# Patient Record
Sex: Female | Born: 1950
Health system: Southern US, Community
[De-identification: ages and names within clinical notes are randomized; demographics above are authoritative.]

## PROBLEM LIST (undated history)

## (undated) DIAGNOSIS — Z972 Presence of dental prosthetic device (complete) (partial): Secondary | ICD-10-CM

## (undated) DIAGNOSIS — C50412 Malignant neoplasm of upper-outer quadrant of left female breast: Secondary | ICD-10-CM

## (undated) DIAGNOSIS — Z98811 Dental restoration status: Secondary | ICD-10-CM

## (undated) DIAGNOSIS — E039 Hypothyroidism, unspecified: Secondary | ICD-10-CM

## (undated) DIAGNOSIS — R7303 Prediabetes: Secondary | ICD-10-CM

## (undated) DIAGNOSIS — S80811A Abrasion, right lower leg, initial encounter: Secondary | ICD-10-CM

## (undated) DIAGNOSIS — H269 Unspecified cataract: Secondary | ICD-10-CM

## (undated) DIAGNOSIS — Z923 Personal history of irradiation: Secondary | ICD-10-CM

## (undated) DIAGNOSIS — E2740 Unspecified adrenocortical insufficiency: Secondary | ICD-10-CM

## (undated) HISTORY — PX: ABDOMINOPLASTY: SHX5355

## (undated) HISTORY — PX: BUNIONECTOMY: SHX129

## (undated) HISTORY — PX: BALLOON SINUPLASTY: SHX5740

## (undated) HISTORY — PX: RHINOPLASTY: SUR1284

## (undated) HISTORY — PX: SEPTOPLASTY: SHX2393

## (undated) HISTORY — DX: Malignant neoplasm of upper-outer quadrant of left female breast: C50.412

## (undated) HISTORY — PX: HAMMER TOE SURGERY: SHX385

---

## 1997-09-25 ENCOUNTER — Other Ambulatory Visit: Admission: RE | Admit: 1997-09-25 | Discharge: 1997-09-25 | Payer: Self-pay | Admitting: Gynecology

## 1998-09-29 ENCOUNTER — Other Ambulatory Visit: Admission: RE | Admit: 1998-09-29 | Discharge: 1998-09-29 | Payer: Self-pay | Admitting: Gynecology

## 1999-09-27 ENCOUNTER — Other Ambulatory Visit: Admission: RE | Admit: 1999-09-27 | Discharge: 1999-09-27 | Payer: Self-pay | Admitting: Gynecology

## 2000-09-26 ENCOUNTER — Other Ambulatory Visit: Admission: RE | Admit: 2000-09-26 | Discharge: 2000-09-26 | Payer: Self-pay | Admitting: Gynecology

## 2001-01-15 ENCOUNTER — Ambulatory Visit (HOSPITAL_COMMUNITY): Admission: RE | Admit: 2001-01-15 | Discharge: 2001-01-15 | Payer: Self-pay | Admitting: Gastroenterology

## 2001-09-26 ENCOUNTER — Other Ambulatory Visit: Admission: RE | Admit: 2001-09-26 | Discharge: 2001-09-26 | Payer: Self-pay | Admitting: Gynecology

## 2002-06-20 ENCOUNTER — Encounter: Admission: RE | Admit: 2002-06-20 | Discharge: 2002-06-20 | Payer: Self-pay | Admitting: Orthopaedic Surgery

## 2002-06-20 ENCOUNTER — Encounter: Payer: Self-pay | Admitting: Orthopaedic Surgery

## 2002-07-04 ENCOUNTER — Encounter: Admission: RE | Admit: 2002-07-04 | Discharge: 2002-07-04 | Payer: Self-pay | Admitting: Orthopaedic Surgery

## 2002-07-04 ENCOUNTER — Encounter: Payer: Self-pay | Admitting: Orthopaedic Surgery

## 2002-07-18 ENCOUNTER — Encounter: Admission: RE | Admit: 2002-07-18 | Discharge: 2002-07-18 | Payer: Self-pay | Admitting: Orthopaedic Surgery

## 2002-07-18 ENCOUNTER — Encounter: Payer: Self-pay | Admitting: Orthopaedic Surgery

## 2003-01-21 ENCOUNTER — Encounter: Admission: RE | Admit: 2003-01-21 | Discharge: 2003-01-21 | Payer: Self-pay | Admitting: Orthopaedic Surgery

## 2003-01-21 ENCOUNTER — Encounter: Payer: Self-pay | Admitting: Orthopaedic Surgery

## 2003-01-21 ENCOUNTER — Encounter: Payer: Self-pay | Admitting: Radiology

## 2003-06-03 ENCOUNTER — Encounter: Admission: RE | Admit: 2003-06-03 | Discharge: 2003-06-03 | Payer: Self-pay | Admitting: Orthopaedic Surgery

## 2004-02-01 ENCOUNTER — Other Ambulatory Visit: Admission: RE | Admit: 2004-02-01 | Discharge: 2004-02-01 | Payer: Self-pay | Admitting: Family Medicine

## 2007-01-30 ENCOUNTER — Ambulatory Visit (HOSPITAL_COMMUNITY): Admission: RE | Admit: 2007-01-30 | Discharge: 2007-01-30 | Payer: Self-pay | Admitting: Orthopedic Surgery

## 2010-05-21 ENCOUNTER — Encounter: Payer: Self-pay | Admitting: Orthopaedic Surgery

## 2010-09-16 NOTE — Op Note (Signed)
. [facility]  Patient:    Christina [name], Christina [name] Visit Number: [id number] MRN: [id number]          Service Type: END Location: ENDO Attending Physician:  Charna Elizabeth Dictated by:   Anselmo Rod, M.D. Proc. Date: 01/15/01 Admit Date:  01/15/2001 Discharge Date: 01/15/2001   CC:         Juan H. Lily Peer, M.D.   Operative Report  DATE OF BIRTH:  1950/10/15  REFERRING PHYSICIAN:  Gaetano Hawthorne. Lily Peer, M.D.  PROCEDURE PERFORMED:  Colonoscopy.  ENDOSCOPIST:  Anselmo Rod, M.D.  INSTRUMENT USED:  Olympus video colonoscope.  INDICATIONS FOR PROCEDURE:  Guaiac positive stool and a family history of colitis in a 60 year old white female rule out colonic polyps, masses, hemorrhoids, etc.  PREPROCEDURE PREPARATION:  Informed consent was procured from the patient. The patient was fasted for eight hours prior to the procedure and prepped with a bottle of magnesium citrate and a gallon of NuLytely the night prior to the procedure.  PREPROCEDURE PHYSICAL:  The patient had stable vital signs.  Neck supple. Chest clear to auscultation.  S1, S2 regular.  Abdomen soft with normal abdominal bowel sounds.  DESCRIPTION OF PROCEDURE:  The patient was placed in the left lateral decubitus position and sedated with 100 mg of Demerol and 10 mg of Versed intravenously.  Once the patient was adequately sedated and maintained on low-flow oxygen and continuous cardiac monitoring, the Olympus video colonoscope was advanced from the rectum to the cecum without difficulty. The entire colonic mucosa up to the cecum and terminal ileum appeared healthy. There were no erosions, ulcerations, masses, polyps or diverticula seen, Small internal hemorrhoids were appreciated on retroflex view.  IMPRESSION: 1. Normal colonoscopy up to terminal ileum. 2. Small nonbleeding internal hemorrhoids.  RECOMMENDATIONS:  Outpatient follow-up is advised for repeat guaiac  testing. Further recommendations will be made at that time.  Dictated by:   Anselmo Rod, M.D. Attending Physician:  Charna Elizabeth DD:  01/15/01 TD:  01/16/01 Job: [id number]

## 2012-11-28 ENCOUNTER — Emergency Department (HOSPITAL_COMMUNITY)
Admission: EM | Admit: 2012-11-28 | Discharge: 2012-11-28 | Disposition: A | Discharge status: Home / Self Care | Payer: Managed Care, Other (non HMO) | Attending: Emergency Medicine | Admitting: Emergency Medicine

## 2012-11-28 ENCOUNTER — Encounter (HOSPITAL_COMMUNITY): Payer: Self-pay | Admitting: *Deleted

## 2012-11-28 DIAGNOSIS — E079 Disorder of thyroid, unspecified: Secondary | ICD-10-CM | POA: Insufficient documentation

## 2012-11-28 DIAGNOSIS — IMO0001 Reserved for inherently not codable concepts without codable children: Secondary | ICD-10-CM | POA: Insufficient documentation

## 2012-11-28 DIAGNOSIS — R238 Other skin changes: Secondary | ICD-10-CM | POA: Insufficient documentation

## 2012-11-28 DIAGNOSIS — Y929 Unspecified place or not applicable: Secondary | ICD-10-CM | POA: Insufficient documentation

## 2012-11-28 DIAGNOSIS — Y9389 Activity, other specified: Secondary | ICD-10-CM | POA: Insufficient documentation

## 2012-11-28 DIAGNOSIS — Z79899 Other long term (current) drug therapy: Secondary | ICD-10-CM | POA: Insufficient documentation

## 2012-11-28 DIAGNOSIS — M7989 Other specified soft tissue disorders: Secondary | ICD-10-CM | POA: Insufficient documentation

## 2012-11-28 DIAGNOSIS — L089 Local infection of the skin and subcutaneous tissue, unspecified: Secondary | ICD-10-CM

## 2012-11-28 DIAGNOSIS — Z862 Personal history of diseases of the blood and blood-forming organs and certain disorders involving the immune mechanism: Secondary | ICD-10-CM | POA: Insufficient documentation

## 2012-11-28 DIAGNOSIS — S61409A Unspecified open wound of unspecified hand, initial encounter: Secondary | ICD-10-CM | POA: Insufficient documentation

## 2012-11-28 DIAGNOSIS — Z8639 Personal history of other endocrine, nutritional and metabolic disease: Secondary | ICD-10-CM | POA: Insufficient documentation

## 2012-11-28 HISTORY — DX: Unspecified adrenocortical insufficiency: E27.40

## 2012-11-28 MED ORDER — SODIUM CHLORIDE 0.9 % IV SOLN
Freq: Once | INTRAVENOUS | Status: AC
  Administered 2012-11-28: 09:00:00 via INTRAVENOUS

## 2012-11-28 MED ORDER — SODIUM CHLORIDE 0.9 % IV SOLN
3.0000 g | Freq: Once | INTRAVENOUS | Status: AC
  Administered 2012-11-28: 3 g via INTRAVENOUS
  Filled 2012-11-28: qty 3

## 2012-11-28 NOTE — ED Notes (Signed)
Pt reports cat bite x 2 days ago.  Pt reports she was playing with her cat and bit her in her R hand.  Pt reports R arm did not start swelling until after lunch yesterday.  Swelling, redness and warmth noted to R arm.  Pt denies unknown fever at this time.

## 2012-11-28 NOTE — ED Provider Notes (Signed)
CSN: [id number]     Arrival date & time 11/28/12  0808 History     First MD Initiated Contact with Patient 11/28/12 [phone]     Chief Complaint  Patient presents with  . Animal Bite   (Consider location/radiation/quality/duration/timing/severity/associated sxs/prior Treatment) Patient is a 62 y.o. female presenting with animal bite. The history is provided by the patient.  Animal Bite Associated symptoms: no fever and no rash   pt c/o cat bite to dorsum right hand 2 days ago at home. States indoor cats, otherwise acting healthy/normal. She was playing w then. Puncture wound just distal to wrist on dorsum hand. Since then noted increased swelling and spreading redness. No nv. No fever or chills. Went to urgent care yesterday, given rx augmentin, since then say ?sl worse or no better. States otherwise recent health at baseline. No hx diabetes. Pt has had tetanus imm within past 2-3 yrs.     Past Medical History  Diagnosis Date  . Thyroid disease   . Adrenal insufficiency    History reviewed. No pertinent past surgical history. No family history on file. History  Substance Use Topics  . Smoking status: Never Smoker   . Smokeless tobacco: Not on file  . Alcohol Use: Yes     Comment: occa   OB History   Grav Para Term Preterm Abortions TAB SAB Ect Mult Living                 Review of Systems  Constitutional: Negative for fever and chills.  Gastrointestinal: Negative for nausea and vomiting.  Musculoskeletal: Negative for back pain.  Skin: Positive for wound. Negative for rash.    Allergies  Sulfa antibiotics  Home Medications   Current Outpatient Rx  Name  Route  Sig  Dispense  Refill  . amoxicillin-clavulanate (AUGMENTIN) 875-125 MG per tablet   Oral   Take 1 tablet by mouth 2 (two) times daily. For 10 days         . cetirizine (ZYRTEC) 10 MG tablet   Oral   Take 10 mg by mouth daily.         Marland Kitchen liothyronine (CYTOMEL) 5 MCG tablet   Oral   Take 5 mcg by mouth  daily.         . medroxyPROGESTERone (PROVERA) 10 MG tablet   Oral   Take 10 mg by mouth daily.         . montelukast (SINGULAIR) 10 MG tablet   Oral   Take 10 mg by mouth at bedtime.         Marland Kitchen oxyCODONE-acetaminophen (PERCOCET/ROXICET) 5-325 MG per tablet   Oral   Take 1 tablet by mouth every 4 (four) hours as needed for pain.         . pseudoephedrine (SUDAFED) 30 MG tablet   Oral   Take 30-60 mg by mouth every 4 (four) hours as needed for congestion.          BP 111/60  Pulse 96  Temp(Src) 97.4 F (36.3 C) (Oral)  Resp 16  SpO2 100% Physical Exam  Nursing note and vitals reviewed. Constitutional: She appears well-developed and well-nourished. No distress.  Eyes: Conjunctivae are normal. No scleral icterus.  Neck: Neck supple. No tracheal deviation present.  Cardiovascular: Normal rate.   Pulmonary/Chest: Effort normal. No respiratory distress.  Abdominal: Normal appearance.  Musculoskeletal:  Swelling and redness to dorsum right hand extending just proximal to wrist. Sl clear drainage from puncture wound dorsum hand. No lymphangitis.  No l/a.    Neurological: She is alert.  Right hand nvi.   Skin: Skin is warm and dry. No rash noted.  Psychiatric: She has a normal mood and affect.    ED Course   Procedures (including critical care time)    MDM  Iv ns. unasyn iv.  Discussed pt with Dr Melvyn Novas, on call for hand, he will see in office now.  Reviewed nursing notes and prior charts for additional history.    Suzi Roots, MD 11/28/12 [phone]

## 2012-11-28 NOTE — ED Notes (Signed)
MD at bedside. steinl  

## 2013-01-17 DIAGNOSIS — J0191 Acute recurrent sinusitis, unspecified: Secondary | ICD-10-CM | POA: Insufficient documentation

## 2013-03-17 ENCOUNTER — Ambulatory Visit (INDEPENDENT_AMBULATORY_CARE_PROVIDER_SITE_OTHER): Payer: Managed Care, Other (non HMO)

## 2013-03-17 ENCOUNTER — Ambulatory Visit (INDEPENDENT_AMBULATORY_CARE_PROVIDER_SITE_OTHER): Payer: Managed Care, Other (non HMO) | Admitting: Podiatry

## 2013-03-17 DIAGNOSIS — M779 Enthesopathy, unspecified: Secondary | ICD-10-CM

## 2013-03-17 DIAGNOSIS — R52 Pain, unspecified: Secondary | ICD-10-CM

## 2013-03-17 DIAGNOSIS — M21611 Bunion of right foot: Secondary | ICD-10-CM

## 2013-03-17 DIAGNOSIS — M21619 Bunion of unspecified foot: Secondary | ICD-10-CM

## 2013-03-17 MED ORDER — TRIAMCINOLONE ACETONIDE 10 MG/ML IJ SUSP
5.0000 mg | Freq: Once | INTRAMUSCULAR | Status: AC
  Administered 2013-03-17: 5 mg via INTRA_ARTICULAR

## 2013-03-17 NOTE — Progress Notes (Signed)
N- SORE L-RT FOOT  D-2 MONTHS O-SLOWLY C-BETTER A-SHOES T-TRIM

## 2013-03-17 NOTE — Progress Notes (Signed)
N-SORE L-LT FOOT GREAT TOENAIL D-2 MONTHS O-SLOWLY C-WORSE A- PRESSURE T-[name]

## 2013-03-19 NOTE — Progress Notes (Signed)
Subjective:     Patient ID: Christina [name], female   DOB: 1950-11-15, 62 y.o.   MRN: [id number]  Foot Pain  Toe Pain    patient states I'm having pain in both feet with inability to walk comfortably on the outside of his right foot and my left big toe has been bothering me. States it's been going on for about a 6 months and getting gradually worse over the last 2 months   Review of Systems  All other systems reviewed and are negative.       Objective:   Physical Exam  Nursing note and vitals reviewed. Constitutional: She is oriented to person, place, and time.  Cardiovascular: Intact distal pulses.   Musculoskeletal: Normal range of motion.  Neurological: She is oriented to person, place, and time.  Skin: Skin is warm.   neurovascular status intact and muscle strength adequate. I noted some 5 there is an inflamed lesion it's painful when pressed with a loose intact core and fluid buildup. 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 At this time I did an injection of the capsule fifth MPJ 3 mg dexamethasone 5 g Xylocaine debrided the lesion and applied padding to the left hallux. Will be seen back when symptomatic

## 2013-05-08 ENCOUNTER — Ambulatory Visit: Payer: Managed Care, Other (non HMO) | Admitting: Podiatry

## 2013-05-22 ENCOUNTER — Encounter: Payer: Self-pay | Admitting: Podiatry

## 2013-05-22 ENCOUNTER — Ambulatory Visit (INDEPENDENT_AMBULATORY_CARE_PROVIDER_SITE_OTHER): Payer: Managed Care, Other (non HMO) | Admitting: Podiatry

## 2013-05-22 VITALS — BP 127/69 | HR 82 | Resp 16

## 2013-05-22 DIAGNOSIS — L03119 Cellulitis of unspecified part of limb: Secondary | ICD-10-CM

## 2013-05-22 DIAGNOSIS — L02619 Cutaneous abscess of unspecified foot: Secondary | ICD-10-CM

## 2013-05-22 DIAGNOSIS — M21619 Bunion of unspecified foot: Secondary | ICD-10-CM

## 2013-05-22 MED ORDER — CEPHALEXIN 500 MG PO CAPS: 500.0000 mg | ORAL_CAPSULE | Freq: Two times a day (BID) | ORAL | Status: DC

## 2013-05-22 NOTE — Progress Notes (Signed)
Subjective:     Patient ID: Christina [name], female   DOB: 01/29/1951, 63 y.o.   MRN: [id number]  HPI patient states that her foot improved for several months but over the last few weeks has become painful again. Points to the outside of the right foot around the fifth metatarsal head where there is a small opening and shoe gear is very irritative for her to wear   Review of Systems     Objective:   Physical Exam Neurovascular status intact with no health history changes noted and redness and inflammation around the fifth metatarsal head right with a small approximate 3 x 3 mm opening on the lateral side of the fifth metatarsal. No proximal edema erythema or drainage noted    Assessment:     Tailor's bunion deformity with inflammation of the persistent nature and small cracking skin which may have localized infection    Plan:     Patient is due to go to [place] in 2 weeks and we are going to try to just get her better prior to that with consideration for surgery when she returns from her trip. Today I did apply Iodosorb and cushioning to the area along with padding to reduce all stress and I place her on cephalexin 500 mg twice a day for 10 days with return prior to leaving for [place] and we will plan to fix the bunion after she returns

## 2013-05-27 ENCOUNTER — Telehealth: Payer: Self-pay | Admitting: *Deleted

## 2013-05-27 NOTE — Telephone Encounter (Signed)
Pt states corn area is tender with a hard cone in the center.  I told pt to continue the antibiotic, use epsom salt soaks and pad off until Monday's appt.  Pt states it does feel better after the soaks, but Neosporin makes it look worse.

## 2013-06-02 ENCOUNTER — Ambulatory Visit (INDEPENDENT_AMBULATORY_CARE_PROVIDER_SITE_OTHER): Payer: Managed Care, Other (non HMO) | Admitting: Podiatry

## 2013-06-02 ENCOUNTER — Telehealth: Payer: Self-pay | Admitting: *Deleted

## 2013-06-02 ENCOUNTER — Ambulatory Visit (INDEPENDENT_AMBULATORY_CARE_PROVIDER_SITE_OTHER): Payer: Managed Care, Other (non HMO)

## 2013-06-02 ENCOUNTER — Encounter: Payer: Self-pay | Admitting: Podiatry

## 2013-06-02 VITALS — BP 124/72 | HR 80 | Resp 16 | Ht 63.0 in | Wt 106.0 lb

## 2013-06-02 DIAGNOSIS — R52 Pain, unspecified: Secondary | ICD-10-CM

## 2013-06-02 DIAGNOSIS — M21619 Bunion of unspecified foot: Secondary | ICD-10-CM

## 2013-06-02 NOTE — Progress Notes (Signed)
Pt states the right foot is painful and very prominent, and she wants it off.

## 2013-06-02 NOTE — Progress Notes (Signed)
Subjective:     Patient ID: Christina [name], female   DOB: 1951-01-17, 63 y.o.   MRN: [id number]  HPI patient presents stating I am ready to get this right foot fixed. States she has not had any drainage or swelling around the fifth metatarsal head but it continues to be sore with any type of shoe gear   Review of Systems     Objective:   Physical Exam Neurovascular status intact with no health history changes noted and irritation around the fifth metatarsal head right with a small crusted area with no drainage or odor noted    Assessment:     Chronic tailor's bunion deformity right that remains painful with any type of pressure    Plan:     We x-rayed and reviewed and recommended metatarsal osteotomy fifth right. Allowed patient to read consent form explaining all possible complications and everything as listed in the consent form that can occur with a procedure such as this. She understands the told recovery takes about 6 months and we can not get any long-term guarantees as far as healing or correction or reduction of pressure. Today cam walker dispensed with all instructions on usage and patient signed consent form

## 2013-06-02 NOTE — Telephone Encounter (Signed)
Pt states would like 06/12/2013 surgery day.  I confirmed the date with the pt and informed it would be about 1230pm and be there 1 hour prior.  Pt states she's only having local anesthesia does she have to be without food after midnight.  I told her to ask the [facility] nurse that calls 24 - 48 hours before her surgery.

## 2013-06-03 ENCOUNTER — Telehealth: Payer: Self-pay | Admitting: *Deleted

## 2013-06-03 NOTE — Telephone Encounter (Signed)
Informed pt she could eat a light snack about 4 hours before surgery, since having only local anesthesia pr Dr Paulla Dolly.

## 2013-06-03 NOTE — Telephone Encounter (Signed)
Dr Paulla Dolly stated pt is having local anesthesia only, so can have a light snack.  Recommendation called to pt.

## 2013-06-03 NOTE — Telephone Encounter (Signed)
She will be fine to eat prior to surgery

## 2013-06-12 ENCOUNTER — Encounter: Payer: Self-pay | Admitting: Podiatry

## 2013-06-12 DIAGNOSIS — M21619 Bunion of unspecified foot: Secondary | ICD-10-CM

## 2013-06-17 NOTE — Progress Notes (Signed)
1) Metatarsal Osteotomy with screw fixation 5th met right foot

## 2013-06-19 ENCOUNTER — Ambulatory Visit (INDEPENDENT_AMBULATORY_CARE_PROVIDER_SITE_OTHER): Payer: Managed Care, Other (non HMO) | Admitting: Podiatry

## 2013-06-19 ENCOUNTER — Ambulatory Visit (INDEPENDENT_AMBULATORY_CARE_PROVIDER_SITE_OTHER): Payer: Managed Care, Other (non HMO)

## 2013-06-19 ENCOUNTER — Encounter: Payer: Self-pay | Admitting: Podiatry

## 2013-06-19 VITALS — BP 131/68 | HR 78 | Resp 12

## 2013-06-19 DIAGNOSIS — Z9889 Other specified postprocedural states: Secondary | ICD-10-CM

## 2013-06-19 DIAGNOSIS — M21619 Bunion of unspecified foot: Secondary | ICD-10-CM

## 2013-06-19 NOTE — Progress Notes (Signed)
Subjective:     Patient ID: Christina [name], female   DOB: 06/23/1950, 62 y.o.   MRN: [id number]  HPI patient presents stating I'm doing fine on my right foot and I do admit I walk without my boot for periods of time   Review of Systems     Objective:   Physical Exam Neurovascular status intact with negative Homans sign noted and well-healing surgical site fifth metatarsal right foot incision site is healing well with wound edges coalescing well and no drainage noted    Assessment:     Tailor's bunion deformity right which is healing well 8 days post sugery    Plan:     Reviewed x-rays and explained the importance of immobilization and reapplied sterile dressing. Reappoint 3 weeks and less need to be seen earlier

## 2013-06-26 ENCOUNTER — Telehealth: Payer: Self-pay | Admitting: *Deleted

## 2013-06-26 NOTE — Telephone Encounter (Signed)
SCHEDULED FOR Monday @ 10:45AM

## 2013-06-26 NOTE — Telephone Encounter (Signed)
Pt states had been doing well, then yesterday had a sharp pain in one spot at the end of the day.  Pt states went back into the surgical boot and felt better.  Pt states may have twisted foot while walking the dog also.  I told pt to remain in the surgical boot, and I would refer to schedulers for a 06/30/2013 appt with Dr Paulla Dolly.  Pt agreed.

## 2013-06-27 ENCOUNTER — Telehealth: Payer: Self-pay | Admitting: *Deleted

## 2013-06-27 NOTE — Telephone Encounter (Signed)
Pt states she's been in the surgical boot, and then a regular shoe and feel okay, asked if needed to keep the 07/02/2103 appt with Dr Paulla Dolly.  I left a message to keep the appt, but if all was well call an cancel early Monday morning.

## 2013-06-30 ENCOUNTER — Ambulatory Visit (INDEPENDENT_AMBULATORY_CARE_PROVIDER_SITE_OTHER): Payer: Managed Care, Other (non HMO)

## 2013-06-30 ENCOUNTER — Encounter: Payer: Self-pay | Admitting: Podiatry

## 2013-06-30 ENCOUNTER — Ambulatory Visit (INDEPENDENT_AMBULATORY_CARE_PROVIDER_SITE_OTHER): Payer: Managed Care, Other (non HMO) | Admitting: Podiatry

## 2013-06-30 VITALS — BP 123/65 | HR 78 | Resp 16

## 2013-06-30 DIAGNOSIS — Z9889 Other specified postprocedural states: Secondary | ICD-10-CM

## 2013-06-30 DIAGNOSIS — M21619 Bunion of unspecified foot: Secondary | ICD-10-CM

## 2013-06-30 NOTE — Progress Notes (Signed)
Subjective:     Patient ID: Christina [name], female   DOB: 01-13-1951, 63 y.o.   MRN: [id number]  HPI patient points to right foot stating he was hurting her quite a bit over the weekend and she was worried that she may have done something to it. Patient admits that she's been quite active on her right foot during the healing process and is now 4 weeks after having met osteotomy fifth right   Review of Systems     Objective:   Physical Exam Neurovascular status is intact with mild edema around the fifth metatarsal right indicating excessive activity. No erythema no drainage or breakdown of incision site    Assessment:     Patient's doing well with probable excessive bike activity with possible mild motion of the bone    Plan:     X-rays reviewed and advised on continued immobilization being careful with this and gradual healing should occur. It will probably swell for another 4-8 weeks and she will be careful during that period and we'll not go without some form of shoe or boot. Reappoint to check in 4 weeks or earlier if any issues should occur

## 2013-07-10 ENCOUNTER — Encounter: Payer: Managed Care, Other (non HMO) | Admitting: Podiatry

## 2013-07-24 ENCOUNTER — Ambulatory Visit (INDEPENDENT_AMBULATORY_CARE_PROVIDER_SITE_OTHER): Payer: Managed Care, Other (non HMO) | Admitting: Podiatry

## 2013-07-24 ENCOUNTER — Encounter: Payer: Self-pay | Admitting: Podiatry

## 2013-07-24 ENCOUNTER — Ambulatory Visit (INDEPENDENT_AMBULATORY_CARE_PROVIDER_SITE_OTHER): Payer: Managed Care, Other (non HMO)

## 2013-07-24 DIAGNOSIS — M21619 Bunion of unspecified foot: Secondary | ICD-10-CM

## 2013-07-24 DIAGNOSIS — Z9889 Other specified postprocedural states: Secondary | ICD-10-CM

## 2013-07-24 NOTE — Progress Notes (Signed)
Subjective:     Patient ID: Christina [name], female   DOB: January 18, 1951, 63 y.o.   MRN: [id number]  HPI patient states my right foot is doing okay with some swelling and achiness at the end of the day outside into active but I am riding horses and doing everything I want   Review of Systems     Objective:   Physical Exam Neurovascular status intact with well-healing surgical site right fifth metatarsal with mild edema noted    Assessment:     Healing well with mild movement of the fifth metatarsal bone    Plan:     H&P and x-rays reviewed. I'm very happy that the skin has healed on the lateral side of the fifth metatarsal and the bone will heal gradually so she is told to be careful and achiness is normal at this.. Reappoint 6 weeks earlier if necessary

## 2013-08-11 ENCOUNTER — Ambulatory Visit (INDEPENDENT_AMBULATORY_CARE_PROVIDER_SITE_OTHER): Payer: Managed Care, Other (non HMO)

## 2013-08-11 ENCOUNTER — Telehealth: Payer: Self-pay | Admitting: *Deleted

## 2013-08-11 ENCOUNTER — Encounter: Payer: Self-pay | Admitting: Podiatry

## 2013-08-11 ENCOUNTER — Ambulatory Visit (INDEPENDENT_AMBULATORY_CARE_PROVIDER_SITE_OTHER): Payer: Managed Care, Other (non HMO) | Admitting: Podiatry

## 2013-08-11 ENCOUNTER — Ambulatory Visit: Payer: Managed Care, Other (non HMO) | Admitting: Podiatry

## 2013-08-11 VITALS — BP 140/80 | HR 96 | Resp 12

## 2013-08-11 DIAGNOSIS — Z9889 Other specified postprocedural states: Secondary | ICD-10-CM

## 2013-08-11 DIAGNOSIS — M21619 Bunion of unspecified foot: Secondary | ICD-10-CM

## 2013-08-11 NOTE — Telephone Encounter (Signed)
He did surgery about 2 months ago.  I hit it against a piece of furniture trying to share the same space with my dog.  The skin broke open a little.  What does he want me to do?  It was oozing.  I returned her call and informed her she will need to be seen.  She asked if she could come today in the next few minutes.  I transferred her to a scheduler.

## 2013-08-11 NOTE — Progress Notes (Signed)
Subjective:     Patient ID: Christina [name], female   DOB: 06/20/1950, 63 y.o.   MRN: [id number]  HPI patient states she injured her right foot 4 days ago and that it's been a little bit irritated and she wanted to make sure it's not infected and she didn't do anything back   Review of Systems     Objective:   Physical Exam Neurovascular status is intact with incision site right fifth metatarsal healing well with more plantar a small punctate area with no erythema edema or drainage noted    Assessment:     Trauma around the right fifth metatarsal head that appears to be localized in nature    Plan:     Instructed on soaks and padding and Neosporin usage and if any redness swelling or drainage were to occur to let us know immediately reviewed x-rays with patient

## 2013-09-04 ENCOUNTER — Encounter: Payer: Self-pay | Admitting: Podiatry

## 2013-09-04 ENCOUNTER — Ambulatory Visit (INDEPENDENT_AMBULATORY_CARE_PROVIDER_SITE_OTHER): Payer: Managed Care, Other (non HMO)

## 2013-09-04 ENCOUNTER — Ambulatory Visit (INDEPENDENT_AMBULATORY_CARE_PROVIDER_SITE_OTHER): Payer: Self-pay | Admitting: Podiatry

## 2013-09-04 VITALS — BP 118/67 | HR 78 | Resp 15 | Ht 63.5 in | Wt 107.0 lb

## 2013-09-04 DIAGNOSIS — M21619 Bunion of unspecified foot: Secondary | ICD-10-CM

## 2013-09-04 NOTE — Progress Notes (Signed)
Subjective:     Patient ID: Christina [name], female   DOB: [birth year], 63 y.o.   MRN: [id number]  HPI patient states have been very active on my foot but I cannot wear closed in shoes comfortably and while and able to walk 3 miles at a time I cannot wear what I want currently. Patient that she's been very hard on her foot and has had several incidences of trauma to the foot itself but at this time is able to walk comfortably just not in all of her shoes   Review of Systems     Objective:   Physical Exam Neurovascular status found to be intact with edema around the fifth metatarsal right that is mild in nature with minimal discomfort when I pressed the fifth metatarsal head itself    Assessment:     Noncompliant patient who has quite a bit of trauma around fifth metatarsal head secondary to previous osteotomy surgery    Plan:     Reviewed H&P and x-ray. Explained that there is quite a bit of healing to occur and there is no guarantee that this will ultimately he'll but I'm still hopeful that over time it will fill in and completely heal across the osteotomy site. I did discuss at one point that we may need to consider bone stimulator and that also it's possible at one point we will need to go back and clean the area re fixate and completely immobilize. Patient states that she is not interested in this currently as she is very busy over the summer and she is okay with the way her foot feels currently. I did offer her the consideration of surgery at this time she denies wanting it and wants to just watch and see whether healing will occur and if something needs to be done it will be done in the fall

## 2013-10-23 ENCOUNTER — Ambulatory Visit (INDEPENDENT_AMBULATORY_CARE_PROVIDER_SITE_OTHER): Payer: Managed Care, Other (non HMO)

## 2013-10-23 ENCOUNTER — Encounter: Payer: Self-pay | Admitting: Podiatry

## 2013-10-23 ENCOUNTER — Ambulatory Visit (INDEPENDENT_AMBULATORY_CARE_PROVIDER_SITE_OTHER): Payer: Managed Care, Other (non HMO) | Admitting: Podiatry

## 2013-10-23 VITALS — BP 104/70 | HR 75 | Resp 15 | Ht 63.5 in | Wt 103.0 lb

## 2013-10-23 DIAGNOSIS — M21611 Bunion of right foot: Secondary | ICD-10-CM

## 2013-10-23 DIAGNOSIS — M21619 Bunion of unspecified foot: Secondary | ICD-10-CM

## 2013-10-23 DIAGNOSIS — R609 Edema, unspecified: Secondary | ICD-10-CM

## 2013-10-23 NOTE — Progress Notes (Signed)
Subjective:     Patient ID: Christina [name], female   DOB: 1950-06-05, 63 y.o.   MRN: [id number]  HPI patient states she's doing pretty well and is able to walk distances without pain but still [place] has pain in her fifth metatarsal   Review of Systems     Objective:   Physical Exam Neurovascular status intact with incision site is well-healed and mild edema still noted around the fifth metatarsal head    Assessment:     Doing well after having osteotomy surgery right with discomfort still present gradually improve after unfortunately she had Bovie moved in a more medial direction post surgery    Plan:     Reviewed x-rays and continue gradual increase in activity levels and hopeful that this will completely be asymptomatic over the next few months. Did explain possibility for bone stimulator or having to go back and if symptoms were to warrant but at this time she seems to be under good track. Reappoint 8 weeks

## 2014-01-01 ENCOUNTER — Encounter: Payer: Self-pay | Admitting: Podiatry

## 2014-01-01 ENCOUNTER — Ambulatory Visit (INDEPENDENT_AMBULATORY_CARE_PROVIDER_SITE_OTHER): Payer: Managed Care, Other (non HMO) | Admitting: Podiatry

## 2014-01-01 ENCOUNTER — Ambulatory Visit (INDEPENDENT_AMBULATORY_CARE_PROVIDER_SITE_OTHER): Payer: Managed Care, Other (non HMO)

## 2014-01-01 VITALS — BP 107/59 | HR 83 | Resp 16

## 2014-01-01 DIAGNOSIS — M21619 Bunion of unspecified foot: Secondary | ICD-10-CM

## 2014-01-01 DIAGNOSIS — Z9889 Other specified postprocedural states: Secondary | ICD-10-CM

## 2014-01-01 DIAGNOSIS — M21611 Bunion of right foot: Secondary | ICD-10-CM

## 2014-01-01 NOTE — Progress Notes (Signed)
Subjective:     Patient ID: Christina [name], female   DOB: 12/07/1950, 63 y.o.   MRN: [id number]  HPI patient states that I'm doing a lot better with my foot and able to walk distances without pain. Patient states that she lives on the [place] and walks pretty much everyday   Review of Systems     Objective:   Physical Exam Neurovascular status intact with well-healed surgical site right fifth metatarsal with minimal edema and no pain with pressure    Assessment:     Improving fifth metatarsal osteotomy    Plan:     Reviewed x-rays and allow patient to return to normal shoes. Patient will be seen back as needed if any issues should occur

## 2014-05-01 HISTORY — PX: BREAST LUMPECTOMY: SHX2

## 2014-06-15 ENCOUNTER — Ambulatory Visit (INDEPENDENT_AMBULATORY_CARE_PROVIDER_SITE_OTHER): Payer: Managed Care, Other (non HMO) | Admitting: Podiatry

## 2014-06-15 ENCOUNTER — Ambulatory Visit (INDEPENDENT_AMBULATORY_CARE_PROVIDER_SITE_OTHER): Payer: Managed Care, Other (non HMO)

## 2014-06-15 ENCOUNTER — Encounter: Payer: Self-pay | Admitting: Podiatry

## 2014-06-15 VITALS — BP 134/74 | HR 88 | Resp 12

## 2014-06-15 DIAGNOSIS — M21611 Bunion of right foot: Secondary | ICD-10-CM

## 2014-06-15 DIAGNOSIS — Z9889 Other specified postprocedural states: Secondary | ICD-10-CM

## 2014-06-15 DIAGNOSIS — L84 Corns and callosities: Secondary | ICD-10-CM

## 2014-06-15 DIAGNOSIS — M2011 Hallux valgus (acquired), right foot: Secondary | ICD-10-CM

## 2014-06-15 DIAGNOSIS — R52 Pain, unspecified: Secondary | ICD-10-CM

## 2014-06-16 NOTE — Progress Notes (Signed)
Subjective:     Patient ID: Christina [name], female   DOB: [birth year], 64 y.o.   MRN: [id number]  HPI patient presents stating she's developed a little bit of irritation and callus formation on the incision site right fifth metatarsal   Review of Systems     Objective:   Physical Exam Neurovascular status intact no health history changes noted with well-healing surgical site right fifth metatarsal head with a small keratotic lesion with in the incision itself    Assessment:     Small inclusion cyst within the incision site right fifth metatarsal    Plan:     Precautionary x-ray taken debridement of lesion accomplished and applied a small amount of salicylic acid with occlusion. Patient be seen back if symptoms were to persist or recur

## 2014-12-07 ENCOUNTER — Encounter: Payer: Self-pay | Admitting: *Deleted

## 2014-12-07 ENCOUNTER — Telehealth: Payer: Self-pay | Admitting: *Deleted

## 2014-12-07 DIAGNOSIS — C50412 Malignant neoplasm of upper-outer quadrant of left female breast: Secondary | ICD-10-CM

## 2014-12-07 HISTORY — DX: Malignant neoplasm of upper-outer quadrant of left female breast: C50.412

## 2014-12-07 NOTE — Telephone Encounter (Signed)
Confirmed [facility] for 12/09/14 at 0830 .  Instructions and contact information given.

## 2014-12-09 ENCOUNTER — Encounter: Payer: Self-pay | Admitting: Physical Therapy

## 2014-12-09 ENCOUNTER — Ambulatory Visit: Payer: Managed Care, Other (non HMO) | Attending: General Surgery | Admitting: Physical Therapy

## 2014-12-09 ENCOUNTER — Ambulatory Visit: Payer: Managed Care, Other (non HMO)

## 2014-12-09 ENCOUNTER — Encounter: Payer: Self-pay | Admitting: *Deleted

## 2014-12-09 ENCOUNTER — Ambulatory Visit
Admission: RE | Admit: 2014-12-09 | Discharge: 2014-12-09 | Disposition: A | Discharge status: Home / Self Care | Payer: Managed Care, Other (non HMO) | Source: Ambulatory Visit | Attending: Radiation Oncology | Admitting: Radiation Oncology

## 2014-12-09 ENCOUNTER — Encounter: Payer: Self-pay | Admitting: Hematology

## 2014-12-09 ENCOUNTER — Encounter: Payer: Self-pay | Admitting: Skilled Nursing Facility1

## 2014-12-09 ENCOUNTER — Encounter (INDEPENDENT_AMBULATORY_CARE_PROVIDER_SITE_OTHER): Payer: Self-pay

## 2014-12-09 ENCOUNTER — Other Ambulatory Visit: Payer: Self-pay | Admitting: General Surgery

## 2014-12-09 ENCOUNTER — Other Ambulatory Visit (HOSPITAL_BASED_OUTPATIENT_CLINIC_OR_DEPARTMENT_OTHER): Payer: Managed Care, Other (non HMO)

## 2014-12-09 ENCOUNTER — Ambulatory Visit (HOSPITAL_BASED_OUTPATIENT_CLINIC_OR_DEPARTMENT_OTHER): Payer: Managed Care, Other (non HMO) | Admitting: Hematology

## 2014-12-09 VITALS — BP 146/71 | HR 87 | Temp 97.6°F | Resp 18 | Ht 63.5 in | Wt 106.5 lb

## 2014-12-09 DIAGNOSIS — C50412 Malignant neoplasm of upper-outer quadrant of left female breast: Secondary | ICD-10-CM

## 2014-12-09 DIAGNOSIS — C50912 Malignant neoplasm of unspecified site of left female breast: Secondary | ICD-10-CM

## 2014-12-09 DIAGNOSIS — E2740 Unspecified adrenocortical insufficiency: Secondary | ICD-10-CM | POA: Diagnosis not present

## 2014-12-09 DIAGNOSIS — E039 Hypothyroidism, unspecified: Secondary | ICD-10-CM

## 2014-12-09 LAB — CBC WITH DIFFERENTIAL/PLATELET
BASO%: 0.6 % (ref 0.0–2.0)
Basophils Absolute: 0.1 10*3/uL (ref 0.0–0.1)
EOS%: 1 % (ref 0.0–7.0)
Eosinophils Absolute: 0.1 10*3/uL (ref 0.0–0.5)
HCT: 38.7 % (ref 34.8–46.6)
HGB: 12.7 g/dL (ref 11.6–15.9)
LYMPH%: 25.9 % (ref 14.0–49.7)
MCH: 30.5 pg (ref 25.1–34.0)
MCHC: 32.8 g/dL (ref 31.5–36.0)
MCV: 93.1 fL (ref 79.5–101.0)
MONO#: 0.9 10*3/uL (ref 0.1–0.9)
MONO%: 7.7 % (ref 0.0–14.0)
NEUT#: 7.5 10*3/uL — ABNORMAL HIGH (ref 1.5–6.5)
NEUT%: 64.8 % (ref 38.4–76.8)
Platelets: 253 10*3/uL (ref 145–400)
RBC: 4.16 10*6/uL (ref 3.70–5.45)
RDW: 14.3 % (ref 11.2–14.5)
WBC: 11.6 10*3/uL — ABNORMAL HIGH (ref 3.9–10.3)
lymph#: 3 10*3/uL (ref 0.9–3.3)

## 2014-12-09 LAB — COMPREHENSIVE METABOLIC PANEL (CC13)
ALT: 24 U/L (ref 0–55)
AST: 19 U/L (ref 5–34)
Albumin: 3.9 g/dL (ref 3.5–5.0)
Alkaline Phosphatase: 48 U/L (ref 40–150)
Anion Gap: 7 mEq/L (ref 3–11)
BUN: 13.8 mg/dL (ref 7.0–26.0)
CO2: 28 mEq/L (ref 22–29)
Calcium: 9.6 mg/dL (ref 8.4–10.4)
Chloride: 108 mEq/L (ref 98–109)
Creatinine: 0.8 mg/dL (ref 0.6–1.1)
EGFR: 81 mL/min/{1.73_m2} — ABNORMAL LOW (ref >90)
Glucose: 82 mg/dl (ref 70–140)
Potassium: 3.9 mEq/L (ref 3.5–5.1)
Sodium: 143 mEq/L (ref 136–145)
Total Bilirubin: 0.35 mg/dL (ref 0.20–1.20)
Total Protein: 6.1 g/dL — ABNORMAL LOW (ref 6.4–8.3)

## 2014-12-09 NOTE — Progress Notes (Signed)
[place]  [phone] [phone] [place] Note   Patient Care Team: Lollie Sails, MD as PCP - General (Internal Medicine) Stark Klein, MD as Consulting Physician (General Surgery) Truitt Merle, MD as Consulting Physician (Hematology) Thea Silversmith, MD as Consulting Physician (Radiation Oncology) Mauro Kaufmann, RN as Registered Nurse Rockwell [place], RN as Registered Nurse 12/09/2014  CHIEF COMPLAINTS/PURPOSE OF CONSULTATION:  Newly diagnosed left breast cancer   Oncology History   Breast cancer of upper-outer quadrant of left female breast   Staging form: Breast, AJCC 7th Edition     Clinical: No stage assigned - Unsigned     Pathologic: No stage assigned - Unsigned     Pathologic: Stage IA (T1b, N0, cM0) - Unsigned       Breast cancer of upper-outer quadrant of left female breast   12/02/2014 Mammogram a 1cm oval mass at 2:00 o'clock position    12/03/2014 Initial Diagnosis Breast cancer of upper-outer quadrant of left female breast   12/03/2014 Initial Biopsy left breast mass showed G1 invasive ductal carcinoma    12/03/2014 Receptors her2 ER 100% positive, PR 100% positive, HER-2 negative (ratio 1.5, copy number 2.05)    HISTORY OF PRESENTING ILLNESS:  [name] 64 y.o. female is here because of newly diagnosed left breast cancer. She presents to our multidisciplinary breast clinic by herself today.  It was discovered by screening mammogram, she did not have palpable breast mass. she feels well overall. She denies any significant pain, dyspnea, GI symptoms. Her husband had pancreatic cancer and died in 07/13/2007. She lives by herself, retired, but still works full-time, very physically active. She has no children or relatives in this area, but does have friends there is supported.   She has history of hypothyroidism and adrenal insufficiency, she follows up with her primary care physician Dr. Deirdre Pippins, who also felt this  alternative medicine, and she is on multiple supplements, including progesterone, testosterone cream (she rarely uses ), and DHEA, etc. she has been taking progesterone replacement for the past 10 years, which stopped last week.  MEDICAL HISTORY:  Past Medical History  Diagnosis Date  . Thyroid disease   . Adrenal insufficiency   . Breast cancer of upper-outer quadrant of left female breast 12/07/2014    SURGICAL HISTORY: Past Surgical History  Procedure Laterality Date  . Tailor bunion     GYN HISTORY  Menarchal: 10 LMP: 44 Contraceptive: 19 years  HRT: progesterone for the past 10 years, stopped a few weeks ago G0P0:    SOCIAL HISTORY: Social History   Social History  . Marital Status: Married    Spouse Name: [name]  . Number of Children: [name]  . Years of Education: [name]   Occupational History  . Not on file.   Social History Main Topics  . Smoking status: Former Smoker -- 1.00 packs/day for 10 years    Quit date: 05/01/1980  . Smokeless tobacco: Not on file  . Alcohol Use: Yes     Comment: occa  . Drug Use: No  . Sexual Activity: Not on file   Other Topics Concern  . Not on file   Social History Narrative    FAMILY HISTORY: Family History  Problem Relation Age of Onset  . Diabetes Mother   . Diabetes Father   . Cancer Maternal Uncle     pancreatic cancer   . Diabetes Paternal Grandmother   . Diabetes Paternal Grandfather  ALLERGIES:  is allergic to doxycycline and sulfa antibiotics.  MEDICATIONS:  Current Outpatient Prescriptions  Medication Sig Dispense Refill  . acyclovir (ZOVIRAX) 800 MG tablet     . Ascorbic Acid (VITAMIN C) 100 MG tablet Take 1,000 mg by mouth daily.    . Beta Carotene (VITAMIN A) 25000 UNIT capsule Take 25,000 Units by mouth daily.    Marland Kitchen BIOTIN MAXIMUM STRENGTH PO Take 450 mg by mouth daily. ALA 295m Berberine HCL 5075m   . calcium carbonate (OS-CAL) 600 MG TABS tablet Take 800 mg by mouth 2 (two) times daily with a  meal.    . cetirizine (ZYRTEC) 10 MG tablet Take by mouth.    . cholecalciferol (VITAMIN D) 1000 UNITS tablet Take 1,000 Units by mouth daily.    . clonazePAM (KLONOPIN) 1 MG tablet Take by mouth.    . co-enzyme Q-10 30 MG capsule Take 200 mg by mouth 3 (three) times daily.    . Marland KitchenHEA 50 MG CAPS Take 50 mg by mouth daily.    . ergocalciferol (VITAMIN D2) 50000 UNITS capsule Take 50,000 Units by mouth daily.    . Eszopiclone (ESZOPICLONE) 3 MG TABS Take 1.5 mg by mouth.    . Flaxseed, Linseed, (FLAXSEED OIL) 1000 MG CAPS Take 1,000 mg by mouth.    . Marland KitchenuaiFENesin (MUCINEX) 600 MG 12 hr tablet Take 600 mg by mouth 2 (two) times daily.    . Marland Kitcheniothyronine (CYTOMEL) 5 MCG tablet Take 5 mcg by mouth daily.    . magnesium 30 MG tablet Take 300 mg by mouth 2 (two) times daily.    . metFORMIN (GLUCOPHAGE-XR) 500 MG 24 hr tablet TAKE 1/2 TABLET BY MOUTH WITH EVENING MEAL FOR 2 WEEKS THEN INCREASE TO 1 TAB W/ EVENING MEAL DAILY  2  . methylPREDNISolone (MEDROL) 4 MG tablet Take 8 mg by mouth every morning.  3  . montelukast (SINGULAIR) 10 MG tablet Take 10 mg by mouth at bedtime.    . mupirocin ointment (BACTROBAN) 2 % Use 1 inch ribbon twice a day as directed.    . niacin 500 MG tablet Take 500 mg by mouth at bedtime.    . Nutritional Supplements (SYTRINOL PO) Take by mouth daily.    . Marland KitchenVER THE COUNTER MEDICATION Take by mouth daily. Alamax Protect Ala 2504merberine HCL500 mg    . OVER THE COUNTER MEDICATION Take by mouth daily. Nutrient 950 with NAC2    . OVER THE COUNTER MEDICATION Take by mouth daily. LV GB Complex    . OVER THE COUNTER MEDICATION Regenemax or Biosil    . OVER THE COUNTER MEDICATION Take 30 mg by mouth daily. Borotab    . OVER THE COUNTER MEDICATION Take by mouth daily. Lypozyme 1 Cap    . OVER THE COUNTER MEDICATION Take by mouth daily. Cholest Solve    . oxyCODONE-acetaminophen (PERCOCET/ROXICET) 5-325 MG per tablet Take 1 tablet by mouth every 4 (four) hours as needed for pain.      . potassium chloride (MICRO-K) 10 MEQ CR capsule Potassium Chloride 10 MEQ/ ER.  2 tabs by mouth twice daily.    . progesterone (PROMETRIUM) 100 MG capsule Take 70 mg by mouth daily.    . STRONTIUM GLUCONATE-B6-B12-FA PO Take 700 mg by mouth daily.    . tMarland Kitcheniamcinolone (NASACORT) 55 MCG/ACT AERO nasal inhaler Place into the nose.    . VMarland KitchenTAMIN K, PHYTONADIONE, PO Take by mouth.    . zinc gluconate 50 MG tablet Take  50 mg by mouth daily.     No current facility-administered medications for this visit.    REVIEW OF SYSTEMS:   Constitutional: Denies fevers, chills or abnormal night sweats Eyes: Denies blurriness of vision, double vision or watery eyes Ears, nose, mouth, throat, and face: Denies mucositis or sore throat Respiratory: Denies cough, dyspnea or wheezes Cardiovascular: Denies palpitation, chest discomfort or lower extremity swelling Gastrointestinal:  Denies nausea, heartburn or change in bowel habits Skin: Denies abnormal skin rashes Lymphatics: Denies new lymphadenopathy or easy bruising Neurological:Denies numbness, tingling or new weaknesses Behavioral/Psych: Mood is stable, no new changes  All other systems were reviewed with the patient and are negative.  PHYSICAL EXAMINATION: ECOG PERFORMANCE STATUS: 0 - Asymptomatic  Filed Vitals:   12/09/14 0907  BP: 146/71  Pulse: 87  Temp: 97.6 F (36.4 C)  Resp: 18   Filed Weights   12/09/14 0907  Weight: 106 lb 8 oz (48.308 kg)    GENERAL:alert, no distress and comfortable SKIN: skin color, texture, turgor are normal, no rashes or significant lesions EYES: normal, conjunctiva are pink and non-injected, sclera clear OROPHARYNX:no exudate, no erythema and lips, buccal mucosa, and tongue normal  NECK: supple, thyroid normal size, non-tender, without nodularity LYMPH:  no palpable lymphadenopathy in the cervical, axillary or inguinal LUNGS: clear to auscultation and percussion with normal breathing effort HEART: regular  rate & rhythm and no murmurs and no lower extremity edema ABDOMEN:abdomen soft, non-tender and normal bowel sounds Musculoskeletal:no cyanosis of digits and no clubbing  PSYCH: alert & oriented x 3 with fluent speech NEURO: no focal motor/sensory deficits Breasts: Breast inspection showed them to be symmetrical with no nipple discharge. (+) Diffuse skin bruises in the left breast biopsy site. The left breast upper outer quadrant feels lumpy, but no discrete mass. Palpation of the breasts and axilla revealed no obvious mass that I could appreciate.   LABORATORY DATA:  I have reviewed the data as listed Lab Results  Component Value Date   WBC 11.6* 12/09/2014   HGB 12.7 12/09/2014   HCT 38.7 12/09/2014   MCV 93.1 12/09/2014   PLT 253 12/09/2014    Recent Labs  12/09/14 0832  NA 143  K 3.9  CO2 28  GLUCOSE 82  BUN 13.8  CREATININE 0.8  CALCIUM 9.6  PROT 6.1*  ALBUMIN 3.9  AST 19  ALT 24  ALKPHOS 48  BILITOT 0.35   PATHOLOGY REPORT:  Diagnosis 12/03/2014  Breast, left, needle core biopsy -Invasive ductal carcinoma, see comments next  Microscopic comment Although grade of tumor is best assessed at resection, with this biopsies, the invasive tumor is grade 1. Breast prognostic studies are pending and will be reported in an addendum.  Per Dr. Saralyn Pilar in the morning tumor conference ER 100% positive, PR 100% positive, Ki-67 5%, HER-2 negative with racial 1.5 and copy number 2.05.   RADIOGRAPHIC STUDIES: I have personally reviewed the radiological images as listed and agreed with the findings in the report. No results found.  ASSESSMENT & PLAN: 64 year old Caucasian female, with past medical history of hypothyroidism, adrenal deficiency, who was found to have a left breast cancer on screening mammogram  1. Left breast invasive ductal carcinoma, cT1bN0M0, stage IA, ER+/PR+, HER2 (-) -I reviewed her mammograms, ultrasound, and the biopsy findings with her in great  details. -Giving the early stage breast cancer, strong ER/PR positivity, low grade tumor, she is likely will do very well.  -She was seen by surgeon Dr. Barry Dienes today, likely can  have lumpectomy and sentinel lymph node biopsy in the next few weeks. -We discussed the small risk of cancer recurrence after her surgery, and options of adjuvant therapy to reduce the risk -Giving the strong ER/PR positivity, and her postmenopausal status, I recommend aromatase inhibitor as adjuvant endocrine therapy to reduce the risk of cancer recurrence. -If the tumor is more than 1 cm on the final surgical path, I would recommend Oncotype DX test to further predict the risk of cancer recurrence, and need for adjuvant chemotherapy. She agrees with the plan -She was also seen by a radiation oncologist Dr. Corlis Hove worse today, adjuvant radiation was discussed and offered.  Plan: -She will likely have lumpectomy and sentinel lymph node biopsy in the next few weeks -I'll review her surgical pathology results, and decide about Oncotype DX test. I'll see her 3-4 weeks after her surgery to review the test results, sooner if her surgical path reveals more advance disease.    All questions were answered. The patient knows to call the clinic with any problems, questions or concerns. I spent 55 minutes counseling the patient face to face. The total time spent in the appointment was 60 minutes and more than 50% was on counseling.     Truitt Merle, MD 12/09/2014 11:59 AM

## 2014-12-09 NOTE — Progress Notes (Signed)
Pt states she received packet in the mail and had with her.

## 2014-12-09 NOTE — Progress Notes (Signed)
Checked in breast clinic patient. Pt signed release forms and made copay of $25 via [employer].receipts given. Pt given my card for any financial concerns.

## 2014-12-09 NOTE — Progress Notes (Signed)
Subjective:     Patient ID: Christina [name], female   DOB: [birth year], 65 y.o.   MRN: [id number]  HPI   Review of Systems     Objective:   Physical Exam For the patient to understand and be given the tools to implement a healthy plant based diet during their cancer diagnosis.     Assessment:     Pt was agitated and needed to get to work. Pt is currently taking 25 supplements. Pts ht 5'3'', wt 106 pounds, and BMI 18.6. Pts total protein is low at 6.1 and her GFR is low at 81 and her WBC is low at 11.6.     Plan:     Dietitian gave the pt the folder of informattion and advised the pt to contact [place], RD, LDN if she had any questions.

## 2014-12-09 NOTE — Therapy (Signed)
[place], [place], [address] Phone: [phone]   Fax:  [phone]  Physical Therapy Evaluation  Patient Details  Name: Christina [name] MRN: [id number] Date of Birth: [birth year] Referring Provider:  Stark Klein, MD  Encounter Date: 12/09/2014      PT End of Session - 12/09/14 1255    Visit Number 1   Number of Visits 1   PT Start Time 0940   PT Stop Time 0955  Also saw her from 1125-1135   PT Time Calculation (min) 15 min   Activity Tolerance Patient tolerated treatment well   Behavior During Therapy [facility] for tasks assessed/performed      Past Medical History  Diagnosis Date  . Thyroid disease   . Adrenal insufficiency   . Breast cancer of upper-outer quadrant of left female breast 12/07/2014    Past Surgical History  Procedure Laterality Date  . Tailor bunion      There were no vitals filed for this visit.  Visit Diagnosis:  Breast cancer, left - Plan: PT plan of care cert/re-cert      Subjective Assessment - 12/09/14 1245    Subjective Patient was seen today for a baseline assessment of her newly diagnosed left breast cancer.   Pertinent History Patient was diagnosed with left ER/PR positive, HER2 negative breast cancer measuring 1 cm in size.   Patient Stated Goals Reduce lymphedema risk and learn post op shoulder ROM HEP   Currently in Pain? No/denies            [facility] PT Assessment - 12/09/14 0001    Assessment   Medical Diagnosis Left breast cancer   Onset Date/Surgical Date 11/26/14   Hand Dominance Right   Prior Therapy none   Precautions   Precautions Other (comment)  Active breast cancer   Restrictions   Weight Bearing Restrictions No   Balance Screen   Has the patient fallen in the past 6 months No   Has the patient had a decrease in activity level because of a fear of falling?  No   Is the patient reluctant to leave their home because of a fear of falling?   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 [place] rides horses 1x/wk, walks 15-30 min daily   Cognition   Overall Cognitive Status Within Functional Limits for tasks assessed   Posture/Postural Control   Posture/Postural Control No significant limitations   ROM / Strength   AROM / PROM / Strength AROM;Strength   AROM   AROM Assessment Site Shoulder   Right/Left Shoulder Right;Left   Right Shoulder Extension 43 Degrees   Right Shoulder Flexion 155 Degrees   Right Shoulder ABduction 177 Degrees   Right Shoulder Internal Rotation 50 Degrees   Right Shoulder External Rotation 88 Degrees   Left Shoulder Extension 51 Degrees   Left Shoulder Flexion 160 Degrees   Left Shoulder ABduction 175 Degrees   Left Shoulder Internal Rotation 48 Degrees   Left Shoulder External Rotation 82 Degrees   Strength   Overall Strength Within functional limits for tasks performed           LYMPHEDEMA/ONCOLOGY QUESTIONNAIRE - 12/09/14 1253    Type  Cancer Type Left breast cancer   Lymphedema Assessments   Lymphedema Assessments Upper extremities   Right Upper Extremity Lymphedema   10 cm Proximal to Olecranon Process 21 cm   Olecranon Process 20.3 cm   10 cm Proximal to Ulnar Styloid Process 18 cm   Just Proximal to Ulnar Styloid Process 12.8 cm   Across Hand at [employer] 17.8 cm   At [place] of 2nd Digit 5.7 cm   Left Upper Extremity Lymphedema   10 cm Proximal to Olecranon Process 20.9 cm   Olecranon Process 19.6 cm   10 cm Proximal to Ulnar Styloid Process 17 cm   Just Proximal to Ulnar Styloid Process 12.8 cm   Across Hand at [employer] 17.4 cm   At [place] of 2nd Digit 5.4 cm       Patient was instructed today in a home  exercise program today for post op shoulder range of motion. These included active assist shoulder flexion in sitting, scapular retraction, wall walking with shoulder abduction, and hands behind head external rotation.  She was encouraged to do these twice a day, holding 3 seconds and repeating 5 times when permitted by her physician.          PT Education - 12/09/14 1254    Education provided Yes   Education Details Lymphedema risk reduction and post op shoulder ROM HEP   Person(s) Educated Patient   Methods Explanation;Demonstration;Handout   Comprehension Returned demonstration;Verbalized understanding              Breast Clinic Goals - 12/09/14 1258    Patient will be able to verbalize understanding of pertinent lymphedema risk reduction practices relevant to her diagnosis specifically related to skin care.   Time 1   Period Days   Status Achieved   Patient will be able to return demonstrate and/or verbalize understanding of the post-op home exercise program related to regaining shoulder range of motion.   Time 1   Period Days   Status Achieved   Patient will be able to verbalize understanding of the importance of attending the postoperative After Breast Cancer Class for further lymphedema risk reduction education and therapeutic exercise.   Time 1   Period Days   Status Achieved              Plan - 12/09/14 1256    Clinical Impression Statement Patient was diagnosed with left ER/PR positive, HER2 negative breast cancer measuring 1 cm in size.  She is planning to have a left lumpectomy with a sentinel node biopsy followed by Oncotype testing, radiation and anti-estrogen therapy.  She may benefit from post op PT to regain shoulder ROM and strength and reduce lymphedema risk.   Pt will benefit from skilled therapeutic intervention in order to improve on the following deficits Decreased range of motion;Impaired UE functional use;Pain;Decreased knowledge of  precautions;Decreased strength   Rehab Potential Excellent   Clinical Impairments Affecting Rehab Potential none   PT Frequency One time visit   PT Treatment/Interventions Patient/family education;Therapeutic exercise   Consulted and Agree with Plan of Care Patient     Patient will follow up at outpatient cancer rehab if needed following surgery.  If the patient requires physical therapy at that time, a specific plan will be dictated and sent to the referring physician for approval. The patient was educated today on appropriate basic range of motion exercises to begin post operatively and the importance of attending the After Breast Cancer class following  surgery.  Patient was educated today on lymphedema risk reduction practices as it pertains to recommendations that will benefit the patient immediately following surgery.  She verbalized good understanding.  No additional physical therapy is indicated at this time.       Problem List Patient Active Problem List   Diagnosis Date Noted  . Breast cancer of upper-outer quadrant of left female breast 12/07/2014    Annia Friendly, PT 12/09/2014 1:00 PM  [place], [place], [address] Phone: [phone]   Fax:  [phone]

## 2014-12-09 NOTE — Progress Notes (Signed)
Radiation Oncology         [phone]) [phone] ________________________________  Initial outpatient Consultation - Date: 12/09/2014   Name: Christina [name] MRN: [id number]   DOB: 1950-10-31  REFERRING PHYSICIAN: Stark Klein, MD  DIAGNOSIS AND STAGE: T1bN0 Invasive Ductal Carcinoma of the Left Breast  HISTORY OF PRESENT ILLNESS::Christina [name] is a 64 y.o. female who underwent a screening mammogram. She was found to have a 1.5 cm mass in the upper outer quadrant of the left breast which was not well defined on ultrasound. She had a 3D biopsy which showed a grade I invasive ductal carcinoma which was ER/PR positive at 100%, Ki67 at 5%, and HER2 negative. She has done well since biopsy. She was referred to multi-disciplinary clinic for a treatment plan.   She is unaccompanied today.  She has recovered well from her biopsy. She is not taking her testosterone and has stopped her progesterone.   PREVIOUS RADIATION THERAPY: No  Past medical, social and family history were reviewed in the electronic chart. Review of symptoms was reviewed in the electronic chart. Medications were reviewed in the electronic chart.   Gynecologic History  Age at first menstrual period? 10  Are you still having periods? No Approximate date of last period? Age 29  If you are still having periods: Are your periods regular? No  If you no longer have periods: Have you used hormone replacement? Yes  If YES, for how long? 10 years When did you stop? Age 85 Obstetric History:  How many children have you carried to term? 0  Pregnant now or trying to get pregnant? No  Have you used birth control pills or hormone shots for contraception? Yes  If so, for how long (or approximate dates)? Age 6-40  Would you be interested in learning more about he options to preserve fertility? No Health Maintenance:  Have you ever had a colonoscopy? Yes If yes, date? 4 years ago  Have you ever had a bone density? Yes If yes, date? August  2015  Date of your last PAP smear? 2016 Date of your FIRST mammogram? Age 25  PHYSICAL EXAM: There were no vitals filed for this visit.. . Pleasant female. No distress. Bruising of the left breast.   IMPRESSION: T1bN0 Invasive Ductal Carcinoma of the Left Breast  PLAN: I spoke to the patient today regarding her diagnosis and options for treatment. We discussed the equivalence in terms of survival and local failure between mastectomy and breast conservation. We discussed the role of radiation in decreasing local failures in patients who undergo lumpectomy. We discussed the process of simulation and the placement tattoos. We discussed 4-6 weeks of treatment as an outpatient. We discussed the possibility of asymptomatic lung damage. We discussed the low likelihood of secondary malignancies. We discussed the possible side effects including but not limited to skin redness, fatigue, permanent skin darkening, and breast swelling.  We discussed the use of cardiac sparing with deep inspiration breath hold if needed. She met with surgery, medical oncology as well as a member of our patient family support team, a dietitian, our survivor navigator, and our physical therapist.  I spent 40 minutes  face to face with the patient and more than 50% of that time was spent in counseling and/or coordination of care.  This document serves as a record of services personally performed by Thea Silversmith, MD. It was created on her behalf by Darcus Austin, a trained medical scribe. The creation of this record is based on  the scribe's personal observations and the provider's statements to them. This document has been checked and approved by the attending provider.    ------------------------------------------------  Thea Silversmith, MD

## 2014-12-09 NOTE — Patient Instructions (Signed)

## 2014-12-15 ENCOUNTER — Telehealth: Payer: Self-pay | Admitting: *Deleted

## 2014-12-15 ENCOUNTER — Other Ambulatory Visit: Payer: Self-pay | Admitting: *Deleted

## 2014-12-15 NOTE — Telephone Encounter (Signed)
Left message for a return phone call from [facility] 12/09/14.  Awaiting patient response.

## 2014-12-16 ENCOUNTER — Telehealth: Payer: Self-pay | Admitting: Hematology

## 2014-12-16 NOTE — Telephone Encounter (Signed)
Spoke with patient and she is aware of her follow up °

## 2014-12-17 ENCOUNTER — Encounter (HOSPITAL_BASED_OUTPATIENT_CLINIC_OR_DEPARTMENT_OTHER): Payer: Self-pay | Admitting: *Deleted

## 2014-12-17 DIAGNOSIS — S80811A Abrasion, right lower leg, initial encounter: Secondary | ICD-10-CM

## 2014-12-17 HISTORY — DX: Abrasion, right lower leg, initial encounter: S80.811A

## 2014-12-17 NOTE — Pre-Procedure Instructions (Signed)
To come for EKG 

## 2014-12-21 ENCOUNTER — Encounter (HOSPITAL_BASED_OUTPATIENT_CLINIC_OR_DEPARTMENT_OTHER)
Admission: RE | Admit: 2014-12-21 | Discharge: 2014-12-21 | Disposition: A | Discharge status: Home / Self Care | Payer: Managed Care, Other (non HMO) | Source: Ambulatory Visit | Attending: General Surgery | Admitting: General Surgery

## 2014-12-21 DIAGNOSIS — Z87891 Personal history of nicotine dependence: Secondary | ICD-10-CM | POA: Diagnosis not present

## 2014-12-21 DIAGNOSIS — K219 Gastro-esophageal reflux disease without esophagitis: Secondary | ICD-10-CM | POA: Diagnosis not present

## 2014-12-21 DIAGNOSIS — E039 Hypothyroidism, unspecified: Secondary | ICD-10-CM | POA: Diagnosis not present

## 2014-12-21 DIAGNOSIS — E2740 Unspecified adrenocortical insufficiency: Secondary | ICD-10-CM | POA: Diagnosis not present

## 2014-12-21 DIAGNOSIS — Z85820 Personal history of malignant melanoma of skin: Secondary | ICD-10-CM | POA: Diagnosis not present

## 2014-12-21 DIAGNOSIS — K649 Unspecified hemorrhoids: Secondary | ICD-10-CM | POA: Diagnosis not present

## 2014-12-21 DIAGNOSIS — Z7952 Long term (current) use of systemic steroids: Secondary | ICD-10-CM | POA: Diagnosis not present

## 2014-12-21 DIAGNOSIS — C50412 Malignant neoplasm of upper-outer quadrant of left female breast: Secondary | ICD-10-CM | POA: Diagnosis present

## 2014-12-21 DIAGNOSIS — M549 Dorsalgia, unspecified: Secondary | ICD-10-CM | POA: Diagnosis not present

## 2014-12-21 DIAGNOSIS — F419 Anxiety disorder, unspecified: Secondary | ICD-10-CM | POA: Diagnosis not present

## 2014-12-21 DIAGNOSIS — E780 Pure hypercholesterolemia: Secondary | ICD-10-CM | POA: Diagnosis not present

## 2014-12-21 DIAGNOSIS — J45909 Unspecified asthma, uncomplicated: Secondary | ICD-10-CM | POA: Diagnosis not present

## 2014-12-21 DIAGNOSIS — Z170 Estrogen receptor positive status [ER+]: Secondary | ICD-10-CM | POA: Diagnosis not present

## 2014-12-21 NOTE — H&P (Signed)
Christina [name] 12/09/2014 8:21 AM Location: [facility] Patient #: [id number] DOB: 1950-06-27 Undefined / Language: Cleophus Molt / Race: White Female  History of Present Illness Stark Klein MD; 12/09/2014 11:44 AM) The patient is a 64 year old female who presents with breast cancer. Patient is a 64 yo F who is referred by Dr. Lollie Sails for new diagnosis of left breast cancer. She had a focal asymmetry detected on screening mammography. Diagnostic imaging shows a 1 cm oval lesion at 2 o'clock. Core needle biopsy is positive for grade 1 invasive ductal carcinoma that is ER/PR positive, Her 2 negative, Ki67 5%. She has no family history of breast cancer, but has a maternal uncle who had pancreatic cancer. She is a former smoker. She had menarche at age 66. She had menopause at age 60. She used HRT for 10 years. She used hormonal contraception for 20 years. She is up to date wtih colonoscopy, bone density, and pap smear. She is nulliparous. She is on a lot of supplements.  Pathology Invasive Ductal Carcinoma, grade 1. ER/PR +, Her 2 neu.  Mammo/ultrasound 1 cm oval lesion in left breast suspicious for malignancy. A tomo directed biopsy is recommended.    Other Problems Conni Slipper, RN; 12/09/2014 8:21 AM) Anxiety Disorder Asthma Back Pain Breast Cancer Gastroesophageal Reflux Disease Hemorrhoids Hypercholesterolemia Lump In Breast Melanoma Thyroid Disease  Past Surgical History Conni Slipper, RN; 12/09/2014 8:21 AM) Breast Biopsy Left. Colon Polyp Removal - Colonoscopy Foot Surgery Bilateral. Oral Surgery Tonsillectomy  Diagnostic Studies History Conni Slipper, RN; 12/09/2014 8:21 AM) Colonoscopy 1-5 years ago Mammogram within last year Pap Smear 1-5 years ago  Social History Conni Slipper, RN; 12/09/2014 8:21 AM) Alcohol use Occasional alcohol use. Caffeine use Carbonated beverages, Coffee. No drug use Tobacco use Former  smoker.  Family History Conni Slipper, RN; 12/09/2014 8:21 AM) Arthritis Mother. Cancer Family Members In General. Cerebrovascular Accident Family Members In General. Diabetes Mellitus Family Members In General, Father, Mother, Sister. Heart Disease Father. Ischemic Bowel Disease Mother.  Pregnancy / Birth History Conni Slipper, RN; 12/09/2014 8:21 AM) Age at menarche 17 years. Age of menopause <44 Contraceptive History Oral contraceptives. Gravida 0 Irregular periods Para 0  Review of Systems Stark Klein MD; 12/09/2014 11:42 AM) General Present- Fatigue. Not Present- Appetite Loss, Chills, Fever, Night Sweats, Weight Gain and Weight Loss. Skin Present- Change in Wart/Mole, Dryness and New Lesions. Not Present- Hives, Jaundice, Non-Healing Wounds, Rash and Ulcer. HEENT Present- Oral Ulcers, Seasonal Allergies, Sinus Pain and Wears glasses/contact lenses. Not Present- Earache, Hearing Loss, Hoarseness, Nose Bleed, Ringing in the Ears, Sore Throat, Visual Disturbances and Yellow Eyes. Respiratory Not Present- Bloody sputum, Chronic Cough, Difficulty Breathing, Snoring and Wheezing. Breast Not Present- Breast Mass, Breast Pain, Nipple Discharge and Skin Changes. Cardiovascular Not Present- Chest Pain, Difficulty Breathing Lying Down, Leg Cramps, Palpitations, Rapid Heart Rate, Shortness of Breath and Swelling of Extremities. Gastrointestinal Present- Bloating, Gets full quickly at meals and Indigestion. Not Present- Abdominal Pain, Bloody Stool, Change in Bowel Habits, Chronic diarrhea, Constipation, Difficulty Swallowing, Excessive gas, Hemorrhoids, Nausea, Rectal Pain and Vomiting. Female Genitourinary Not Present- Frequency, Nocturia, Painful Urination, Pelvic Pain and Urgency. Musculoskeletal Present- Back Pain, Muscle Pain and Muscle Weakness. Not Present- Joint Pain, Joint Stiffness and Swelling of Extremities. Neurological Present- Numbness. Not Present- Decreased Memory,  Fainting, Headaches, Seizures, Tingling, Tremor, Trouble walking and Weakness. Psychiatric Present- Anxiety. Not Present- Bipolar, Change in Sleep Pattern, Depression, Fearful and Frequent crying. Endocrine Present- Cold Intolerance. Not Present-  Excessive Hunger, Hair Changes, Heat Intolerance, Hot flashes and New Diabetes. Hematology Present- Easy Bruising. Not Present- Excessive bleeding, Gland problems, HIV and Persistent Infections.   Vitals Stark Klein MD; 12/09/2014 11:46 AM) 12/09/2014 11:46 AM Weight: 106.5 lb Height: 63in Body Surface Area: 1.47 m Body Mass Index: 18.87 kg/m Temp.: 97.20F  Pulse: 87 (Regular)  Resp.: 18 (Unlabored)  BP: 146/71 (Sitting, Left Arm, Standard)    Physical Exam Stark Klein MD; 12/09/2014 11:43 AM) General Mental Status-Alert. General Appearance-Consistent with stated age. Hydration-Well hydrated. Voice-Normal.  Head and Neck Head-normocephalic, atraumatic with no lesions or palpable masses. Trachea-midline. Thyroid Gland Characteristics - normal size and consistency.  Eye Eyeball - Bilateral-Extraocular movements intact. Sclera/Conjunctiva - Bilateral-No scleral icterus.  Chest and Lung Exam Chest and lung exam reveals -quiet, even and easy respiratory effort with no use of accessory muscles and on auscultation, normal breath sounds, no adventitious sounds and normal vocal resonance. Inspection Chest Wall - Normal. Back - normal.  Breast Note: breasts are symmetric bilaterally. No palpable masses or skin dimpling. No nipple retraction. Faint bruising at biopsy site. No lymphadenopathy.   Cardiovascular Cardiovascular examination reveals -normal heart sounds, regular rate and rhythm with no murmurs and normal pedal pulses bilaterally.  Abdomen Inspection Inspection of the abdomen reveals - No Hernias. Palpation/Percussion Palpation and Percussion of the abdomen reveal - Soft, Non Tender, No  Rebound tenderness, No Rigidity (guarding) and No hepatosplenomegaly. Auscultation Auscultation of the abdomen reveals - Bowel sounds normal.  Neurologic Neurologic evaluation reveals -alert and oriented x 3 with no impairment of recent or remote memory. Mental Status-Normal.  Musculoskeletal Global Assessment -Note: no gross deformities.  Normal Exam - Left-Upper Extremity Strength Normal and Lower Extremity Strength Normal. Normal Exam - Right-Upper Extremity Strength Normal and Lower Extremity Strength Normal.  Lymphatic Head & Neck  General Head & Neck Lymphatics: Bilateral - Description - Normal. Axillary  General Axillary Region: Bilateral - Description - Normal. Tenderness - Non Tender. Femoral & Inguinal  Generalized Femoral & Inguinal Lymphatics: Bilateral - Description - No Generalized lymphadenopathy.    Assessment & Plan Stark Klein MD; 12/09/2014 11:48 AM) PRIMARY CANCER OF UPPER OUTER QUADRANT OF LEFT FEMALE BREAST (174.4  C50.412) Impression: Patient has a new diagnosis of Stage 1 left breast cancer. She is a good candidate for breast conservation.  We will plan a left seed localized lumpectomy with SLN bx. It does look like her clip has migrated just a bit, so her clip may not be removed with lumpectomy.  The surgical procedure was described to the patient. I discussed the incision type and location and that we would need radiology involved with a seed marker and sentinel node injection.  The risks and benefits of the procedure were described to the patient and she wishes to proceed.  We discussed the risks bleeding, infection, damage to other structures, need for further procedures/surgeries. We discussed the risk of seroma. The patient was advised if the area in the breast in cancer, we may need to go back to surgery for additional tissue to obtain negative margins or for a lymph node biopsy. The patient was advised that these are the most common  complications, but that others can occur as well. They were advised against taking aspirin or other anti-inflammatory agents/blood thinners the week before surgery.  45 min spent in evaluation, examination, couseling, and coordination of care. >50% spent in counseling.     Signed by Stark Klein, MD (12/09/2014 11:49 AM)

## 2014-12-22 ENCOUNTER — Ambulatory Visit (HOSPITAL_BASED_OUTPATIENT_CLINIC_OR_DEPARTMENT_OTHER)
Admission: RE | Admit: 2014-12-22 | Discharge: 2014-12-22 | Disposition: A | Discharge status: Home / Self Care | Payer: Managed Care, Other (non HMO) | Source: Ambulatory Visit | Attending: General Surgery | Admitting: General Surgery

## 2014-12-22 ENCOUNTER — Ambulatory Visit (HOSPITAL_BASED_OUTPATIENT_CLINIC_OR_DEPARTMENT_OTHER): Payer: Managed Care, Other (non HMO) | Admitting: Anesthesiology

## 2014-12-22 ENCOUNTER — Encounter (HOSPITAL_BASED_OUTPATIENT_CLINIC_OR_DEPARTMENT_OTHER): Payer: Self-pay

## 2014-12-22 ENCOUNTER — Encounter (HOSPITAL_BASED_OUTPATIENT_CLINIC_OR_DEPARTMENT_OTHER)
Admission: RE | Disposition: A | Discharge status: Home / Self Care | Payer: Self-pay | Source: Ambulatory Visit | Attending: General Surgery

## 2014-12-22 ENCOUNTER — Ambulatory Visit (HOSPITAL_COMMUNITY)
Admission: RE | Admit: 2014-12-22 | Discharge: 2014-12-22 | Disposition: A | Discharge status: Home / Self Care | Payer: Managed Care, Other (non HMO) | Source: Ambulatory Visit | Attending: General Surgery | Admitting: General Surgery

## 2014-12-22 DIAGNOSIS — K219 Gastro-esophageal reflux disease without esophagitis: Secondary | ICD-10-CM | POA: Insufficient documentation

## 2014-12-22 DIAGNOSIS — C50412 Malignant neoplasm of upper-outer quadrant of left female breast: Secondary | ICD-10-CM

## 2014-12-22 DIAGNOSIS — K649 Unspecified hemorrhoids: Secondary | ICD-10-CM | POA: Insufficient documentation

## 2014-12-22 DIAGNOSIS — Z170 Estrogen receptor positive status [ER+]: Secondary | ICD-10-CM | POA: Insufficient documentation

## 2014-12-22 DIAGNOSIS — Z87891 Personal history of nicotine dependence: Secondary | ICD-10-CM | POA: Insufficient documentation

## 2014-12-22 DIAGNOSIS — E780 Pure hypercholesterolemia: Secondary | ICD-10-CM | POA: Insufficient documentation

## 2014-12-22 DIAGNOSIS — M549 Dorsalgia, unspecified: Secondary | ICD-10-CM | POA: Insufficient documentation

## 2014-12-22 DIAGNOSIS — E039 Hypothyroidism, unspecified: Secondary | ICD-10-CM | POA: Insufficient documentation

## 2014-12-22 DIAGNOSIS — F419 Anxiety disorder, unspecified: Secondary | ICD-10-CM | POA: Insufficient documentation

## 2014-12-22 DIAGNOSIS — Z85820 Personal history of malignant melanoma of skin: Secondary | ICD-10-CM | POA: Insufficient documentation

## 2014-12-22 DIAGNOSIS — Z7952 Long term (current) use of systemic steroids: Secondary | ICD-10-CM | POA: Insufficient documentation

## 2014-12-22 DIAGNOSIS — J45909 Unspecified asthma, uncomplicated: Secondary | ICD-10-CM | POA: Insufficient documentation

## 2014-12-22 DIAGNOSIS — E2740 Unspecified adrenocortical insufficiency: Secondary | ICD-10-CM | POA: Insufficient documentation

## 2014-12-22 HISTORY — DX: Presence of dental prosthetic device (complete) (partial): Z97.2

## 2014-12-22 HISTORY — DX: Dental restoration status: Z98.811

## 2014-12-22 HISTORY — DX: Unspecified cataract: H26.9

## 2014-12-22 HISTORY — DX: Abrasion, right lower leg, initial encounter: S80.811A

## 2014-12-22 HISTORY — DX: Prediabetes: R73.03

## 2014-12-22 HISTORY — DX: Hypothyroidism, unspecified: E03.9

## 2014-12-22 HISTORY — PX: BREAST LUMPECTOMY WITH RADIOACTIVE SEED AND SENTINEL LYMPH NODE BIOPSY: SHX6550

## 2014-12-22 LAB — GLUCOSE, CAPILLARY: Glucose-Capillary: 90 mg/dL (ref 65–99)

## 2014-12-22 LAB — POCT HEMOGLOBIN-HEMACUE: Hemoglobin: 12.3 g/dL (ref 12.0–15.0)

## 2014-12-22 SURGERY — BREAST LUMPECTOMY WITH RADIOACTIVE SEED AND SENTINEL LYMPH NODE BIOPSY
Anesthesia: General | Site: Breast | Laterality: Left

## 2014-12-22 MED ORDER — PROMETHAZINE HCL 25 MG/ML IJ SOLN: 6.2500 mg | INTRAMUSCULAR | Status: DC | PRN

## 2014-12-22 MED ORDER — FENTANYL CITRATE (PF) 100 MCG/2ML IJ SOLN
INTRAMUSCULAR | Status: AC
Start: 2014-12-22 — End: 2014-12-22
  Filled 2014-12-22: qty 2

## 2014-12-22 MED ORDER — SODIUM CHLORIDE 0.9 % IJ SOLN
INTRAMUSCULAR | Status: AC
  Filled 2014-12-22: qty 10

## 2014-12-22 MED ORDER — OXYCODONE-ACETAMINOPHEN 5-325 MG PO TABS: 1.0000 | ORAL_TABLET | ORAL | Status: DC | PRN

## 2014-12-22 MED ORDER — MIDAZOLAM HCL 2 MG/2ML IJ SOLN
1.0000 mg | INTRAMUSCULAR | Status: DC | PRN
  Administered 2014-12-22 (×2): 1 mg via INTRAVENOUS

## 2014-12-22 MED ORDER — ONDANSETRON HCL 4 MG/2ML IJ SOLN
INTRAMUSCULAR | Status: DC | PRN
  Administered 2014-12-22: 4 mg via INTRAVENOUS

## 2014-12-22 MED ORDER — LACTATED RINGERS IV SOLN
INTRAVENOUS | Status: DC
  Administered 2014-12-22 (×2): via INTRAVENOUS

## 2014-12-22 MED ORDER — ACETAMINOPHEN 650 MG RE SUPP: 650.0000 mg | RECTAL | Status: DC | PRN

## 2014-12-22 MED ORDER — SODIUM CHLORIDE 0.9 % IJ SOLN: 3.0000 mL | Freq: Two times a day (BID) | INTRAMUSCULAR | Status: DC

## 2014-12-22 MED ORDER — LIDOCAINE HCL (CARDIAC) 20 MG/ML IV SOLN
INTRAVENOUS | Status: DC | PRN
  Administered 2014-12-22: 50 mg via INTRAVENOUS

## 2014-12-22 MED ORDER — CEFAZOLIN SODIUM-DEXTROSE 2-3 GM-% IV SOLR
INTRAVENOUS | Status: AC
  Filled 2014-12-22: qty 50

## 2014-12-22 MED ORDER — GLYCOPYRROLATE 0.2 MG/ML IJ SOLN: 0.2000 mg | Freq: Once | INTRAMUSCULAR | Status: DC | PRN

## 2014-12-22 MED ORDER — METHYLENE BLUE 1 % INJ SOLN
INTRAMUSCULAR | Status: AC
  Filled 2014-12-22: qty 10

## 2014-12-22 MED ORDER — SODIUM CHLORIDE 0.9 % IV SOLN: 250.0000 mL | INTRAVENOUS | Status: DC | PRN

## 2014-12-22 MED ORDER — MIDAZOLAM HCL 2 MG/2ML IJ SOLN
INTRAMUSCULAR | Status: AC
  Filled 2014-12-22: qty 2

## 2014-12-22 MED ORDER — TECHNETIUM TC 99M SULFUR COLLOID FILTERED
1.0000 | Freq: Once | INTRAVENOUS | Status: DC | PRN
Start: 2014-12-22 — End: 2014-12-28

## 2014-12-22 MED ORDER — DEXAMETHASONE SODIUM PHOSPHATE 4 MG/ML IJ SOLN
INTRAMUSCULAR | Status: DC | PRN
  Administered 2014-12-22: 10 mg via INTRAVENOUS

## 2014-12-22 MED ORDER — OXYCODONE HCL 5 MG PO TABS: 5.0000 mg | ORAL_TABLET | ORAL | Status: DC | PRN

## 2014-12-22 MED ORDER — SODIUM CHLORIDE 0.9 % IJ SOLN: 3.0000 mL | INTRAMUSCULAR | Status: DC | PRN

## 2014-12-22 MED ORDER — FENTANYL CITRATE (PF) 100 MCG/2ML IJ SOLN
INTRAMUSCULAR | Status: AC
Start: 2014-12-22 — End: 2014-12-22
  Filled 2014-12-22: qty 6

## 2014-12-22 MED ORDER — FENTANYL CITRATE (PF) 100 MCG/2ML IJ SOLN
50.0000 ug | INTRAMUSCULAR | Status: DC | PRN
Start: 2014-12-22 — End: 2014-12-22
  Administered 2014-12-22 (×2): 50 ug via INTRAVENOUS

## 2014-12-22 MED ORDER — BUPIVACAINE-EPINEPHRINE (PF) 0.25% -1:200000 IJ SOLN
INTRAMUSCULAR | Status: DC | PRN
  Administered 2014-12-22: 25 mL via PERINEURAL

## 2014-12-22 MED ORDER — SCOPOLAMINE 1 MG/3DAYS TD PT72: 1.0000 | MEDICATED_PATCH | Freq: Once | TRANSDERMAL | Status: DC | PRN

## 2014-12-22 MED ORDER — BUPIVACAINE HCL (PF) 0.25 % IJ SOLN
INTRAMUSCULAR | Status: DC | PRN
  Administered 2014-12-22: 20 mL

## 2014-12-22 MED ORDER — PROPOFOL 10 MG/ML IV BOLUS
INTRAVENOUS | Status: DC | PRN
  Administered 2014-12-22: 150 mg via INTRAVENOUS

## 2014-12-22 MED ORDER — FENTANYL CITRATE (PF) 100 MCG/2ML IJ SOLN: 25.0000 ug | INTRAMUSCULAR | Status: DC | PRN

## 2014-12-22 MED ORDER — ACETAMINOPHEN 325 MG PO TABS: 650.0000 mg | ORAL_TABLET | ORAL | Status: DC | PRN

## 2014-12-22 MED ORDER — EPHEDRINE SULFATE 50 MG/ML IJ SOLN
INTRAMUSCULAR | Status: DC | PRN
  Administered 2014-12-22 (×3): 10 mg via INTRAVENOUS

## 2014-12-22 MED ORDER — CEFAZOLIN SODIUM-DEXTROSE 2-3 GM-% IV SOLR
2.0000 g | INTRAVENOUS | Status: AC
  Administered 2014-12-22: 2 g via INTRAVENOUS

## 2014-12-22 MED ORDER — BUPIVACAINE HCL (PF) 0.25 % IJ SOLN
INTRAMUSCULAR | Status: AC
  Filled 2014-12-22: qty 30

## 2014-12-22 SURGICAL SUPPLY — 65 items
APPLIER CLIP 9.375 MED OPEN (MISCELLANEOUS)
BINDER BREAST LRG (GAUZE/BANDAGES/DRESSINGS) ×2 IMPLANT
BINDER BREAST MEDIUM (GAUZE/BANDAGES/DRESSINGS) IMPLANT
BINDER BREAST XLRG (GAUZE/BANDAGES/DRESSINGS) IMPLANT
BINDER BREAST XXLRG (GAUZE/BANDAGES/DRESSINGS) IMPLANT
BLADE HEX COATED 2.75 (ELECTRODE) ×2 IMPLANT
BLADE SURG 10 STRL SS (BLADE) ×2 IMPLANT
BLADE SURG 15 STRL LF DISP TIS (BLADE) ×1 IMPLANT
BLADE SURG 15 STRL SS (BLADE) ×1
BNDG COHESIVE 4X5 TAN STRL (GAUZE/BANDAGES/DRESSINGS) ×2 IMPLANT
BNDG CONFORM 2 STRL LF (GAUZE/BANDAGES/DRESSINGS) ×2 IMPLANT
CANISTER SUC SOCK COL 7IN (MISCELLANEOUS) IMPLANT
CANISTER SUCT 1200ML W/VALVE (MISCELLANEOUS) ×2 IMPLANT
CHLORAPREP W/TINT 26ML (MISCELLANEOUS) ×2 IMPLANT
CLIP APPLIE 9.375 MED OPEN (MISCELLANEOUS) IMPLANT
CLIP TI LARGE 6 (CLIP) ×2 IMPLANT
CLIP TI MEDIUM 6 (CLIP) ×2 IMPLANT
CLIP TI WIDE RED SMALL 6 (CLIP) IMPLANT
COVER MAYO STAND STRL (DRAPES) ×2 IMPLANT
COVER PROBE W GEL 5X96 (DRAPES) ×2 IMPLANT
DECANTER SPIKE VIAL GLASS SM (MISCELLANEOUS) IMPLANT
DERMABOND ADVANCED (GAUZE/BANDAGES/DRESSINGS) ×1
DERMABOND ADVANCED .7 DNX12 (GAUZE/BANDAGES/DRESSINGS) ×1 IMPLANT
DEVICE DUBIN W/COMP PLATE 8390 (MISCELLANEOUS) ×2 IMPLANT
DRAPE UTILITY XL STRL (DRAPES) ×2 IMPLANT
DRSG PAD ABDOMINAL 8X10 ST (GAUZE/BANDAGES/DRESSINGS) IMPLANT
ELECT REM PT RETURN 9FT ADLT (ELECTROSURGICAL) ×2
ELECTRODE REM PT RTRN 9FT ADLT (ELECTROSURGICAL) ×1 IMPLANT
GLOVE BIO SURGEON STRL SZ 6 (GLOVE) ×2 IMPLANT
GLOVE BIO SURGEON STRL SZ 6.5 (GLOVE) ×4 IMPLANT
GLOVE BIOGEL PI IND STRL 6.5 (GLOVE) ×1 IMPLANT
GLOVE BIOGEL PI IND STRL 7.0 (GLOVE) ×1 IMPLANT
GLOVE BIOGEL PI INDICATOR 6.5 (GLOVE) ×1
GLOVE BIOGEL PI INDICATOR 7.0 (GLOVE) ×1
GOWN STRL REUS W/ TWL LRG LVL3 (GOWN DISPOSABLE) ×1 IMPLANT
GOWN STRL REUS W/TWL 2XL LVL3 (GOWN DISPOSABLE) ×2 IMPLANT
GOWN STRL REUS W/TWL LRG LVL3 (GOWN DISPOSABLE) ×1
KIT MARKER MARGIN INK (KITS) ×2 IMPLANT
NDL SAFETY ECLIPSE 18X1.5 (NEEDLE) IMPLANT
NEEDLE HYPO 18GX1.5 SHARP (NEEDLE)
NEEDLE HYPO 25X1 1.5 SAFETY (NEEDLE) ×2 IMPLANT
NS IRRIG 1000ML POUR BTL (IV SOLUTION) IMPLANT
PACK BASIN DAY SURGERY FS (CUSTOM PROCEDURE TRAY) ×2 IMPLANT
PACK UNIVERSAL I (CUSTOM PROCEDURE TRAY) ×2 IMPLANT
PENCIL BUTTON HOLSTER BLD 10FT (ELECTRODE) ×2 IMPLANT
SLEEVE SCD COMPRESS KNEE MED (MISCELLANEOUS) ×2 IMPLANT
SPONGE GAUZE 4X4 12PLY STER LF (GAUZE/BANDAGES/DRESSINGS) ×4 IMPLANT
SPONGE LAP 18X18 X RAY DECT (DISPOSABLE) ×2 IMPLANT
STAPLER VISISTAT 35W (STAPLE) IMPLANT
STOCKINETTE IMPERVIOUS LG (DRAPES) ×2 IMPLANT
STRIP CLOSURE SKIN 1/2X4 (GAUZE/BANDAGES/DRESSINGS) ×2 IMPLANT
SUT ETHILON 2 0 FS 18 (SUTURE) IMPLANT
SUT MNCRL AB 4-0 PS2 18 (SUTURE) ×2 IMPLANT
SUT MON AB 5-0 PS2 18 (SUTURE) IMPLANT
SUT SILK 2 0 SH (SUTURE) IMPLANT
SUT VIC AB 2-0 SH 27 (SUTURE) ×1
SUT VIC AB 2-0 SH 27XBRD (SUTURE) ×1 IMPLANT
SUT VIC AB 3-0 SH 27 (SUTURE) ×1
SUT VIC AB 3-0 SH 27X BRD (SUTURE) ×1 IMPLANT
SUT VIC AB 5-0 PS2 18 (SUTURE) IMPLANT
SYR CONTROL 10ML LL (SYRINGE) ×2 IMPLANT
TOWEL OR 17X24 6PK STRL BLUE (TOWEL DISPOSABLE) ×2 IMPLANT
TOWEL OR NON WOVEN STRL DISP B (DISPOSABLE) ×2 IMPLANT
TUBE CONNECTING 20X1/4 (TUBING) ×2 IMPLANT
YANKAUER SUCT BULB TIP NO VENT (SUCTIONS) IMPLANT

## 2014-12-22 NOTE — Interval H&P Note (Signed)
History and Physical Interval Note:  12/22/2014 7:26 AM  Christina [name]  has presented today for surgery, with the diagnosis of left breast cancer  The various methods of treatment have been discussed with the patient and family. After consideration of risks, benefits and other options for treatment, the patient has consented to  Procedure(s): BREAST LUMPECTOMY WITH RADIOACTIVE SEED AND SENTINEL LYMPH NODE BIOPSY (Left) as a surgical intervention .  The patient's history has been reviewed, patient examined, no change in status, stable for surgery.  I have reviewed the patient's chart and labs.  Questions were answered to the patient's satisfaction.     Benay Pomeroy

## 2014-12-22 NOTE — Anesthesia Preprocedure Evaluation (Addendum)
Anesthesia Evaluation  Patient identified by MRN, date of birth, ID band Patient awake    Reviewed: Allergy & Precautions, H&P , NPO status , Patient's Chart, lab work & pertinent test results  History of Anesthesia Complications Negative for: history of anesthetic complications  Airway Mallampati: II  TM Distance: >3 FB Neck ROM: full    Dental no notable dental hx.    Pulmonary former smoker,  breath sounds clear to auscultation  Pulmonary exam normal       Cardiovascular negative cardio ROS Normal cardiovascular examRhythm:regular Rate:Normal     Neuro/Psych Anxiety negative neurological ROS     GI/Hepatic negative GI ROS, Neg liver ROS,   Endo/Other  Hypothyroidism   Renal/GU negative Renal ROS     Musculoskeletal   Abdominal   Peds  Hematology negative hematology ROS (+)   Anesthesia Other Findings Listed as "adrenal insufficiency" diagnosed by integrated medicine doctor 12 years ago, takes daily medrol, had trialed cortef in the past.   Reproductive/Obstetrics negative OB ROS                           Anesthesia Physical Anesthesia Plan  ASA: II  Anesthesia Plan: General   Post-op Pain Management: GA combined w/ Regional for post-op pain   Induction: Intravenous  Airway Management Planned: LMA  Additional Equipment:   Intra-op Plan:   Post-operative Plan: Extubation in OR  Informed Consent: I have reviewed the patients History and Physical, chart, labs and discussed the procedure including the risks, benefits and alternatives for the proposed anesthesia with the patient or authorized representative who has indicated his/her understanding and acceptance.     Plan Discussed with: Anesthesiologist, CRNA and Surgeon  Anesthesia Plan Comments: (Chronic steroid use, will give stress dose steroids PECS block)      Anesthesia Quick Evaluation

## 2014-12-22 NOTE — Op Note (Signed)
Left Breast Radioactive seed localized lumpectomy and sentinel lymph node biopsy  Indications: This patient presents with history of left breast cancer, [id number]  Pre-operative Diagnosis: left breast cancer, upper outer quadrant  Post-operative Diagnosis: Same  Surgeon: Stark Klein   Anesthesia: General endotracheal anesthesia  ASA Class: 2  Procedure Details  The patient was seen in the Holding Room. The risks, benefits, complications, treatment options, and expected outcomes were discussed with the patient. The possibilities of bleeding, infection, the need for additional procedures, failure to diagnose a condition, and creating a complication requiring transfusion or operation were discussed with the patient. The patient concurred with the proposed plan, giving informed consent.  The site of surgery properly noted/marked. The patient was taken to Operating Room # 8, identified, and the procedure verified as left Breast seed localized lumpectomy with sentinel lymph node biopsy. A Time Out was held and the above information confirmed.  The left arm, breast, and chest were prepped and draped in standard fashion. The lumpectomy was performed by creating an transverse incision over the upper outer quadrant of the breast over the previously placed radioactive seed.  Dissection was carried down to around the point of maximum signal intensity. The cautery was used to perform the dissection.  Hemostasis was achieved with cautery. The edges of the cavity were marked with large clips, with one each medial, lateral, inferior and superior, and two clips posteriorly.   The specimen was inked with the margin marker paint kit.    Specimen radiography confirmed inclusion of the mammographic lesion, the clip, and the seed.  The background signal in the breast was zero.  The wound was irrigated and closed with 3-0 vicryl in layers and 4-0 monocryl subcuticular suture.    Using a hand-held gamma probe, left  axillary sentinel nodes were identified transcutaneously.  An oblique incision was created below the axillary hairline.  Dissection was carried through the clavipectoral fascia.  Two level 2 axillary sentinel nodes were removed.  Counts per second were 690 and 5.    The background count was 0 cps.  The wound was irrigated.  Hemostasis was achieved with cautery.  The axillary incision was closed with a 3-0 vicryl deep dermal interrupted sutures and a 4-0 monocryl subcuticular closure.    Sterile dressings were applied. At the end of the operation, all sponge, instrument, and needle counts were correct.  Findings: grossly clear surgical margins and no adenopathy, posterior margin is pectoralis muscle.    Estimated Blood Loss:  min         Specimens: left breast lumpectomy and two axillary sentinel lymph nodes.             Complications:  None; patient tolerated the procedure well.         Disposition: PACU - hemodynamically stable.         Condition: stable

## 2014-12-22 NOTE — Anesthesia Procedure Notes (Addendum)
Anesthesia Regional Block:  Pectoralis block  Pre-Anesthetic Checklist: ,, timeout performed, Correct Patient, Correct Site, Correct Laterality, Correct Procedure, Correct Position, site marked, Risks and benefits discussed,  Surgical consent,  Pre-op evaluation,  At surgeon's request and post-op pain management  Laterality: Left  Prep: chloraprep       Needles:  Injection technique: Single-shot  Needle Type: Stimiplex          Additional Needles:  Procedures: ultrasound guided (picture in chart) Pectoralis block Narrative:  Injection made incrementally with aspirations every 5 mL.  Performed by: Personally  Anesthesiologist: JUDD, BENJAMIN  Additional Notes: Risks, benefits and alternative to block explained extensively.  Patient tolerated procedure well, without complications.  57ml 0.25% bupiv w/ epi   Procedure Name: LMA Insertion Date/Time: 12/22/2014 7:45 AM Performed by: Melynda Ripple D Pre-anesthesia Checklist: Patient identified, Emergency Drugs available, Suction available and Patient being monitored Patient Re-evaluated:Patient Re-evaluated prior to inductionOxygen Delivery Method: Circle System Utilized Preoxygenation: Pre-oxygenation with 100% oxygen Intubation Type: IV induction Ventilation: Mask ventilation without difficulty LMA: LMA inserted LMA Size: 3.0 Number of attempts: 2 Airway Equipment and Method: Bite block Placement Confirmation: positive ETCO2 Tube secured with: Tape Dental Injury: Teeth and Oropharynx as per pre-operative assessment

## 2014-12-22 NOTE — Transfer of Care (Signed)
Immediate Anesthesia Transfer of Care Note  Patient: Christina [name]  Procedure(s) Performed: Procedure(s): BREAST LUMPECTOMY WITH RADIOACTIVE SEED AND SENTINEL LYMPH NODE BIOPSY (Left)  Patient Location: PACU  Anesthesia Type:General  Level of Consciousness: sedated  Airway & Oxygen Therapy: Patient Spontanous Breathing and Patient connected to face mask oxygen  Post-op Assessment: Report given to RN and Post -op Vital signs reviewed and stable  Post vital signs: Reviewed and stable  Last Vitals:  Filed Vitals:   12/22/14 0901  BP:   Pulse:   Temp:   Resp: 19    Complications: No apparent anesthesia complications

## 2014-12-22 NOTE — Progress Notes (Signed)
Assisted Dr. Jillyn Hidden with left, ultrasound guided, pectoralis block. Side rails up, monitors on throughout procedure. See vital signs in flow sheet. Tolerated Procedure well.

## 2014-12-22 NOTE — Discharge Instructions (Addendum)
[place] Office Phone Number [phone]  BREAST BIOPSY/ PARTIAL MASTECTOMY: POST OP INSTRUCTIONS  Always review your discharge instruction sheet given to you by the facility where your surgery was performed.  IF YOU HAVE DISABILITY OR FAMILY LEAVE FORMS, YOU MUST BRING THEM TO THE OFFICE FOR PROCESSING.  DO NOT GIVE THEM TO YOUR DOCTOR.  1. A prescription for pain medication may be given to you upon discharge.  Take your pain medication as prescribed, if needed.  If narcotic pain medicine is not needed, then you may take acetaminophen (Tylenol) or ibuprofen (Advil) as needed. 2. Take your usually prescribed medications unless otherwise directed 3. If you need a refill on your pain medication, please contact your pharmacy.  They will contact our office to request authorization.  Prescriptions will not be filled after 5pm or on week-ends. 4. You should eat very light the first 24 hours after surgery, such as soup, crackers, pudding, etc.  Resume your normal diet the day after surgery. 5. Most patients will experience some swelling and bruising in the breast.  Ice packs and a good support bra will help.  Swelling and bruising can take several days to resolve.  6. It is common to experience some constipation if taking pain medication after surgery.  Increasing fluid intake and taking a stool softener will usually help or prevent this problem from occurring.  A mild laxative (Milk of Magnesia or Miralax) should be taken according to package directions if there are no bowel movements after 48 hours. 7. Unless discharge instructions indicate otherwise, you may remove your bandages 48 hours after surgery, and you may shower at that time.  You may have steri-strips (small skin tapes) in place directly over the incision.  These strips should be left on the skin for 7-10 days.   Any sutures or staples will be removed at the office during your follow-up visit. 8. ACTIVITIES:  You may resume  regular daily activities (gradually increasing) beginning the next day.  Wearing a good support bra or sports bra (or the breast binder) minimizes pain and swelling.  You may have sexual intercourse when it is comfortable. a. You may drive when you no longer are taking prescription pain medication, you can comfortably wear a seatbelt, and you can safely maneuver your car and apply brakes. b. RETURN TO WORK:  __________1 week_______________ 9. You should see your doctor in the office for a follow-up appointment approximately two weeks after your surgery.  Your doctors nurse will typically make your follow-up appointment when she calls you with your pathology report.  Expect your pathology report 2-3 business days after your surgery.  You may call to check if you do not hear from [place] after three days.   WHEN TO CALL YOUR DOCTOR: 1. Fever over 101.0 2. Nausea and/or vomiting. 3. Extreme swelling or bruising. 4. Continued bleeding from incision. 5. Increased pain, redness, or drainage from the incision.  The clinic staff is available to answer your questions during regular business hours.  Please dont hesitate to call and ask to speak to one of the nurses for clinical concerns.  If you have a medical emergency, go to the nearest emergency room or call 911.  A surgeon from [facility] is always on call at the hospital.  For further questions, please visit [email or web address]     Post Anesthesia Home Care Instructions  Activity: Get plenty of rest for the remainder of the day. A responsible adult should stay with you for  24 hours following the procedure.  For the next 24 hours, DO NOT: -Drive a car -Paediatric nurse -Drink alcoholic beverages -Take any medication unless instructed by your physician -Make any legal decisions or sign important papers.  Meals: Start with liquid foods such as gelatin or soup. Progress to regular foods as tolerated. Avoid greasy, spicy,  heavy foods. If nausea and/or vomiting occur, drink only clear liquids until the nausea and/or vomiting subsides. Call your physician if vomiting continues.  Special Instructions/Symptoms: Your throat may feel dry or sore from the anesthesia or the breathing tube placed in your throat during surgery. If this causes discomfort, gargle with warm salt water. The discomfort should disappear within 24 hours.  If you had a scopolamine patch placed behind your ear for the management of post- operative nausea and/or vomiting:  1. The medication in the patch is effective for 72 hours, after which it should be removed.  Wrap patch in a tissue and discard in the trash. Wash hands thoroughly with soap and water. 2. You may remove the patch earlier than 72 hours if you experience unpleasant side effects which may include dry mouth, dizziness or visual disturbances. 3. Avoid touching the patch. Wash your hands with soap and water after contact with the patch.    Regional Anesthesia Blocks  1. Numbness or the inability to move the "blocked" extremity may last from 3-48 hours after placement. The length of time depends on the medication injected and your individual response to the medication. If the numbness is not going away after 48 hours, call your surgeon.  2. The extremity that is blocked will need to be protected until the numbness is gone and the  Strength has returned. Because you cannot feel it, you will need to take extra care to avoid injury. Because it may be weak, you may have difficulty moving it or using it. You may not know what position it is in without looking at it while the block is in effect.  3. For blocks in the legs and feet, returning to weight bearing and walking needs to be done carefully. You will need to wait until the numbness is entirely gone and the strength has returned. You should be able to move your leg and foot normally before you try and bear weight or walk. You will need  someone to be with you when you first try to ensure you do not fall and possibly risk injury.  4. Bruising and tenderness at the needle site are common side effects and will resolve in a few days.  5. Persistent numbness or new problems with movement should be communicated to the surgeon or the [facility] [phone] [place] [phone]).

## 2014-12-22 NOTE — Anesthesia Postprocedure Evaluation (Signed)
  Anesthesia Post-op Note  Patient: Christina [name]  Procedure(s) Performed: Procedure(s) (LRB): BREAST LUMPECTOMY WITH RADIOACTIVE SEED AND SENTINEL LYMPH NODE BIOPSY (Left)  Patient Location: PACU  Anesthesia Type: General  Level of Consciousness: awake and alert   Airway and Oxygen Therapy: Patient Spontanous Breathing  Post-op Pain: mild  Post-op Assessment: Post-op Vital signs reviewed, Patient's Cardiovascular Status Stable, Respiratory Function Stable, Patent Airway and No signs of Nausea or vomiting  Last Vitals:  Filed Vitals:   12/22/14 0930  BP: 133/69  Pulse: 95  Temp: 36.5 C  Resp: 18    Post-op Vital Signs: stable   Complications: No apparent anesthesia complications

## 2014-12-23 ENCOUNTER — Encounter (HOSPITAL_BASED_OUTPATIENT_CLINIC_OR_DEPARTMENT_OTHER): Payer: Self-pay | Admitting: General Surgery

## 2014-12-24 ENCOUNTER — Telehealth: Payer: Self-pay | Admitting: *Deleted

## 2014-12-24 NOTE — Progress Notes (Signed)
Quick Note:  Please let patient know margins are negative and LN are negative! ______

## 2014-12-24 NOTE — Telephone Encounter (Signed)
Received order for onctoype testing per Dr. Burr Medico. Requisition sent to pathology

## 2015-01-01 ENCOUNTER — Other Ambulatory Visit: Payer: Self-pay | Admitting: General Surgery

## 2015-01-13 ENCOUNTER — Encounter: Payer: Self-pay | Admitting: *Deleted

## 2015-01-13 ENCOUNTER — Encounter (HOSPITAL_COMMUNITY): Payer: Self-pay

## 2015-01-13 NOTE — Progress Notes (Signed)
Received oncotype score of 8/6%.  Copy to medical records to scan and copy to Dr. Burr Medico.

## 2015-01-14 ENCOUNTER — Ambulatory Visit (HOSPITAL_BASED_OUTPATIENT_CLINIC_OR_DEPARTMENT_OTHER): Payer: Managed Care, Other (non HMO) | Admitting: Hematology

## 2015-01-14 ENCOUNTER — Encounter: Payer: Self-pay | Admitting: Hematology

## 2015-01-14 ENCOUNTER — Ambulatory Visit (HOSPITAL_BASED_OUTPATIENT_CLINIC_OR_DEPARTMENT_OTHER): Payer: Managed Care, Other (non HMO)

## 2015-01-14 ENCOUNTER — Telehealth: Payer: Self-pay | Admitting: Hematology

## 2015-01-14 VITALS — BP 137/74 | HR 86 | Temp 98.1°F | Resp 18 | Ht 63.0 in | Wt 105.1 lb

## 2015-01-14 DIAGNOSIS — Z23 Encounter for immunization: Secondary | ICD-10-CM

## 2015-01-14 DIAGNOSIS — C50412 Malignant neoplasm of upper-outer quadrant of left female breast: Secondary | ICD-10-CM

## 2015-01-14 MED ORDER — INFLUENZA VAC SPLIT QUAD 0.5 ML IM SUSY
0.5000 mL | PREFILLED_SYRINGE | Freq: Once | INTRAMUSCULAR | Status: AC
  Administered 2015-01-14: 0.5 mL via INTRAMUSCULAR
  Filled 2015-01-14: qty 0.5

## 2015-01-14 NOTE — Telephone Encounter (Signed)
per pof to sch pt appt-gave pt copy of avs-adv pt radonc will call to sch appts with her

## 2015-01-14 NOTE — Progress Notes (Signed)
[place]  Telephone:([phone]) [phone] [phone]  Clinic Follow up Note   Patient Care Team: Lollie Sails, MD as PCP - General (Internal Medicine) Stark Klein, MD as Consulting Physician (General Surgery) Truitt Merle, MD as Consulting Physician (Hematology) Thea Silversmith, MD as Consulting Physician (Radiation Oncology) Mauro Kaufmann, RN as Registered Nurse Rockwell Germany, RN as Registered Nurse Holley Bouche, NP as Nurse Practitioner (Nurse Practitioner) Sylvan Cheese, NP as Nurse Practitioner (Nurse Practitioner) 01/14/2015  CHIEF COMPLAINTS:  Follow up left breast cancer   Oncology History   Breast cancer of upper-outer quadrant of left female breast   Staging form: Breast, AJCC 7th Edition     Clinical: No stage assigned - Unsigned     Pathologic: No stage assigned - Unsigned     Pathologic: Stage IA (T1b, N0, cM0) - Unsigned       Breast cancer of upper-outer quadrant of left female breast   12/02/2014 Mammogram a 1cm oval mass at 2:00 o'clock position    12/03/2014 Initial Diagnosis Breast cancer of upper-outer quadrant of left female breast   12/03/2014 Initial Biopsy left breast mass showed G1 invasive ductal carcinoma    12/03/2014 Receptors her2 ER 100% positive, PR 100% positive, HER-2 negative (ratio 1.5, copy number 2.05)   12/22/2014 Surgery Left breast lumpectomy and sentinel lymph node biopsy, surgical margins were negative.   12/22/2014 Pathology Results Left breast lumpectomy showed invasive ductal carcinoma, tumor size 1.6 cm, grade 1, margins were negative, 2 sentinel lymph nodes negative   12/22/2014 Oncotype testing recurrence score 8, which predicts 6% 10 year risk of distant recurrence with tamoxifen alone    HISTORY OF PRESENTING ILLNESS:  [name] 64 y.o. female is here because of newly diagnosed left breast cancer. She presents to our multidisciplinary breast clinic by herself today.  It was discovered by  screening mammogram, she did not have palpable breast mass. she feels well overall. She denies any significant pain, dyspnea, GI symptoms. Her husband had pancreatic cancer and died in 07-09-07. She lives by herself, retired, but still works full-time, very physically active. She has no children or relatives in this area, but does have friends there is supported.   She has history of hypothyroidism and adrenal insufficiency, she follows up with her primary care physician Dr. Deirdre Pippins, who also felt this alternative medicine, and she is on multiple supplements, including progesterone, testosterone cream (she rarely uses ), and DHEA, etc. she has been taking progesterone replacement for the past 10 years, which stopped last week.  INTERIM HISTORY Christina [name] returns for follow-up. She underwent left rest lumpectomy and sentinel lymph node biopsy on 12/22/2014. She tolerated surgery very well, back to work a few days later. She has been doing well, denies any pain at the surgical site, no arm swelling, no other new complaints. She is little anxious to know her Oncotype test result.  MEDICAL HISTORY:  Past Medical History  Diagnosis Date  . Adrenal insufficiency   . Breast cancer of upper-outer quadrant of left female breast 12/07/2014  . Hypothyroidism   . Prediabetes   . Dental crowns present   . Cataract, immature     bilateral  . Dental bridge present     upper - x 2  . Abrasion of right leg 12/17/2014    SURGICAL HISTORY: Past Surgical History  Procedure Laterality Date  . Bunionectomy Right   . Hammer toe surgery Bilateral   . Balloon sinuplasty    .  Septoplasty    . Rhinoplasty    . Abdominoplasty    . Breast lumpectomy with radioactive seed and sentinel lymph node biopsy Left 12/22/2014    Procedure: BREAST LUMPECTOMY WITH RADIOACTIVE SEED AND SENTINEL LYMPH NODE BIOPSY;  Surgeon: Stark Klein, MD;  Location: [facility];  Service: General;  Laterality: Left;   GYN HISTORY    Menarchal: 10 LMP: 44 Contraceptive: 19 years  HRT: progesterone for the past 10 years, stopped a few weeks ago G0P0:    SOCIAL HISTORY: Social History   Social History  . Marital Status: Widowed    Spouse Name: [name]  . Number of Children: [name]  . Years of Education: [name]   Occupational History  . Not on file.   Social History Main Topics  . Smoking status: Former Smoker -- 0.00 packs/day for 0 years    Quit date: 05/01/1980  . Smokeless tobacco: Never Used  . Alcohol Use: Yes     Comment: occasionally  . Drug Use: No  . Sexual Activity: Not on file   Other Topics Concern  . Not on file   Social History Narrative    FAMILY HISTORY: Family History  Problem Relation Age of Onset  . Diabetes Mother   . Diabetes Father   . Cancer Maternal Uncle     pancreatic cancer   . Diabetes Paternal Grandmother   . Diabetes Paternal Grandfather        ALLERGIES:  is allergic to adhesive; celery oil; doxycycline; rye grass flower pollen extract; sulfa antibiotics; and tea.  MEDICATIONS:  Current Outpatient Prescriptions  Medication Sig Dispense Refill  . albuterol (PROVENTIL HFA;VENTOLIN HFA) 108 (90 BASE) MCG/ACT inhaler Inhale into the lungs every 6 (six) hours as needed for wheezing or shortness of breath.    Ilean Skill Lipoic Acid 200 MG CAPS Take by mouth 2 (two) times daily.     . Ascorbic Acid (VITAMIN C) 1000 MG tablet Take 1,000 mg by mouth daily.    Jolyne Loa Grape-Goldenseal (BERBERINE COMPLEX PO) Take 500 mg by mouth 2 (two) times daily.     . Beta Carotene (VITAMIN A) 25000 UNIT capsule Take 25,000 Units by mouth daily.    . calcium carbonate (OS-CAL) 600 MG TABS tablet Take 800 mg by mouth 2 (two) times daily with a meal.    . cetirizine (ZYRTEC) 10 MG tablet Take by mouth.    . cholecalciferol (VITAMIN D) 1000 UNITS tablet Take 5,000 Units by mouth daily.     . clonazePAM (KLONOPIN) 1 MG tablet Take 1.5 mg by mouth at bedtime.     Marland Kitchen co-enzyme Q-10 30 MG  capsule Take 200 mg by mouth 3 (three) times daily.    . cyclobenzaprine (FLEXERIL) 10 MG tablet Take 10 mg by mouth at bedtime.  1  . DENAVIR 1 % cream Apply topically as needed.  3  . eszopiclone (LUNESTA) 1 MG TABS tablet Take 1 mg by mouth at bedtime as needed for sleep. Uses 1-2 mg    . Flaxseed, Linseed, (FLAXSEED OIL) 1000 MG CAPS Take 1,000 mg by mouth.    . fluticasone (FLONASE) 50 MCG/ACT nasal spray USE 1 SPRAY IN EACH NOSTRIL ONCE A DAY  11  . guaiFENesin (MUCINEX) 600 MG 12 hr tablet Take 600 mg by mouth 2 (two) times daily.    Marland Kitchen liothyronine (CYTOMEL) 25 MCG tablet Take 50 mcg by mouth daily.  3  . liothyronine (CYTOMEL) 50 MCG tablet Take 50 mcg by  mouth daily.    . Magnesium 300 MG CAPS Take 300 mg by mouth daily.    . metFORMIN (GLUCOPHAGE) 500 MG tablet Take 500 mg by mouth daily before supper.    . methylPREDNISolone (MEDROL) 8 MG tablet Take 8 mg by mouth daily.    . montelukast (SINGULAIR) 10 MG tablet Take 10 mg by mouth at bedtime.    . niacin 500 MG tablet Take 500 mg by mouth daily.     . Nutritional Supplements (SYTRINOL PO) Take by mouth daily.    Marland Kitchen OVER THE COUNTER MEDICATION Take by mouth daily. Nutrient 950 with NAC2    . OVER THE COUNTER MEDICATION Take by mouth daily. LV GB Complex    . OVER THE COUNTER MEDICATION Regenemax or Biosil    . OVER THE COUNTER MEDICATION Take 30 mg by mouth daily. Borotab    . OVER THE COUNTER MEDICATION Take by mouth daily. Lypozyme 1 Cap    . OVER THE COUNTER MEDICATION Take by mouth daily. Cholest Solve    . oxyCODONE-acetaminophen (PERCOCET/ROXICET) 5-325 MG per tablet Take 1 tablet by mouth every 4 (four) hours as needed for pain.    Marland Kitchen oxyCODONE-acetaminophen (ROXICET) 5-325 MG per tablet Take 1-2 tablets by mouth every 4 (four) hours as needed for severe pain. 30 tablet 0  . potassium chloride (MICRO-K) 10 MEQ CR capsule Potassium Chloride 10 MEQ/ ER.  2 tabs by mouth twice daily.    . STRONTIUM GLUCONATE-B6-B12-FA PO Take 700  mg by mouth daily.    Marland Kitchen triamcinolone (NASACORT) 55 MCG/ACT AERO nasal inhaler Place into the nose.    Marland Kitchen VITAMIN K, PHYTONADIONE, PO Take by mouth.    . zinc gluconate 50 MG tablet Take 50 mg by mouth daily.    Marland Kitchen acyclovir (ZOVIRAX) 800 MG tablet     . mupirocin ointment (BACTROBAN) 2 % Apply topically as needed.  3   No current facility-administered medications for this visit.    REVIEW OF SYSTEMS:   Constitutional: Denies fevers, chills or abnormal night sweats Eyes: Denies blurriness of vision, double vision or watery eyes Ears, nose, mouth, throat, and face: Denies mucositis or sore throat Respiratory: Denies cough, dyspnea or wheezes Cardiovascular: Denies palpitation, chest discomfort or lower extremity swelling Gastrointestinal:  Denies nausea, heartburn or change in bowel habits Skin: Denies abnormal skin rashes Lymphatics: Denies new lymphadenopathy or easy bruising Neurological:Denies numbness, tingling or new weaknesses Behavioral/Psych: Mood is stable, no new changes  All other systems were reviewed with the patient and are negative.  PHYSICAL EXAMINATION: ECOG PERFORMANCE STATUS: 0 - Asymptomatic  Filed Vitals:   01/14/15 1313  BP: 137/74  Pulse: 86  Temp: 98.1 F (36.7 C)  Resp: 18   Filed Weights   01/14/15 1313  Weight: 105 lb 1.6 oz (47.673 kg)    GENERAL:alert, no distress and comfortable SKIN: skin color, texture, turgor are normal, no rashes or significant lesions EYES: normal, conjunctiva are pink and non-injected, sclera clear OROPHARYNX:no exudate, no erythema and lips, buccal mucosa, and tongue normal  NECK: supple, thyroid normal size, non-tender, without nodularity LYMPH:  no palpable lymphadenopathy in the cervical, axillary or inguinal LUNGS: clear to auscultation and percussion with normal breathing effort HEART: regular rate & rhythm and no murmurs and no lower extremity edema ABDOMEN:abdomen soft, non-tender and normal bowel  sounds Musculoskeletal:no cyanosis of digits and no clubbing  PSYCH: alert & oriented x 3 with fluent speech NEURO: no focal motor/sensory deficits Breasts: Breast inspection showed  them to be symmetrical with no nipple discharge.  The incision at the left breast and left axilla are well healed. Palpation of the right breasts and axilla revealed no obvious mass that I could appreciate.   LABORATORY DATA:  I have reviewed the data as listed Lab Results  Component Value Date   WBC 11.6* 12/09/2014   HGB 12.3 12/22/2014   HCT 38.7 12/09/2014   MCV 93.1 12/09/2014   PLT 253 12/09/2014    Recent Labs  12/09/14 0832  NA 143  K 3.9  CO2 28  GLUCOSE 82  BUN 13.8  CREATININE 0.8  CALCIUM 9.6  PROT 6.1*  ALBUMIN 3.9  AST 19  ALT 24  ALKPHOS 48  BILITOT 0.35   PATHOLOGY REPORT:  Diagnosis 12/22/2014 1. Breast, lumpectomy, Left - INVASIVE DUCTAL CARCINOMA. - PREVIOUS BIOPSY SITE IDENTIFIED. - SEE TUMOR SYNOPTIC TEMPLATE BELOW. 2. Lymph node, sentinel, biopsy, Left axilla - ONE LYMPH NODE, NEGATIVE FOR TUMOR (0/1). 3. Lymph node, sentinel, biopsy, Left axilla palpable - ONE LYMPH NODE, NEGATIVE FOR TUMOR (0/1).  Microscopic Comment 1. BREAST, INVASIVE TUMOR, WITH LYMPH NODES PRESENT Specimen, including laterality and lymph node sampling (sentinel, non-sentinel): Left breast with sentinel lymph node sampling. Procedure: Lumpectomy. Histologic type: Ductal. Grade: 1 of 3. Tubule formation: 2. Nuclear pleomorphism: 2. Mitotic: 1. Tumor size (gross measurement): 1.6 cm. Margins: Invasive, distance to closest margin: 0.4 cm (posterior). In-situ, distance to closest margin: [name]. If margin positive, focally or broadly: [name]. Lymphovascular invasion: Absent. Ductal carcinoma in situ: Absent. Grade: Extensive. Extensive intraductal component: [name]. Lobular neoplasia: Present (atypical lobular hyperplasia). Tumor focality: Unifocal. Treatment effect: None. If present,  treatment effect in breast tissue, lymph nodes or both: [name]. Extent of tumor: Skin: [name]. Nipple: [name]. Skeletal muscle: [name]. Lymph nodes: Examined: 2 Sentinel 0 Non-sentinel 2 Total Lymph nodes with metastasis: 0. Isolated tumor cells (< 0.2 mm): [name]. Micrometastasis: (> 0.2 mm and < 2.0 mm): [name]. Macrometastasis: (> 2.0 mm): [name]. Extracapsular extension: [name]. Breast prognostic profile: Estrogen receptor: Not repeated, previous study demonstrated 100% positivity ([id number]). Progesterone receptor: Not repeated, previous study demonstrated 100% positivity ([id number]). Her 2 neu: Repeated, previous study demonstrated no amplification (1.52), [phone]). Ki-67: Not repeated, previous study demonstrated 5% proliferation rate ([id number]). Non-neoplastic breast: Fibrocystic change and previous biopsy site tissue change. TNM: pT1c, pN0, pMX.   Oncotype DX recurrence score 8, which predicts 6% 10 year risk of distant recurrence with tamoxifen alone  1. FLUORESCENCE IN-SITU HYBRIDIZATION Results: HER2 - NEGATIVE RATIO OF HER2/CEP17 SIGNALS 1.12 AVERAGE HER2 COPY NUMBER PER CELL 1.90  RADIOGRAPHIC STUDIES: I have personally reviewed the radiological images as listed and agreed with the findings in the report.  No new scans  ASSESSMENT & PLAN: 64 year old Caucasian female, with past medical history of hypothyroidism, adrenal deficiency, who was found to have a left breast cancer on screening mammogram  1. Left breast invasive ductal carcinoma, pT1cN0M0, stage IA, ER+/PR+, HER2 (-) -I reviewed her surgical pathology findings in great detail with her. -I discussed the Oncotype DX test results. Her that her recurrence score is 8, which predicts 6% 10 year risk of distant recurrence with tamoxifen alone. This is a low risk, and I would not recommend adjuvant chemotherapy. -Given her early stage ER PR positive, HER-2 negative breast cancer, low Oncotype recurrence score, she would  likely do very well. -We discussed the small risk of cancer recurrence after her surgery. She is agreeable to have breast radiation, and will see Dr. Pablo Ledger back. -We  discussed antiestrogen therapy to reduce her risk of cancer recurrence, especially distant recurrence. The different options of tamoxifen and aromatase inhibitors were discussed with her. The benefit and potential side effects were discussed with her. She was discouraged by the side effect of osteoporosis from aromatase inhibitor, and is willing to consider tamoxifen. -We discussed the role of survivorship and breast cancer surveillance  2. Bone health -Her bone density scan from 12/24/2013 showed osteopenia -She is taking calcium and vitamin D -We discussed the potential effect on bone from tamoxifen or aromatase inhibitor.  3.  History of hypothyroidism and adrenal insufficiency -She'll continue follow-up with her primary care physician Dr. Deirdre Pippins  Plan: -She will see Dr. Pablo Ledger soon -I'll see her back in 2 months, when she completes breast irradiation, to finalize her antiestrogen therapy  All questions were answered. The patient knows to call the clinic with any problems, questions or concerns. I spent 25 minutes counseling the patient face to face. The total time spent in the appointment was 30 minutes and more than 50% was on counseling.     Truitt Merle, MD 01/14/2015 3:08 PM

## 2015-01-26 NOTE — Progress Notes (Signed)
Location of Breast Cancer:Invasive ductal carcinoma of left breast upper-outer.  Histology per Pathology Report:12/22/14 iagnosis 1. Breast, lumpectomy, Left - INVASIVE DUCTAL CARCINOMA. - PREVIOUS BIOPSY SITE IDENTIFIED. - SEE TUMOR SYNOPTIC TEMPLATE BELOW. 2. Lymph node, sentinel, biopsy, Left axilla - ONE LYMPH NODE, NEGATIVE FOR TUMOR (0/1). 3. Lymph node, sentinel, biopsy, Left axilla palpable - ONE LYMPH NODE, NEGATIVE FOR TUMOR (0/1).  Receptor Status: ER(+), PR (+), Her2-neu (-)  Did patient present with symptoms (if so, please note symptoms) or was this found on screening mammography?:Found on screening   Past/Anticipated interventions by surgeon, if any:12/22/14 BREAST LUMPECTOMY WITH RADIOACTIVE SEED AND SENTINEL LYMPH NODE BIOPSY.  Past/Anticipated interventions by medical oncology, if any: Chemotherapy;follow up with Dr.Feng in 2 months.discusss antiestrogen therapy after completion of radiation. Oncotype score 8  Lymphedema issues, if any: No  Pain issues, if any:NO  SAFETY ISSUES:  Prior radiation?No  Pacemaker/ICD?No  Possible current pregnancy?No  Is the patient on methotrexate? No  Current Complaints / other details:Widowed since 2009. Works part-time at [employer]. menarche age 69.Last menstrual period age 84.Took hormone replacement therapy/progesterone for 10 years, stopped at age 28.No children  Allergies:adhesive tape, celery oil,doxycline, sulfa, tea, rye grass pollen. Former smoker, quit January 1982.    Arlyss Repress, RN 01/26/2015,10:07 AM  BP 133/69 mmHg  Pulse 103  Temp(Src) 98.6 F (37 C)  Wt 105 lb 4.8 oz (47.764 kg)

## 2015-01-27 ENCOUNTER — Ambulatory Visit
Admission: RE | Admit: 2015-01-27 | Discharge: 2015-01-27 | Disposition: A | Discharge status: Home / Self Care | Payer: Managed Care, Other (non HMO) | Source: Ambulatory Visit | Attending: Radiation Oncology | Admitting: Radiation Oncology

## 2015-01-27 ENCOUNTER — Encounter: Payer: Self-pay | Admitting: Radiation Oncology

## 2015-01-27 VITALS — BP 133/69 | HR 103 | Temp 98.6°F | Wt 105.3 lb

## 2015-01-27 DIAGNOSIS — C50412 Malignant neoplasm of upper-outer quadrant of left female breast: Secondary | ICD-10-CM

## 2015-01-27 DIAGNOSIS — Z9889 Other specified postprocedural states: Secondary | ICD-10-CM | POA: Insufficient documentation

## 2015-01-27 DIAGNOSIS — C50912 Malignant neoplasm of unspecified site of left female breast: Secondary | ICD-10-CM | POA: Diagnosis present

## 2015-01-27 DIAGNOSIS — Z170 Estrogen receptor positive status [ER+]: Secondary | ICD-10-CM | POA: Diagnosis not present

## 2015-01-27 DIAGNOSIS — Z510 Encounter for antineoplastic radiation therapy: Secondary | ICD-10-CM | POA: Diagnosis not present

## 2015-01-27 NOTE — Progress Notes (Signed)
   Department of Radiation Oncology  Phone:  [phone] Fax:        [phone]   Name: Christina [name] MRN: [id number]  DOB: 03-13-1951  Date: 01/27/2015  Follow Up Visit Note  Diagnosis: Stage I Left Breast Cancer  Summary and Interval since last radiation: [name]  Interval History: Christina [name] presents today for routine followup. She had a lumpectomy on 12/22/14 and was found to have a 1.6 cm lesion, negative margins, and two sentinel lymph nodes were negative, ER/PR positive, and HER2 negative. Oncotype testing was sent and was found to be low risk. She is ready to proceed with radiation.  Physical Exam:  Filed Vitals:   01/27/15 1258  BP: 133/69  Pulse: 103  Temp: 98.6 F (37 C)  Weight: 105 lb 4.8 oz (47.764 kg)  Pleasant woman who appears her stated age. Alert and oriented x 3.  IMPRESSION: Christina [name] is a 64 y.o. female with Stage I Left Breast Cancer  PLAN: I spoke to the patient today regarding her diagnosis and options for treatment. We discussed the equivalence in terms of survival and local failure between mastectomy and breast conservation. We discussed the role of radiation in decreasing local failures in patients who undergo lumpectomy. We discussed the process of simulation and the placement tattoos. We discussed 16 days of treatment as an outpatient. We discussed the possibility of asymptomatic lung damage. We discussed the low likelihood of secondary malignancies. We discussed the possible side effects including but not limited to skin redness, fatigue, permanent skin darkening, and breast swelling.  We discussed the use of cardiac sparing with deep inspiration breath hold if needed.  The patient signed a consent form and this was placed in her medical chart. Her CT sim is scheduled on 02/03/15 at [facility].  This document serves as a record of services personally performed by Thea Silversmith, MD. It was created on her behalf by Darcus Austin, a trained medical scribe. The  creation of this record is based on the scribe's personal observations and the provider's statements to them. This document has been checked and approved by the attending provider.   Thea Silversmith, MD

## 2015-01-27 NOTE — Progress Notes (Signed)
Please see the Nurse Progress Note in the MD Initial Consult Encounter for this patient. 

## 2015-02-03 ENCOUNTER — Ambulatory Visit
Admission: RE | Admit: 2015-02-03 | Discharge: 2015-02-03 | Disposition: A | Discharge status: Home / Self Care | Payer: Managed Care, Other (non HMO) | Source: Ambulatory Visit | Attending: Radiation Oncology | Admitting: Radiation Oncology

## 2015-02-03 DIAGNOSIS — C50412 Malignant neoplasm of upper-outer quadrant of left female breast: Secondary | ICD-10-CM

## 2015-02-03 DIAGNOSIS — C50912 Malignant neoplasm of unspecified site of left female breast: Secondary | ICD-10-CM | POA: Diagnosis present

## 2015-02-03 DIAGNOSIS — Z170 Estrogen receptor positive status [ER+]: Secondary | ICD-10-CM | POA: Diagnosis not present

## 2015-02-03 DIAGNOSIS — Z9889 Other specified postprocedural states: Secondary | ICD-10-CM | POA: Diagnosis not present

## 2015-02-03 DIAGNOSIS — Z510 Encounter for antineoplastic radiation therapy: Secondary | ICD-10-CM | POA: Diagnosis present

## 2015-02-03 NOTE — Progress Notes (Signed)
Name: Christina [name]   MRN: [id number]  Date:  02/03/2015  DOB: 1950/06/25  Status:outpatient    DIAGNOSIS: Breast cancer.  CONSENT VERIFIED: yes   SET UP: Patient is setup supine   IMMOBILIZATION:  The following immobilization was used:Custom Moldable Pillow, breast board.   NARRATIVE: [name] was brought to the [facility].  Identity was confirmed.  All relevant records and images related to the planned course of therapy were reviewed.  Then, the patient was positioned in a stable reproducible clinical set-up for radiation therapy.  Wires were placed to delineate the clinical extent of breast tissue. A wire was placed on the scar as well.  CT images were obtained.  An isocenter was placed. Skin markings were placed.  The CT images were loaded into the planning software where the target and avoidance structures were contoured.  The radiation prescription was entered and confirmed. The patient was discharged in stable condition and tolerated simulation well.    TREATMENT PLANNING NOTE:  Treatment planning then occurred. I have requested : MLC's, isodose plan, basic dose calculation  I personally designed and supervised the construction of 3 medically necessary complex treatment devices for the protection of critical normal structures including the lungs and contralateral breast as well as the immobilization device which is necessary for set up certainty.   3D simulation occurred. I requested and analyzed a dose volume histogram of the heart, lungs and lumpectomy cavity.  This document serves as a record of services personally performed by Thea Silversmith, MD. It was created on her behalf by Darcus Austin, a trained medical scribe. The creation of this record is based on the scribe's personal observations and the provider's statements to them. This document has been checked and approved by the attending provider.

## 2015-02-03 NOTE — Progress Notes (Signed)
Radiation Oncology         [phone] ________________________________  Name: Christina [name]      MRN: [id number]          Date: 02/03/2015              DOB: 1950/07/06  Optical Surface Tracking Plan:  Since intensity modulated radiotherapy (IMRT) and 3D conformal radiation treatment methods are predicated on accurate and precise positioning for treatment, intrafraction motion monitoring is medically necessary to ensure accurate and safe treatment delivery.  The ability to quantify intrafraction motion without excessive ionizing radiation dose can only be performed with optical surface tracking. Accordingly, surface imaging offers the opportunity to obtain 3D measurements of patient position throughout IMRT and 3D treatments without excessive radiation exposure.  I am ordering optical surface tracking for this patient's upcoming course of radiotherapy. ________________________________ Signature   Reference:   Ursula Alert, J, et al. Surface imaging-based analysis of intrafraction motion for breast radiotherapy patients.Journal of [place], n. 6, nov. 2014. ISSN [id number].   Available at: <http://www.jacmp.org/index.php/jacmp/article/view/4957>.

## 2015-02-04 ENCOUNTER — Encounter: Payer: Self-pay | Admitting: *Deleted

## 2015-02-04 NOTE — Progress Notes (Signed)
Flowsheet charted in error 10/6

## 2015-02-05 ENCOUNTER — Encounter: Payer: Self-pay | Admitting: Podiatry

## 2015-02-05 ENCOUNTER — Ambulatory Visit (INDEPENDENT_AMBULATORY_CARE_PROVIDER_SITE_OTHER): Payer: Managed Care, Other (non HMO) | Admitting: Podiatry

## 2015-02-05 ENCOUNTER — Ambulatory Visit (INDEPENDENT_AMBULATORY_CARE_PROVIDER_SITE_OTHER): Payer: Managed Care, Other (non HMO)

## 2015-02-05 VITALS — BP 140/77 | HR 100 | Resp 16

## 2015-02-05 DIAGNOSIS — M779 Enthesopathy, unspecified: Secondary | ICD-10-CM

## 2015-02-05 DIAGNOSIS — M79672 Pain in left foot: Secondary | ICD-10-CM | POA: Diagnosis not present

## 2015-02-05 DIAGNOSIS — M722 Plantar fascial fibromatosis: Secondary | ICD-10-CM | POA: Diagnosis not present

## 2015-02-06 NOTE — Progress Notes (Signed)
Subjective:     Patient ID: Christina [name], female   DOB: Jul 29, 1950, 64 y.o.   MRN: [id number]  HPI patient states the bottom of both my feet can bother me and I've got a lesion on my right fifth metatarsal that's small but I know which there and I need some kind of inserts to take pressure off my bones   Review of Systems     Objective:   Physical Exam Neurovascular status intact muscle strength adequate with mild inflammation in the plantar aspect of both feet around the metatarsal phalangeal joints the can get sore and lesion on the right fifth metatarsal keratotic in nature    Assessment:     Inflammatory capsulitis plantar bilateral with small lesion right fifth metatarsal    Plan:     Debrided the lesion and scanned for custom orthotics to reduce plantar pressure against the feet. Reappoint when they are returned or earlier if any issues should occur

## 2015-02-18 ENCOUNTER — Ambulatory Visit
Admission: RE | Admit: 2015-02-18 | Discharge: 2015-02-18 | Disposition: A | Discharge status: Home / Self Care | Payer: Managed Care, Other (non HMO) | Source: Ambulatory Visit | Attending: Radiation Oncology | Admitting: Radiation Oncology

## 2015-02-18 DIAGNOSIS — Z510 Encounter for antineoplastic radiation therapy: Secondary | ICD-10-CM | POA: Diagnosis not present

## 2015-02-19 ENCOUNTER — Ambulatory Visit
Admission: RE | Admit: 2015-02-19 | Discharge: 2015-02-19 | Disposition: A | Discharge status: Home / Self Care | Payer: Managed Care, Other (non HMO) | Source: Ambulatory Visit | Attending: Radiation Oncology | Admitting: Radiation Oncology

## 2015-02-19 DIAGNOSIS — Z510 Encounter for antineoplastic radiation therapy: Secondary | ICD-10-CM | POA: Diagnosis not present

## 2015-02-22 ENCOUNTER — Ambulatory Visit
Admission: RE | Admit: 2015-02-22 | Discharge: 2015-02-22 | Disposition: A | Discharge status: Home / Self Care | Payer: Managed Care, Other (non HMO) | Source: Ambulatory Visit | Attending: Radiation Oncology | Admitting: Radiation Oncology

## 2015-02-22 DIAGNOSIS — Z510 Encounter for antineoplastic radiation therapy: Secondary | ICD-10-CM | POA: Diagnosis not present

## 2015-02-23 ENCOUNTER — Ambulatory Visit
Admission: RE | Admit: 2015-02-23 | Discharge: 2015-02-23 | Disposition: A | Discharge status: Home / Self Care | Payer: Managed Care, Other (non HMO) | Source: Ambulatory Visit | Attending: Radiation Oncology | Admitting: Radiation Oncology

## 2015-02-23 ENCOUNTER — Encounter: Payer: Self-pay | Admitting: Radiation Oncology

## 2015-02-23 VITALS — BP 136/76 | HR 106 | Resp 16 | Wt 104.4 lb

## 2015-02-23 DIAGNOSIS — C50412 Malignant neoplasm of upper-outer quadrant of left female breast: Secondary | ICD-10-CM

## 2015-02-23 DIAGNOSIS — Z510 Encounter for antineoplastic radiation therapy: Secondary | ICD-10-CM | POA: Diagnosis not present

## 2015-02-23 NOTE — Progress Notes (Signed)
Weight and vitals stable. Denies pain. No skin changes of left/treated breast noted. Denies fatigue. Reports using collodial cooper spray and ALA.   BP 136/76 mmHg  Pulse 106  Resp 16  Wt 104 lb 6.4 oz (47.356 kg)  SpO2 100% Wt Readings from Last 3 Encounters:  02/23/15 104 lb 6.4 oz (47.356 kg)  01/27/15 105 lb 4.8 oz (47.764 kg)  01/14/15 105 lb 1.6 oz (47.673 kg)

## 2015-02-23 NOTE — Progress Notes (Addendum)
Weekly Management Note Current Dose:  10.68 Gy  Projected Dose:42.72  Gy   Narrative:  The patient presents for routine under treatment assessment.  CBCT/MVCT images/Port film x-rays were reviewed.  The chart was checked. Using products given to her by Dr. Sharol Roussel. Declines radiaplex.   Physical Findings: Weight: 104 lb 6.4 oz (47.356 kg). Unchanged  Impression:  The patient is tolerating radiation.  Plan:  Continue treatment as planned. Continue RT.

## 2015-02-24 ENCOUNTER — Ambulatory Visit
Admission: RE | Admit: 2015-02-24 | Discharge: 2015-02-24 | Disposition: A | Discharge status: Home / Self Care | Payer: Managed Care, Other (non HMO) | Source: Ambulatory Visit | Attending: Radiation Oncology | Admitting: Radiation Oncology

## 2015-02-24 DIAGNOSIS — Z510 Encounter for antineoplastic radiation therapy: Secondary | ICD-10-CM | POA: Diagnosis not present

## 2015-02-25 ENCOUNTER — Ambulatory Visit
Admission: RE | Admit: 2015-02-25 | Discharge: 2015-02-25 | Disposition: A | Discharge status: Home / Self Care | Payer: Managed Care, Other (non HMO) | Source: Ambulatory Visit | Attending: Radiation Oncology | Admitting: Radiation Oncology

## 2015-02-25 DIAGNOSIS — Z510 Encounter for antineoplastic radiation therapy: Secondary | ICD-10-CM | POA: Diagnosis not present

## 2015-02-26 ENCOUNTER — Telehealth: Payer: Self-pay | Admitting: *Deleted

## 2015-02-26 ENCOUNTER — Ambulatory Visit
Admission: RE | Admit: 2015-02-26 | Discharge: 2015-02-26 | Disposition: A | Discharge status: Home / Self Care | Payer: Managed Care, Other (non HMO) | Source: Ambulatory Visit | Attending: Radiation Oncology | Admitting: Radiation Oncology

## 2015-02-26 ENCOUNTER — Encounter: Payer: Self-pay | Admitting: *Deleted

## 2015-02-26 DIAGNOSIS — Z510 Encounter for antineoplastic radiation therapy: Secondary | ICD-10-CM | POA: Diagnosis not present

## 2015-02-26 NOTE — Telephone Encounter (Signed)
Left message for a return phone call to follow up after start of radiation.  Awaiting patient response.

## 2015-02-28 ENCOUNTER — Ambulatory Visit: Admission: RE | Admit: 2015-02-28 | Payer: Managed Care, Other (non HMO) | Source: Ambulatory Visit

## 2015-03-01 ENCOUNTER — Ambulatory Visit: Admission: RE | Admit: 2015-03-01 | Payer: Managed Care, Other (non HMO) | Source: Ambulatory Visit

## 2015-03-01 ENCOUNTER — Ambulatory Visit: Payer: Managed Care, Other (non HMO)

## 2015-03-02 ENCOUNTER — Ambulatory Visit
Admission: RE | Admit: 2015-03-02 | Discharge: 2015-03-02 | Disposition: A | Discharge status: Home / Self Care | Payer: Managed Care, Other (non HMO) | Source: Ambulatory Visit | Attending: Radiation Oncology | Admitting: Radiation Oncology

## 2015-03-02 VITALS — BP 120/65 | HR 85 | Temp 98.0°F | Ht 63.0 in | Wt 104.1 lb

## 2015-03-02 DIAGNOSIS — Z510 Encounter for antineoplastic radiation therapy: Secondary | ICD-10-CM | POA: Diagnosis not present

## 2015-03-02 DIAGNOSIS — C50412 Malignant neoplasm of upper-outer quadrant of left female breast: Secondary | ICD-10-CM | POA: Insufficient documentation

## 2015-03-02 MED ORDER — RADIAPLEXRX EX GEL
Freq: Once | CUTANEOUS | Status: AC
  Administered 2015-03-02: 12:00:00 via TOPICAL

## 2015-03-02 NOTE — Progress Notes (Signed)
Pt here for patient teaching.  Pt given Radiation and You booklet, skin care instructions and Radiaplex gel. Pt reports they have refused to watch the Radiation Therapy Education video.  Reviewed areas of pertinence such as fatigue, skin changes, breast tenderness, breast swelling, cough, shortness of breath, earaches and taste changes . Pt able to give teach back of to pat skin, use unscented/gentle soap and drink plenty of water,apply Radiaplex bid, avoid applying anything to skin within 4 hours of treatment, avoid wearing an under wire bra and to use an electric razor if they must shave. Pt verbalizes understanding of information given and will contact nursing with any questions or concerns.

## 2015-03-02 NOTE — Progress Notes (Signed)
Weekly Management Note Current Dose:  21.36 Gy  Projected Dose:42.72  Gy   Narrative:  The patient presents for routine under treatment assessment.  CBCT/MVCT images/Port film x-rays were reviewed.  The chart was checked. Christina [name] and has skin abrasion on leg. Would like to have antibiotics.   Physical Findings: Weight: 104 lb 1.6 oz (47.219 kg). Unchanged  Impression:  The patient is tolerating radiation.  Plan:  Continue treatment as planned. Continue RT. OK to be on Keflex.

## 2015-03-02 NOTE — Progress Notes (Signed)
Ms. Mentor is here for her 8th fraction of radiation to her Left Breast. She denies any fatigue at this time. Her skin is intact without any abnormalities, and she denies pain or  itching at her Left breast area.  BP 120/65 mmHg  Pulse 85  Temp(Src) 98 F (36.7 C)  Ht 5\' 3"  (1.6 m)  Wt 104 lb 1.6 oz (47.219 kg)  BMI 18.44 kg/m2

## 2015-03-03 ENCOUNTER — Ambulatory Visit
Admission: RE | Admit: 2015-03-03 | Discharge: 2015-03-03 | Disposition: A | Discharge status: Home / Self Care | Payer: Managed Care, Other (non HMO) | Source: Ambulatory Visit | Attending: Radiation Oncology | Admitting: Radiation Oncology

## 2015-03-03 DIAGNOSIS — Z510 Encounter for antineoplastic radiation therapy: Secondary | ICD-10-CM | POA: Diagnosis not present

## 2015-03-04 ENCOUNTER — Ambulatory Visit
Admission: RE | Admit: 2015-03-04 | Discharge: 2015-03-04 | Disposition: A | Discharge status: Home / Self Care | Payer: Managed Care, Other (non HMO) | Source: Ambulatory Visit | Attending: Radiation Oncology | Admitting: Radiation Oncology

## 2015-03-04 DIAGNOSIS — Z510 Encounter for antineoplastic radiation therapy: Secondary | ICD-10-CM | POA: Diagnosis not present

## 2015-03-05 ENCOUNTER — Ambulatory Visit: Payer: Managed Care, Other (non HMO) | Admitting: *Deleted

## 2015-03-05 ENCOUNTER — Ambulatory Visit
Admission: RE | Admit: 2015-03-05 | Discharge: 2015-03-05 | Disposition: A | Discharge status: Home / Self Care | Payer: Managed Care, Other (non HMO) | Source: Ambulatory Visit | Attending: Radiation Oncology | Admitting: Radiation Oncology

## 2015-03-05 DIAGNOSIS — Z510 Encounter for antineoplastic radiation therapy: Secondary | ICD-10-CM | POA: Diagnosis not present

## 2015-03-05 DIAGNOSIS — M779 Enthesopathy, unspecified: Secondary | ICD-10-CM

## 2015-03-05 NOTE — Patient Instructions (Signed)

## 2015-03-05 NOTE — Progress Notes (Signed)
Patient ID: Christina [name], female   DOB: January 19, 1951, 64 y.o.   MRN: [id number] Patient presents for orthotic pick up.  Verbal and written break in and wear instructions given.  Patient will follow up in 4 weeks if symptoms worsen or fail to improve.

## 2015-03-08 ENCOUNTER — Ambulatory Visit
Admission: RE | Admit: 2015-03-08 | Discharge: 2015-03-08 | Disposition: A | Discharge status: Home / Self Care | Payer: Managed Care, Other (non HMO) | Source: Ambulatory Visit | Attending: Radiation Oncology | Admitting: Radiation Oncology

## 2015-03-08 DIAGNOSIS — Z510 Encounter for antineoplastic radiation therapy: Secondary | ICD-10-CM | POA: Diagnosis not present

## 2015-03-09 ENCOUNTER — Encounter: Payer: Self-pay | Admitting: Radiation Oncology

## 2015-03-09 ENCOUNTER — Ambulatory Visit
Admission: RE | Admit: 2015-03-09 | Discharge: 2015-03-09 | Disposition: A | Discharge status: Home / Self Care | Payer: Managed Care, Other (non HMO) | Source: Ambulatory Visit | Attending: Radiation Oncology | Admitting: Radiation Oncology

## 2015-03-09 VITALS — BP 123/69 | HR 107 | Temp 97.5°F | Ht 63.0 in | Wt 103.9 lb

## 2015-03-09 DIAGNOSIS — C50412 Malignant neoplasm of upper-outer quadrant of left female breast: Secondary | ICD-10-CM | POA: Diagnosis present

## 2015-03-09 DIAGNOSIS — Z510 Encounter for antineoplastic radiation therapy: Secondary | ICD-10-CM | POA: Diagnosis not present

## 2015-03-09 MED ORDER — RADIAPLEXRX EX GEL
Freq: Once | CUTANEOUS | Status: AC
  Administered 2015-03-09: 12:00:00 via TOPICAL

## 2015-03-09 NOTE — Progress Notes (Signed)
Weekly Management Note Current Dose:  34.71Gy  Projected Dose:42.72  Gy   Narrative:  The patient presents for routine under treatment assessment.  CBCT/MVCT images/Port film x-rays were reviewed.  The chart was checked. Skin is good. Using radiaplex now.   Physical Findings: Weight: 103 lb 14.4 oz (47.129 kg). Unchanged. Minimal dermatitis. Skin is tan.   Impression:  The patient is tolerating radiation.  Plan:  Continue treatment as planned. Continue RT. Continue radiaplex.

## 2015-03-09 NOTE — Progress Notes (Signed)
Christina [name] is here for her 13th fraction of radiation to her Left Breast. She denies any fatigue and is working part-time at this time. Her skin is intact over her breast, with some mild redness. There is also redness under her Left Breast. She is using the radiaplex cream as directed.   BP 123/69 mmHg  Pulse 107  Temp(Src) 97.5 F (36.4 C)  Ht 5\' 3"  (1.6 m)  Wt 103 lb 14.4 oz (47.129 kg)  BMI 18.41 kg/m2

## 2015-03-10 ENCOUNTER — Ambulatory Visit
Admission: RE | Admit: 2015-03-10 | Discharge: 2015-03-10 | Disposition: A | Discharge status: Home / Self Care | Payer: Managed Care, Other (non HMO) | Source: Ambulatory Visit | Attending: Radiation Oncology | Admitting: Radiation Oncology

## 2015-03-10 DIAGNOSIS — Z510 Encounter for antineoplastic radiation therapy: Secondary | ICD-10-CM | POA: Diagnosis not present

## 2015-03-11 ENCOUNTER — Ambulatory Visit: Payer: Managed Care, Other (non HMO)

## 2015-03-11 ENCOUNTER — Ambulatory Visit
Admission: RE | Admit: 2015-03-11 | Discharge: 2015-03-11 | Disposition: A | Discharge status: Home / Self Care | Payer: Managed Care, Other (non HMO) | Source: Ambulatory Visit | Attending: Radiation Oncology | Admitting: Radiation Oncology

## 2015-03-11 DIAGNOSIS — Z510 Encounter for antineoplastic radiation therapy: Secondary | ICD-10-CM | POA: Diagnosis not present

## 2015-03-12 ENCOUNTER — Ambulatory Visit
Admission: RE | Admit: 2015-03-12 | Discharge: 2015-03-12 | Disposition: A | Discharge status: Home / Self Care | Payer: Managed Care, Other (non HMO) | Source: Ambulatory Visit | Attending: Radiation Oncology | Admitting: Radiation Oncology

## 2015-03-12 ENCOUNTER — Telehealth: Payer: Self-pay | Admitting: *Deleted

## 2015-03-12 ENCOUNTER — Encounter: Payer: Self-pay | Admitting: Radiation Oncology

## 2015-03-12 ENCOUNTER — Ambulatory Visit: Payer: Managed Care, Other (non HMO)

## 2015-03-12 DIAGNOSIS — Z510 Encounter for antineoplastic radiation therapy: Secondary | ICD-10-CM | POA: Diagnosis not present

## 2015-03-12 NOTE — Telephone Encounter (Signed)
Left message for a return phone call to follow up after XRT completion.

## 2015-03-13 ENCOUNTER — Ambulatory Visit: Payer: Managed Care, Other (non HMO)

## 2015-03-15 ENCOUNTER — Other Ambulatory Visit: Payer: Self-pay | Admitting: *Deleted

## 2015-03-15 ENCOUNTER — Other Ambulatory Visit: Payer: Self-pay | Admitting: Adult Health

## 2015-03-15 DIAGNOSIS — C50412 Malignant neoplasm of upper-outer quadrant of left female breast: Secondary | ICD-10-CM

## 2015-03-16 ENCOUNTER — Ambulatory Visit (HOSPITAL_BASED_OUTPATIENT_CLINIC_OR_DEPARTMENT_OTHER): Payer: Managed Care, Other (non HMO) | Admitting: Hematology

## 2015-03-16 ENCOUNTER — Encounter: Payer: Self-pay | Admitting: Hematology

## 2015-03-16 ENCOUNTER — Other Ambulatory Visit (HOSPITAL_BASED_OUTPATIENT_CLINIC_OR_DEPARTMENT_OTHER): Payer: Managed Care, Other (non HMO)

## 2015-03-16 VITALS — BP 128/73 | HR 95 | Temp 98.2°F | Resp 18 | Ht 63.0 in | Wt 104.0 lb

## 2015-03-16 DIAGNOSIS — C50412 Malignant neoplasm of upper-outer quadrant of left female breast: Secondary | ICD-10-CM

## 2015-03-16 DIAGNOSIS — Z170 Estrogen receptor positive status [ER+]: Secondary | ICD-10-CM

## 2015-03-16 LAB — CBC WITH DIFFERENTIAL/PLATELET
BASO%: 0.8 % (ref 0.0–2.0)
Basophils Absolute: 0.1 10*3/uL (ref 0.0–0.1)
EOS%: 0.3 % (ref 0.0–7.0)
Eosinophils Absolute: 0 10*3/uL (ref 0.0–0.5)
HCT: 35.9 % (ref 34.8–46.6)
HGB: 11.7 g/dL (ref 11.6–15.9)
LYMPH%: 12 % — ABNORMAL LOW (ref 14.0–49.7)
MCH: 30.2 pg (ref 25.1–34.0)
MCHC: 32.7 g/dL (ref 31.5–36.0)
MCV: 92.5 fL (ref 79.5–101.0)
MONO#: 0.6 10*3/uL (ref 0.1–0.9)
MONO%: 5.2 % (ref 0.0–14.0)
NEUT#: 9.1 10*3/uL — ABNORMAL HIGH (ref 1.5–6.5)
NEUT%: 81.7 % — ABNORMAL HIGH (ref 38.4–76.8)
Platelets: 357 10*3/uL (ref 145–400)
RBC: 3.88 10*6/uL (ref 3.70–5.45)
RDW: 13.7 % (ref 11.2–14.5)
WBC: 11.1 10*3/uL — ABNORMAL HIGH (ref 3.9–10.3)
lymph#: 1.3 10*3/uL (ref 0.9–3.3)

## 2015-03-16 LAB — COMPREHENSIVE METABOLIC PANEL (CC13)
ALT: 28 U/L (ref 0–55)
AST: 24 U/L (ref 5–34)
Albumin: 3.7 g/dL (ref 3.5–5.0)
Alkaline Phosphatase: 69 U/L (ref 40–150)
Anion Gap: 10 mEq/L (ref 3–11)
BUN: 14 mg/dL (ref 7.0–26.0)
CO2: 25 mEq/L (ref 22–29)
Calcium: 9 mg/dL (ref 8.4–10.4)
Chloride: 103 mEq/L (ref 98–109)
Creatinine: 0.7 mg/dL (ref 0.6–1.1)
EGFR: >90 mL/min/{1.73_m2} (ref >90)
Glucose: 116 mg/dl (ref 70–140)
Potassium: 4.6 mEq/L (ref 3.5–5.1)
Sodium: 138 mEq/L (ref 136–145)
Total Bilirubin: <0.3 mg/dL (ref 0.20–1.20)
Total Protein: 6.3 g/dL — ABNORMAL LOW (ref 6.4–8.3)

## 2015-03-16 NOTE — Progress Notes (Signed)
[place]  [phone] [phone]  Clinic Follow up Note   Patient Care Team: Lollie Sails, MD as PCP - General (Internal Medicine) Stark Klein, MD as Consulting Physician (General Surgery) Truitt Merle, MD as Consulting Physician (Hematology) Thea Silversmith, MD as Consulting Physician (Radiation Oncology) Mauro Kaufmann, RN as Registered Nurse Rockwell Germany, RN as Registered Nurse Holley Bouche, NP as Nurse Practitioner (Nurse Practitioner) Sylvan Cheese, NP as Nurse Practitioner (Nurse Practitioner) 03/16/2015  CHIEF COMPLAINTS:  Follow up left breast cancer   Oncology History   Breast cancer of upper-outer quadrant of left female breast   Staging form: Breast, AJCC 7th Edition     Clinical: No stage assigned - Unsigned     Pathologic: No stage assigned - Unsigned     Pathologic: Stage IA (T1b, N0, cM0) - Unsigned       Breast cancer of upper-outer quadrant of left female breast ([place])   12/02/2014 Mammogram a 1cm oval mass at 2:00 o'clock position    12/03/2014 Initial Diagnosis Breast cancer of upper-outer quadrant of left female breast   12/03/2014 Initial Biopsy left breast mass showed G1 invasive ductal carcinoma    12/03/2014 Receptors her2 ER 100% positive, PR 100% positive, HER-2 negative (ratio 1.5, copy number 2.05)   12/22/2014 Surgery Left breast lumpectomy and sentinel lymph node biopsy, surgical margins were negative.   12/22/2014 Pathology Results Left breast lumpectomy showed invasive ductal carcinoma, tumor size 1.6 cm, grade 1, margins were negative, 2 sentinel lymph nodes negative   12/22/2014 Oncotype testing recurrence score 8, which predicts 6% 10 year risk of distant recurrence with tamoxifen alone   02/18/2015 - 03/12/2015 Radiation Therapy  breast irradiation.    HISTORY OF PRESENTING ILLNESS:  [name] 64 y.o. female is here because of newly diagnosed left breast cancer. She presents to our  multidisciplinary breast clinic by herself today.  It was discovered by screening mammogram, she did not have palpable breast mass. she feels well overall. She denies any significant pain, dyspnea, GI symptoms. Her husband had pancreatic cancer and died in 06-17-07. She lives by herself, retired, but still works full-time, very physically active. She has no children or relatives in this area, but does have friends there is supported.   She has history of hypothyroidism and adrenal insufficiency, she follows up with her primary care physician Dr. Deirdre Pippins, who also felt this alternative medicine, and she is on multiple supplements, including progesterone, testosterone cream (she rarely uses ), and DHEA, etc. she has been taking progesterone replacement for the past 10 years, which stopped last week.  INTERIM HISTORY Barnett Applebaum returns for follow-up. She has completed breast radiation last week.  She tolerated well overall. She does have moderate radiations dermatitis, and moderate fatigue.  No other new complaints.   MEDICAL HISTORY:  Past Medical History  Diagnosis Date  . Adrenal insufficiency ([place])   . Breast cancer of upper-outer quadrant of left female breast ([place]) 12/07/2014  . Hypothyroidism   . Prediabetes   . Dental crowns present   . Cataract, immature     bilateral  . Dental bridge present     upper - x 2  . Abrasion of right leg 12/17/2014    SURGICAL HISTORY: Past Surgical History  Procedure Laterality Date  . Bunionectomy Right   . Hammer toe surgery Bilateral   . Balloon sinuplasty    . Septoplasty    . Rhinoplasty    . Abdominoplasty    .  Breast lumpectomy with radioactive seed and sentinel lymph node biopsy Left 12/22/2014    Procedure: BREAST LUMPECTOMY WITH RADIOACTIVE SEED AND SENTINEL LYMPH NODE BIOPSY;  Surgeon: Stark Klein, MD;  Location: [place];  Service: General;  Laterality: Left;   GYN HISTORY  Menarchal: 10 LMP: 44 Contraceptive: 19 years  HRT:  progesterone for the past 10 years, stopped a few weeks ago G0P0:    SOCIAL HISTORY: Social History   Social History  . Marital Status: Widowed    Spouse Name: [name]  . Number of Children: [name]  . Years of Education: [name]   Occupational History  .  [name]   Social History Main Topics  . Smoking status: Former Smoker -- 0.00 packs/day for 0 years    Quit date: 05/01/1980  . Smokeless tobacco: Never Used  . Alcohol Use: 0.0 oz/week    0 Standard drinks or equivalent per week     Comment: occasionally  . Drug Use: No  . Sexual Activity: Not on file   Other Topics Concern  . Not on file   Social History Narrative    FAMILY HISTORY: Family History  Problem Relation Age of Onset  . Diabetes Mother   . Diabetes Father   . Cancer Maternal Uncle     pancreatic cancer   . Diabetes Paternal Grandmother   . Diabetes Paternal Grandfather        ALLERGIES:  is allergic to adhesive; celery oil; doxycycline; rye grass flower pollen extract; sulfa antibiotics; and tea.  MEDICATIONS:  Current Outpatient Prescriptions  Medication Sig Dispense Refill  . albuterol (PROVENTIL HFA;VENTOLIN HFA) 108 (90 BASE) MCG/ACT inhaler Inhale into the lungs every 6 (six) hours as needed for wheezing or shortness of breath.    Ilean Skill Lipoic Acid 200 MG CAPS Take 600 mg by mouth 2 (two) times daily.     . Ascorbic Acid (VITAMIN C) 1000 MG tablet Take 5,000 mg by mouth.     Jolyne Loa Grape-Goldenseal (BERBERINE COMPLEX PO) Take 500 mg by mouth 2 (two) times daily.     . Beta Carotene (VITAMIN A) 25000 UNIT capsule Take 25,000 Units by mouth daily.    . calcium carbonate (OS-CAL) 600 MG TABS tablet Take 800 mg by mouth 2 (two) times daily with a meal.    . cetirizine (ZYRTEC) 10 MG tablet Take by mouth.    . cholecalciferol (VITAMIN D) 1000 UNITS tablet Take 5,000 Units by mouth daily.     . clonazePAM (KLONOPIN) 1 MG tablet Take 1.5 mg by mouth at bedtime.     Marland Kitchen co-enzyme Q-10 30 MG  capsule Take 200 mg by mouth 3 (three) times daily.    . cyclobenzaprine (FLEXERIL) 10 MG tablet Take 10 mg by mouth at bedtime.  1  . DENAVIR 1 % cream Apply topically as needed.  3  . eszopiclone (LUNESTA) 1 MG TABS tablet Take 1.5 mg by mouth at bedtime as needed for sleep. Uses 1-2 mg    . Flaxseed, Linseed, (FLAXSEED OIL) 1000 MG CAPS Take 1,000 mg by mouth.    . fluticasone (FLONASE) 50 MCG/ACT nasal spray Place 1 spray into both nostrils daily.  11  . guaiFENesin (MUCINEX) 600 MG 12 hr tablet Take 600 mg by mouth 2 (two) times daily.    Marland Kitchen liothyronine (CYTOMEL) 25 MCG tablet Take 50 mcg by mouth daily.    . Magnesium 300 MG CAPS Take 300 mg by mouth daily.    Marland Kitchen  metFORMIN (GLUCOPHAGE-XR) 500 MG 24 hr tablet Take 500 mg by mouth 2 (two) times daily with a meal.  1  . methylPREDNISolone (MEDROL) 8 MG tablet Take 8 mg by mouth daily.    . montelukast (SINGULAIR) 10 MG tablet Take 10 mg by mouth at bedtime.    . mupirocin ointment (BACTROBAN) 2 % Apply topically as needed.  3  . niacin 500 MG tablet Take 500 mg by mouth daily.     . Nutritional Supplements (SYTRINOL PO) Take by mouth daily.    Marland Kitchen OVER THE COUNTER MEDICATION Take by mouth daily. Nutrient 950 with NAC2    . OVER THE COUNTER MEDICATION Take by mouth daily. LV GB Complex    . OVER THE COUNTER MEDICATION Regenemax or Biosil    . OVER THE COUNTER MEDICATION Take 30 mg by mouth daily. Borotab    . OVER THE COUNTER MEDICATION Take by mouth daily. Lypozyme 1 Cap    . OVER THE COUNTER MEDICATION Take by mouth daily. Cholest Solve    . oxyCODONE-acetaminophen (ROXICET) 5-325 MG per tablet Take 1-2 tablets by mouth every 4 (four) hours as needed for severe pain. 30 tablet 0  . potassium chloride (MICRO-K) 10 MEQ CR capsule Potassium Chloride 10 MEQ/ ER.  2 tabs by mouth twice daily.    . STRONTIUM GLUCONATE-B6-B12-FA PO Take 700 mg by mouth daily.    Marland Kitchen triamcinolone (NASACORT) 55 MCG/ACT AERO nasal inhaler Place into the nose.    Marland Kitchen  VITAMIN K, PHYTONADIONE, PO Take by mouth.    . zinc gluconate 50 MG tablet Take 50 mg by mouth daily.     No current facility-administered medications for this visit.    REVIEW OF SYSTEMS:   Constitutional: Denies fevers, chills or abnormal night sweats Eyes: Denies blurriness of vision, double vision or watery eyes Ears, nose, mouth, throat, and face: Denies mucositis or sore throat Respiratory: Denies cough, dyspnea or wheezes Cardiovascular: Denies palpitation, chest discomfort or lower extremity swelling Gastrointestinal:  Denies nausea, heartburn or change in bowel habits Skin: Denies abnormal skin rashes Lymphatics: Denies new lymphadenopathy or easy bruising Neurological:Denies numbness, tingling or new weaknesses Behavioral/Psych: Mood is stable, no new changes  All other systems were reviewed with the patient and are negative.  PHYSICAL EXAMINATION: ECOG PERFORMANCE STATUS: 0 - Asymptomatic  Filed Vitals:   03/16/15 1302  BP: 128/73  Pulse: 95  Temp: 98.2 F (36.8 C)  Resp: 18   Filed Weights   03/16/15 1302  Weight: 104 lb (47.174 kg)    GENERAL:alert, no distress and comfortable SKIN: skin color, texture, turgor are normal, no rashes or significant lesions EYES: normal, conjunctiva are pink and non-injected, sclera clear OROPHARYNX:no exudate, no erythema and lips, buccal mucosa, and tongue normal  NECK: supple, thyroid normal size, non-tender, without nodularity LYMPH:  no palpable lymphadenopathy in the cervical, axillary or inguinal LUNGS: clear to auscultation and percussion with normal breathing effort HEART: regular rate & rhythm and no murmurs and no lower extremity edema ABDOMEN:abdomen soft, non-tender and normal bowel sounds Musculoskeletal:no cyanosis of digits and no clubbing  PSYCH: alert & oriented x 3 with fluent speech NEURO: no focal motor/sensory deficits Breasts: Breast inspection showed them to be symmetrical with no nipple discharge.   There are diffuse skin erythema and rash in the left breast , secondary to recent lesion. The incision at the left breast and left axilla are well healed. Palpation of the right breasts and axilla revealed no obvious mass that  I could appreciate.   LABORATORY DATA:  I have reviewed the data as listed Lab Results  Component Value Date   WBC 11.1* 03/16/2015   HGB 11.7 03/16/2015   HCT 35.9 03/16/2015   MCV 92.5 03/16/2015   PLT 357 03/16/2015    Recent Labs  12/09/14 0832 03/16/15 1250  NA 143 138  K 3.9 4.6  CO2 28 25  GLUCOSE 82 116  BUN 13.8 14.0  CREATININE 0.8 0.7  CALCIUM 9.6 9.0  PROT 6.1* 6.3*  ALBUMIN 3.9 3.7  AST 19 24  ALT 24 28  ALKPHOS 48 69  BILITOT 0.35 <0.30   PATHOLOGY REPORT:  Diagnosis 12/22/2014 1. Breast, lumpectomy, Left - INVASIVE DUCTAL CARCINOMA. - PREVIOUS BIOPSY SITE IDENTIFIED. - SEE TUMOR SYNOPTIC TEMPLATE BELOW. 2. Lymph node, sentinel, biopsy, Left axilla - ONE LYMPH NODE, NEGATIVE FOR TUMOR (0/1). 3. Lymph node, sentinel, biopsy, Left axilla palpable - ONE LYMPH NODE, NEGATIVE FOR TUMOR (0/1).  Microscopic Comment 1. BREAST, INVASIVE TUMOR, WITH LYMPH NODES PRESENT Specimen, including laterality and lymph node sampling (sentinel, non-sentinel): Left breast with sentinel lymph node sampling. Procedure: Lumpectomy. Histologic type: Ductal. Grade: 1 of 3. Tubule formation: 2. Nuclear pleomorphism: 2. Mitotic: 1. Tumor size (gross measurement): 1.6 cm. Margins: Invasive, distance to closest margin: 0.4 cm (posterior). In-situ, distance to closest margin: [name]. If margin positive, focally or broadly: [name]. Lymphovascular invasion: Absent. Ductal carcinoma in situ: Absent. Grade: Extensive. Extensive intraductal component: [name]. Lobular neoplasia: Present (atypical lobular hyperplasia). Tumor focality: Unifocal. Treatment effect: None. If present, treatment effect in breast tissue, lymph nodes or both: [name]. Extent of  tumor: Skin: [name]. Nipple: [name]. Skeletal muscle: [name]. Lymph nodes: Examined: 2 Sentinel 0 Non-sentinel 2 Total Lymph nodes with metastasis: 0. Isolated tumor cells (< 0.2 mm): [name]. Micrometastasis: (> 0.2 mm and < 2.0 mm): [name]. Macrometastasis: (> 2.0 mm): [name]. Extracapsular extension: [name]. Breast prognostic profile: Estrogen receptor: Not repeated, previous study demonstrated 100% positivity ([id number]). Progesterone receptor: Not repeated, previous study demonstrated 100% positivity ([id number]). Her 2 neu: Repeated, previous study demonstrated no amplification (1.52), [phone]). Ki-67: Not repeated, previous study demonstrated 5% proliferation rate ([id number]). Non-neoplastic breast: Fibrocystic change and previous biopsy site tissue change. TNM: pT1c, pN0, pMX.   Oncotype DX recurrence score 8, which predicts 6% 10 year risk of distant recurrence with tamoxifen alone  1. FLUORESCENCE IN-SITU HYBRIDIZATION Results: HER2 - NEGATIVE RATIO OF HER2/CEP17 SIGNALS 1.12 AVERAGE HER2 COPY NUMBER PER CELL 1.90  RADIOGRAPHIC STUDIES: I have personally reviewed the radiological images as listed and agreed with the findings in the report.  No new scans  ASSESSMENT & PLAN: 64 year old Caucasian female, with past medical history of hypothyroidism, adrenal deficiency, who was found to have a left breast cancer on screening mammogram  1. Left breast invasive ductal carcinoma, pT1cN0M0, stage IA, ER+/PR+, HER2 (-) -I reviewed her surgical pathology findings in great detail with her. -I discussed the Oncotype DX test results. Her that her recurrence score is 8, which predicts 6% 10 year risk of distant recurrence with tamoxifen alone. This is a low risk, and I would not recommend adjuvant chemotherapy. -Given her early stage ER PR positive, HER-2 negative breast cancer, low Oncotype recurrence score, she would likely do very well. - she now has completed breast irradiation. -We  discussed antiestrogen therapy to reduce her risk of cancer recurrence, especially distant recurrence. The different options of tamoxifen and aromatase inhibitors were discussed with her. The benefit and potential side effects were discussed with her.  She  Was very concerned about the potential side effects from this medications, especially the risk of thrombosis and endometrial cancer from tamoxifen. -After a lengthy discussion, she declined adjuvant endocrine therapy. -We discussed the role of survivorship,  She is not very interested. - we discussed breast cancer surveillance , including annual mammogram,  Self-exam and  Physical exam every 6-12 months. - she asked about alternative therapies, which we do not offer in our cancer center here.  - I encouraged her to continue healthy diet and regular exercise. - She is going to see her primary care physician Dr. Deirdre Pippins this week, and will discuss with her also   2. Bone health -Her bone density scan from 12/24/2013 showed osteopenia -She is taking calcium and vitamin D   3.  History of hypothyroidism and adrenal insufficiency -She'll continue follow-up with her primary care physician Dr. Deirdre Pippins  Plan: -she will call me if she wants to follow up with me for breast cancer surveillance.  I'll leave her follow-up appointment open for now.  All questions were answered. The patient knows to call the clinic with any problems, questions or concerns. I spent 25 minutes counseling the patient face to face. The total time spent in the appointment was 30 minutes and more than 50% was on counseling.     Truitt Merle, MD 03/16/2015 5:14 PM

## 2015-03-17 ENCOUNTER — Telehealth: Payer: Self-pay | Admitting: Hematology

## 2015-03-17 NOTE — Telephone Encounter (Signed)
no follow up for now per pof

## 2015-03-21 NOTE — Progress Notes (Signed)
  Radiation Oncology         [phone] ________________________________  Name: Christina [name] MRN: [id number]  Date: 03/12/2015  DOB: 12-16-1950  End of Treatment Note  Diagnosis:   Breast cancer of upper-outer quadrant of left female breast ([place])   Staging form: Breast, AJCC 7th Edition     Clinical: No stage assigned - Unsigned     Pathologic: No stage assigned - Unsigned     Pathologic: Stage IA (T1b, N0, cM0) - Unsigned   Indication for treatment:  Curative     Radiation treatment dates:   02/18/2015-03/12/2015  Site/dose:   Left breast/ 42.72 Gy at 2.67 Gy per fraction x 21 fractions.   Beams/energy:  Opposed tangents with reduced fields / 6 MV photons  Narrative: The patient tolerated radiation treatment relatively well. She had minimal dermatitis which was treated with radiaplex.  Plan: The patient has completed radiation treatment. The patient will return to radiation oncology clinic for routine followup in one month. I advised them to call or return sooner if they have any questions or concerns related to their recovery or treatment.  ------------------------------------------------  Thea Silversmith, MD

## 2015-03-21 NOTE — Progress Notes (Deleted)
Expand All Collapse All    Radiation Oncology [phone] ________________________________  Name: Christina Amsberry JohnsonMRN: [id number] Date: 11/11/2016DOB: 12/10/1950  End of Treatment Note  Diagnosis: Breast cancer of upper-outer quadrant of left female breast ([place])  Staging form: Breast, AJCC 7th Edition  Clinical: No stage assigned - Unsigned  Pathologic: No stage assigned - Unsigned  Pathologic: Stage IA (T1b, N0, cM0) - Unsigned  Indication for treatment: Curative   Radiation treatment dates: 02/18/2015-03/12/2015  Site/dose:  Left breast/ 42.72 Gy at 2.67 Gy per fraction x 21 fractions.   Beams/energy:  Opposed tangents with reduced fields / 6 MV photons  Narrative: The patient tolerated radiation treatment relatively well. She had minimal dermatitis which was treated with radiaplex.   Plan: The patient has completed radiation treatment. The patient will return to radiation oncology clinic for routine followup in one month. I advised them to call or return sooner if they have any questions or concerns related to their recovery or treatment.  ------------------------------------------------  Thea Silversmith, MD

## 2015-04-02 ENCOUNTER — Encounter: Payer: Self-pay | Admitting: Podiatry

## 2015-04-02 ENCOUNTER — Ambulatory Visit (INDEPENDENT_AMBULATORY_CARE_PROVIDER_SITE_OTHER): Payer: Managed Care, Other (non HMO) | Admitting: Podiatry

## 2015-04-02 VITALS — BP 133/76 | HR 105 | Resp 16

## 2015-04-02 DIAGNOSIS — M779 Enthesopathy, unspecified: Secondary | ICD-10-CM

## 2015-04-04 NOTE — Progress Notes (Signed)
Subjective:     Patient ID: Christina [name], female   DOB: 1951-01-17, 64 y.o.   MRN: [id number]  HPI patient states doing pretty well but still having some pain   Review of Systems     Objective:   Physical Exam  neurovascular status unchanged with pain is diminished    Assessment:      doing okay but does still have some problems with orthotics    Plan:      we'll modify orthotics and reappoint

## 2015-04-29 ENCOUNTER — Encounter: Payer: Managed Care, Other (non HMO) | Admitting: Nurse Practitioner

## 2015-04-29 ENCOUNTER — Ambulatory Visit
Admission: RE | Admit: 2015-04-29 | Discharge: 2015-04-29 | Disposition: A | Discharge status: Home / Self Care | Payer: Managed Care, Other (non HMO) | Source: Ambulatory Visit | Attending: Radiation Oncology | Admitting: Radiation Oncology

## 2015-04-29 VITALS — BP 127/66 | HR 86 | Temp 97.7°F | Resp 12 | Wt 103.5 lb

## 2015-04-29 DIAGNOSIS — C50412 Malignant neoplasm of upper-outer quadrant of left female breast: Secondary | ICD-10-CM

## 2015-04-29 NOTE — Progress Notes (Signed)
   Department of Radiation Oncology  Phone:  [phone] Fax:        [phone]   Name: Christina [name] MRN: [id number]  DOB: [birth year]  Date: 04/29/2015  Follow Up Visit Note  Diagnosis: T1bN0 Invasive Ductal Carcinoma of the Left Breast  Summary and Interval since last radiation: 02/18/2015-03/12/2015  Site/dose: Left breast/ 42.72 Gy at 2.67 Gy per fraction x 21 fractions.   Interval History: Christina [name] presents today for routine followup. She is currently in no pain. She recent had an appointment with her dermatologist and had multiple "lesions" on her central left chest. Pt denies edema. Pt has stopped using Radiaplex and is using Vitamin C serum. She is taking flaxseed, oatmeal, cumin, etc. in place of antiestrogen therapy. She declined antiestrogen therapy after a discussion with Dr. Burr Medico.   Physical Exam:  Filed Vitals:   04/29/15 0819  BP: 127/66  Pulse: 86  Temp: 97.7 F (36.5 C)  TempSrc: Oral  Resp: 12  Weight: 103 lb 8 oz (46.947 kg)  SpO2: 100%   Skin is well healed with an excellent cosmetic results.   IMPRESSION: Christina [name] is a 65 y.o. female with T1bN0 Invasive Ductal Carcinoma of the Left Breast, recovering from the effects of radiation.  PLAN:  The patient is scheduled for survivorship later today. She has declined antiestrogen therapy and will call Dr. Marlowe Aschoff office to follow up in February. We knows that she will follow with Dr. Barry Dienes every 6 months after that and with her PCP, Dr. Sharol Roussel.  We discussed the need for yearly mammograms which she can schedule with her OBGYN or with medical oncology. We discussed the need for sun protection in the treated area.  She can always call me with questions.  I will follow up with her on an as needed basis.    Thea Silversmith, MD  This document serves as a record of services personally performed by Thea Silversmith, MD. It was created on her behalf by Darcus Austin, a trained medical scribe. The creation of this  record is based on the scribe's personal observations and the provider's statements to them. This document has been checked and approved by the attending provider.

## 2015-04-29 NOTE — Progress Notes (Signed)
She is currently in no pain.   Pt left breast- warm dry and intact, slight pruritus.  She recent had an appointment with her dermatologist and had multiple "lesions" on her central (left chest).  Pt denies edema. Pt continues to apply Pt has stopped using Radiaplex and is using Vitamin C serum.  BP 127/66 mmHg  Pulse 86  Temp(Src) 97.7 F (36.5 C) (Oral)  Resp 12  Wt 103 lb 8 oz (46.947 kg)  SpO2 100%  Pt reports: Yes No Comments  Tamoxifen []  [x]  Declined   Arimidex []  [x]    Mammogram [x]   Date:12/04/14 []    Survivorship appointment scheduled for 04/29/15: Pt states she is not going.   Last Med Onc Dr Burr Medico: 03/16/15 Given pamphlets for survivorship, [facility] and [employer].

## 2015-05-05 ENCOUNTER — Telehealth: Payer: Self-pay | Admitting: Nurse Practitioner

## 2015-05-05 ENCOUNTER — Encounter: Payer: Self-pay | Admitting: Nurse Practitioner

## 2015-05-05 DIAGNOSIS — C50412 Malignant neoplasm of upper-outer quadrant of left female breast: Secondary | ICD-10-CM

## 2015-05-05 NOTE — Progress Notes (Signed)
The Survivorship Care Plan was mailed to Christina [name] as she was unable to come in to the Survivorship Clinic for an in-person visit at this time. A letter was mailed to her outlining the purpose of the content of the care plan, as well as encouraging her to reach out to me with any questions or concerns.  My business card was included in the correspondence to the patient as well.  A copy of the care plan was also routed/faxed/mailed to [facility], Christina [name], the patient's PCP.  I will not be placing any follow-up appointments to the Survivorship Clinic for Christina [name], but I am happy to see her at any time in the future for any survivorship concerns that may arise. Thank you for allowing me to participate in her care!  Kenn File, [place] [phone]

## 2015-05-05 NOTE — Telephone Encounter (Signed)
Reached patient regarding missed visit for survivorship care plan review on 04/29/15.  Patient doing well without complaints at this time.  Requested copy of survivorship care plan be mailed to her in lieu of in person visit.  Will mail care plan - see documentation for further details.  Patient without additional questions at this time.

## 2015-06-25 ENCOUNTER — Ambulatory Visit: Payer: Managed Care, Other (non HMO) | Admitting: *Deleted

## 2015-06-25 DIAGNOSIS — R52 Pain, unspecified: Secondary | ICD-10-CM

## 2015-06-25 NOTE — Progress Notes (Signed)
Patient ID: Christina [name], female   DOB: May 09, 1950, 65 y.o.   MRN: [id number] Patient presents with request to add support to the toes of her orthotics to support her hammertoes.  We will send orthotics to the manufacturer and see if we can add that accommodation.  We will call when orthotics arrive.

## 2015-07-13 ENCOUNTER — Ambulatory Visit: Payer: Managed Care, Other (non HMO) | Admitting: *Deleted

## 2015-07-13 DIAGNOSIS — R52 Pain, unspecified: Secondary | ICD-10-CM

## 2015-07-13 NOTE — Patient Instructions (Signed)

## 2015-07-13 NOTE — Progress Notes (Signed)
Patient ID: Christina [name], female   DOB: 13-Jul-1950, 65 y.o.   MRN: [id number] Patient presents for adjusted orthotic pick up.  Verbal and written break in and wear instructions given.  Patient will follow up in 4 weeks if symptoms worsen or fail to improve.

## 2015-11-24 ENCOUNTER — Other Ambulatory Visit: Payer: Self-pay | Admitting: General Surgery

## 2015-11-24 DIAGNOSIS — Z853 Personal history of malignant neoplasm of breast: Secondary | ICD-10-CM

## 2015-12-08 LAB — HM COLONOSCOPY

## 2015-12-10 ENCOUNTER — Ambulatory Visit
Admission: RE | Admit: 2015-12-10 | Discharge: 2015-12-10 | Disposition: A | Discharge status: Home / Self Care | Payer: Managed Care, Other (non HMO) | Source: Ambulatory Visit | Attending: General Surgery | Admitting: General Surgery

## 2015-12-10 DIAGNOSIS — Z853 Personal history of malignant neoplasm of breast: Secondary | ICD-10-CM

## 2016-06-20 DIAGNOSIS — Z85828 Personal history of other malignant neoplasm of skin: Secondary | ICD-10-CM | POA: Diagnosis not present

## 2016-06-20 DIAGNOSIS — L570 Actinic keratosis: Secondary | ICD-10-CM | POA: Diagnosis not present

## 2016-06-20 DIAGNOSIS — L853 Xerosis cutis: Secondary | ICD-10-CM | POA: Diagnosis not present

## 2016-06-20 DIAGNOSIS — L821 Other seborrheic keratosis: Secondary | ICD-10-CM | POA: Diagnosis not present

## 2016-07-21 ENCOUNTER — Telehealth (INDEPENDENT_AMBULATORY_CARE_PROVIDER_SITE_OTHER): Payer: Self-pay | Admitting: Physical Medicine and Rehabilitation

## 2016-07-21 NOTE — Telephone Encounter (Signed)
Patient now has Medicare with [employer]. Shceduled for 08/02/16 at 1400.

## 2016-07-21 NOTE — Telephone Encounter (Signed)
[place] for the injection if nothing new will eval anyway

## 2016-08-02 ENCOUNTER — Ambulatory Visit (INDEPENDENT_AMBULATORY_CARE_PROVIDER_SITE_OTHER): Payer: Medicare Other

## 2016-08-02 ENCOUNTER — Encounter (INDEPENDENT_AMBULATORY_CARE_PROVIDER_SITE_OTHER): Payer: Self-pay | Admitting: Physical Medicine and Rehabilitation

## 2016-08-02 ENCOUNTER — Ambulatory Visit (INDEPENDENT_AMBULATORY_CARE_PROVIDER_SITE_OTHER): Payer: Medicare Other | Admitting: Physical Medicine and Rehabilitation

## 2016-08-02 VITALS — BP 112/64 | HR 75

## 2016-08-02 DIAGNOSIS — M5416 Radiculopathy, lumbar region: Secondary | ICD-10-CM

## 2016-08-02 DIAGNOSIS — M5116 Intervertebral disc disorders with radiculopathy, lumbar region: Secondary | ICD-10-CM

## 2016-08-02 DIAGNOSIS — G8929 Other chronic pain: Secondary | ICD-10-CM

## 2016-08-02 DIAGNOSIS — M25551 Pain in right hip: Secondary | ICD-10-CM

## 2016-08-02 DIAGNOSIS — M5441 Lumbago with sciatica, right side: Secondary | ICD-10-CM

## 2016-08-02 MED ORDER — METHYLPREDNISOLONE ACETATE 80 MG/ML IJ SUSP: 80.0000 mg | Freq: Once | INTRAMUSCULAR | Status: DC

## 2016-08-02 MED ORDER — LIDOCAINE HCL (PF) 1 % IJ SOLN: 0.3300 mL | Freq: Once | INTRAMUSCULAR | Status: DC

## 2016-08-02 NOTE — Patient Instructions (Signed)

## 2016-08-02 NOTE — Progress Notes (Signed)
Christina [name] - 66 y.o. female MRN [id number]  Date of birth: Feb 16, 1951  Office Visit Note: Visit Date: 08/02/2016 PCP: Lollie Sails, MD Referred by: Lollie Sails, MD  Subjective: Chief Complaint  Patient presents with  . Lower Back - Pain   HPI: Christina [name] is a very pleasant 66 year old active female who I essentially see almost once a year for chronic back pain. She comes in today with increased pain for the past several months. States pain moves around. Today is more center of back and into right buttock. Occasionally it will radiate into right groin and then down leg to knee. Denies numbness and tingling. Her symptom distribution on the right is pretty classic L4 distribution. Prior injections have been at the L4 level. She does have history of small disc herniation at L3-4 as well as annular tearing and degenerative changes at L4-5. She's had an MRI in 2016 which basically showed similar findings without any frank stenosis or listhesis. She is very active riding horses. She's had no new trauma. She's had no focal weakness although her leg at times will feel like it will give out which is trying to walk or do different activities but this is random. She's had no unexplained weight loss. She's had no paresthesias or tingling. She continues with some anti-inflammatory medicines and other muscle relaxers at times. She's had physical therapy in the past and again is very active. She rates the pain over the last several months is been debilitating to the point where she really can't ride her horses like she would like to. She states the last epidural injection seemed to help quite a bit.    Review of Systems  Constitutional: Negative for chills, fever, malaise/fatigue and weight loss.  HENT: Negative for hearing loss and sinus pain.   Eyes: Negative for blurred vision, double vision and photophobia.  Respiratory: Negative for cough and shortness of breath.   Cardiovascular:  Negative for chest pain, palpitations and leg swelling.  Gastrointestinal: Negative for abdominal pain, nausea and vomiting.  Genitourinary: Negative for flank pain.  Musculoskeletal: Positive for back pain. Negative for myalgias.  Skin: Negative for itching and rash.  Neurological: Negative for tremors, focal weakness and weakness.  Endo/Heme/Allergies: Negative.   Psychiatric/Behavioral: Negative for depression.  All other systems reviewed and are negative.  Otherwise per HPI.  Assessment & Plan: Visit Diagnoses:  1. Lumbar radiculopathy   2. Radiculopathy due to lumbar intervertebral disc disorder   3. Chronic bilateral low back pain with right-sided sciatica   4. Pain in right hip     Plan: Findings:  Chronic history of low back pain for many years. Very active individual riding horses and working etc. She has failed conservative care over the years with physical therapy and chiropractic care as well as medication management. She seems to do well and she'll have a flare up 1 or 2 times per year. His last flareup is been over the last several months and it is more right-sided and more L4 distribution. She's had a fairly recent MRI in 2016 that does show disc related issues particularly small disc protrusion at L3-4 and degenerative changes at L4-5 with an annular tear. I think we should go ahead and complete diagnostic and hopefully therapeutic right L4 transforaminal epidural steroid injection versus help so well in the past. I wouldn't change any medications or activity level at this point. If the injection doesn't seem to help very much will continue with the workup and care after  that point.    Meds & Orders:  Meds ordered this encounter  Medications  . lidocaine (PF) (XYLOCAINE) 1 % injection 0.3 mL  . methylPREDNISolone acetate (DEPO-MEDROL) injection 80 mg    Orders Placed This Encounter  Procedures  . XR C-ARM NO REPORT  . Epidural Steroid injection    Follow-up: Return if  symptoms worsen or fail to improve.   Procedures: No procedures performed  Lumbosacral Transforaminal Epidural Steroid Injection - Sub-Pedicular Approach with Fluoroscopic Guidance  Patient: Christina [name]      Date of Birth: Apr 20, 1951 MRN: [id number] PCP: Lollie Sails, MD      Visit Date: 08/02/2016   Universal Protocol:    Date/Time: 04/06/185:31 AM  Consent Given By: the patient  Position: prone  Additional Comments: Vital signs were monitored before and after the procedure. Patient was prepped and draped in the usual sterile fashion. The correct patient, procedure, and site was verified.   Injection Procedure Details:  Procedure Site One Meds Administered:  Meds ordered this encounter  Medications  . lidocaine (PF) (XYLOCAINE) 1 % injection 0.3 mL  . methylPREDNISolone acetate (DEPO-MEDROL) injection 80 mg    Laterality: Right  Location/Site:  L4-L5  Needle size: 22 G  Needle type: Spinal  Needle Placement: Transforaminal  Findings:  -Contrast Used: 1 mL iohexol 180 mg iodine/mL   -Comments: Excellent flow of contrast along the nerve and into the epidural space.  Procedure Details: After squaring off the end-plates to get a true AP view, the C-arm was positioned so that an oblique view of the foramen as noted above was visualized. The target area is just inferior to the "nose of the scotty dog" or sub pedicular. The soft tissues overlying this structure were infiltrated with 2-3 ml. of 1% Lidocaine without Epinephrine.  The spinal needle was inserted toward the target using a "trajectory" view along the fluoroscope beam.  Under AP and lateral visualization, the needle was advanced so it did not puncture dura and was located close the 6 O'Clock position of the pedical in AP tracterory. Biplanar projections were used to confirm position. Aspiration was confirmed to be negative for CSF and/or blood. A 1-2 ml. volume of Isovue-250 was injected and flow of  contrast was noted at each level. Radiographs were obtained for documentation purposes.   After attaining the desired flow of contrast documented above, a 0.5 to 1.0 ml test dose of 0.25% Marcaine was injected into each respective transforaminal space.  The patient was observed for 90 seconds post injection.  After no sensory deficits were reported, and normal lower extremity motor function was noted,   the above injectate was administered so that equal amounts of the injectate were placed at each foramen (level) into the transforaminal epidural space.   Additional Comments:  The patient tolerated the procedure well Dressing: Band-Aid    Post-procedure details: Patient was observed during the procedure. Post-procedure instructions were reviewed.  Patient left the clinic in stable condition.       Clinical History: No specialty comments available.  She reports that she quit smoking about 36 years ago. She smoked 0.00 packs per day for 0.00 years. She has never used smokeless tobacco. No results for input(s): HGBA1C, LABURIC in the last 8760 hours.  Objective:  VS:  HT:    WT:   BMI:     BP:112/64  HR:75bpm  TEMP: ( )  RESP:99 % Physical Exam  Constitutional: She is oriented to person, place, and time. She appears well-developed.  No distress.  Very thin body habitus  HENT:  Head: Normocephalic and atraumatic.  Eyes: Conjunctivae and EOM are normal. Pupils are equal, round, and reactive to light.  Neck: Normal range of motion. Neck supple.  Cardiovascular: Normal rate and intact distal pulses.   Pulmonary/Chest: Effort normal.  Musculoskeletal:  Patient ambulates without aid. She does have some pain with extension rotation of the lumbar spine. No pain over the greater trochanters or with hip rotation. Good distal strength. No clonus.  Neurological: She is alert and oriented to person, place, and time. She exhibits normal muscle tone. Coordination normal.  Skin: Skin is warm  and dry. No rash noted. She is not diaphoretic. No erythema.  Psychiatric: She has a normal mood and affect. Her behavior is normal.  Nursing note and vitals reviewed.   Ortho Exam Imaging: No results found.  Past Medical/Family/Surgical/Social History: Medications & Allergies reviewed per EMR Patient Active Problem List   Diagnosis Date Noted  . Breast cancer of upper-outer quadrant of left female breast ([place]) 12/07/2014  . Acute recurrent sinusitis 01/17/2013   Past Medical History:  Diagnosis Date  . Abrasion of right leg 12/17/2014  . Adrenal insufficiency ([place])   . Breast cancer of upper-outer quadrant of left female breast ([place]) 12/07/2014  . Cataract, immature    bilateral  . Dental bridge present    upper - x 2  . Dental crowns present   . Hypothyroidism   . Prediabetes    Family History  Problem Relation Age of Onset  . Diabetes Mother   . Diabetes Father   . Cancer Maternal Uncle     pancreatic cancer   . Diabetes Paternal Grandmother   . Diabetes Paternal Grandfather    Past Surgical History:  Procedure Laterality Date  . ABDOMINOPLASTY    . BALLOON SINUPLASTY    . BREAST LUMPECTOMY WITH RADIOACTIVE SEED AND SENTINEL LYMPH NODE BIOPSY Left 12/22/2014   Procedure: BREAST LUMPECTOMY WITH RADIOACTIVE SEED AND SENTINEL LYMPH NODE BIOPSY;  Surgeon: Stark Klein, MD;  Location: [facility];  Service: General;  Laterality: Left;  . BUNIONECTOMY Right   . HAMMER TOE SURGERY Bilateral   . RHINOPLASTY    . SEPTOPLASTY     Social History   Occupational History  .  [name]   Social History Main Topics  . Smoking status: Former Smoker    Packs/day: 0.00    Years: 0.00    Quit date: 05/01/1980  . Smokeless tobacco: Never Used  . Alcohol use 0.0 oz/week     Comment: occasionally  . Drug use: No  . Sexual activity: Not on file

## 2016-08-04 NOTE — Procedures (Signed)
Lumbosacral Transforaminal Epidural Steroid Injection - Sub-Pedicular Approach with Fluoroscopic Guidance  Patient: Christina [name]      Date of Birth: 1950-10-01 MRN: [id number] PCP: Lollie Sails, MD      Visit Date: 08/02/2016   Universal Protocol:    Date/Time: 04/06/185:31 AM  Consent Given By: the patient  Position: prone  Additional Comments: Vital signs were monitored before and after the procedure. Patient was prepped and draped in the usual sterile fashion. The correct patient, procedure, and site was verified.   Injection Procedure Details:  Procedure Site One Meds Administered:  Meds ordered this encounter  Medications  . lidocaine (PF) (XYLOCAINE) 1 % injection 0.3 mL  . methylPREDNISolone acetate (DEPO-MEDROL) injection 80 mg    Laterality: Right  Location/Site:  L4-L5  Needle size: 22 G  Needle type: Spinal  Needle Placement: Transforaminal  Findings:  -Contrast Used: 1 mL iohexol 180 mg iodine/mL   -Comments: Excellent flow of contrast along the nerve and into the epidural space.  Procedure Details: After squaring off the end-plates to get a true AP view, the C-arm was positioned so that an oblique view of the foramen as noted above was visualized. The target area is just inferior to the "nose of the scotty dog" or sub pedicular. The soft tissues overlying this structure were infiltrated with 2-3 ml. of 1% Lidocaine without Epinephrine.  The spinal needle was inserted toward the target using a "trajectory" view along the fluoroscope beam.  Under AP and lateral visualization, the needle was advanced so it did not puncture dura and was located close the 6 O'Clock position of the pedical in AP tracterory. Biplanar projections were used to confirm position. Aspiration was confirmed to be negative for CSF and/or blood. A 1-2 ml. volume of Isovue-250 was injected and flow of contrast was noted at each level. Radiographs were obtained for documentation  purposes.   After attaining the desired flow of contrast documented above, a 0.5 to 1.0 ml test dose of 0.25% Marcaine was injected into each respective transforaminal space.  The patient was observed for 90 seconds post injection.  After no sensory deficits were reported, and normal lower extremity motor function was noted,   the above injectate was administered so that equal amounts of the injectate were placed at each foramen (level) into the transforaminal epidural space.   Additional Comments:  The patient tolerated the procedure well Dressing: Band-Aid    Post-procedure details: Patient was observed during the procedure. Post-procedure instructions were reviewed.  Patient left the clinic in stable condition.

## 2016-08-15 DIAGNOSIS — Z853 Personal history of malignant neoplasm of breast: Secondary | ICD-10-CM | POA: Diagnosis not present

## 2016-08-17 DIAGNOSIS — H5711 Ocular pain, right eye: Secondary | ICD-10-CM | POA: Diagnosis not present

## 2016-09-22 DIAGNOSIS — Z85828 Personal history of other malignant neoplasm of skin: Secondary | ICD-10-CM | POA: Diagnosis not present

## 2016-09-22 DIAGNOSIS — D225 Melanocytic nevi of trunk: Secondary | ICD-10-CM | POA: Diagnosis not present

## 2016-09-22 DIAGNOSIS — L821 Other seborrheic keratosis: Secondary | ICD-10-CM | POA: Diagnosis not present

## 2016-09-22 DIAGNOSIS — D692 Other nonthrombocytopenic purpura: Secondary | ICD-10-CM | POA: Diagnosis not present

## 2016-09-22 DIAGNOSIS — L570 Actinic keratosis: Secondary | ICD-10-CM | POA: Diagnosis not present

## 2016-10-03 DIAGNOSIS — M5137 Other intervertebral disc degeneration, lumbosacral region: Secondary | ICD-10-CM | POA: Diagnosis not present

## 2016-10-03 DIAGNOSIS — M5414 Radiculopathy, thoracic region: Secondary | ICD-10-CM | POA: Diagnosis not present

## 2016-10-03 DIAGNOSIS — M9905 Segmental and somatic dysfunction of pelvic region: Secondary | ICD-10-CM | POA: Diagnosis not present

## 2016-10-03 DIAGNOSIS — M9902 Segmental and somatic dysfunction of thoracic region: Secondary | ICD-10-CM | POA: Diagnosis not present

## 2016-10-03 DIAGNOSIS — M5417 Radiculopathy, lumbosacral region: Secondary | ICD-10-CM | POA: Diagnosis not present

## 2016-10-03 DIAGNOSIS — M9903 Segmental and somatic dysfunction of lumbar region: Secondary | ICD-10-CM | POA: Diagnosis not present

## 2016-10-05 DIAGNOSIS — M5137 Other intervertebral disc degeneration, lumbosacral region: Secondary | ICD-10-CM | POA: Diagnosis not present

## 2016-10-05 DIAGNOSIS — M9903 Segmental and somatic dysfunction of lumbar region: Secondary | ICD-10-CM | POA: Diagnosis not present

## 2016-10-05 DIAGNOSIS — M5414 Radiculopathy, thoracic region: Secondary | ICD-10-CM | POA: Diagnosis not present

## 2016-10-05 DIAGNOSIS — M5417 Radiculopathy, lumbosacral region: Secondary | ICD-10-CM | POA: Diagnosis not present

## 2016-10-05 DIAGNOSIS — M9902 Segmental and somatic dysfunction of thoracic region: Secondary | ICD-10-CM | POA: Diagnosis not present

## 2016-10-05 DIAGNOSIS — M9905 Segmental and somatic dysfunction of pelvic region: Secondary | ICD-10-CM | POA: Diagnosis not present

## 2016-10-09 DIAGNOSIS — M5137 Other intervertebral disc degeneration, lumbosacral region: Secondary | ICD-10-CM | POA: Diagnosis not present

## 2016-10-09 DIAGNOSIS — M9903 Segmental and somatic dysfunction of lumbar region: Secondary | ICD-10-CM | POA: Diagnosis not present

## 2016-10-09 DIAGNOSIS — M9902 Segmental and somatic dysfunction of thoracic region: Secondary | ICD-10-CM | POA: Diagnosis not present

## 2016-10-09 DIAGNOSIS — M5417 Radiculopathy, lumbosacral region: Secondary | ICD-10-CM | POA: Diagnosis not present

## 2016-10-09 DIAGNOSIS — M5414 Radiculopathy, thoracic region: Secondary | ICD-10-CM | POA: Diagnosis not present

## 2016-10-09 DIAGNOSIS — M9905 Segmental and somatic dysfunction of pelvic region: Secondary | ICD-10-CM | POA: Diagnosis not present

## 2016-10-12 DIAGNOSIS — M5417 Radiculopathy, lumbosacral region: Secondary | ICD-10-CM | POA: Diagnosis not present

## 2016-10-12 DIAGNOSIS — M9905 Segmental and somatic dysfunction of pelvic region: Secondary | ICD-10-CM | POA: Diagnosis not present

## 2016-10-12 DIAGNOSIS — M5414 Radiculopathy, thoracic region: Secondary | ICD-10-CM | POA: Diagnosis not present

## 2016-10-12 DIAGNOSIS — M9903 Segmental and somatic dysfunction of lumbar region: Secondary | ICD-10-CM | POA: Diagnosis not present

## 2016-10-12 DIAGNOSIS — M9902 Segmental and somatic dysfunction of thoracic region: Secondary | ICD-10-CM | POA: Diagnosis not present

## 2016-10-12 DIAGNOSIS — M5137 Other intervertebral disc degeneration, lumbosacral region: Secondary | ICD-10-CM | POA: Diagnosis not present

## 2016-10-18 ENCOUNTER — Ambulatory Visit (INDEPENDENT_AMBULATORY_CARE_PROVIDER_SITE_OTHER): Payer: Medicare Other | Admitting: Orthopedic Surgery

## 2016-10-18 ENCOUNTER — Encounter (INDEPENDENT_AMBULATORY_CARE_PROVIDER_SITE_OTHER): Payer: Self-pay | Admitting: Orthopedic Surgery

## 2016-10-18 DIAGNOSIS — E349 Endocrine disorder, unspecified: Secondary | ICD-10-CM | POA: Diagnosis not present

## 2016-10-18 DIAGNOSIS — C50919 Malignant neoplasm of unspecified site of unspecified female breast: Secondary | ICD-10-CM | POA: Diagnosis not present

## 2016-10-18 DIAGNOSIS — E782 Mixed hyperlipidemia: Secondary | ICD-10-CM | POA: Diagnosis not present

## 2016-10-18 DIAGNOSIS — M5441 Lumbago with sciatica, right side: Secondary | ICD-10-CM | POA: Diagnosis not present

## 2016-10-18 DIAGNOSIS — G8929 Other chronic pain: Secondary | ICD-10-CM | POA: Diagnosis not present

## 2016-10-18 DIAGNOSIS — R100 Acute abdomen: Secondary | ICD-10-CM | POA: Diagnosis not present

## 2016-10-18 DIAGNOSIS — M5416 Radiculopathy, lumbar region: Secondary | ICD-10-CM

## 2016-10-18 DIAGNOSIS — R7989 Other specified abnormal findings of blood chemistry: Secondary | ICD-10-CM | POA: Diagnosis not present

## 2016-10-18 DIAGNOSIS — R5382 Chronic fatigue, unspecified: Secondary | ICD-10-CM | POA: Diagnosis not present

## 2016-10-18 DIAGNOSIS — M545 Low back pain: Secondary | ICD-10-CM | POA: Diagnosis not present

## 2016-10-18 DIAGNOSIS — E279 Disorder of adrenal gland, unspecified: Secondary | ICD-10-CM | POA: Diagnosis not present

## 2016-10-18 DIAGNOSIS — R109 Unspecified abdominal pain: Secondary | ICD-10-CM | POA: Diagnosis not present

## 2016-10-18 DIAGNOSIS — G893 Neoplasm related pain (acute) (chronic): Secondary | ICD-10-CM | POA: Diagnosis not present

## 2016-10-18 DIAGNOSIS — E538 Deficiency of other specified B group vitamins: Secondary | ICD-10-CM | POA: Diagnosis not present

## 2016-10-18 DIAGNOSIS — E54 Ascorbic acid deficiency: Secondary | ICD-10-CM | POA: Diagnosis not present

## 2016-10-18 DIAGNOSIS — E559 Vitamin D deficiency, unspecified: Secondary | ICD-10-CM | POA: Diagnosis not present

## 2016-10-18 DIAGNOSIS — E039 Hypothyroidism, unspecified: Secondary | ICD-10-CM | POA: Diagnosis not present

## 2016-10-18 DIAGNOSIS — E785 Hyperlipidemia, unspecified: Secondary | ICD-10-CM | POA: Diagnosis not present

## 2016-10-18 NOTE — Progress Notes (Signed)
Office Visit Note   Patient: Christina [name]           Date of Birth: 1951/01/04           MRN: [id number] Visit Date: 10/18/2016 Requested by: Lollie Sails, MD [address] [place], [place] [address] PCP: Lollie Sails, MD  Subjective: Chief Complaint  Patient presents with  . Lower Back - Pain    HPI: Christina [name] is a 66 year old patient with back and right leg pain.  Been going on for 3 weeks.  She's been seeing her chiropractor who is given her 5 adjustments.  He suggested that it might be from her right sacroiliac joint.  She reports a lot of tightness in the right thigh.  She states the pain is constant and feels severe.  She states that this is "a new problem".  She had previous white female injection in April with Dr. Ernestina Patches.  She is not taking medication for pain.  She does have a history of vitamin D supplementation.  Her last MRI scan on the lumbar spinous 2015.  It is painful for her to walk but when she rides it is better.              ROS: All systems reviewed are negative as they relate to the chief complaint within the history of present illness.  Patient denies  fevers or chills.   Assessment & Plan: Visit Diagnoses:  1. Lumbar radiculopathy   2. Chronic right-sided low back pain with right-sided sciatica     Plan: Impression is back and right leg pain which may or may not be related to the sacroiliac joint.  This could be stress reaction or stress fracture in the back or sacrum versus radiculopathy versus sacroiliac joint pathology.  Plan is repeat MRI lumbar spine to make sure this is not some type of sacral stress reaction.  I think that it would be helpful to have a repeat scan just to guide injection treatment.  Plain radiographs of the spine are fairly unremarkable in terms of degenerative disc disease.  Follow-Up Instructions: No Follow-up on file.   Orders:  Orders Placed This Encounter  Procedures  . MR Lumbar Spine w/o contrast   No orders  of the defined types were placed in this encounter.     Procedures: No procedures performed   Clinical Data: No additional findings.  Objective: Vital Signs: There were no vitals taken for this visit.  Physical Exam:   Constitutional: Patient appears well-developed HEENT:  Head: Normocephalic Eyes:EOM are normal Neck: Normal range of motion Cardiovascular: Normal rate Pulmonary/chest: Effort normal Neurologic: Patient is alert Skin: Skin is warm Psychiatric: Patient has normal mood and affect    Ortho Exam: Orthopedic exam demonstrates normal gait and alignment slightly weaker hip flexion on the right compared to left at 4+5.  No groin pain with internal/external rotation of the leg.  Patient has palpable pedal pulses good ankle dorsi and plantar flexion quite hamstring strength.  No other masses lymph adenopathy or skin changes noted in the hip or knee region.  No atrophy is present.  No paresthesias L1 S1 bilaterally  Specialty Comments:  No specialty comments available.  Imaging: No results found.   PMFS History: Patient Active Problem List   Diagnosis Date Noted  . Breast cancer of upper-outer quadrant of left female breast ([place]) 12/07/2014  . Acute recurrent sinusitis 01/17/2013   Past Medical History:  Diagnosis Date  . Abrasion of right leg 12/17/2014  .  Adrenal insufficiency ([place])   . Breast cancer of upper-outer quadrant of left female breast ([place]) 12/07/2014  . Cataract, immature    bilateral  . Dental bridge present    upper - x 2  . Dental crowns present   . Hypothyroidism   . Prediabetes     Family History  Problem Relation Age of Onset  . Diabetes Mother   . Diabetes Father   . Cancer Maternal Uncle        pancreatic cancer   . Diabetes Paternal Grandmother   . Diabetes Paternal Grandfather     Past Surgical History:  Procedure Laterality Date  . ABDOMINOPLASTY    . BALLOON SINUPLASTY    . BREAST LUMPECTOMY WITH RADIOACTIVE SEED AND  SENTINEL LYMPH NODE BIOPSY Left 12/22/2014   Procedure: BREAST LUMPECTOMY WITH RADIOACTIVE SEED AND SENTINEL LYMPH NODE BIOPSY;  Surgeon: Stark Klein, MD;  Location: [facility];  Service: General;  Laterality: Left;  . BUNIONECTOMY Right   . HAMMER TOE SURGERY Bilateral   . RHINOPLASTY    . SEPTOPLASTY     Social History   Occupational History  .  [name]   Social History Main Topics  . Smoking status: Former Smoker    Packs/day: 0.00    Years: 0.00    Quit date: 05/01/1980  . Smokeless tobacco: Never Used  . Alcohol use 0.0 oz/week     Comment: occasionally  . Drug use: No  . Sexual activity: Not on file

## 2016-10-25 DIAGNOSIS — Z01419 Encounter for gynecological examination (general) (routine) without abnormal findings: Secondary | ICD-10-CM | POA: Diagnosis not present

## 2016-10-27 DIAGNOSIS — M5417 Radiculopathy, lumbosacral region: Secondary | ICD-10-CM | POA: Diagnosis not present

## 2016-10-27 DIAGNOSIS — M9905 Segmental and somatic dysfunction of pelvic region: Secondary | ICD-10-CM | POA: Diagnosis not present

## 2016-10-27 DIAGNOSIS — M9902 Segmental and somatic dysfunction of thoracic region: Secondary | ICD-10-CM | POA: Diagnosis not present

## 2016-10-27 DIAGNOSIS — M5137 Other intervertebral disc degeneration, lumbosacral region: Secondary | ICD-10-CM | POA: Diagnosis not present

## 2016-10-27 DIAGNOSIS — M5414 Radiculopathy, thoracic region: Secondary | ICD-10-CM | POA: Diagnosis not present

## 2016-10-27 DIAGNOSIS — M9903 Segmental and somatic dysfunction of lumbar region: Secondary | ICD-10-CM | POA: Diagnosis not present

## 2016-11-02 ENCOUNTER — Ambulatory Visit
Admission: RE | Admit: 2016-11-02 | Discharge: 2016-11-02 | Disposition: A | Discharge status: Home / Self Care | Payer: Medicare Other | Source: Ambulatory Visit | Attending: Orthopedic Surgery | Admitting: Orthopedic Surgery

## 2016-11-02 DIAGNOSIS — M48061 Spinal stenosis, lumbar region without neurogenic claudication: Secondary | ICD-10-CM | POA: Diagnosis not present

## 2016-11-02 DIAGNOSIS — G8929 Other chronic pain: Secondary | ICD-10-CM

## 2016-11-02 DIAGNOSIS — M5416 Radiculopathy, lumbar region: Secondary | ICD-10-CM

## 2016-11-02 DIAGNOSIS — M5441 Lumbago with sciatica, right side: Secondary | ICD-10-CM

## 2016-11-03 DIAGNOSIS — M9905 Segmental and somatic dysfunction of pelvic region: Secondary | ICD-10-CM | POA: Diagnosis not present

## 2016-11-03 DIAGNOSIS — M5414 Radiculopathy, thoracic region: Secondary | ICD-10-CM | POA: Diagnosis not present

## 2016-11-03 DIAGNOSIS — M9903 Segmental and somatic dysfunction of lumbar region: Secondary | ICD-10-CM | POA: Diagnosis not present

## 2016-11-03 DIAGNOSIS — M9902 Segmental and somatic dysfunction of thoracic region: Secondary | ICD-10-CM | POA: Diagnosis not present

## 2016-11-03 DIAGNOSIS — M5137 Other intervertebral disc degeneration, lumbosacral region: Secondary | ICD-10-CM | POA: Diagnosis not present

## 2016-11-03 DIAGNOSIS — M5417 Radiculopathy, lumbosacral region: Secondary | ICD-10-CM | POA: Diagnosis not present

## 2016-11-06 ENCOUNTER — Other Ambulatory Visit: Payer: Self-pay | Admitting: General Surgery

## 2016-11-06 DIAGNOSIS — Z9889 Other specified postprocedural states: Secondary | ICD-10-CM

## 2016-11-08 ENCOUNTER — Encounter (INDEPENDENT_AMBULATORY_CARE_PROVIDER_SITE_OTHER): Payer: Self-pay | Admitting: Orthopedic Surgery

## 2016-11-08 ENCOUNTER — Ambulatory Visit (INDEPENDENT_AMBULATORY_CARE_PROVIDER_SITE_OTHER): Payer: Medicare Other | Admitting: Orthopedic Surgery

## 2016-11-08 DIAGNOSIS — M545 Low back pain, unspecified: Secondary | ICD-10-CM

## 2016-11-08 NOTE — Progress Notes (Signed)
Office Visit Note   Patient: Christina [name]           Date of Birth: 02-Jan-1951           MRN: [id number] Visit Date: 11/08/2016 Requested by: Lollie Sails, MD [address] [place], [place] [address] PCP: Lollie Sails, MD  Subjective: Chief Complaint  Patient presents with  . Lower Back - Pain    HPI: Christina [name] is a 66 year old patient with low back pain.  Last had epidural steroid injection in April.  She describes all right leg symptoms.  She feels like there is a steel band around her thigh.  She believes the pain has moved and is different from the pain for which she received her last injection.  She states it is affecting her balance.  Concern for her to stretch her doing anything anaerobic.  She is taking some over-the-counter medication for this problem.  Since I have seen her she has had an MRI scan which is reviewed.  Essentially it is unchanged in regards to right sided foraminal pathology at L1-2 L2-3 and L3-4              ROS: All systems reviewed are negative as they relate to the chief complaint within the history of present illness.  Patient denies  fevers or chills.   Assessment & Plan: Visit Diagnoses:  1. Lumbar pain     Plan: Impression is right sided leg pain with multiple levels of potential pain generators either L1-2 L2-3 and L3-4.  All these levels have foraminal disc problems.  I like to refer her back to Dr. Ernestina Patches for repeat evaluation with review of the most recent MRI scan.  She feels to consider doing shots at a different location for this different type of pain.  Follow-Up Instructions: Return if symptoms worsen or fail to improve.   Orders:  Orders Placed This Encounter  Procedures  . Ambulatory referral to Physical Medicine Rehab   No orders of the defined types were placed in this encounter.     Procedures: No procedures performed   Clinical Data: No additional findings.  Objective: Vital Signs: There were no vitals  taken for this visit.  Physical Exam:   Constitutional: Patient appears well-developed HEENT:  Head: Normocephalic Eyes:EOM are normal Neck: Normal range of motion Cardiovascular: Normal rate Pulmonary/chest: Effort normal Neurologic: Patient is alert Skin: Skin is warm Psychiatric: Patient has normal mood and affect    Ortho Exam: Orthopedic exam demonstrates no nerve root tension signs good hip flexion abduction and quad and hamstring strength bilaterally with no definite paresthesias.  No muscle atrophy is present.  Ankle dorsiflexion plantar flexion symmetric and intact.  No definite paresthesias L1-S1 bilaterally.  Specialty Comments:  No specialty comments available.  Imaging: No results found.   PMFS History: Patient Active Problem List   Diagnosis Date Noted  . Breast cancer of upper-outer quadrant of left female breast ([facility]) 12/07/2014  . Acute recurrent sinusitis 01/17/2013   Past Medical History:  Diagnosis Date  . Abrasion of right leg 12/17/2014  . Adrenal insufficiency ([facility])   . Breast cancer of upper-outer quadrant of left female breast ([facility]) 12/07/2014  . Cataract, immature    bilateral  . Dental bridge present    upper - x 2  . Dental crowns present   . Hypothyroidism   . Prediabetes     Family History  Problem Relation Age of Onset  . Diabetes Mother   . Diabetes Father   .  Cancer Maternal Uncle        pancreatic cancer   . Diabetes Paternal Grandmother   . Diabetes Paternal Grandfather     Past Surgical History:  Procedure Laterality Date  . ABDOMINOPLASTY    . BALLOON SINUPLASTY    . BREAST LUMPECTOMY WITH RADIOACTIVE SEED AND SENTINEL LYMPH NODE BIOPSY Left 12/22/2014   Procedure: BREAST LUMPECTOMY WITH RADIOACTIVE SEED AND SENTINEL LYMPH NODE BIOPSY;  Surgeon: Stark Klein, MD;  Location: [facility];  Service: General;  Laterality: Left;  . BUNIONECTOMY Right   . HAMMER TOE SURGERY Bilateral   . RHINOPLASTY    .  SEPTOPLASTY     Social History   Occupational History  .  [name]   Social History Main Topics  . Smoking status: Former Smoker    Packs/day: 0.00    Years: 0.00    Quit date: 05/01/1980  . Smokeless tobacco: Never Used  . Alcohol use 0.0 oz/week     Comment: occasionally  . Drug use: No  . Sexual activity: Not on file

## 2016-11-10 DIAGNOSIS — M9905 Segmental and somatic dysfunction of pelvic region: Secondary | ICD-10-CM | POA: Diagnosis not present

## 2016-11-10 DIAGNOSIS — M9903 Segmental and somatic dysfunction of lumbar region: Secondary | ICD-10-CM | POA: Diagnosis not present

## 2016-11-10 DIAGNOSIS — M5414 Radiculopathy, thoracic region: Secondary | ICD-10-CM | POA: Diagnosis not present

## 2016-11-10 DIAGNOSIS — M5137 Other intervertebral disc degeneration, lumbosacral region: Secondary | ICD-10-CM | POA: Diagnosis not present

## 2016-11-10 DIAGNOSIS — M5417 Radiculopathy, lumbosacral region: Secondary | ICD-10-CM | POA: Diagnosis not present

## 2016-11-10 DIAGNOSIS — M9902 Segmental and somatic dysfunction of thoracic region: Secondary | ICD-10-CM | POA: Diagnosis not present

## 2016-11-22 ENCOUNTER — Encounter (INDEPENDENT_AMBULATORY_CARE_PROVIDER_SITE_OTHER): Payer: Self-pay | Admitting: Physical Medicine and Rehabilitation

## 2016-11-22 ENCOUNTER — Ambulatory Visit (INDEPENDENT_AMBULATORY_CARE_PROVIDER_SITE_OTHER): Payer: Medicare Other

## 2016-11-22 ENCOUNTER — Ambulatory Visit (INDEPENDENT_AMBULATORY_CARE_PROVIDER_SITE_OTHER): Payer: Medicare Other | Admitting: Physical Medicine and Rehabilitation

## 2016-11-22 VITALS — BP 120/67 | HR 78

## 2016-11-22 DIAGNOSIS — M5116 Intervertebral disc disorders with radiculopathy, lumbar region: Secondary | ICD-10-CM

## 2016-11-22 MED ORDER — BETAMETHASONE SOD PHOS & ACET 6 (3-3) MG/ML IJ SUSP
12.0000 mg | Freq: Once | INTRAMUSCULAR | Status: AC
  Administered 2016-11-22: 12 mg

## 2016-11-22 MED ORDER — LIDOCAINE HCL (PF) 1 % IJ SOLN
2.0000 mL | Freq: Once | INTRAMUSCULAR | Status: AC
  Administered 2016-11-22: 2 mL

## 2016-11-22 NOTE — Progress Notes (Unsigned)
Fluoro Time: 24 sec MGy: 11.40

## 2016-11-22 NOTE — Progress Notes (Deleted)
Right side low back pain.  Gripping pain around right thigh. Feels like it is affecting her balance. Occasional "light numbness" in thigh.

## 2016-11-22 NOTE — Patient Instructions (Signed)

## 2016-11-23 DIAGNOSIS — J328 Other chronic sinusitis: Secondary | ICD-10-CM | POA: Diagnosis not present

## 2016-11-23 DIAGNOSIS — D508 Other iron deficiency anemias: Secondary | ICD-10-CM | POA: Diagnosis not present

## 2016-11-23 DIAGNOSIS — M545 Low back pain: Secondary | ICD-10-CM | POA: Diagnosis not present

## 2016-11-23 NOTE — Procedures (Signed)
Christina [name] is a 66 year old female who is very active and has chronic low back and usually more right radicular-type pain in the thigh. Previous injections have helped her in the past but most recently she had a situation where injection didn't help as much and she had worsening symptoms. Dr. Marlou Sa who follows her for her orthopedic care did update her lumbar spine MRI. This shows regression of lateral disc protrusion at L1-2 but still present. Most significantly at L3 on the right she does have lateral foraminal protrusion deflecting the L3 nerve root. This is more closely with her symptoms which is posterior buttock pain radiating into the thigh. Conversely if it's not related to her spine she could be getting sacroiliac joint type pain or piriformis pain. We are going to complete a right L3 transforaminal injection today diagnostically and hopefully therapeutically.  Lumbosacral Transforaminal Epidural Steroid Injection - Infraneural Approach with Fluoroscopic Guidance  Patient: Christina [name]      Date of Birth: Sep 26, 1950 MRN: [id number] PCP: Lollie Sails, MD      Visit Date: 11/22/2016   Universal Protocol:     Consent Given By: the patient  Position: PRONE   Additional Comments: Vital signs were monitored before and after the procedure. Patient was prepped and draped in the usual sterile fashion. The correct patient, procedure, and site was verified.   Injection Procedure Details:  Procedure Site One Meds Administered:  Meds ordered this encounter  Medications  . lidocaine (PF) (XYLOCAINE) 1 % injection 2 mL  . betamethasone acetate-betamethasone sodium phosphate (CELESTONE) injection 12 mg      Laterality: Right  Location/Site:  L3-L4  Needle size: 22 G  Needle type: Spinal  Needle Placement: Transforaminal  Findings:  -Contrast Used: 1 mL iohexol 180 mg iodine/mL   -Comments: Excellent flow of contrast along the nerve and into the epidural  space.  Procedure Details: After squaring off the end-plates of the desired vertebral level to get a true AP view, the C-arm was obliqued to the painful side so that the superior articulating process is positioned about 1/3 the length of the inferior endplate.  The needle was aimed toward the junction of the superior articular process and the transverse process of the inferior vertebrae. The needle's initial entry is in the lower third of the foramen through Kambin's triangle. The soft tissues overlying this target were infiltrated with 2-3 ml. of 1% Lidocaine without Epinephrine.  The spinal needle was then inserted and advanced toward the target using a "trajectory" view along the fluoroscope beam.  Under AP and lateral visualization, the needle was advanced so it did not puncture dura and did not traverse medially beyond the 6 o'clock position of the pedicle. Bi-planar projections were used to confirm position. Aspiration was confirmed to be negative for CSF and/or blood. A 1-2 ml. volume of Isovue-250 was injected and flow of contrast was noted at each level. Radiographs were obtained for documentation purposes.   After attaining the desired flow of contrast documented above, a 0.5 to 1.0 ml test dose of 0.25% Marcaine was injected into each respective transforaminal space.  The patient was observed for 90 seconds post injection.  After no sensory deficits were reported, and normal lower extremity motor function was noted,   the above injectate was administered so that equal amounts of the injectate were placed at each foramen (level) into the transforaminal epidural space.   Additional Comments:  The patient tolerated the procedure well Dressing: Band-Aid    Post-procedure  details: Patient was observed during the procedure. Post-procedure instructions were reviewed.  Patient left the clinic in stable condition.

## 2016-11-27 NOTE — Progress Notes (Signed)
[name] [place], [place] [address] Phone: [phone] Subjective:    I'm seeing this patient by the request  of:  Lollie Sails, MD   CC: low back pain.   UDJ:[id number]  [name] is a 66 y.o. female coming in with complaint of low back pain. Patient is had this for quite some time. Has seen another provider recently for him. Did have an MRI in was independently visualized by me. Findings noted below. Patient didn't last week have an epidural steroid injection in the lower back. States that the nerve root injection she was given an L3 was significantly helpful. 95% better. Patient was even able to start riding her horse back again. Mild radiation to the right buttocks but very minimal.      Previous mri 7/5 18 1. Unchanged right foraminal and extraforaminal disc extrusion at L3-4 with right L3 nerve root impingement. 2. Mild right and mild-to-moderate left foraminal stenosis at L4-5. Mildly regressed left foraminal/extraforaminal disc protrusion. 3. Unchanged lateral recess stenosis at L2-3. 4. Unchanged 4 mm right extraforaminal abnormality at L1-2, possiblya regressed disc protrusion or residua of a prior hemorrhage (lesslikely nerve sheath tumor given regression).    Past Medical History:  Diagnosis Date  . Abrasion of right leg 12/17/2014  . Adrenal insufficiency ([place])   . Breast cancer of upper-outer quadrant of left female breast ([place]) 12/07/2014  . Cataract, immature    bilateral  . Dental bridge present    upper - x 2  . Dental crowns present   . Hypothyroidism   . Prediabetes    Past Surgical History:  Procedure Laterality Date  . ABDOMINOPLASTY    . BALLOON SINUPLASTY    . BREAST LUMPECTOMY WITH RADIOACTIVE SEED AND SENTINEL LYMPH NODE BIOPSY Left 12/22/2014   Procedure: BREAST LUMPECTOMY WITH RADIOACTIVE SEED AND SENTINEL LYMPH NODE BIOPSY;  Surgeon: Stark Klein, MD;  Location: [facility];   Service: General;  Laterality: Left;  . BUNIONECTOMY Right   . HAMMER TOE SURGERY Bilateral   . RHINOPLASTY    . SEPTOPLASTY     Social History   Social History  . Marital status: Widowed    Spouse name: [name]  . Number of children: [name]  . Years of education: [name]   Occupational History  .  [name]   Social History Main Topics  . Smoking status: Former Smoker    Packs/day: 0.00    Years: 0.00    Quit date: 05/01/1980  . Smokeless tobacco: Never Used  . Alcohol use 0.0 oz/week     Comment: occasionally  . Drug use: No  . Sexual activity: Not Asked   Other Topics Concern  . None   Social History Narrative  . None   Allergies  Allergen Reactions  . Adhesive [Tape] Other (See Comments)    TEARS SKIN  . Celery Oil Other (See Comments)    FEVER BLISTERS  . Doxycycline Nausea And Vomiting  . Rye Grass Flower Pollen Extract [Gramineae Pollens] Swelling    RYE:  SWELLING THROAT - DENIES SOB  . Sulfa Antibiotics Swelling    NOSE  . Tea Swelling    THROAT - DENIES SOB   Family History  Problem Relation Age of Onset  . Diabetes Mother   . Diabetes Father   . Cancer Maternal Uncle        pancreatic cancer   . Diabetes Paternal Grandmother   . Diabetes Paternal Grandfather  Past medical history, social, surgical and family history all reviewed in electronic medical record.  No pertanent information unless stated regarding to the chief complaint.   Review of Systems:Review of systems updated and as accurate as of 11/29/16  No headache, visual changes, nausea, vomiting, diarrhea, constipation, dizziness, abdominal pain, skin rash, fevers, chills, night sweats, weight loss, swollen lymph nodes, body aches, joint swelling,  chest pain, shortness of breath, mood changes. Positive muscle aches  Objective  Blood pressure (!) 142/70, pulse (!) 102, height 5' 3.5" (1.613 m), weight 102 lb (46.3 kg), SpO2 96 %. Systems examined below as of 11/29/16   General: No  apparent distress alert and oriented x3 mood and affect normal, dressed appropriately. Underweight HEENT: Pupils equal, extraocular movements intact  Respiratory: Patient's speak in full sentences and does not appear short of breath  Cardiovascular: No lower extremity edema, non tender, no erythema  Skin: Hand skin  Abdomen: Soft nontender  Neuro: Cranial nerves II through XII are intact, neurovascularly intact in all extremities with 2+ DTRs and 2+ pulses.  Lymph: No lymphadenopathy of posterior or anterior cervical chain or axillae bilaterally.  Gait normal with good balance and coordination.  MSK:  Non tender with full range of motion and good stability and symmetric strength and tone of shoulders, elbows, wrist, hip, knee and ankles bilaterally.  Back Exam:  Inspection: Loss of lordosis Motion: Flexion 45 deg, Extension 30 deg, Side Bending to 40 deg bilaterally,  Rotation to 45 deg bilaterally  SLR laying: Negative  XSLR laying: Negative  Palpable tenderness: Mild tenderness in the lumbosacral area bilaterally. FABER: Mild tightness of the right. Sensory change: Gross sensation intact to all lumbar and sacral dermatomes.  Reflexes: 2+ at both patellar tendons, 2+ at achilles tendons, Babinski's downgoing.  Strength at foot  Plantar-flexion: 5/5 Dorsi-flexion: 5/5 Eversion: 5/5 Inversion: 5/5  Leg strength  Quad: 5/5 Hamstring: 5/5 Hip flexor: 5/5 Hip abductors: 4/5 and symmetric Gait unremarkable.  Procedure note 94765; 15 additional minutes spent for Therapeutic exercises as stated in above notes.  This included exercises focusing on stretching, strengthening, with significant focus on eccentric aspects.   Long term goals include an improvement in range of motion, strength, endurance as well as avoiding reinjury. Patient's frequency would include in 1-2 times a day, 3-5 times a week for a duration of 6-12 weeks. Low back exercises that included:  Pelvic tilt/bracing instruction to  focus on control of the pelvic girdle and lower abdominal muscles  Glute strengthening exercises, focusing on proper firing of the glutes without engaging the low back muscles Proper stretching techniques for maximum relief for the hamstrings, hip flexors, low back and some rotation where tolerated   Proper technique shown and discussed handout in great detail with ATC.  All questions were discussed and answered.     Impression and Recommendations:     This case required medical decision making of moderate complexity.      Note: This dictation was prepared with Dragon dictation along with smaller phrase technology. Any transcriptional errors that result from this process are unintentional.

## 2016-11-29 ENCOUNTER — Ambulatory Visit (INDEPENDENT_AMBULATORY_CARE_PROVIDER_SITE_OTHER): Payer: Medicare Other | Admitting: Family Medicine

## 2016-11-29 ENCOUNTER — Encounter: Payer: Self-pay | Admitting: Family Medicine

## 2016-11-29 DIAGNOSIS — S3421XA Injury of nerve root of lumbar spine, initial encounter: Secondary | ICD-10-CM

## 2016-11-29 MED ORDER — GABAPENTIN 100 MG PO CAPS: 200.0000 mg | ORAL_CAPSULE | Freq: Every day | ORAL | 3 refills | Status: DC

## 2016-11-29 NOTE — Patient Instructions (Addendum)
Good to see you  Happy Birthday! Ice 20 minutes 2 times daily. Usually after activity and before bed. Exercises 3 times a week.  Continue the vitamins you are doing Consider adding Tart cherry extract any dose at night Possibly try gabapentin 100-200mg  at night  Stretch after riding.  [employer] orthotics "total support" online would be great  Vega sport protein daily to try to increase protein.  See me again in 4 weeks.

## 2016-11-29 NOTE — Assessment & Plan Note (Signed)
Patient does have imaging of patient's lumbar spine that did show an L3 nerve root impingement and patient did respond well to the epidural injection. Patient is doing well overall. Patient given home exercises today. We discussed different treatments over-the-counter that can be beneficial. We discussed icing regimen. The patient does well she can follow-up again in 6-8 weeks. I think she will do well without significant difficulty.

## 2016-12-05 DIAGNOSIS — R5382 Chronic fatigue, unspecified: Secondary | ICD-10-CM | POA: Diagnosis not present

## 2016-12-05 DIAGNOSIS — E032 Hypothyroidism due to medicaments and other exogenous substances: Secondary | ICD-10-CM | POA: Diagnosis not present

## 2016-12-06 ENCOUNTER — Telehealth: Payer: Self-pay | Admitting: Family Medicine

## 2016-12-06 NOTE — Telephone Encounter (Signed)
Patient states she was informed to get a insoles for her shoes. But she wanted to know which foot she was supposed to use them on. I did not see it in the notes. Can you please advise. Thank you.

## 2016-12-06 NOTE — Telephone Encounter (Signed)
Discussed with pt

## 2016-12-06 NOTE — Telephone Encounter (Signed)
[employer] orthotics "total support" online would be great  Wear them in both, otherwise only on the right side.

## 2016-12-11 ENCOUNTER — Ambulatory Visit
Admission: RE | Admit: 2016-12-11 | Discharge: 2016-12-11 | Disposition: A | Discharge status: Home / Self Care | Payer: Medicare Other | Source: Ambulatory Visit | Attending: General Surgery | Admitting: General Surgery

## 2016-12-11 DIAGNOSIS — Z9889 Other specified postprocedural states: Secondary | ICD-10-CM

## 2016-12-11 DIAGNOSIS — R928 Other abnormal and inconclusive findings on diagnostic imaging of breast: Secondary | ICD-10-CM | POA: Diagnosis not present

## 2016-12-21 ENCOUNTER — Emergency Department (HOSPITAL_COMMUNITY): Payer: Medicare Other

## 2016-12-21 ENCOUNTER — Emergency Department (HOSPITAL_COMMUNITY)
Admission: EM | Admit: 2016-12-21 | Discharge: 2016-12-21 | Disposition: A | Discharge status: Home / Self Care | Payer: Medicare Other | Attending: Emergency Medicine | Admitting: Emergency Medicine

## 2016-12-21 ENCOUNTER — Encounter (HOSPITAL_COMMUNITY): Payer: Self-pay | Admitting: Emergency Medicine

## 2016-12-21 DIAGNOSIS — L03113 Cellulitis of right upper limb: Secondary | ICD-10-CM | POA: Insufficient documentation

## 2016-12-21 DIAGNOSIS — Z87891 Personal history of nicotine dependence: Secondary | ICD-10-CM | POA: Insufficient documentation

## 2016-12-21 DIAGNOSIS — Z853 Personal history of malignant neoplasm of breast: Secondary | ICD-10-CM | POA: Diagnosis not present

## 2016-12-21 DIAGNOSIS — R7303 Prediabetes: Secondary | ICD-10-CM | POA: Diagnosis not present

## 2016-12-21 LAB — CBC WITH DIFFERENTIAL/PLATELET
Basophils Absolute: 0 10*3/uL (ref 0.0–0.1)
Basophils Relative: 0 %
Eosinophils Absolute: 0.1 10*3/uL (ref 0.0–0.7)
Eosinophils Relative: 1 %
HCT: 34 % — ABNORMAL LOW (ref 36.0–46.0)
Hemoglobin: 11.4 g/dL — ABNORMAL LOW (ref 12.0–15.0)
Lymphocytes Relative: 12 %
Lymphs Abs: 1.4 10*3/uL (ref 0.7–4.0)
MCH: 31.1 pg (ref 26.0–34.0)
MCHC: 33.5 g/dL (ref 30.0–36.0)
MCV: 92.6 fL (ref 78.0–100.0)
Monocytes Absolute: 0.9 10*3/uL (ref 0.1–1.0)
Monocytes Relative: 8 %
Neutro Abs: 9.5 10*3/uL — ABNORMAL HIGH (ref 1.7–7.7)
Neutrophils Relative %: 79 %
Platelets: 229 10*3/uL (ref 150–400)
RBC: 3.67 MIL/uL — ABNORMAL LOW (ref 3.87–5.11)
RDW: 13.9 % (ref 11.5–15.5)
WBC: 11.9 10*3/uL — ABNORMAL HIGH (ref 4.0–10.5)

## 2016-12-21 LAB — BASIC METABOLIC PANEL
Anion gap: 9 (ref 5–15)
BUN: 12 mg/dL (ref 6–20)
CO2: 23 mmol/L (ref 22–32)
Calcium: 8.8 mg/dL — ABNORMAL LOW (ref 8.9–10.3)
Chloride: 105 mmol/L (ref 101–111)
Creatinine, Ser: 0.58 mg/dL (ref 0.44–1.00)
GFR calc Af Amer: >60 mL/min (ref >60)
GFR calc non Af Amer: >60 mL/min (ref >60)
Glucose, Bld: 136 mg/dL — ABNORMAL HIGH (ref 65–99)
Potassium: 3.5 mmol/L (ref 3.5–5.1)
Sodium: 137 mmol/L (ref 135–145)

## 2016-12-21 MED ORDER — CLINDAMYCIN HCL 150 MG PO CAPS: 450.0000 mg | ORAL_CAPSULE | Freq: Four times a day (QID) | ORAL | 0 refills | Status: AC

## 2016-12-21 MED ORDER — CLINDAMYCIN HCL 150 MG PO CAPS
450.0000 mg | ORAL_CAPSULE | Freq: Once | ORAL | Status: AC
  Administered 2016-12-21: 450 mg via ORAL
  Filled 2016-12-21: qty 1

## 2016-12-21 NOTE — ED Triage Notes (Signed)
Pt states that she was seen yesterday for cellulitis of the hand at her PCP and has started keflex but the swelling and redness has persisted and worsened. Alert and oriented.

## 2016-12-21 NOTE — Discharge Instructions (Signed)
You have been seen today in the Emergency Department (ED) for cellulitis, a superficial skin infection. Please take your antibiotics as prescribed for their ENTIRE prescribed duration.  Take Tylenol or Motrin as needed for pain, but only as written on the box.  ° °Please follow up with your doctor or in the ED in 24-48 hours for recheck of your infection if you are not improving.  Call your doctor sooner or return to the ED if you develop worsening signs of infection such as: increased redness, increased pain, pus, fever, or other symptoms that concern you. ° °

## 2016-12-21 NOTE — ED Provider Notes (Signed)
Emergency Department Provider Note   I have reviewed the triage vital signs and the nursing notes.   HISTORY  Chief Complaint Cellulitis   HPI Christina [name] is a 66 y.o. female with PMH of adrenal insufficiency presents to the emergency department for evaluation of worsening right hand pain, redness, swelling. The patient states she scratched her right ring finger on something 2 weeks ago. There is a scab that she did not seek treatment because it was not severe and she is not having significant discomfort. 2 days ago she developed redness on the top of the right hand and went to urgent care for evaluation. She was started on Keflex for cellulitis. Patient is taken 4 doses yesterday and some this AM but feels the redness and swelling is worse this morning compared to yesterday. She denies any fevers or chills. No drainage from the finger abrasion. She has discomfort when moving her fingers which she attributes to swelling. No numbness or tingling. No radiation of pain.    Past Medical History:  Diagnosis Date  . Abrasion of right leg 12/17/2014  . Adrenal insufficiency ([place])   . Breast cancer of upper-outer quadrant of left female breast ([place]) 12/07/2014  . Cataract, immature    bilateral  . Dental bridge present    upper - x 2  . Dental crowns present   . Hypothyroidism   . Prediabetes     Patient Active Problem List   Diagnosis Date Noted  . 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 Breast cancer of upper-outer quadrant of left female breast ([place]) 12/07/2014  . Acute recurrent sinusitis 01/17/2013    Past Surgical History:  Procedure Laterality Date  . ABDOMINOPLASTY    . BALLOON SINUPLASTY    . BREAST LUMPECTOMY WITH RADIOACTIVE SEED AND SENTINEL LYMPH NODE BIOPSY Left 12/22/2014   Procedure: BREAST LUMPECTOMY WITH RADIOACTIVE SEED AND SENTINEL LYMPH NODE BIOPSY;  Surgeon: Stark Klein, MD;  Location: [facility];  Service: General;   Laterality: Left;  . BUNIONECTOMY Right   . HAMMER TOE SURGERY Bilateral   . RHINOPLASTY    . SEPTOPLASTY      Current Outpatient Rx  . Order #: [id number] Class: Historical Med  . Order #: [id number] Class: Historical Med  . Order #: [id number] Class: Historical Med  . Order #: [id number] Class: Historical Med  . Order #: [id number] Class: Historical Med  . Order #: [id number] Class: Historical Med  . Order #: [id number] Class: Historical Med  . Order #: [id number] Class: Historical Med  . Order #: [id number] Class: Historical Med  . Order #: [id number] Class: Historical Med  . Order #: [id number] Class: Historical Med  . Order #: [id number] Class: Historical Med  . Order #: [id number] Class: Historical Med  . Order #: [id number] Class: Historical Med  . Order #: [id number] Class: Historical Med  . Order #: [id number] Class: Historical Med  . Order #: [id number] Class: Historical Med  . Order #: [id number] Class: Historical Med  . Order #: [id number] Class: Historical Med  . Order #: [id number] Class: Historical Med  . Order #: [id number] Class: Historical Med  . Order #: [id number] Class: Historical Med  . Order #: [id number] Class: Historical Med  . Order #: [id number] Class: Historical Med  . Order #: [id number] Class: Historical Med  . Order #: [id number] Class: Print  . Order #: [id number] Class: Historical Med  . Order #: [id number] Class: Historical Med  . Order #: [id number] Class: Historical Med  . Order #: [id number] Class: Historical Med  . Order #: [id number] Class: Print  . Order #: [id number] Class: Print  Allergies Adhesive [tape]; Celery oil; Doxycycline; Rye grass flower pollen extract [gramineae pollens]; Sulfa antibiotics; and Tea  Family History  Problem Relation Age of Onset  . Diabetes Mother   . Diabetes Father   . Cancer Maternal Uncle        pancreatic cancer   . Diabetes Paternal Grandmother   . Diabetes Paternal Grandfather     Social History Social History  Substance Use Topics  . Smoking  status: Former Smoker    Packs/day: 0.00    Years: 0.00    Quit date: 05/01/1980  . Smokeless tobacco: Never Used  . Alcohol use 0.0 oz/week     Comment: occasionally    Review of Systems  Constitutional: No fever/chills Eyes: No visual changes. ENT: No sore throat. Cardiovascular: Denies chest pain. Respiratory: Denies shortness of breath. Gastrointestinal: No abdominal pain.  No nausea, no vomiting.  No diarrhea.  No constipation. Genitourinary: Negative for dysuria. Musculoskeletal: Negative for back pain. Skin: Right hand rash and hand swelling.  Neurological: Negative for headaches, focal weakness or numbness.  10-point ROS otherwise negative.  ____________________________________________   PHYSICAL EXAM:  VITAL SIGNS: ED Triage Vitals  Enc Vitals Group     BP 12/21/16 0850 (!) 142/81     Pulse Rate 12/21/16 0850 88     Resp 12/21/16 0850 18     Temp 12/21/16 0850 (!) 97.4 F (36.3 C)     Temp Source 12/21/16 0850 Oral     SpO2 12/21/16 0850 99 %     Pain Score 12/21/16 0849 4   Constitutional: Alert and oriented. Well appearing and in no acute distress. Eyes: Conjunctivae are normal.  Head: Atraumatic. Nose: No congestion/rhinnorhea. Mouth/Throat: Mucous membranes are moist.   Neck: No stridor.   Cardiovascular: Normal rate, regular rhythm. Good peripheral circulation. Grossly normal heart sounds.   Respiratory: Normal respiratory effort.  No retractions. Lungs CTAB. Gastrointestinal: Soft and nontender. No distention.  Musculoskeletal: No lower extremity tenderness nor edema. No gross deformities of extremities. Normal ROM of the right wrist and fingers. No joint tenderness to palpation.  Neurologic:  Normal speech and language. No gross focal neurologic deficits are appreciated.  Skin:  Skin is warm and dry. Redness over the dorsum of the right hand. Abrasion to the dorsum of the proximal 4th phalanx without discharge.    ____________________________________________   LABS (all labs ordered are listed, but only abnormal results are displayed)  Labs Reviewed  BASIC METABOLIC PANEL - Abnormal; Notable for the following:       Result Value   Glucose, Bld 136 (*)    Calcium 8.8 (*)    All other components within normal limits  CBC WITH DIFFERENTIAL/PLATELET - Abnormal; Notable for the following:    WBC 11.9 (*)    RBC 3.67 (*)    Hemoglobin 11.4 (*)    HCT 34.0 (*)    Neutro Abs 9.5 (*)    All other components within normal limits  CULTURE, BLOOD (ROUTINE X 2)  CULTURE, BLOOD (ROUTINE X 2)   ____________________________________________  RADIOLOGY  Dg Hand Complete Right  Result Date: 12/21/2016 CLINICAL DATA:  Right hand cellulitis EXAM: RIGHT HAND - COMPLETE 3+ VIEW COMPARISON:  None. FINDINGS: Probable dorsal hand soft tissue swelling that is mild. No opaque foreign body or soft tissue gas. No evidence of bony erosion. No fracture or dislocation. Osteoarthritis of the interphalangeal and first/third MCP joints primarily. Third MCP and index DIP osteoarthritis is particularly advanced. Osteopenia. IMPRESSION: 1. History  of cellulitis. No opaque foreign body or soft tissue gas. No acute osseous finding. 2. Osteopenia and osteoarthritis. Electronically Signed   By: Monte Fantasia M.D.   On: 12/21/2016 10:39    ____________________________________________   PROCEDURES  Procedure(s) performed:   Procedures  None ____________________________________________   INITIAL IMPRESSION / ASSESSMENT AND PLAN / ED COURSE  Pertinent labs & imaging results that were available during my care of the patient were reviewed by me and considered in my medical decision making (see chart for details).  Patient presents to the emergency department for evaluation of cellulitis over the dorsum of the right hand. She's been on 24 hours of Keflex and feels that symptoms are worsening slightly. No evidence on exam  to suggest septic arthritis. The patient has no systemic signs or symptoms of infection. Plan for x-ray to evaluate for any possible bone involvement. Will obtain blood cultures and lab work.   11:28 AM Labs with only mild leukocytosis. With only 24 hours of Keflex it is difficult to say if patient has truly failed outpatient therapy. No DM history. No reason to suspect deeper infection or abscess. No deep for surgery referral at this time. Plan to start Clindamycin and have patient f/u with either PCP or return for 24 hour re-evaluation. Patient in agreement with this at this time.   At this time, I do not feel there is any life-threatening condition present. I have reviewed and discussed all results (EKG, imaging, lab, urine as appropriate), exam findings with patient. I have reviewed nursing notes and appropriate previous records.  I feel the patient is safe to be discharged home without further emergent workup. Discussed usual and customary return precautions. Patient and family (if present) verbalize understanding and are comfortable with this plan.  Patient will follow-up with their primary care provider. If they do not have a primary care provider, information for follow-up has been provided to them. All questions have been answered.  ____________________________________________  FINAL CLINICAL IMPRESSION(S) / ED DIAGNOSES  Final diagnoses:  Cellulitis of right upper extremity     MEDICATIONS GIVEN DURING THIS VISIT:  Medications  clindamycin (CLEOCIN) capsule 450 mg (450 mg Oral Given 12/21/16 1157)     NEW OUTPATIENT MEDICATIONS STARTED DURING THIS VISIT:  Discharge Medication List as of 12/21/2016 11:32 AM    START taking these medications   Details  clindamycin (CLEOCIN) 150 MG capsule Take 3 capsules (450 mg total) by mouth every 6 (six) hours., Starting Thu 12/21/2016, Until Thu 12/28/2016, Print        Note:  This document was prepared using Dragon voice recognition  software and may include unintentional dictation errors.  Nanda Quinton, MD Emergency Medicine    Kelsie Zaborowski, [facility], MD 12/21/16 [phone]

## 2016-12-22 ENCOUNTER — Emergency Department (HOSPITAL_COMMUNITY)
Admission: EM | Admit: 2016-12-22 | Discharge: 2016-12-22 | Disposition: A | Discharge status: Home / Self Care | Payer: Medicare Other | Attending: Emergency Medicine | Admitting: Emergency Medicine

## 2016-12-22 ENCOUNTER — Encounter (HOSPITAL_COMMUNITY): Payer: Self-pay | Admitting: Emergency Medicine

## 2016-12-22 DIAGNOSIS — Z09 Encounter for follow-up examination after completed treatment for conditions other than malignant neoplasm: Secondary | ICD-10-CM | POA: Insufficient documentation

## 2016-12-22 DIAGNOSIS — Z87891 Personal history of nicotine dependence: Secondary | ICD-10-CM | POA: Insufficient documentation

## 2016-12-22 DIAGNOSIS — Z4800 Encounter for change or removal of nonsurgical wound dressing: Secondary | ICD-10-CM | POA: Diagnosis not present

## 2016-12-22 DIAGNOSIS — Z79899 Other long term (current) drug therapy: Secondary | ICD-10-CM | POA: Diagnosis not present

## 2016-12-22 DIAGNOSIS — Z5189 Encounter for other specified aftercare: Secondary | ICD-10-CM

## 2016-12-22 NOTE — ED Triage Notes (Signed)
Patient reports being seen here yesterday for cellulitis in right hand. Patient was told to come back today for re-check of wound.  Patient reports taking her antibiotics and swelling in hand has gone down and now patient is able to make a fist with her right hand.

## 2016-12-22 NOTE — ED Notes (Signed)
Bed: WA19 Expected date:  Expected time:  Means of arrival:  Comments: 

## 2016-12-22 NOTE — Discharge Instructions (Signed)
Please complete your course of antibiotics and return here for any problems

## 2016-12-22 NOTE — ED Provider Notes (Signed)
[facility] DEPT Provider Note   CSN: [id number] Arrival date & time: 12/22/16  6294     History   Chief Complaint Chief Complaint  Patient presents with  . hand infection re-check    HPI Christina [name] is a 66 y.o. female.  65 year old female who presents for recheck of right hand cellulitis. Seen here yesterday and had her antibiotics switched to minimize him. States that her symptoms have improved and that she has more mobility of her right hand. Denies any fever or chills. No drainage noted.      Past Medical History:  Diagnosis Date  . Abrasion of right leg 12/17/2014  . Adrenal insufficiency ([place])   . Breast cancer of upper-outer quadrant of left female breast ([place]) 12/07/2014  . Cataract, immature    bilateral  . Dental bridge present    upper - x 2  . Dental crowns present   . Hypothyroidism   . Prediabetes     Patient Active Problem List   Diagnosis Date Noted  . 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 Breast cancer of upper-outer quadrant of left female breast ([place]) 12/07/2014  . Acute recurrent sinusitis 01/17/2013    Past Surgical History:  Procedure Laterality Date  . ABDOMINOPLASTY    . BALLOON SINUPLASTY    . BREAST LUMPECTOMY WITH RADIOACTIVE SEED AND SENTINEL LYMPH NODE BIOPSY Left 12/22/2014   Procedure: BREAST LUMPECTOMY WITH RADIOACTIVE SEED AND SENTINEL LYMPH NODE BIOPSY;  Surgeon: Stark Klein, MD;  Location: [facility];  Service: General;  Laterality: Left;  . BUNIONECTOMY Right   . HAMMER TOE SURGERY Bilateral   . RHINOPLASTY    . SEPTOPLASTY      OB History    No data available       Home Medications    Prior to Admission medications   Medication Sig Start Date End Date Taking? Authorizing Provider  albuterol (PROVENTIL HFA;VENTOLIN HFA) 108 (90 BASE) MCG/ACT inhaler Inhale into the lungs every 6 (six) hours as needed for wheezing or shortness of breath.   Yes [provider]  Alpha  Lipoic Acid 200 MG CAPS Take 600 mg by mouth 2 (two) times daily.    Yes [provider]  Ascorbic Acid (VITAMIN C) 1000 MG tablet Take 2,000-3,000 mg by mouth 2 (two) times daily. Takes 2000mg  in the morning and 3000mg  at night   Yes [provider]  calcium carbonate (OS-CAL) 600 MG TABS tablet Take 800 mg by mouth 2 (two) times daily with a meal.   Yes [provider]  cetirizine (ZYRTEC) 10 MG tablet Take 10 mg by mouth daily.    Yes [provider]  clindamycin (CLEOCIN) 150 MG capsule Take 3 capsules (450 mg total) by mouth every 6 (six) hours. Patient taking differently: Take 450 mg by mouth every 6 (six) hours. Started 08/23 12/21/16 12/28/16 Yes Long, Wonda Olds, MD  clonazePAM (KLONOPIN) 1 MG tablet Take 1 mg by mouth at bedtime.  11/20/12  Yes [provider]  Coenzyme Q10 200 MG capsule Take 200 mg by mouth daily.   Yes [provider]  cyclobenzaprine (FLEXERIL) 10 MG tablet Take 10 mg by mouth at bedtime. 11/11/14  Yes [provider]  Eszopiclone (ESZOPICLONE) 3 MG TABS Take 1.5 mg by mouth at bedtime. Take immediately before bedtime   Yes [provider]  Flaxseed, Linseed, (FLAXSEED OIL) 1000 MG CAPS Take 1,000 mg by mouth daily.    Yes  [provider]  fluticasone (FLONASE) 50 MCG/ACT nasal spray Place 1 spray into both nostrils daily. 02/23/15  Yes [provider]  guaiFENesin (MUCINEX) 600 MG 12 hr tablet Take 600 mg by mouth daily.    Yes [provider]  ibuprofen (ADVIL,MOTRIN) 200 MG tablet Take 600 mg by mouth every 4 (four) hours as needed for fever, headache, mild pain, moderate pain or cramping.   Yes [provider]  liothyronine (CYTOMEL) 25 MCG tablet Take 50 mcg by mouth daily. 02/03/15  Yes [provider]  MAGNESIUM CITRATE PO Take 2 tablets by mouth daily.   Yes [provider]  MAGNESIUM LACTATE PO Take 2 tablets by mouth at bedtime.   Yes  [provider]  metFORMIN (GLUCOPHAGE-XR) 500 MG 24 hr tablet Take 500-1,000 mg by mouth 3 (three) times daily. Takes 1000mg  in the morning, 500mg  at lunch, and 500mg  with supper 02/08/15  Yes [provider]  methylPREDNISolone (MEDROL) 4 MG tablet Take 2.5-4 mg by mouth 3 (three) times daily. Takes 4mg  in the morning and 2.5mg  midday and at bedtime   Yes [provider]  montelukast (SINGULAIR) 10 MG tablet Take 10 mg by mouth at bedtime.   Yes [provider]  niacin 500 MG tablet Take 500 mg by mouth daily.    Yes [provider]  Nutritional Supplements (SYTRINOL PO) Take 1 tablet by mouth daily.    Yes [provider]  OVER THE COUNTER MEDICATION Take 1-2 tablets by mouth 2 (two) times daily. *Nutrient 950 with NAC2* takes 2 tablets in the morning and 1 tablet at lunchtime   Yes [provider]  OVER THE COUNTER MEDICATION Take 2 tablets by mouth daily with breakfast. *LV GB Complex*   Yes [provider]  OVER THE COUNTER MEDICATION Take 1 tablet by mouth 2 (two) times daily. *Regenemax or Biosil*   Yes [provider]  OVER THE COUNTER MEDICATION Take 30 mg by mouth daily. *Borotab*   Yes [provider]  OVER THE COUNTER MEDICATION Take 1 capsule by mouth daily. *Lypozyme*   Yes [provider]  oxyCODONE-acetaminophen (ROXICET) 5-325 MG per tablet Take 1-2 tablets by mouth every 4 (four) hours as needed for severe pain. Patient taking differently: Take 0.25-1 tablets by mouth every 4 (four) hours as needed for severe pain.  12/22/14  Yes Stark Klein, MD  potassium chloride (MICRO-K) 10 MEQ CR capsule Take 2 tabs by mouth twice daily. 01/30/13  Yes [provider]  STRONTIUM GLUCONATE-B6-B12-FA PO Take 2 tablets by mouth daily.    Yes [provider]  Vitamin D, Ergocalciferol, (DRISDOL) 50000 units CAPS capsule Take 50,000 Units by mouth every Sunday.   Yes [provider]  VITAMIN K, PHYTONADIONE, PO Take 2 tablets by mouth daily.    Yes [provider]  zinc gluconate 50 MG tablet Take 50-100 mg by mouth 2 (two) times daily. Takes 100mg  in the morning and 50mg  later in the day   Yes [provider]  gabapentin (NEURONTIN) 100 MG capsule Take 2 capsules (200 mg total) by mouth at bedtime. Patient not taking: Reported on 12/21/2016 11/29/16   Lyndal Pulley, DO    Family History Family History  Problem Relation Age of Onset  . Diabetes Mother   . Diabetes Father   . Cancer Maternal Uncle        pancreatic cancer   . Diabetes Paternal Grandmother   . Diabetes Paternal Grandfather  Social History Social History  Substance Use Topics  . Smoking status: Former Smoker    Packs/day: 0.00    Years: 0.00    Quit date: 05/01/1980  . Smokeless tobacco: Never Used  . Alcohol use 0.0 oz/week     Comment: occasionally     Allergies   Adhesive [tape]; Celery oil; Doxycycline; Rye grass flower pollen extract [gramineae pollens]; Sulfa antibiotics; and Tea   Review of Systems Review of Systems  All other systems reviewed and are negative.    Physical Exam Updated Vital Signs BP 130/72 (BP Location: Right Arm)   Pulse 90   Temp 97.8 F (36.6 C) (Oral)   Resp 16   SpO2 100%   Physical Exam  Constitutional: She is oriented to person, place, and time. She appears well-developed and well-nourished.  Non-toxic appearance.  HENT:  Head: Normocephalic and atraumatic.  Eyes: Pupils are equal, round, and reactive to light. Conjunctivae are normal.  Neck: Normal range of motion.  Cardiovascular: Normal rate.   Pulmonary/Chest: Effort normal.  Musculoskeletal:       Hands: Neurological: She is alert and oriented to person, place, and time.  Skin: Skin is warm and dry.  Psychiatric: She has a normal mood and affect.  Nursing note and vitals reviewed.    ED Treatments / Results  Labs (all labs ordered are listed,  but only abnormal results are displayed) Labs Reviewed - No data to display  EKG  EKG Interpretation None       Radiology Dg Hand Complete Right  Result Date: 12/21/2016 CLINICAL DATA:  Right hand cellulitis EXAM: RIGHT HAND - COMPLETE 3+ VIEW COMPARISON:  None. FINDINGS: Probable dorsal hand soft tissue swelling that is mild. No opaque foreign body or soft tissue gas. No evidence of bony erosion. No fracture or dislocation. Osteoarthritis of the interphalangeal and first/third MCP joints primarily. Third MCP and index DIP osteoarthritis is particularly advanced. Osteopenia. IMPRESSION: 1. History of cellulitis. No opaque foreign body or soft tissue gas. No acute osseous finding. 2. Osteopenia and osteoarthritis. Electronically Signed   By: Monte Fantasia M.D.   On: 12/21/2016 10:39    Procedures Procedures (including critical care time)  Medications Ordered in ED Medications - No data to display   Initial Impression / Assessment and Plan / ED Course  I have reviewed the triage vital signs and the nursing notes.  Pertinent labs & imaging results that were available during my care of the patient were reviewed by me and considered in my medical decision making (see chart for details).     Patient's infection appears to be healing well at this time. We'll continue course of antibiotics and return precautions given  Final Clinical Impressions(s) / ED Diagnoses   Final diagnoses:  Visit for wound check    New Prescriptions New Prescriptions   No medications on file     Lacretia Leigh, MD 12/22/16 [phone]

## 2016-12-25 DIAGNOSIS — Z85828 Personal history of other malignant neoplasm of skin: Secondary | ICD-10-CM | POA: Diagnosis not present

## 2016-12-25 DIAGNOSIS — L570 Actinic keratosis: Secondary | ICD-10-CM | POA: Diagnosis not present

## 2016-12-25 DIAGNOSIS — D045 Carcinoma in situ of skin of trunk: Secondary | ICD-10-CM | POA: Diagnosis not present

## 2016-12-25 DIAGNOSIS — D485 Neoplasm of uncertain behavior of skin: Secondary | ICD-10-CM | POA: Diagnosis not present

## 2016-12-25 DIAGNOSIS — C44519 Basal cell carcinoma of skin of other part of trunk: Secondary | ICD-10-CM | POA: Diagnosis not present

## 2016-12-25 DIAGNOSIS — L821 Other seborrheic keratosis: Secondary | ICD-10-CM | POA: Diagnosis not present

## 2016-12-26 LAB — CULTURE, BLOOD (ROUTINE X 2)
Culture: NO GROWTH
Special Requests: ADEQUATE

## 2016-12-27 ENCOUNTER — Encounter: Payer: Self-pay | Admitting: Family Medicine

## 2016-12-27 ENCOUNTER — Ambulatory Visit (INDEPENDENT_AMBULATORY_CARE_PROVIDER_SITE_OTHER): Payer: Medicare Other | Admitting: Family Medicine

## 2016-12-27 VITALS — BP 102/70 | HR 78 | Ht 63.5 in | Wt 103.0 lb

## 2016-12-27 DIAGNOSIS — S3421XA Injury of nerve root of lumbar spine, initial encounter: Secondary | ICD-10-CM

## 2016-12-27 MED ORDER — NORTRIPTYLINE HCL 10 MG PO CAPS: 10.0000 mg | ORAL_CAPSULE | Freq: Every day | ORAL | 1 refills | Status: DC

## 2016-12-27 NOTE — Assessment & Plan Note (Signed)
Continues to have pain is significant. Patient is underweight and is still not eating significant amount. Encourage patient to take the medications regularly as well. We'll try an L3 nerve root injection which patient has responded to previously and an outside location. We discussed icing regimen, declined any type of pain medication. Patient encouraged to get another primary care provider needed. Patient will come back and see me 2-3 weeks after the injection

## 2016-12-27 NOTE — Progress Notes (Signed)
[name] [name], [name] [address] Phone: [phone] Subjective:    I'm seeing this patient by the request  of:  Christina Sails, Christina [name]   CC: low back pain.   [id number]  [name] is a 66 y.o. female coming in with complaint of low back pain. Severe degenerative changes of the lumbar spine. Patient has had difficulty with this for quite some time. Worsening symptoms with the radicular symptoms down the lateral aspect of the hip but never past the knee at the moment. Patient states unable to walk long distances without pain. If anything seems to be worsening.      Previous mri 7/5 18 1. Unchanged right foraminal and extraforaminal disc extrusion at L3-4 with right L3 nerve root impingement. 2. Mild right and mild-to-moderate left foraminal stenosis at L4-5. Mildly regressed left foraminal/extraforaminal disc protrusion. 3. Unchanged lateral recess stenosis at L2-3. 4. Unchanged 4 mm right extraforaminal abnormality at L1-2, possiblya regressed disc protrusion or residua of a prior hemorrhage (lesslikely nerve sheath tumor given regression).    Past Medical History:  Diagnosis Date  . Abrasion of right leg 12/17/2014  . Adrenal insufficiency ([facility])   . Breast cancer of upper-outer quadrant of left female breast ([name]) 12/07/2014  . Cataract, immature    bilateral  . Dental bridge present    upper - x 2  . Dental crowns present   . Hypothyroidism   . Prediabetes    Past Surgical History:  Procedure Laterality Date  . ABDOMINOPLASTY    . BALLOON SINUPLASTY    . BREAST LUMPECTOMY WITH RADIOACTIVE SEED AND SENTINEL LYMPH NODE BIOPSY Left 12/22/2014   Procedure: BREAST LUMPECTOMY WITH RADIOACTIVE SEED AND SENTINEL LYMPH NODE BIOPSY;  Surgeon: Stark Klein, Christina [name];  Location: [facility];  Service: General;  Laterality: Left;  . BUNIONECTOMY Right   . HAMMER TOE SURGERY Bilateral   . RHINOPLASTY    . SEPTOPLASTY      Social History   Social History  . Marital status: Widowed    Spouse name: [name]  . Number of children: [name]  . Years of education: [name]   Occupational History  .  [name]   Social History Main Topics  . Smoking status: Former Smoker    Packs/day: 0.00    Years: 0.00    Quit date: 05/01/1980  . Smokeless tobacco: Never Used  . Alcohol use 0.0 oz/week     Comment: occasionally  . Drug use: No  . Sexual activity: Not Asked   Other Topics Concern  . None   Social History Narrative  . None   Allergies  Allergen Reactions  . Adhesive [Tape] Other (See Comments)    TEARS SKIN  . Celery Oil Other (See Comments)    FEVER BLISTERS  . Doxycycline Nausea And Vomiting  . Rye Grass Flower Pollen Extract [Gramineae Pollens] Swelling    RYE:  SWELLING THROAT - DENIES SOB  . Sulfa Antibiotics Swelling    NOSE  . Tea Swelling    THROAT - DENIES SOB   Family History  Problem Relation Age of Onset  . Diabetes Mother   . Diabetes Father   . Cancer Maternal Uncle        pancreatic cancer   . Diabetes Paternal Grandmother   . Diabetes Paternal Grandfather      Past medical history, social, surgical and family history all reviewed in electronic medical record.  No pertanent information unless  stated regarding to the chief complaint.   Review of Systems: No headache, visual changes, nausea, vomiting, diarrhea, constipation, dizziness, abdominal pain, skin rash, fevers, chills, night sweats, weight loss, swollen lymph nodes, chest pain, shortness of breath, mood changes.  Positive muscle aches, body aches  Objective  Blood pressure 102/70, pulse 78, height 5' 3.5" (1.613 m), weight 103 lb (46.7 kg), SpO2 94 %. Systems examined below as of 12/27/16   General: No apparent distress alert and oriented x3 mood and affect normal, dressed appropriately. Underweight HEENT: Pupils equal, extraocular movements intact  Respiratory: Patient's speak in full sentences and does not  appear short of breath  Cardiovascular: No lower extremity edema, non tender, no erythema  Skin: Son damaged skin Abdomen: Soft nontender  Neuro: Cranial nerves II through XII are intact, neurovascularly intact in all extremities with 2+ DTRs and 2+ pulses.  Lymph: No lymphadenopathy of posterior or anterior cervical chain or axillae bilaterally.  Gait normal with good balance and coordination.  MSK:  Non tender with full range of motion and good stability and symmetric strength and tone of shoulders, elbows, wrist, hip, knee and ankles bilaterally.  Back Exam:  Inspection: Loss of lordosis degenerative changes noted Motion: Flexion 35 deg, Extension 30 deg, Side Bending to 40 deg bilaterally,  Rotation to 30 deg bilaterally  SLR laying: Negative  XSLR laying: Negative  Palpable tenderness: Moderate tenderness in the paraspinal musculature lumbar spine FABER: Mild tightness of the right. Sensory change: Gross sensation intact to all lumbar and sacral dermatomes.  Reflexes: 2+ at both patellar tendons, 2+ at achilles tendons, Babinski's downgoing.  Strength at foot  Plantar-flexion: 5/5 Dorsi-flexion: 5/5 Eversion: 5/5 Inversion: 5/5  Leg strength  Quad: 5/5 Hamstring: 5/5 Hip flexor: 5/5 Hip abductors: 4/5 symmetric Gait unremarkable.     Impression and Recommendations:     This case required medical decision making of moderate complexity.      Note: This dictation was prepared with Dragon dictation along with smaller phrase technology. Any transcriptional errors that result from this process are unintentional.

## 2016-12-27 NOTE — Patient Instructions (Signed)
Good to see you  We will get a nerve root injection and I hope it helps Check the providers at [facility].  Take a break from the exercises until 1 week after the injection  Stop the gabapentin and start 10 mg of nortriptyline at night See me again 3 weeks AFTER the injection

## 2016-12-29 DIAGNOSIS — M7989 Other specified soft tissue disorders: Secondary | ICD-10-CM | POA: Diagnosis not present

## 2017-01-02 ENCOUNTER — Telehealth: Payer: Self-pay | Admitting: Family Medicine

## 2017-01-02 DIAGNOSIS — M7989 Other specified soft tissue disorders: Secondary | ICD-10-CM | POA: Diagnosis not present

## 2017-01-02 NOTE — Telephone Encounter (Signed)
Advised pt that [facility] has the nerve root order. Provided pt with their phone number to call & get appt scheduled.

## 2017-01-02 NOTE — Telephone Encounter (Signed)
Pt called stating that she was suppose to have a referral sent over to [facility] but she has not heard anything from them and I did not see anything in the referral section of the chart. She also said that she has been taking an over the counter medication and wanted to clarify how long she would need to be off of it before the visit. Please advise.

## 2017-01-08 DIAGNOSIS — M7989 Other specified soft tissue disorders: Secondary | ICD-10-CM | POA: Diagnosis not present

## 2017-01-11 ENCOUNTER — Ambulatory Visit
Admission: RE | Admit: 2017-01-11 | Discharge: 2017-01-11 | Disposition: A | Discharge status: Home / Self Care | Payer: Medicare Other | Source: Ambulatory Visit | Attending: Family Medicine | Admitting: Family Medicine

## 2017-01-11 DIAGNOSIS — S3421XA Injury of nerve root of lumbar spine, initial encounter: Secondary | ICD-10-CM

## 2017-01-11 DIAGNOSIS — M545 Low back pain: Secondary | ICD-10-CM | POA: Diagnosis not present

## 2017-01-11 MED ORDER — IOPAMIDOL (ISOVUE-M 200) INJECTION 41%
1.0000 mL | Freq: Once | INTRAMUSCULAR | Status: AC
  Administered 2017-01-11: 1 mL via EPIDURAL

## 2017-01-11 MED ORDER — METHYLPREDNISOLONE ACETATE 40 MG/ML INJ SUSP (RADIOLOG
120.0000 mg | Freq: Once | INTRAMUSCULAR | Status: AC
  Administered 2017-01-11: 120 mg via EPIDURAL

## 2017-01-11 NOTE — Discharge Instructions (Signed)

## 2017-01-14 DIAGNOSIS — Z23 Encounter for immunization: Secondary | ICD-10-CM | POA: Diagnosis not present

## 2017-01-19 ENCOUNTER — Other Ambulatory Visit: Payer: Self-pay | Admitting: *Deleted

## 2017-01-19 MED ORDER — NORTRIPTYLINE HCL 10 MG PO CAPS: 10.0000 mg | ORAL_CAPSULE | Freq: Every day | ORAL | 1 refills | Status: DC

## 2017-01-19 NOTE — Telephone Encounter (Signed)
Refill done.  

## 2017-01-29 DIAGNOSIS — M7989 Other specified soft tissue disorders: Secondary | ICD-10-CM | POA: Diagnosis not present

## 2017-02-06 ENCOUNTER — Ambulatory Visit (INDEPENDENT_AMBULATORY_CARE_PROVIDER_SITE_OTHER): Payer: Medicare Other | Admitting: Family Medicine

## 2017-02-06 ENCOUNTER — Encounter: Payer: Self-pay | Admitting: Family Medicine

## 2017-02-06 VITALS — BP 132/76 | HR 102 | Ht 63.5 in | Wt 108.0 lb

## 2017-02-06 DIAGNOSIS — S3421XD Injury of nerve root of lumbar spine, subsequent encounter: Secondary | ICD-10-CM

## 2017-02-06 DIAGNOSIS — M5416 Radiculopathy, lumbar region: Secondary | ICD-10-CM

## 2017-02-06 MED ORDER — NORTRIPTYLINE HCL 10 MG PO CAPS: 20.0000 mg | ORAL_CAPSULE | Freq: Every day | ORAL | 2 refills | Status: DC

## 2017-02-06 NOTE — Patient Instructions (Signed)
Good to see you  Christina [name] is your friend.  Stay active and try to get moving Physical therapy will call you  Increase the nortriptyline to 20 mg nightly Tart cherry extract up to 1200mg  daily  See me again in 6-8 weeks.

## 2017-02-06 NOTE — Progress Notes (Signed)
[name] [place], [place] [address] Phone: [phone] Subjective:    I'm seeing this patient by the request  of:    CC:   [id number]  [name] C Gun[place] is a 66 y.o. female coming in for follow up for back pain. She continues to have intermittent pain. She said that walking sometimes makes things worse. She said that every day living and that there is not pattern to her pain.  Onset-  Location Duration-  Character- Aggravating factors- Reliving factors-  Therapies tried-  Severity-     Past Medical History:  Diagnosis Date  . Abrasion of right leg 12/17/2014  . Adrenal insufficiency ([place])   . Breast cancer of upper-outer quadrant of left female breast ([facility]) 12/07/2014  . Cataract, immature    bilateral  . Dental bridge present    upper - x 2  . Dental crowns present   . Hypothyroidism   . Prediabetes    Past Surgical History:  Procedure Laterality Date  . ABDOMINOPLASTY    . BALLOON SINUPLASTY    . BREAST LUMPECTOMY WITH RADIOACTIVE SEED AND SENTINEL LYMPH NODE BIOPSY Left 12/22/2014   Procedure: BREAST LUMPECTOMY WITH RADIOACTIVE SEED AND SENTINEL LYMPH NODE BIOPSY;  Surgeon: Stark Klein, MD;  Location: [facility];  Service: General;  Laterality: Left;  . BUNIONECTOMY Right   . HAMMER TOE SURGERY Bilateral   . RHINOPLASTY    . SEPTOPLASTY     Social History   Social History  . Marital status: Widowed    Spouse name: [name]  . Number of children: [name]  . Years of education: [name]   Occupational History  .  [name]   Social History Main Topics  . Smoking status: Former Smoker    Packs/day: 0.00    Years: 0.00    Quit date: 05/01/1980  . Smokeless tobacco: Never Used  . Alcohol use 0.0 oz/week     Comment: occasionally  . Drug use: No  . Sexual activity: Not on file   Other Topics Concern  . Not on file   Social History Narrative  . No narrative on file   Allergies  Allergen  Reactions  . Celery Oil Other (See Comments)    FEVER BLISTERS  . Doxycycline Nausea And Vomiting  . Rye Grass Flower Pollen Extract [Gramineae Pollens] Swelling    RYE:  SWELLING THROAT - DENIES SOB  . Tea Swelling    THROAT - DENIES SOB  . Adhesive [Tape] Other (See Comments)    TEARS SKIN  . Sulfa Antibiotics Swelling    NOSE   Family History  Problem Relation Age of Onset  . Diabetes Mother   . Diabetes Father   . Cancer Maternal Uncle        pancreatic cancer   . Diabetes Paternal Grandmother   . Diabetes Paternal Grandfather      Past medical history, social, surgical and family history all reviewed in electronic medical record.  No pertanent information unless stated regarding to the chief complaint.   Review of Systems:Review of systems updated and as accurate as of 02/06/17  No headache, visual changes, nausea, vomiting, diarrhea, constipation, dizziness, abdominal pain, skin rash, fevers, chills, night sweats, weight loss, swollen lymph nodes, body aches, joint swelling, muscle aches, chest pain, shortness of breath, mood changes.   Objective  There were no vitals taken for this visit. Systems examined below as of 02/06/17   General: No apparent distress  alert and oriented x3 mood and affect normal, dressed appropriately.  HEENT: Pupils equal, extraocular movements intact  Respiratory: Patient's speak in full sentences and does not appear short of breath  Cardiovascular: No lower extremity edema, non tender, no erythema  Skin: Warm dry intact with no signs of infection or rash on extremities or on axial skeleton.  Abdomen: Soft nontender  Neuro: Cranial nerves II through XII are intact, neurovascularly intact in all extremities with 2+ DTRs and 2+ pulses.  Lymph: No lymphadenopathy of posterior or anterior cervical chain or axillae bilaterally.  Gait normal with good balance and coordination.  MSK:  Non tender with full range of motion and good stability and  symmetric strength and tone of shoulders, elbows, wrist, hip, knee and ankles bilaterally.     Impression and Recommendations:     This case required medical decision making of moderate complexity.      Note: This dictation was prepared with Dragon dictation along with smaller phrase technology. Any transcriptional errors that result from this process are unintentional.

## 2017-02-06 NOTE — Progress Notes (Signed)
[name] [place], [place] [address] Phone: [phone] Subjective:    I'm seeing this patient by the request  of:  Lollie Sails, MD   CC: low back pain f/u  WGY:[id number]  Christina [name] is a 66 y.o. female coming in with complaint of low back pain. Severe degenerative changes of the lumbar spine. Patient has had difficulty with this for quite some time.   Patient was having worsening pain. MRI was done. Patient did have a L3 right nerve root impingement. Was given an injection. Had significant improvement for 1 week and if worsening symptoms patient states now unfortunately about the same pain as previously. Patient states that the radicular pain seems to be a little bit better. Patient was workout but feels like she does not know how to do this safely.      Previous mri 7/5 18 1. Unchanged right foraminal and extraforaminal disc extrusion at L3-4 with right L3 nerve root impingement. 2. Mild right and mild-to-moderate left foraminal stenosis at L4-5. Mildly regressed left foraminal/extraforaminal disc protrusion. 3. Unchanged lateral recess stenosis at L2-3. 4. Unchanged 4 mm right extraforaminal abnormality at L1-2, possiblya regressed disc protrusion or residua of a prior hemorrhage (lesslikely nerve sheath tumor given regression).    Past Medical History:  Diagnosis Date  . Abrasion of right leg 12/17/2014  . Adrenal insufficiency ([place])   . Breast cancer of upper-outer quadrant of left female breast ([place]) 12/07/2014  . Cataract, immature    bilateral  . Dental bridge present    upper - x 2  . Dental crowns present   . Hypothyroidism   . Prediabetes    Past Surgical History:  Procedure Laterality Date  . ABDOMINOPLASTY    . BALLOON SINUPLASTY    . BREAST LUMPECTOMY WITH RADIOACTIVE SEED AND SENTINEL LYMPH NODE BIOPSY Left 12/22/2014   Procedure: BREAST LUMPECTOMY WITH RADIOACTIVE SEED AND SENTINEL LYMPH NODE  BIOPSY;  Surgeon: Stark Klein, MD;  Location: [facility];  Service: General;  Laterality: Left;  . BUNIONECTOMY Right   . HAMMER TOE SURGERY Bilateral   . RHINOPLASTY    . SEPTOPLASTY     Social History   Social History  . Marital status: Widowed    Spouse name: [name]  . Number of children: [name]  . Years of education: [name]   Occupational History  .  [name]   Social History Main Topics  . Smoking status: Former Smoker    Packs/day: 0.00    Years: 0.00    Quit date: 05/01/1980  . Smokeless tobacco: Never Used  . Alcohol use 0.0 oz/week     Comment: occasionally  . Drug use: No  . Sexual activity: Not Asked   Other Topics Concern  . None   Social History Narrative  . None   Allergies  Allergen Reactions  . Celery Oil Other (See Comments)    FEVER BLISTERS  . Doxycycline Nausea And Vomiting  . Rye Grass Flower Pollen Extract [Gramineae Pollens] Swelling    RYE:  SWELLING THROAT - DENIES SOB  . Tea Swelling    THROAT - DENIES SOB  . Adhesive [Tape] Other (See Comments)    TEARS SKIN  . Sulfa Antibiotics Swelling    NOSE   Family History  Problem Relation Age of Onset  . Diabetes Mother   . Diabetes Father   . Cancer Maternal Uncle        pancreatic cancer   .  Diabetes Paternal Grandmother   . Diabetes Paternal Grandfather      Past medical history, social, surgical and family history all reviewed in electronic medical record.  No pertanent information unless stated regarding to the chief complaint.   Review of Systems: No headache, visual changes, nausea, vomiting, diarrhea, constipation, dizziness, abdominal pain, skin rash, fevers, chills, night sweats, weight loss, swollen lymph nodes, chest pain, shortness of breath, mood changes.  Positive muscle aches, body aches  Objective  Blood pressure 132/76, pulse (!) 102, height 5' 3.5" (1.613 m), weight 108 lb (49 kg), SpO2 98 %.   Systems examined below as of 02/06/17 General: NAD A&O x3  mood, affect normal  HEENT: Pupils equal, extraocular movements intact no nystagmus Respiratory: not short of breath at rest or with speaking Cardiovascular: No lower extremity edema, non tender Skin: Warm dry intact with no signs of infection or rash on extremities or on axial skeleton. Abdomen: Soft nontender, no masses Neuro: Cranial nerves  intact, neurovascularly intact in all extremities with 2+ DTRs and 2+ pulses. Lymph: No lymphadenopathy appreciated today  Gait normal with good balance and coordination.  MSK: Non tender with full range of motion and good stability and symmetric strength and tone of shoulders, elbows, wrist,  knee hips and ankles bilaterally.   Back Exam:  Inspection: Loss of lordosis degenerative changes noted Motion: Flexion 35 deg, Extension 30 deg, Side Bending to 40 deg bilaterally,  Rotation to 30 deg bilaterally  SLR laying: Negative Better XSLR laying: Negative  Palpable tenderness: Moderate tenderness in the paraspinal musculature lumbar spine Minimally improved from previous exam FABER: Mild tightness of the right. Sensory change: Gross sensation intact to all lumbar and sacral dermatomes.  Reflexes: 2+ at both patellar tendons, 2+ at achilles tendons, Babinski's downgoing.  Strength at foot  Plantar-flexion: 5/5 Dorsi-flexion: 5/5 Eversion: 5/5 Inversion: 5/5  Leg strength  Quad: 5/5 Hamstring: 5/5 Hip flexor: 5/5 Hip abductors: 4/5 but symmetric symmetric Gait unremarkable.     Impression and Recommendations:     This case required medical decision making of moderate complexity.      Note: This dictation was prepared with Dragon dictation along with smaller phrase technology. Any transcriptional errors that result from this process are unintentional.

## 2017-02-06 NOTE — Assessment & Plan Note (Signed)
Patient did respond somewhat to the injection. We'll start with formal physical therapy. Noticing some improvement overall. We discussed the possibility of a microdiscectomy that could be beneficial if needed.

## 2017-02-10 ENCOUNTER — Other Ambulatory Visit: Payer: Self-pay | Admitting: Family Medicine

## 2017-02-12 NOTE — Telephone Encounter (Signed)
Refill done.  

## 2017-02-21 ENCOUNTER — Encounter: Payer: Self-pay | Admitting: Physical Therapy

## 2017-02-21 ENCOUNTER — Ambulatory Visit: Payer: Medicare Other | Attending: Family Medicine | Admitting: Physical Therapy

## 2017-02-21 DIAGNOSIS — G8929 Other chronic pain: Secondary | ICD-10-CM | POA: Insufficient documentation

## 2017-02-21 DIAGNOSIS — M62838 Other muscle spasm: Secondary | ICD-10-CM | POA: Diagnosis not present

## 2017-02-21 DIAGNOSIS — M545 Low back pain: Secondary | ICD-10-CM | POA: Insufficient documentation

## 2017-02-21 DIAGNOSIS — M6281 Muscle weakness (generalized): Secondary | ICD-10-CM | POA: Insufficient documentation

## 2017-02-21 NOTE — Therapy (Signed)
[facility] [address] [place], [place], [address] Phone: [phone]   Fax:  [phone]  Physical Therapy Evaluation  Patient Details  Name: Christina [name] MRN: [id number] Date of Birth: 04-Feb-1951 Referring Provider: Hulan Saas, DO  Encounter Date: 02/21/2017      PT End of Session - 02/21/17 1622    Visit Number 1   Number of Visits 113   Date for PT Re-Evaluation 04/04/17   Authorization Type Medicare A and B    Authorization Time Period 02/21/17 to 03/05/17   Authorization - Visit Number 1   Authorization - Number of Visits 10   PT Start Time 1100   PT Stop Time 2536   PT Time Calculation (min) 42 min   Activity Tolerance Patient tolerated treatment well;No increased pain   Behavior During Therapy WFL for tasks assessed/performed      Past Medical History:  Diagnosis Date  . Abrasion of right leg 12/17/2014  . Adrenal insufficiency ([place])   . Breast cancer of upper-outer quadrant of left female breast ([place]) 12/07/2014  . Cataract, immature    bilateral  . Dental bridge present    upper - x 2  . Dental crowns present   . Hypothyroidism   . Prediabetes     Past Surgical History:  Procedure Laterality Date  . ABDOMINOPLASTY    . BALLOON SINUPLASTY    . BREAST LUMPECTOMY WITH RADIOACTIVE SEED AND SENTINEL LYMPH NODE BIOPSY Left 12/22/2014   Procedure: BREAST LUMPECTOMY WITH RADIOACTIVE SEED AND SENTINEL LYMPH NODE BIOPSY;  Surgeon: Stark Klein, MD;  Location: [facility];  Service: General;  Laterality: Left;  . BUNIONECTOMY Right   . HAMMER TOE SURGERY Bilateral   . RHINOPLASTY    . SEPTOPLASTY      There were no vitals filed for this visit.       Subjective Assessment - 02/21/17 1111    Subjective Pt reports that her pain started several months ago while completing yoga. She says her back and Rt hip grabbed suddenly. She feels that the pain has gotten worse overall. She  received PEMF therapy which seemed to help alot. She saw her physician where she received an injection at L3, and was given exercises that seemed to only make her symptoms worse.    Limitations House hold activities;Walking   How long can you walk comfortably? unable to power walk    Patient Stated Goals improve her ability to complete exercise and horse riding without significant pain (currently only riding 2x/week).    Currently in Pain? No/denies   Pain Location Buttocks   Pain Orientation Right   Pain Descriptors / Indicators Nagging   Pain Type Chronic pain   Pain Radiating Towards none    Pain Onset More than a month ago   Pain Frequency Intermittent   Aggravating Factors  piriformis stretch, prolonged horse riding and power walking    Pain Relieving Factors avoiding painful positions, injections helped for ~1 week    Effect of Pain on Daily Activities limited activity participation             [facility] PT Assessment - 02/21/17 0001      Assessment   Medical Diagnosis Lumbar radiculopathy    Referring Provider Hulan Saas, DO   Onset Date/Surgical Date --  ~3-6 months ago    Prior Therapy none      Balance Screen   Has the patient fallen in the past 6  months No   Has the patient had a decrease in activity level because of a fear of falling?  No   Is the patient reluctant to leave their home because of a fear of falling?  No     Home Ecologist residence     Prior Function   Level of Independence Independent     Observation/Other Assessments   Focus on Therapeutic Outcomes (FOTO)  44% limited      Sensation   Light Touch Appears Intact   Additional Comments Pt reports numbness along Rt side of hip/thigh that resolved since then      Functional Tests   Functional tests Squat;Single leg stance;Sit to Stand     Squat   Comments x10 reps, (+) valgus on the Rt     Single Leg Stance   Comments (+) trendelenburg on the Lt, Rt: 8 sec Lt  7 sec      Sit to Stand   Comments Pt using UE support on thighs throughout session      ROM / Strength   AROM / PROM / Strength AROM;Strength     AROM   AROM Assessment Site Lumbar   Lumbar Flexion WNL, pain free   Lumbar Extension WNL, pain end range    Lumbar - Right Side Bend WNL, pain free   Lumbar - Left Side Bend WNL, pain free     Strength   Overall Strength Comments trunk strength: 3+/5, leg lower to 65 deg only    Strength Assessment Site Hip;Knee;Ankle   Right/Left Hip Right;Left   Right Hip Flexion 3+/5  pain reported anterior hip    Right Hip Extension 4+/5   Right Hip ABduction 4-/5   Left Hip Flexion 3+/5   Left Hip Extension 4+/5   Left Hip ABduction 4+/5   Right/Left Knee Right;Left   Right Knee Extension 5/5   Left Knee Extension 5/5   Right/Left Ankle Right;Left   Right Ankle Dorsiflexion 5/5   Left Ankle Dorsiflexion 5/5     Flexibility   Soft Tissue Assessment /Muscle Length yes   Hamstrings WNL   Piriformis 25% limited      Palpation   Spinal mobility hypomole throughout Lumbar spine, non tender    Palpation comment Rt glute med and piriformis tender with palpation             Objective measurements completed on examination: See above findings.          [facility] Adult PT Treatment/Exercise - 02/21/17 0001      Exercises   Exercises Knee/Hip;Lumbar     Lumbar Exercises: Supine   Bridge 10 reps   Bridge Limitations HEP demo      Lumbar Exercises: Prone   Other Prone Lumbar Exercises prone press ups on hands x10 reps                 PT Education - 02/21/17 1620    Education provided Yes   Education Details eval findings/POC; initial HEP implemented and reviewed; benefits of increasing core stability and strength with daily activity and horse riding   Person(s) Educated Patient   Methods Explanation;Verbal cues;Handout   Comprehension Verbalized understanding;Returned demonstration          PT Short Term Goals -  02/21/17 1635      PT SHORT TERM GOAL #1   Title Pt will demo consistency and independence with her HEP to decrease pain and improve BLE strength.  Time 3   Period Weeks   Status New   Target Date 03/14/17     PT SHORT TERM GOAL #2   Title Pt will report atleast 25% improvement in Rt buttock pain to allow for return to daily brisk walking routine without significant increase in symptoms.    Time 3   Period Weeks   Status New           PT Long Term Goals - 02/21/17 1636      PT LONG TERM GOAL #1   Title Pt will demo improved BLE strength to 5/5 MMT which will increase her safety with daily activity.    Time 6   Period Weeks   Status New   Target Date 04/04/17     PT LONG TERM GOAL #2   Title Pt will report being able to return to horse riding atleast 3x/week without difficulty to improve her quality of life and participation in activity with her peers.    Time 6   Period Weeks   Status New     PT LONG TERM GOAL #3   Title Pt will report atleast 75% improvement in her symptoms to allow for her to resume all prior activities without difficulty.    Time 6   Period Weeks   Status New     PT LONG TERM GOAL #4   Title Pt will demo improved hip stability evident by her ability to maintain single leg balance without LOB or (+) trendelenburg deviation for atleast 15 sec, 2/3 trials.    Time 6   Period Weeks   Status New                Plan - 02/21/17 1624    Clinical Impression Statement Pt is a pleasant 66 y.o F referred to OPPT with complaints of Rt sided hip/buttock pain onset insidiously several months ago. She demonstrates limitations in hip flexion and abduction strength on the Rt, muscle tenderness along the piriformis and glute medius, as well as hypomobility throughout the lumbar spine. Pt did not present with a specific directional preference, and her symptoms were only recreated with direct palpation of the gluteal muscles and seated piriformis stretch at  this time. In addition she demonstrates limitations in trunk strength as well as poor stability of the proximal hip during single leg stance and BLE squatting. She is typically very active at the gym and rides horses 3 or more times a week and would benefit from skilled PT to facilitate full return to these activities without increase in pain or difficulty.   History and Personal Factors relevant to plan of care: history of low back pain responded well to injections   Clinical Presentation Evolving   Clinical Presentation due to: pt reports pain is worsening   Clinical Decision Making Low   Rehab Potential Good   PT Frequency 2x / week   PT Duration 6 weeks   PT Treatment/Interventions ADLs/Self Care Home Management;Electrical Stimulation;Moist Heat;Traction;Balance training;Therapeutic exercise;Therapeutic activities;Functional mobility training;Neuromuscular re-education;Patient/family education;Passive range of motion;Manual techniques;Dry needling;Taping   PT Next Visit Plan further assessment of SI joint; assess response to pressups; trunk stabilization; hip strengthening progressions; manual techniques and possible dry needling to address muscle spasm in gluteals and piriformis   PT Home Exercise Plan prone press ups, bridge    Consulted and Agree with Plan of Care Patient      Patient will benefit from skilled therapeutic intervention in order to improve the following deficits and  impairments:  Decreased balance, Impaired flexibility, Hypomobility, Decreased strength, Decreased endurance, Decreased activity tolerance, Decreased mobility, Increased muscle spasms, Postural dysfunction, Pain, Improper body mechanics  Visit Diagnosis: Chronic right-sided low back pain, with sciatica presence unspecified  Muscle weakness (generalized)  Other muscle spasm      G-Codes - 02-23-17 1635    Functional Assessment Tool Used (Outpatient Only) FOTO: 44% limited    Functional Limitation Mobility:  Walking and moving around   Mobility: Walking and Moving Around Current Status [phone]) At least 40 percent but less than 60 percent impaired, limited or restricted   Mobility: Walking and Moving Around Goal Status [phone]) At least 20 percent but less than 40 percent impaired, limited or restricted       Problem List Patient Active Problem List   Diagnosis Date Noted  . 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 Breast cancer of upper-outer quadrant of left female breast ([place]) 12/07/2014  . Acute recurrent sinusitis 01/17/2013   4:45 PM,02/23/17 Elly Modena PT, DPT [facility] [address], [place], [place], [address] Phone: [phone]   Fax:  [phone]  Name: Christina [name] MRN: [id number] Date of Birth: Oct 28, 1950

## 2017-02-21 NOTE — Patient Instructions (Signed)
  Prone Press ups ([employer] Extensions)  Lie flat on your stomach with your arms in push up position. Press up to the point of restriction and down again. Your therapist may want you to move your legs to the left or right before you press up. Repeat.  3x/day. Every time you have pain.    BRIDGING  While lying on your back with knees bent, tighten your lower abdominals, squeeze your buttocks and then raise your buttocks off the floor/bed as creating a "Bridge" with your body. Hold and then lower yourself and repeat. Lift your toes, keep knees apart. 2x15 reps.   [facility] [address] [place], [place] [address] Phone # [phone] Fax [phone]

## 2017-02-23 ENCOUNTER — Ambulatory Visit: Payer: Medicare Other | Admitting: Physical Therapy

## 2017-02-23 DIAGNOSIS — M6281 Muscle weakness (generalized): Secondary | ICD-10-CM

## 2017-02-23 DIAGNOSIS — G8929 Other chronic pain: Secondary | ICD-10-CM

## 2017-02-23 DIAGNOSIS — M62838 Other muscle spasm: Secondary | ICD-10-CM

## 2017-02-23 DIAGNOSIS — M545 Low back pain: Secondary | ICD-10-CM | POA: Diagnosis not present

## 2017-02-23 NOTE — Patient Instructions (Signed)
   Abdominal brace  Laying on your back with your knees bent, draw in your belly button as if putting on a tight pair of pants. It should feel like you are flattening or pushing your back into the table/bed.   Exhale when exhaling, x10 reps, 2-3x/day    Supine Marching  Lie down on your back with the feet on the floor and your knees bent at 90 degrees. The low back should remain neutral or flat against the table through the whole exercise. Brace the abdominals and slowly raise one leg towards the ceiling while maintaining the same 90 degree bend through the knee. Stop when the lower leg is parallel with the floor. Slowly lower the leg back down to the starting position. Repeat the motion with the other leg. Keep a steady breathing pattern through the whole exercise.      x10 reps each, 2-3x/day

## 2017-02-23 NOTE — Therapy (Signed)
[facility] [address], [place], [place], [address] Phone: [phone]   Fax:  [phone]  Physical Therapy Treatment  Patient Details  Name: Christina [name] MRN: [id number] Date of Birth: 07-Oct-1950 Referring Provider: Hulan Saas, DO  Encounter Date: 02/23/2017      PT End of Session - 02/23/17 1104    Visit Number 2   Number of Visits 13   Date for PT Re-Evaluation 04/04/17   Authorization Type Medicare A and B    Authorization Time Period 02/21/17 to 03/05/17   Authorization - Visit Number 2   Authorization - Number of Visits 10   PT Start Time 1018   PT Stop Time 1100   PT Time Calculation (min) 42 min   Activity Tolerance Patient tolerated treatment well;No increased pain   Behavior During Therapy WFL for tasks assessed/performed      Past Medical History:  Diagnosis Date  . Abrasion of right leg 12/17/2014  . Adrenal insufficiency ([place])   . Breast cancer of upper-outer quadrant of left female breast ([place]) 12/07/2014  . Cataract, immature    bilateral  . Dental bridge present    upper - x 2  . Dental crowns present   . Hypothyroidism   . Prediabetes     Past Surgical History:  Procedure Laterality Date  . ABDOMINOPLASTY    . BALLOON SINUPLASTY    . BREAST LUMPECTOMY WITH RADIOACTIVE SEED AND SENTINEL LYMPH NODE BIOPSY Left 12/22/2014   Procedure: BREAST LUMPECTOMY WITH RADIOACTIVE SEED AND SENTINEL LYMPH NODE BIOPSY;  Surgeon: Stark Klein, MD;  Location: [place];  Service: General;  Laterality: Left;  . BUNIONECTOMY Right   . HAMMER TOE SURGERY Bilateral   . RHINOPLASTY    . SEPTOPLASTY      There were no vitals filed for this visit.      Subjective Assessment - 02/23/17 1022    Subjective Pt reports that the front of her thighs were tight during her pressups. Her bridge is fine, but when she tried doing it when she was finished riding this caused some spasm in her  quads. No pain currently, just tightness in her hips.    Limitations House hold activities;Walking   How long can you walk comfortably? unable to power walk    Patient Stated Goals improve her ability to complete exercise and horse riding without significant pain (currently only riding 2x/week).    Currently in Pain? No/denies   Pain Onset More than a month ago                         [facility] Adult PT Treatment/Exercise - 02/23/17 0001      Lumbar Exercises: Supine   Ab Set 5 seconds;20 reps   AB Set Limitations focus   Bent Knee Raise 10 reps   Bent Knee Raise Limitations abdominal bracing      Lumbar Exercises: Prone   Other Prone Lumbar Exercises Prone pressups on hands x5 reps, adjusted to pressup on elbows x20 reps with therapist providing heavy verbal/tactile cuing for proper technique     Manual Therapy   Manual Therapy Soft tissue mobilization;Myofascial release   Soft tissue mobilization STM Rt gluteals   Myofascial Release TPR Rt piriformis and proximal glute max                 PT Education - 02/23/17 1103    Education provided Yes  Education Details reviewed technique with prone pressups and adjusted to press on elbows; POC moving forward in addressing impairments found during evaluation; technique with deep abdominal activation and HEP additions   Person(s) Educated Patient   Methods Explanation;Verbal cues;Tactile cues;Handout   Comprehension Returned demonstration;Verbalized understanding          PT Short Term Goals - 02/21/17 1635      PT SHORT TERM GOAL #1   Title Pt will demo consistency and independence with her HEP to decrease pain and improve BLE strength.    Time 3   Period Weeks   Status New   Target Date 03/14/17     PT SHORT TERM GOAL #2   Title Pt will report atleast 25% improvement in Rt buttock pain to allow for return to daily brisk walking routine without significant increase in symptoms.    Time 3   Period Weeks    Status New           PT Long Term Goals - 02/21/17 1636      PT LONG TERM GOAL #1   Title Pt will demo improved BLE strength to 5/5 MMT which will increase her safety with daily activity.    Time 6   Period Weeks   Status New   Target Date 04/04/17     PT LONG TERM GOAL #2   Title Pt will report being able to return to horse riding atleast 3x/week without difficulty to improve her quality of life and participation in activity with her peers.    Time 6   Period Weeks   Status New     PT LONG TERM GOAL #3   Title Pt will report atleast 75% improvement in her symptoms to allow for her to resume all prior activities without difficulty.    Time 6   Period Weeks   Status New     PT LONG TERM GOAL #4   Title Pt will demo improved hip stability evident by her ability to maintain single leg balance without LOB or (+) trendelenburg deviation for atleast 15 sec, 2/3 trials.    Time 6   Period Weeks   Status New               Plan - 02/23/17 1110    Clinical Impression Statement Pt arrived today requesting to review some of her exercises given at the initial evaluation. She was unable to complete the prone pressups without over activation of her hip/trunk extensors and required adjustments and heavy cuing to improve her technique with this. Therapist educated pt on the POC and proper activation of her deep abdominals with pt able to complete her exercises with proper technique by the end of the session. Ended with soft tissue mobilization to address Rt gluteal spasm and pt reported improved relaxation of her Rt hip and low back following today's session. Will continue with current POC.    Rehab Potential Good   PT Frequency 2x / week   PT Duration 6 weeks   PT Treatment/Interventions ADLs/Self Care Home Management;Electrical Stimulation;Moist Heat;Traction;Balance training;Therapeutic exercise;Therapeutic activities;Functional mobility training;Neuromuscular  re-education;Patient/family education;Passive range of motion;Manual techniques;Dry needling;Taping   PT Next Visit Plan assess response to pressups; trunk stabilization; hip strengthening progressions; manual techniques and possible dry needling to address muscle spasm in gluteals and piriformis   PT Home Exercise Plan prone press ups, bridge, abdominal bracing, ab bracing with marching    Consulted and Agree with Plan of Care Patient  Patient will benefit from skilled therapeutic intervention in order to improve the following deficits and impairments:  Decreased balance, Impaired flexibility, Hypomobility, Decreased strength, Decreased endurance, Decreased activity tolerance, Decreased mobility, Increased muscle spasms, Postural dysfunction, Pain, Improper body mechanics  Visit Diagnosis: Chronic right-sided low back pain, with sciatica presence unspecified  Muscle weakness (generalized)  Other muscle spasm     Problem List Patient Active Problem List   Diagnosis Date Noted  . 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 Breast cancer of upper-outer quadrant of left female breast ([place]) 12/07/2014  . Acute recurrent sinusitis 01/17/2013     11:22 AM,02/23/17 Elly Modena PT, DPT [place] [facility] [address] [place], [place], [address] Phone: [phone]   Fax:  [phone]  Name: Christina [name] MRN: [id number] Date of Birth: 28-Jul-1950

## 2017-02-28 ENCOUNTER — Ambulatory Visit: Payer: Medicare Other | Admitting: Physical Therapy

## 2017-02-28 DIAGNOSIS — M545 Low back pain: Secondary | ICD-10-CM | POA: Diagnosis not present

## 2017-02-28 DIAGNOSIS — G8929 Other chronic pain: Secondary | ICD-10-CM | POA: Diagnosis not present

## 2017-02-28 DIAGNOSIS — M62838 Other muscle spasm: Secondary | ICD-10-CM | POA: Diagnosis not present

## 2017-02-28 DIAGNOSIS — M6281 Muscle weakness (generalized): Secondary | ICD-10-CM | POA: Diagnosis not present

## 2017-02-28 NOTE — Patient Instructions (Signed)
  Supine Marching  Lie down on your back with the feet on the floor and your knees bent at 90 degrees. The low back should remain neutral or flat against the table through the whole exercise. Brace the abdominals and slowly raise one leg towards the ceiling while maintaining the same 90 degree bend through the knee. Stop when the lower leg is parallel with the floor. Slowly lower the leg back down to the starting position. Repeat the motion with the other leg. Keep a steady breathing pattern through the whole exercise.   2 sets of 10 reps, 2x/day      KNEE FALL OUT   While lying on your back with both knees bent, stabilize your spine by bracing your abdominal muscles. Hold this contraction as you slowly lower one knee to the side. Your pelvis should not move.   You can place your thumbs on your pelvic bone to get feedback of any movements that occur. If your pelvis moves too much, then next time lower the leg less to maintain good control.       x15 reps each side, 2x/day    [facility] [address], [place], [place] [address] Phone # [phone] Fax [phone]

## 2017-02-28 NOTE — Therapy (Signed)
[facility] [address], [place], [place], [address] Phone: [phone]   Fax:  [phone]  Physical Therapy Treatment  Patient Details  Name: Christina [name] MRN: [id number] Date of Birth: [birth year] Referring Provider: Hulan Saas, DO  Encounter Date: 02/28/2017      PT End of Session - 02/28/17 0833    Visit Number 3   Number of Visits 13   Date for PT Re-Evaluation 04/04/17   Authorization Type Medicare A and B    Authorization Time Period 02/21/17 to 03/05/17   Authorization - Visit Number 3   Authorization - Number of Visits 10   PT Start Time 0831   PT Stop Time 0917   PT Time Calculation (min) 46 min   Activity Tolerance Patient tolerated treatment well;No increased pain   Behavior During Therapy WFL for tasks assessed/performed      Past Medical History:  Diagnosis Date  . Abrasion of right leg 12/17/2014  . Adrenal insufficiency ([place])   . Breast cancer of upper-outer quadrant of left female breast ([place]) 12/07/2014  . Cataract, immature    bilateral  . Dental bridge present    upper - x 2  . Dental crowns present   . Hypothyroidism   . Prediabetes     Past Surgical History:  Procedure Laterality Date  . ABDOMINOPLASTY    . BALLOON SINUPLASTY    . BREAST LUMPECTOMY WITH RADIOACTIVE SEED AND SENTINEL LYMPH NODE BIOPSY Left 12/22/2014   Procedure: BREAST LUMPECTOMY WITH RADIOACTIVE SEED AND SENTINEL LYMPH NODE BIOPSY;  Surgeon: Stark Klein, MD;  Location: [place];  Service: General;  Laterality: Left;  . BUNIONECTOMY Right   . HAMMER TOE SURGERY Bilateral   . RHINOPLASTY    . SEPTOPLASTY      There were no vitals filed for this visit.      Subjective Assessment - 02/28/17 0832    Subjective Pt reports that she completed her HEP however she was having issues with relaxing and found that her exercises would increased muscle spasm in the back. She is still working on  breathing and relaxing.  Her spasm would bother her for maybe an hour following her exercises.    Limitations House hold activities;Walking   How long can you walk comfortably? unable to power walk    Patient Stated Goals improve her ability to complete exercise and horse riding without significant pain (currently only riding 2x/week).    Currently in Pain? No/denies   Pain Onset More than a month ago                [facility] Adult PT Treatment/Exercise - 02/28/17 0001      Lumbar Exercises: Supine   Ab Set 10 reps;5 seconds   Bent Knee Raise 10 reps   Bent Knee Raise Limitations abdominal bracing, x2 sets    Other Supine Lumbar Exercises abdominal bracing with bent knee fallout x10 reps each    Other Supine Lumbar Exercises low trunk rotation with LE on red physioball, therapist assistance x10 reps Lt/Rt      Lumbar Exercises: Sidelying   Hip Abduction 10 reps   Hip Abduction Limitations each LE                PT Education - 02/28/17 0917    Education provided Yes   Education Details discussed ways to decrease feedback gradually for improved independence with deep abdominal activation; dry needling information    Person(s)  Educated Patient   Methods Explanation   Comprehension Verbalized understanding          PT Short Term Goals - 02/28/17 0938      PT SHORT TERM GOAL #1   Title Pt will demo consistency and independence with her HEP to decrease pain and improve BLE strength.    Time 3   Period Weeks   Status Achieved     PT SHORT TERM GOAL #2   Title Pt will report atleast 25% improvement in Rt buttock pain to allow for return to daily brisk walking routine without significant increase in symptoms.    Time 3   Period Weeks   Status On-going           PT Long Term Goals - 02/28/17 0924      PT LONG TERM GOAL #1   Title Pt will demo improved BLE strength to 5/5 MMT which will increase her safety with daily activity.    Time 6   Period Weeks    Status On-going     PT LONG TERM GOAL #2   Title Pt will report being able to return to horse riding atleast 3x/week without difficulty to improve her quality of life and participation in activity with her peers.    Time 6   Period Weeks   Status On-going     PT LONG TERM GOAL #3   Title Pt will report atleast 75% improvement in her symptoms to allow for her to resume all prior activities without difficulty.    Time 6   Period Weeks   Status On-going     PT LONG TERM GOAL #4   Title Pt will demo improved hip stability evident by her ability to maintain single leg balance without LOB or (+) trendelenburg deviation for atleast 15 sec, 2/3 trials.    Time 6   Period Weeks   Status On-going               Plan - 02/28/17 1829    Clinical Impression Statement Pt is making steady progress towards her goals, reporting a decrease overall in her low back pain with her improved awareness of abdominal bracing throughout the day. Therapist was able to progress her therex without increase in low back pain however she still requires intermittent cues for breathing and LE coordination. Pt was initially willing to attempt dry needling treatment to address deep lumbar muscle spasm, however this was discontinued due to her request prior to the start of treatment. Therapist completed STM techniques instead, and pt reported no increase in her pain at this time.   Rehab Potential Good   PT Frequency 2x / week   PT Duration 6 weeks   PT Treatment/Interventions ADLs/Self Care Home Management;Electrical Stimulation;Moist Heat;Traction;Balance training;Therapeutic exercise;Therapeutic activities;Functional mobility training;Neuromuscular re-education;Patient/family education;Passive range of motion;Manual techniques;Dry needling;Taping   PT Next Visit Plan progress trunk stabilization; hip strengthening progressions; manual techniques and possible estim end of session to address pain/spasm   PT Home  Exercise Plan abdominal bracing, ab bracing with marching, ab bracing with bent knee fall out    Consulted and Agree with Plan of Care Patient      Patient will benefit from skilled therapeutic intervention in order to improve the following deficits and impairments:  Decreased balance, Impaired flexibility, Hypomobility, Decreased strength, Decreased endurance, Decreased activity tolerance, Decreased mobility, Increased muscle spasms, Postural dysfunction, Pain, Improper body mechanics  Visit Diagnosis: Chronic right-sided low back pain, with sciatica presence unspecified  Muscle weakness (generalized)  Other muscle spasm     Problem List Patient Active Problem List   Diagnosis Date Noted  . 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 Breast cancer of upper-outer quadrant of left female breast ([place]) 12/07/2014  . Acute recurrent sinusitis 01/17/2013    9:25 AM,02/28/17 Elly Modena PT, DPT [place] [facility] [address] [place], [place], [address] Phone: [phone]   Fax:  [phone]  Name: BRYNNLEIGH MCELWEE MRN: [id number] Date of Birth: 1951-03-04

## 2017-03-02 ENCOUNTER — Encounter: Payer: Self-pay | Admitting: Physical Therapy

## 2017-03-02 ENCOUNTER — Ambulatory Visit: Payer: Medicare Other | Attending: Family Medicine | Admitting: Physical Therapy

## 2017-03-02 DIAGNOSIS — G8929 Other chronic pain: Secondary | ICD-10-CM

## 2017-03-02 DIAGNOSIS — M62838 Other muscle spasm: Secondary | ICD-10-CM | POA: Diagnosis not present

## 2017-03-02 DIAGNOSIS — M545 Low back pain: Secondary | ICD-10-CM | POA: Diagnosis not present

## 2017-03-02 DIAGNOSIS — M6281 Muscle weakness (generalized): Secondary | ICD-10-CM | POA: Diagnosis not present

## 2017-03-02 NOTE — Therapy (Signed)
[facility] [address] [place], [place], [address] Phone: [phone]   Fax:  [phone]  Physical Therapy Treatment  Patient Details  Name: Christina [name] MRN: [id number] Date of Birth: Jul 30, 1950 Referring Provider: Hulan Saas, DO  Encounter Date: 03/02/2017      PT End of Session - 03/02/17 1008    Visit Number 4   Number of Visits 13   Date for PT Re-Evaluation 04/04/17   Authorization Type Medicare A and B    Authorization Time Period 02/21/17 to 03/05/17   Authorization - Visit Number 4   Authorization - Number of Visits 10   PT Start Time 5397   PT Stop Time 1055   PT Time Calculation (min) 60 min   Activity Tolerance Patient tolerated treatment well;No increased pain   Behavior During Therapy WFL for tasks assessed/performed      Past Medical History:  Diagnosis Date  . Abrasion of right leg 12/17/2014  . Adrenal insufficiency ([place])   . Breast cancer of upper-outer quadrant of left female breast ([place]) 12/07/2014  . Cataract, immature    bilateral  . Dental bridge present    upper - x 2  . Dental crowns present   . Hypothyroidism   . Prediabetes     Past Surgical History:  Procedure Laterality Date  . ABDOMINOPLASTY    . BALLOON SINUPLASTY    . BREAST LUMPECTOMY WITH RADIOACTIVE SEED AND SENTINEL LYMPH NODE BIOPSY Left 12/22/2014   Procedure: BREAST LUMPECTOMY WITH RADIOACTIVE SEED AND SENTINEL LYMPH NODE BIOPSY;  Surgeon: Stark Klein, MD;  Location: [facility];  Service: General;  Laterality: Left;  . BUNIONECTOMY Right   . HAMMER TOE SURGERY Bilateral   . RHINOPLASTY    . SEPTOPLASTY      There were no vitals filed for this visit.      Subjective Assessment - 03/02/17 1010    Subjective Reports increased pain and soreness after last session. Feels she is doing too much and the nerve is being aggrevated.    Currently in Pain? No/denies  Not currently laying down    Pain Location Hip   Pain Orientation Right   Pain Descriptors / Indicators Dull   Aggravating Factors  walking uphills   Pain Relieving Factors PEMF, injections   Multiple Pain Sites No                         [facility] Adult PT Treatment/Exercise - 03/02/17 0001      Lumbar Exercises: Aerobic   Stationary Bike Nustep L0 x 2 min  Heat behind her     Lumbar Exercises: Supine   Ab Set --  5x VC for no > 25% effort   Clam 5 reps  Bil, VC for 25% effort   Bent Knee Raise 5 reps  Bil     Electrical Stimulation   Electrical Stimulation Location RT gluteals in LT sidelying with pillows   Electrical Stimulation Action IFC, keep low level   Electrical Stimulation Parameters 80-150HZ    Electrical Stimulation Goals Pain                  PT Short Term Goals - 02/28/17 0923      PT SHORT TERM GOAL #1   Title Pt will demo consistency and independence with her HEP to decrease pain and improve BLE strength.    Time 3   Period Weeks   Status  Achieved     PT SHORT TERM GOAL #2   Title Pt will report atleast 25% improvement in Rt buttock pain to allow for return to daily brisk walking routine without significant increase in symptoms.    Time 3   Period Weeks   Status On-going           PT Long Term Goals - 02/28/17 0924      PT LONG TERM GOAL #1   Title Pt will demo improved BLE strength to 5/5 MMT which will increase her safety with daily activity.    Time 6   Period Weeks   Status On-going     PT LONG TERM GOAL #2   Title Pt will report being able to return to horse riding atleast 3x/week without difficulty to improve her quality of life and participation in activity with her peers.    Time 6   Period Weeks   Status On-going     PT LONG TERM GOAL #3   Title Pt will report atleast 75% improvement in her symptoms to allow for her to resume all prior activities without difficulty.    Time 6   Period Weeks   Status On-going     PT LONG TERM  GOAL #4   Title Pt will demo improved hip stability evident by her ability to maintain single leg balance without LOB or (+) trendelenburg deviation for atleast 15 sec, 2/3 trials.    Time 6   Period Weeks   Status On-going               Plan - 03/02/17 1009    Clinical Impression Statement Pt complaint today is that she feels she is doing too much where it aggrevates the nerve. All exericses were cut in half in and pt was encouraged to contract her core with no > than a 25% effort.  Plan discussed is to try this and see how that changes her pain after exercising. Estim was very low level, no greater than 5 in intensity.    Rehab Potential Good   PT Frequency 2x / week   PT Duration 6 weeks   PT Treatment/Interventions ADLs/Self Care Home Management;Electrical Stimulation;Moist Heat;Traction;Balance training;Therapeutic exercise;Therapeutic activities;Functional mobility training;Neuromuscular re-education;Patient/family education;Passive range of motion;Manual techniques;Dry needling;Taping   PT Next Visit Plan See how pt did reducing load by half and trial of Estim.    Consulted and Agree with Plan of Care Patient      Patient will benefit from skilled therapeutic intervention in order to improve the following deficits and impairments:  Decreased balance, Impaired flexibility, Hypomobility, Decreased strength, Decreased endurance, Decreased activity tolerance, Decreased mobility, Increased muscle spasms, Postural dysfunction, Pain, Improper body mechanics  Visit Diagnosis: Chronic right-sided low back pain, with sciatica presence unspecified  Muscle weakness (generalized)  Other muscle spasm     Problem List Patient Active Problem List   Diagnosis Date Noted  . 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 Breast cancer of upper-outer quadrant of left female breast ([place]) 12/07/2014  . Acute recurrent sinusitis 01/17/2013    Daiel Strohecker, PTA 03/02/2017,  10:40 AM  [facility] [address] [place], [place], [address] Phone: [phone]   Fax:  [phone]  Name: ALKA FALWELL MRN: [id number] Date of Birth: 1951/04/04

## 2017-03-07 ENCOUNTER — Ambulatory Visit: Payer: Medicare Other | Admitting: Physical Therapy

## 2017-03-07 DIAGNOSIS — M62838 Other muscle spasm: Secondary | ICD-10-CM | POA: Diagnosis not present

## 2017-03-07 DIAGNOSIS — M6281 Muscle weakness (generalized): Secondary | ICD-10-CM | POA: Diagnosis not present

## 2017-03-07 DIAGNOSIS — M545 Low back pain: Secondary | ICD-10-CM | POA: Diagnosis not present

## 2017-03-07 DIAGNOSIS — G8929 Other chronic pain: Secondary | ICD-10-CM

## 2017-03-07 NOTE — Therapy (Signed)
[facility] [address], [place], [place], [address] Phone: [phone]   Fax:  [phone]  Physical Therapy Treatment  Patient Details  Name: Christina [name] MRN: [id number] Date of Birth: 03-06-1951 Referring Provider: Hulan Saas, DO   Encounter Date: 03/07/2017  PT End of Session - 03/07/17 2118    Visit Number  5    Number of Visits  13    Date for PT Re-Evaluation  04/04/17    Authorization Type  Medicare A and B     Authorization Time Period  02/21/17 to 03/05/17    Authorization - Visit Number  5    Authorization - Number of Visits  10    PT Start Time  1540    PT Stop Time  1612    PT Time Calculation (min)  39 min    Activity Tolerance  Patient tolerated treatment well;No increased pain    Behavior During Therapy  WFL for tasks assessed/performed       Past Medical History:  Diagnosis Date  . Abrasion of right leg 12/17/2014  . Adrenal insufficiency ([place])   . Breast cancer of upper-outer quadrant of left female breast ([place]) 12/07/2014  . Cataract, immature    bilateral  . Dental bridge present    upper - x 2  . Dental crowns present   . Hypothyroidism   . Prediabetes     Past Surgical History:  Procedure Laterality Date  . ABDOMINOPLASTY    . BALLOON SINUPLASTY    . BUNIONECTOMY Right   . HAMMER TOE SURGERY Bilateral   . RHINOPLASTY    . SEPTOPLASTY      There were no vitals filed for this visit.  Subjective Assessment - 03/07/17 1534    Subjective  Pt reports that she went for a walk today and noted some increased pain in her Rt low back and buttock region near the end of her walk. She has been working on her exercises at home.     Currently in Pain?  Yes    Pain Score  3     Pain Location  Hip    Pain Orientation  Right    Pain Descriptors / Indicators  Aching    Pain Type  Chronic pain    Pain Radiating Towards  none     Pain Onset  More than a month ago    Pain Frequency   Intermittent    Aggravating Factors   walking up hills, walking for long distances     Pain Relieving Factors  PEMF, injections                       [facility] Adult PT Treatment/Exercise - 03/07/17 0001      Lumbar Exercises: Stretches   Single Knee to Chest Stretch  5 reps;10 seconds    Piriformis Stretch  3 reps;20 seconds supine figure 4   supine figure 4     Lumbar Exercises: Supine   Ab Set  10 reps;3 seconds    Other Supine Lumbar Exercises  deep breathing with hands on stomach/chest to ensure proper technique 2x10 reps       Knee/Hip Exercises: Stretches   Hip Flexor Stretch  Both;2 reps;30 seconds supine    supine      Knee/Hip Exercises: Sidelying   Clams  Rt side 2x7-8 reps pain free range       Manual Therapy   Manual Therapy  Joint mobilization    Joint Mobilization  Rt L4/L5 SNAG during standing extension 3x10 reps (pain free)              PT Education - 03/07/17 1600    Education provided  Yes    Education Details  decreasing walking and horseback riding to avoid irritation afterwards; addition to HEP    Person(s) Educated  Patient    Methods  Explanation;Verbal cues;Handout    Comprehension  Verbalized understanding;Returned demonstration       PT Short Term Goals - 02/28/17 2355      PT SHORT TERM GOAL #1   Title  Pt will demo consistency and independence with her HEP to decrease pain and improve BLE strength.     Time  3    Period  Weeks    Status  Achieved      PT SHORT TERM GOAL #2   Title  Pt will report atleast 25% improvement in Rt buttock pain to allow for return to daily brisk walking routine without significant increase in symptoms.     Time  3    Period  Weeks    Status  On-going        PT Long Term Goals - 02/28/17 0924      PT LONG TERM GOAL #1   Title  Pt will demo improved BLE strength to 5/5 MMT which will increase her safety with daily activity.     Time  6    Period  Weeks    Status  On-going      PT  LONG TERM GOAL #2   Title  Pt will report being able to return to horse riding atleast 3x/week without difficulty to improve her quality of life and participation in activity with her peers.     Time  6    Period  Weeks    Status  On-going      PT LONG TERM GOAL #3   Title  Pt will report atleast 75% improvement in her symptoms to allow for her to resume all prior activities without difficulty.     Time  6    Period  Weeks    Status  On-going      PT LONG TERM GOAL #4   Title  Pt will demo improved hip stability evident by her ability to maintain single leg balance without LOB or (+) trendelenburg deviation for atleast 15 sec, 2/3 trials.     Time  6    Period  Weeks    Status  On-going            Plan - 03/07/17 2119    Clinical Impression Statement  Session continued with gentle deep abdominal activation and hip flexibility exercises. Pt reported Rt low back pain with extension at the beginning of the session, however this had reportedly resolved following manual treatment and other exercises performed during the session. Pt continues to perform horseback riding and go on 2-3 mile walks during the day which could be contributing to her high levels of pain irritation at times, so therapist encouraged her to cut back temporarily until her symptoms are more consistently responding to her treatment in rehab. Pt verbalized understanding of this.     Rehab Potential  Good    PT Frequency  2x / week    PT Duration  6 weeks    PT Treatment/Interventions  ADLs/Self Care Home Management;Electrical Stimulation;Moist Heat;Traction;Balance training;Therapeutic exercise;Therapeutic activities;Functional mobility training;Neuromuscular re-education;Patient/family education;Passive range  of motion;Manual techniques;Dry needling;Taping    PT Next Visit Plan  Follow up on pt response to manual treatment; gentle hip strenghtening and trunk stability     PT Home Exercise Plan  abdominal bracing, ab  bracing with marching, ab bracing with bent knee fall out     Consulted and Agree with Plan of Care  Patient       Patient will benefit from skilled therapeutic intervention in order to improve the following deficits and impairments:  Decreased balance, Impaired flexibility, Hypomobility, Decreased strength, Decreased endurance, Decreased activity tolerance, Decreased mobility, Increased muscle spasms, Postural dysfunction, Pain, Improper body mechanics  Visit Diagnosis: Chronic right-sided low back pain, with sciatica presence unspecified  Muscle weakness (generalized)  Other muscle spasm     Problem List Patient Active Problem List   Diagnosis Date Noted  . 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 Breast cancer of upper-outer quadrant of left female breast ([place]) 12/07/2014  . Acute recurrent sinusitis 01/17/2013    9:28 PM,03/07/17 Elly Modena PT, DPT [place] [facility] [address] [place], [place], [address] Phone: [phone]   Fax:  [phone]  Name: Christina [name] MRN: [id number] Date of Birth: 1950-10-25

## 2017-03-09 ENCOUNTER — Ambulatory Visit: Payer: Medicare Other | Admitting: Physical Therapy

## 2017-03-09 DIAGNOSIS — G8929 Other chronic pain: Secondary | ICD-10-CM

## 2017-03-09 DIAGNOSIS — M62838 Other muscle spasm: Secondary | ICD-10-CM | POA: Diagnosis not present

## 2017-03-09 DIAGNOSIS — M6281 Muscle weakness (generalized): Secondary | ICD-10-CM | POA: Diagnosis not present

## 2017-03-09 DIAGNOSIS — M545 Low back pain: Secondary | ICD-10-CM | POA: Diagnosis not present

## 2017-03-09 NOTE — Therapy (Signed)
[facility] [address], [place], [place], [address] Phone: [phone]   Fax:  [phone]  Physical Therapy Treatment  Patient Details  Name: Christina [name] MRN: [id number] Date of Birth: 1950/09/10 Referring Provider: Hulan Saas, DO   Encounter Date: 03/09/2017  PT End of Session - 03/09/17 1057    Visit Number  6    Number of Visits  13    Date for PT Re-Evaluation  04/04/17    Authorization Type  Medicare A and B     Authorization Time Period  02/21/17 to 03/05/17    Authorization - Visit Number  6    Authorization - Number of Visits  10    PT Start Time  1006    PT Stop Time  1056    PT Time Calculation (min)  50 min    Activity Tolerance  Patient tolerated treatment well;No increased pain    Behavior During Therapy  WFL for tasks assessed/performed       Past Medical History:  Diagnosis Date  . Abrasion of right leg 12/17/2014  . Adrenal insufficiency ([place])   . Breast cancer of upper-outer quadrant of left female breast ([place]) 12/07/2014  . Cataract, immature    bilateral  . Dental bridge present    upper - x 2  . Dental crowns present   . Hypothyroidism   . Prediabetes     Past Surgical History:  Procedure Laterality Date  . ABDOMINOPLASTY    . BALLOON SINUPLASTY    . BUNIONECTOMY Right   . HAMMER TOE SURGERY Bilateral   . RHINOPLASTY    . SEPTOPLASTY      There were no vitals filed for this visit.  Subjective Assessment - 03/09/17 1008    Subjective  Pt reports that things were good following her last session, up until she rode her horse. She was a little sore following that    Pain Onset  More than a month ago             [facility] Adult PT Treatment/Exercise - 03/09/17 0001      Lumbar Exercises: Seated   Other Seated Lumbar Exercises  BUE press into bolster with exhale x15 reps       Lumbar Exercises: Prone   Straight Leg Raise  5 reps;3 seconds;Other (comment) x2 sets       Knee/Hip Exercises: Stretches   Hip Flexor Stretch  Right;2 reps;30 seconds;Other (comment) therapist instruction on technique       Knee/Hip Exercises: Prone   Hamstring Curl  2 sets;10 reps;Other (comment) double yellow TB       Manual Therapy   Manual Therapy  Other (comment)    Soft tissue mobilization  STM Rt gluteals    Myofascial Release  TPR Lt and Rt piriformis with contract/relax/stretch     Other Manual Therapy  manual thoracic traction 5x10 sec hold              PT Education - 03/09/17 1029    Education provided  Yes    Education Details  discussed the process of improving strength/flexibility over time and reminded pt that is is not an overnight process; answered pt questions regarding safe exercises for her UEs and LEs to complete at home or in the gym; saddle adjustments     Person(s) Educated  Patient    Methods  Explanation    Comprehension  Verbalized understanding       PT Short  Term Goals - 02/28/17 0923      PT SHORT TERM GOAL #1   Title  Pt will demo consistency and independence with her HEP to decrease pain and improve BLE strength.     Time  3    Period  Weeks    Status  Achieved      PT SHORT TERM GOAL #2   Title  Pt will report atleast 25% improvement in Rt buttock pain to allow for return to daily brisk walking routine without significant increase in symptoms.     Time  3    Period  Weeks    Status  On-going        PT Long Term Goals - 02/28/17 0924      PT LONG TERM GOAL #1   Title  Pt will demo improved BLE strength to 5/5 MMT which will increase her safety with daily activity.     Time  6    Period  Weeks    Status  On-going      PT LONG TERM GOAL #2   Title  Pt will report being able to return to horse riding atleast 3x/week without difficulty to improve her quality of life and participation in activity with her peers.     Time  6    Period  Weeks    Status  On-going      PT LONG TERM GOAL #3   Title  Pt will report  atleast 75% improvement in her symptoms to allow for her to resume all prior activities without difficulty.     Time  6    Period  Weeks    Status  On-going      PT LONG TERM GOAL #4   Title  Pt will demo improved hip stability evident by her ability to maintain single leg balance without LOB or (+) trendelenburg deviation for atleast 15 sec, 2/3 trials.     Time  6    Period  Weeks    Status  On-going            Plan - 03/09/17 1057    Clinical Impression Statement  Pt reported feeling good following her last session, however she was having difficulty with her stretch. Therapist reviewed proper technique and made adjustments as well as answer pt questions regarding exercises to complete at home that will not aggravate her back. Session focused on gentle hip strengthening therex and manual techniques to decrease Rt buttock pain. Pt was able to properly activate deep abdominal in a sitting position with decrease upper trap/neck straining compared to previous sessions. Although pt reported buttock soreness upon arrival, none of today's activity aggravated her symptoms and she reported no buttock pain at the end of the session. Will continue with current POC.     Rehab Potential  Good    PT Frequency  2x / week    PT Duration  6 weeks    PT Treatment/Interventions  ADLs/Self Care Home Management;Electrical Stimulation;Moist Heat;Traction;Balance training;Therapeutic exercise;Therapeutic activities;Functional mobility training;Neuromuscular re-education;Patient/family education;Passive range of motion;Manual techniques;Dry needling;Taping    PT Next Visit Plan  Follow up on pt response to saddle adjustments; gentle hip strenghtening and trunk stability     PT Home Exercise Plan  abdominal bracing, ab bracing with marching, ab bracing with bent knee fall out     Consulted and Agree with Plan of Care  Patient       Patient will benefit from skilled therapeutic intervention in order  to improve  the following deficits and impairments:  Decreased balance, Impaired flexibility, Hypomobility, Decreased strength, Decreased endurance, Decreased activity tolerance, Decreased mobility, Increased muscle spasms, Postural dysfunction, Pain, Improper body mechanics  Visit Diagnosis: Chronic right-sided low back pain, with sciatica presence unspecified  Muscle weakness (generalized)  Other muscle spasm     Problem List Patient Active Problem List   Diagnosis Date Noted  . 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 Breast cancer of upper-outer quadrant of left female breast ([place]) 12/07/2014  . Acute recurrent sinusitis 01/17/2013    11:11 AM,03/09/17 Elly Modena PT, DPT [place] [facility] [address] [place], [place], [address] Phone: [phone]   Fax:  [phone]  Name: Christina [name] MRN: [id number] Date of Birth: 1950/10/07

## 2017-03-12 ENCOUNTER — Other Ambulatory Visit: Payer: Self-pay | Admitting: *Deleted

## 2017-03-12 ENCOUNTER — Ambulatory Visit: Payer: Medicare Other | Admitting: Physical Therapy

## 2017-03-12 MED ORDER — NORTRIPTYLINE HCL 10 MG PO CAPS: 20.0000 mg | ORAL_CAPSULE | Freq: Every day | ORAL | 1 refills | Status: DC

## 2017-03-12 NOTE — Telephone Encounter (Signed)
90-day request for Nortriptyline 10mg .  Refill done

## 2017-03-14 ENCOUNTER — Encounter: Payer: Medicare Other | Admitting: Physical Therapy

## 2017-03-16 ENCOUNTER — Ambulatory Visit: Payer: Medicare Other | Admitting: Physical Therapy

## 2017-03-16 DIAGNOSIS — G8929 Other chronic pain: Secondary | ICD-10-CM

## 2017-03-16 DIAGNOSIS — M6281 Muscle weakness (generalized): Secondary | ICD-10-CM | POA: Diagnosis not present

## 2017-03-16 DIAGNOSIS — M545 Low back pain: Principal | ICD-10-CM

## 2017-03-16 DIAGNOSIS — M62838 Other muscle spasm: Secondary | ICD-10-CM

## 2017-03-16 NOTE — Therapy (Signed)
[facility] [address] [place], [place], [address] Phone: [phone]   Fax:  [phone]  Physical Therapy Treatment  Patient Details  Name: Christina [name] MRN: [id number] Date of Birth: 09-Jan-1951 Referring Provider: Hulan Saas, DO   Encounter Date: 03/16/2017  PT End of Session - 03/16/17 1054    Visit Number  7    Number of Visits  13    Date for PT Re-Evaluation  04/04/17    Authorization Type  Medicare A and B     Authorization Time Period  02/21/17 to 03/05/17    Authorization - Visit Number  7    Authorization - Number of Visits  10    PT Start Time  1016    PT Stop Time  1056    PT Time Calculation (min)  40 min    Activity Tolerance  Patient tolerated treatment well;No increased pain    Behavior During Therapy  WFL for tasks assessed/performed       Past Medical History:  Diagnosis Date  . Abrasion of right leg 12/17/2014  . Adrenal insufficiency ([place])   . Breast cancer of upper-outer quadrant of left female breast ([place]) 12/07/2014  . Cataract, immature    bilateral  . Dental bridge present    upper - x 2  . Dental crowns present   . Hypothyroidism   . Prediabetes     Past Surgical History:  Procedure Laterality Date  . ABDOMINOPLASTY    . BALLOON SINUPLASTY    . BREAST LUMPECTOMY WITH RADIOACTIVE SEED AND SENTINEL LYMPH NODE BIOPSY Left 12/22/2014   Performed by Stark Klein, MD at [facility]  . BUNIONECTOMY Right   . HAMMER TOE SURGERY Bilateral   . RHINOPLASTY    . SEPTOPLASTY      There were no vitals filed for this visit.  Subjective Assessment - 03/16/17 1018    Subjective  Pt reports that she made the adjustments to her saddle and riding since her last session, but she did tweek her hip when twisting too far the other day. No pain otherwise.     Currently in Pain?  No/denies    Pain Onset  More than a month ago                      [facility]  Adult PT Treatment/Exercise - 03/16/17 0001      Lumbar Exercises: Supine   Bent Knee Raise  10 reps    Bent Knee Raise Limitations  x2 sets with LE on red physioball     Bridge  5 reps    Bridge Limitations  3sets     Large Ball Oblique Isometric  10 reps;2 seconds 25% effort with UE/LE press     Other Supine Lumbar Exercises  abdominal bracing with bent knee fallout x10 reps each       Lumbar Exercises: Sidelying   Clam  10 reps;Other (comment) abdominal brace with yellow TB      Knee/Hip Exercises: Stretches   Other Knee/Hip Stretches  butterfly stretch with back against wall 3x30 sec       Knee/Hip Exercises: Prone   Hamstring Curl  2 sets;10 reps;Other (comment) yellow TB    Hip Extension  1 set;10 reps;Both             PT Education - 03/16/17 1040    Education provided  Yes    Education Details  technique with  therex; updates to HEP    Person(s) Educated  Patient    Methods  Explanation;Verbal cues;Handout    Comprehension  Returned demonstration;Verbalized understanding       PT Short Term Goals - 03/16/17 1041      PT SHORT TERM GOAL #1   Title  Pt will demo consistency and independence with her HEP to decrease pain and improve BLE strength.     Time  3    Period  Weeks    Status  Achieved      PT SHORT TERM GOAL #2   Title  Pt will report atleast 25% improvement in Rt buttock pain to allow for return to daily brisk walking routine without significant increase in symptoms.     Time  3    Period  Weeks    Status  On-going        PT Long Term Goals - 02/28/17 0924      PT LONG TERM GOAL #1   Title  Pt will demo improved BLE strength to 5/5 MMT which will increase her safety with daily activity.     Time  6    Period  Weeks    Status  On-going      PT LONG TERM GOAL #2   Title  Pt will report being able to return to horse riding atleast 3x/week without difficulty to improve her quality of life and participation in activity with her peers.     Time   6    Period  Weeks    Status  On-going      PT LONG TERM GOAL #3   Title  Pt will report atleast 75% improvement in her symptoms to allow for her to resume all prior activities without difficulty.     Time  6    Period  Weeks    Status  On-going      PT LONG TERM GOAL #4   Title  Pt will demo improved hip stability evident by her ability to maintain single leg balance without LOB or (+) trendelenburg deviation for atleast 15 sec, 2/3 trials.     Time  6    Period  Weeks    Status  On-going            Plan - 03/16/17 1050    Clinical Impression Statement  Pt arrived reporting no increase in soreness following last session's exercises. She also noticed that she was able to ride without pain after making the adjustments recommended by the therapist.  Pt was able to complete up to 10 repetitions of prone hip extension this session without pain, compared to her previous session. She does demonstrate an overall increase in exercise tolerance without pain during her sessions, but will continue to benefit from skilled PT to address limitations in strength, flexibility and decrease pain with full return to activity.     Rehab Potential  Good    PT Frequency  2x / week    PT Duration  6 weeks    PT Treatment/Interventions  ADLs/Self Care Home Management;Electrical Stimulation;Moist Heat;Traction;Balance training;Therapeutic exercise;Therapeutic activities;Functional mobility training;Neuromuscular re-education;Patient/family education;Passive range of motion;Manual techniques;Dry needling;Taping    PT Next Visit Plan  hip ROM, gentle hip strenghtening and trunk stability     PT Home Exercise Plan  ab bracing with isometric LE/UE press, seated butterfly stretch, bridge    Consulted and Agree with Plan of Care  Patient       Patient will benefit from  skilled therapeutic intervention in order to improve the following deficits and impairments:  Decreased balance, Impaired flexibility,  Hypomobility, Decreased strength, Decreased endurance, Decreased activity tolerance, Decreased mobility, Increased muscle spasms, Postural dysfunction, Pain, Improper body mechanics  Visit Diagnosis: Chronic right-sided low back pain, with sciatica presence unspecified  Muscle weakness (generalized)  Other muscle spasm     Problem List Patient Active Problem List   Diagnosis Date Noted  . 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 Breast cancer of upper-outer quadrant of left female breast ([place]) 12/07/2014  . Acute recurrent sinusitis 01/17/2013    10:59 AM,03/16/17 Elly Modena PT, DPT [place] [facility] [place] [facility] [address], [place], [place], [address] Phone: [phone]   Fax:  [phone]  Name: Christina [name] MRN: [id number] Date of Birth: 1951-04-22

## 2017-03-16 NOTE — Patient Instructions (Signed)
  Isometric Oblique Stabilization  From a supine position with both knees bent, bring one knee up to 90 degrees. Resist the lifted knee with the opposite arm to contract the obliques while keeping the head and shoulders on the mat. There should be no movement of the knee during the oblique contraction. 25% effort with press, x10 reps each side    BRIDGING  While lying on your back with knees bent, tighten your lower abdominals, squeeze your buttocks and then raise your buttocks off the floor/bed as creating a "Bridge" with your body. Hold and then lower yourself and repeat. 3x5 reps.      Adductor Stretch (Butterfly)  Ideally you want to support your lower back and sit against a wall but it is not necessary. Bring your heels together and sit upright bringing your feet toward you. You should feel a stretch on the inside of your thighs. If you want more of a stretch you can push gently down on the inside of your knees. hold 30 sec, repeat 3 times.  No pain, stretch only.      [place] [address], [place], [place] [address] Phone # [phone] Fax [phone]

## 2017-03-19 ENCOUNTER — Ambulatory Visit: Payer: Medicare Other | Admitting: Physical Therapy

## 2017-03-19 ENCOUNTER — Encounter: Payer: Self-pay | Admitting: Physical Therapy

## 2017-03-19 DIAGNOSIS — M545 Low back pain: Secondary | ICD-10-CM | POA: Diagnosis not present

## 2017-03-19 DIAGNOSIS — M6281 Muscle weakness (generalized): Secondary | ICD-10-CM | POA: Diagnosis not present

## 2017-03-19 DIAGNOSIS — G8929 Other chronic pain: Secondary | ICD-10-CM | POA: Diagnosis not present

## 2017-03-19 DIAGNOSIS — M62838 Other muscle spasm: Secondary | ICD-10-CM | POA: Diagnosis not present

## 2017-03-19 NOTE — Therapy (Signed)
[facility] [address] [place], [place], [address] Phone: [phone]   Fax:  [phone]  Physical Therapy Treatment  Patient Details  Name: Christina [name] MRN: [id number] Date of Birth: 1950-07-22 Referring Provider: Hulan Saas, DO   Encounter Date: 03/19/2017  PT End of Session - 03/19/17 1009    Visit Number  8    Number of Visits  13    Date for PT Re-Evaluation  04/04/17    Authorization Type  Medicare A and B     Authorization Time Period  02/21/17 to 03/05/17    Authorization - Visit Number  8    Authorization - Number of Visits  10    PT Start Time  0930    PT Stop Time  1011    PT Time Calculation (min)  41 min    Activity Tolerance  Patient tolerated treatment well;No increased pain    Behavior During Therapy  WFL for tasks assessed/performed       Past Medical History:  Diagnosis Date  . Abrasion of right leg 12/17/2014  . Adrenal insufficiency ([place])   . Breast cancer of upper-outer quadrant of left female breast ([place]) 12/07/2014  . Cataract, immature    bilateral  . Dental bridge present    upper - x 2  . Dental crowns present   . Hypothyroidism   . Prediabetes     Past Surgical History:  Procedure Laterality Date  . ABDOMINOPLASTY    . BALLOON SINUPLASTY    . BREAST LUMPECTOMY WITH RADIOACTIVE SEED AND SENTINEL LYMPH NODE BIOPSY Left 12/22/2014   Performed by Stark Klein, MD at [facility]  . BUNIONECTOMY Right   . HAMMER TOE SURGERY Bilateral   . RHINOPLASTY    . SEPTOPLASTY      There were no vitals filed for this visit.  Subjective Assessment - 03/19/17 0934    Subjective  I was very sore after last visit so I was not able to do anything since last visit.     Limitations  House hold activities;Walking    How long can you walk comfortably?  unable to power walk     Patient Stated Goals  improve her ability to complete exercise and horse riding without  significant pain (currently only riding 2x/week).     Currently in Pain?  Yes    Pain Score  3     Pain Location  Groin    Pain Orientation  Right    Pain Descriptors / Indicators  Sharp;Nagging    Pain Type  Chronic pain    Pain Radiating Towards  wraps from groinn to the middle of buttocks    Pain Onset  More than a month ago    Pain Frequency  Intermittent    Aggravating Factors   walking up hills, walking for long distances    Pain Relieving Factors  PEMF, injections    Effect of Pain on Daily Activities  limited activity participation    Multiple Pain Sites  No         [facility] PT Assessment - 03/19/17 0001      Precautions   Precautions  Other (comment)    Precaution Comments  no dry needling      Palpation   SI assessment   pelvis in correct alignment                  [facility] Adult PT Treatment/Exercise - 03/19/17 0001  Lumbar Exercises: Stretches   Single Knee to Chest Stretch  2 reps;20 seconds therapist stretched with hip IR    Piriformis Stretch  -- therapist stretche right piriformis in differenet directions      Lumbar Exercises: Supine   Clam  10 reps;1 second alternate sides; abdominal bracing    Bridge  5 reps;1 second    Bridge Limitations  small motion      Manual Therapy   Manual Therapy  Soft tissue mobilization;Joint mobilization    Manual therapy comments  prone anterior glide of right hip with extension    Joint Mobilization  inferior glide, anterior glide, posterior glide, medial glide, and distration to right hip grade 3    Soft tissue mobilization  right pirirformis, coccygeus, around ishcial tuberosity             PT Education - 03/19/17 1009    Education provided  Yes    Education Details  use tennis ball to do self massage of right buttocks    Person(s) Educated  Patient    Methods  Explanation;Verbal cues    Comprehension  Verbalized understanding       PT Short Term Goals - 03/19/17 0938      PT SHORT TERM GOAL  #2   Title  Pt will report atleast 25% improvement in Rt buttock pain to allow for return to daily brisk walking routine without significant increase in symptoms.     Baseline  pain decreased until Sat. when she had a flare-up    Time  3    Period  Weeks    Status  On-going        PT Long Term Goals - 02/28/17 2956      PT LONG TERM GOAL #1   Title  Pt will demo improved BLE strength to 5/5 MMT which will increase her safety with daily activity.     Time  6    Period  Weeks    Status  On-going      PT LONG TERM GOAL #2   Title  Pt will report being able to return to horse riding atleast 3x/week without difficulty to improve her quality of life and participation in activity with her peers.     Time  6    Period  Weeks    Status  On-going      PT LONG TERM GOAL #3   Title  Pt will report atleast 75% improvement in her symptoms to allow for her to resume all prior activities without difficulty.     Time  6    Period  Weeks    Status  On-going      PT LONG TERM GOAL #4   Title  Pt will demo improved hip stability evident by her ability to maintain single leg balance without LOB or (+) trendelenburg deviation for atleast 15 sec, 2/3 trials.     Time  6    Period  Weeks    Status  On-going            Plan - 03/19/17 1010    Clinical Impression Statement  Patient had a recent flare-up from last visit due to challenging exercises.  After therapy, patient reports 25% decreased in pain.  Patient pelvis in correct alignment.  Patient has trigger points in right coccygeus, and pirirformis and around the right ishcial tuberosity. Patient works best with low level exercises.     Rehab Potential  Good    PT  Frequency  2x / week    PT Duration  6 weeks    PT Treatment/Interventions  ADLs/Self Care Home Management;Electrical Stimulation;Moist Heat;Traction;Balance training;Therapeutic exercise;Therapeutic activities;Functional mobility training;Neuromuscular re-education;Patient/family  education;Passive range of motion;Manual techniques;Dry needling;Taping    PT Next Visit Plan  hip ROM, gentle hip strenghtening and trunk stability     PT Home Exercise Plan  ab bracing with isometric LE/UE press, seated butterfly stretch, bridge    Recommended Other Services  MD signed initial evaluation    Consulted and Agree with Plan of Care  Patient       Patient will benefit from skilled therapeutic intervention in order to improve the following deficits and impairments:  Decreased balance, Impaired flexibility, Hypomobility, Decreased strength, Decreased endurance, Decreased activity tolerance, Decreased mobility, Increased muscle spasms, Postural dysfunction, Pain, Improper body mechanics  Visit Diagnosis: Chronic right-sided low back pain, with sciatica presence unspecified  Muscle weakness (generalized)  Other muscle spasm     Problem List Patient Active Problem List   Diagnosis Date Noted  . 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 Breast cancer of upper-outer quadrant of left female breast ([place]) 12/07/2014  . Acute recurrent sinusitis 01/17/2013   Earlie Counts, PT 03/19/17 10:13 AM   [facility] [address], [place], [place], [address] Phone: [phone]   Fax:  [phone]  Name: Christina [name] MRN: [id number] Date of Birth: 01/29/1951

## 2017-03-21 ENCOUNTER — Encounter: Payer: Medicare Other | Admitting: Physical Therapy

## 2017-03-26 DIAGNOSIS — Z85828 Personal history of other malignant neoplasm of skin: Secondary | ICD-10-CM | POA: Diagnosis not present

## 2017-03-26 DIAGNOSIS — L570 Actinic keratosis: Secondary | ICD-10-CM | POA: Diagnosis not present

## 2017-03-26 DIAGNOSIS — C44529 Squamous cell carcinoma of skin of other part of trunk: Secondary | ICD-10-CM | POA: Diagnosis not present

## 2017-03-26 DIAGNOSIS — D485 Neoplasm of uncertain behavior of skin: Secondary | ICD-10-CM | POA: Diagnosis not present

## 2017-03-26 DIAGNOSIS — L821 Other seborrheic keratosis: Secondary | ICD-10-CM | POA: Diagnosis not present

## 2017-03-28 ENCOUNTER — Ambulatory Visit: Payer: Medicare Other | Admitting: Physical Therapy

## 2017-03-28 DIAGNOSIS — G8929 Other chronic pain: Secondary | ICD-10-CM | POA: Diagnosis not present

## 2017-03-28 DIAGNOSIS — M6281 Muscle weakness (generalized): Secondary | ICD-10-CM

## 2017-03-28 DIAGNOSIS — M62838 Other muscle spasm: Secondary | ICD-10-CM | POA: Diagnosis not present

## 2017-03-28 DIAGNOSIS — M545 Low back pain: Secondary | ICD-10-CM | POA: Diagnosis not present

## 2017-03-28 NOTE — Patient Instructions (Signed)
  KNEE FALL OUT   While lying on your back with both knees bent, stabilize your spine by bracing your abdominal muscles. Hold this contraction as you slowly lower one knee to the side. Your pelvis should not move.   You can place your thumbs on your pelvic bone to get feedback of any movements that occur. If your pelvis moves too much, then next time lower the leg less to maintain good control.     x10 reps each   HIP FLEXOR STRETCH 2  While lying on a table or high bed, let the affected leg lower towards the floor until a stretch is felt along the front of your thigh.   At the same time, grasp your opposite knee and pull it towards your chest.   Hold 30 sec, repeat 2x each side     Glute Med Trigger Point  While laying on your back, use a lacrosse ball and find the PSIS (bony knobs) and go right below that. Once found, apply pressure using body weight and lightly roll around the area to break down and help relax the connective tissue and muscles.   Atleast 1-2 min every day      Hip extension Isometric  Laying on your back, push involved leg/heel into the floor and hold for 3 sec. for support you can bend the other knee. Keep abdomen tightened.  x10 reps each , or 2x5    [facility] [address] [place], [place] [address] Phone # [phone] Fax [phone]

## 2017-03-28 NOTE — Therapy (Signed)
[facility] [address], [place], [place], [address] Phone: [phone]   Fax:  [phone]  Physical Therapy Treatment  Patient Details  Name: Christina [name] MRN: [id number] Date of Birth: 12-07-1950 Referring Provider: Hulan Saas, DO   Encounter Date: 03/28/2017  PT End of Session - 03/28/17 1100    Visit Number  9    Number of Visits  13    Date for PT Re-Evaluation  04/04/17    Authorization Type  Medicare A and B     Authorization Time Period  02/21/17 to 03/05/17    Authorization - Visit Number  9    Authorization - Number of Visits  10    PT Start Time  6962    PT Stop Time  9528    PT Time Calculation (min)  43 min    Activity Tolerance  Patient tolerated treatment well;No increased pain    Behavior During Therapy  WFL for tasks assessed/performed       Past Medical History:  Diagnosis Date  . Abrasion of right leg 12/17/2014  . Adrenal insufficiency ([place])   . Breast cancer of upper-outer quadrant of left female breast ([place]) 12/07/2014  . Cataract, immature    bilateral  . Dental bridge present    upper - x 2  . Dental crowns present   . Hypothyroidism   . Prediabetes     Past Surgical History:  Procedure Laterality Date  . ABDOMINOPLASTY    . BALLOON SINUPLASTY    . BREAST LUMPECTOMY WITH RADIOACTIVE SEED AND SENTINEL LYMPH NODE BIOPSY Left 12/22/2014   Procedure: BREAST LUMPECTOMY WITH RADIOACTIVE SEED AND SENTINEL LYMPH NODE BIOPSY;  Surgeon: Stark Klein, MD;  Location: [facility];  Service: General;  Laterality: Left;  . BUNIONECTOMY Right   . HAMMER TOE SURGERY Bilateral   . RHINOPLASTY    . SEPTOPLASTY      There were no vitals filed for this visit.  Subjective Assessment - 03/28/17 1020    Subjective  Pt reports that things were good with riding yesterday. She feels better today than she did following her last session. She has been completing one of her stretches  and the butterfly is almost impossible.     Limitations  House hold activities;Walking    How long can you walk comfortably?  unable to power walk     Patient Stated Goals  improve her ability to complete exercise and horse riding without significant pain (currently only riding 2x/week).     Currently in Pain?  Yes    Pain Score  3     Pain Location  Buttocks    Pain Orientation  Right    Pain Descriptors / Indicators  Aching    Pain Type  Chronic pain    Pain Radiating Towards  none     Pain Onset  More than a month ago    Pain Frequency  Intermittent    Aggravating Factors   unsure                       [facility] Adult PT Treatment/Exercise - 03/28/17 0001      Lumbar Exercises: Standing   Other Standing Lumbar Exercises  wall squat x10 reps       Lumbar Exercises: Supine   Ab Set  10 reps;3 seconds    Other Supine Lumbar Exercises  abdominal bracing with hip extension press into table 2x5 reps  each     Other Supine Lumbar Exercises  hip abduction slide x10 reps each       Knee/Hip Exercises: Aerobic   Nustep  L1 x5 min therapist present providing education and instruction       Manual Therapy   Manual Therapy  Passive ROM    Passive ROM  Rt piriformis stretch 3x20 sec              PT Education - 03/28/17 1058    Education provided  Yes    Education Details  importance of pacing activity despite "good" days which will allow her to avoid over doing things and feeling worse the following day; use and set up of ball for self massage along the gluteals    Person(s) Educated  Patient    Methods  Explanation;Verbal cues;Handout    Comprehension  Returned demonstration;Verbalized understanding       PT Short Term Goals - 03/19/17 0938      PT SHORT TERM GOAL #2   Title  Pt will report atleast 25% improvement in Rt buttock pain to allow for return to daily brisk walking routine without significant increase in symptoms.     Baseline  pain decreased until Sat.  when she had a flare-up    Time  3    Period  Weeks    Status  On-going        PT Long Term Goals - 02/28/17 1884      PT LONG TERM GOAL #1   Title  Pt will demo improved BLE strength to 5/5 MMT which will increase her safety with daily activity.     Time  6    Period  Weeks    Status  On-going      PT LONG TERM GOAL #2   Title  Pt will report being able to return to horse riding atleast 3x/week without difficulty to improve her quality of life and participation in activity with her peers.     Time  6    Period  Weeks    Status  On-going      PT LONG TERM GOAL #3   Title  Pt will report atleast 75% improvement in her symptoms to allow for her to resume all prior activities without difficulty.     Time  6    Period  Weeks    Status  On-going      PT LONG TERM GOAL #4   Title  Pt will demo improved hip stability evident by her ability to maintain single leg balance without LOB or (+) trendelenburg deviation for atleast 15 sec, 2/3 trials.     Time  6    Period  Weeks    Status  On-going            Plan - 03/28/17 1100    Clinical Impression Statement  Pt arrived today reporting feeling very sore following her last session. Her pain presentation and response to treatments continues to be variable, making it difficult to gradually progress exercise as needed. Today's session, she was able to complete Nustep for up to 5 minutes without difficulty compared to previous sessions. Completed therex to promote trunk/hip strength and therapist reviewed set up and use of the massage ball at home for self-trigger point release. Ended session without any report of pain.     Rehab Potential  Good    PT Frequency  2x / week    PT Duration  6 weeks  PT Treatment/Interventions  ADLs/Self Care Home Management;Electrical Stimulation;Moist Heat;Traction;Balance training;Therapeutic exercise;Therapeutic activities;Functional mobility training;Neuromuscular re-education;Patient/family  education;Passive range of motion;Manual techniques;Dry needling;Taping    PT Next Visit Plan  hip ROM, gentle hip strengthening and trunk stability ; f/u on self massage    PT Home Exercise Plan  --    Consulted and Agree with Plan of Care  Patient       Patient will benefit from skilled therapeutic intervention in order to improve the following deficits and impairments:  Decreased balance, Impaired flexibility, Hypomobility, Decreased strength, Decreased endurance, Decreased activity tolerance, Decreased mobility, Increased muscle spasms, Postural dysfunction, Pain, Improper body mechanics  Visit Diagnosis: Chronic right-sided low back pain, with sciatica presence unspecified  Muscle weakness (generalized)  Other muscle spasm     Problem List Patient Active Problem List   Diagnosis Date Noted  . 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 Breast cancer of upper-outer quadrant of left female breast ([place]) 12/07/2014  . Acute recurrent sinusitis 01/17/2013   11:16 AM,03/28/17 Elly Modena PT, DPT [place] [address] [place], [place], [address] Phone: [phone]   Fax:  [phone]  Name: Christina [name] MRN: [id number] Date of Birth: Nov 28, 1950

## 2017-03-29 DIAGNOSIS — Z23 Encounter for immunization: Secondary | ICD-10-CM | POA: Diagnosis not present

## 2017-03-30 ENCOUNTER — Encounter: Payer: Self-pay | Admitting: Physical Therapy

## 2017-03-30 ENCOUNTER — Ambulatory Visit: Payer: Medicare Other | Admitting: Physical Therapy

## 2017-03-30 DIAGNOSIS — M545 Low back pain: Principal | ICD-10-CM

## 2017-03-30 DIAGNOSIS — M6281 Muscle weakness (generalized): Secondary | ICD-10-CM | POA: Diagnosis not present

## 2017-03-30 DIAGNOSIS — M62838 Other muscle spasm: Secondary | ICD-10-CM | POA: Diagnosis not present

## 2017-03-30 DIAGNOSIS — G8929 Other chronic pain: Secondary | ICD-10-CM | POA: Diagnosis not present

## 2017-03-30 NOTE — Therapy (Addendum)
[facility] [address], [place], [place], [address] Phone: [phone]   Fax:  [phone]  Physical Therapy Treatment  Patient Details  Name: Christina [name] MRN: [id number] Date of Birth: [birth year] Referring Provider: Hulan Saas, DO   Encounter Date: 03/30/2017  PT End of Session - 03/30/17 1015    Visit Number  10    Number of Visits  13    Date for PT Re-Evaluation  04/04/17    Authorization Type  Medicare A and B     Authorization Time Period  02/21/17 to 03/05/17    PT Start Time  1015    PT Stop Time  1105    PT Time Calculation (min)  50 min    Activity Tolerance  Patient tolerated treatment well;No increased pain    Behavior During Therapy  WFL for tasks assessed/performed       Past Medical History:  Diagnosis Date  . Abrasion of right leg 12/17/2014  . Adrenal insufficiency ([place])   . Breast cancer of upper-outer quadrant of left female breast ([place]) 12/07/2014  . Cataract, immature    bilateral  . Dental bridge present    upper - x 2  . Dental crowns present   . Hypothyroidism   . Prediabetes     Past Surgical History:  Procedure Laterality Date  . ABDOMINOPLASTY    . BALLOON SINUPLASTY    . BREAST LUMPECTOMY WITH RADIOACTIVE SEED AND SENTINEL LYMPH NODE BIOPSY Left 12/22/2014   Procedure: BREAST LUMPECTOMY WITH RADIOACTIVE SEED AND SENTINEL LYMPH NODE BIOPSY;  Surgeon: Stark Klein, MD;  Location: [facility];  Service: General;  Laterality: Left;  . BUNIONECTOMY Right   . HAMMER TOE SURGERY Bilateral   . RHINOPLASTY    . SEPTOPLASTY      There were no vitals filed for this visit.  Subjective Assessment - 03/30/17 1016    Subjective  Walked yesterday, riding today, and only some stiffness in my back this morning.     Currently in Pain?  No/denies    Multiple Pain Sites  No                      [facility] Adult PT Treatment/Exercise - 03/30/17 0001      Lumbar Exercises: Supine   Clam  -- 2x5,RT, VC    Other Supine Lumbar Exercises  Ta contraction with hip extension 5x VC for easy effort and to kepp RTLE neutral. RT only    Other Supine Lumbar Exercises  Foam roll LE lengthener      Lumbar Exercises: Sidelying   Clam  -- 2x4, TC to stay in plane      Knee/Hip Exercises: Aerobic   Nustep  L1 x5 min therapist present providing education and instruction       Knee/Hip Exercises: Seated   Sit to Sand  -- 2x6 TC for LE postioning RTLE in neutral,       Manual Therapy   Soft tissue mobilization  RT lateral/posterior hip upgrade with rockblades in sidelying             PT Education - 03/30/17 1058    Education provided  Yes    Education Details  protocol to follow for glute facilitation for HEP    Person(s) Educated  Patient    Methods  Explanation;Demonstration;Verbal cues;Handout    Comprehension  Verbalized understanding;Returned demonstration       PT Short Term Goals -  03/19/17 0938      PT SHORT TERM GOAL #2   Title  Pt will report atleast 25% improvement in Rt buttock pain to allow for return to daily brisk walking routine without significant increase in symptoms.     Baseline  pain decreased until Sat. when she had a flare-up    Time  3    Period  Weeks    Status  On-going        PT Long Term Goals - 02/28/17 3235      PT LONG TERM GOAL #1   Title  Pt will demo improved BLE strength to 5/5 MMT which will increase her safety with daily activity.     Time  6    Period  Weeks    Status  On-going      PT LONG TERM GOAL #2   Title  Pt will report being able to return to horse riding atleast 3x/week without difficulty to improve her quality of life and participation in activity with her peers.     Time  6    Period  Weeks    Status  On-going      PT LONG TERM GOAL #3   Title  Pt will report atleast 75% improvement in her symptoms to allow for her to resume all prior activities without difficulty.     Time  6     Period  Weeks    Status  On-going      PT LONG TERM GOAL #4   Title  Pt will demo improved hip stability evident by her ability to maintain single leg balance without LOB or (+) trendelenburg deviation for atleast 15 sec, 2/3 trials.     Time  6    Period  Weeks    Status  On-going            Plan - 03/30/17 1021    Clinical Impression Statement  Pt reports little pain today even after 45 minute walk yesterday and plans to go to the barn today and walk her horse, maybe ride some. Pt did excellent with sit to stand exercise facilitating through her quads and glutes properly and without pain. She remains with her "habit" of internally rotating her femurs with adduction  ( like being on a horse)  upon standing and when standing. We discussed at length the importance of dissassociating her back from her hip in order to properly strengthen her hips.  She admitts to being fearful of moving any part of her RT posterior chain and will work on ways we discussed to decrease that fear and use her muscles better.     Rehab Potential  Good    PT Frequency  2x / week    PT Duration  6 weeks    PT Treatment/Interventions  ADLs/Self Care Home Management;Electrical Stimulation;Moist Heat;Traction;Balance training;Therapeutic exercise;Therapeutic activities;Functional mobility training;Neuromuscular re-education;Patient/family education;Passive range of motion;Manual techniques;Dry needling;Taping    PT Next Visit Plan  hip ROM, gentle hip strengthening and trunk stability ; f/u on self massage    Consulted and Agree with Plan of Care  Patient       Patient will benefit from skilled therapeutic intervention in order to improve the following deficits and impairments:  Decreased balance, Impaired flexibility, Hypomobility, Decreased strength, Decreased endurance, Decreased activity tolerance, Decreased mobility, Increased muscle spasms, Postural dysfunction, Pain, Improper body mechanics  Visit  Diagnosis: Chronic right-sided low back pain, with sciatica presence unspecified  Muscle weakness (generalized)  Other  muscle spasm     Problem List Patient Active Problem List   Diagnosis Date Noted  . 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 Breast cancer of upper-outer quadrant of left female breast ([place]) 12/07/2014  . Acute recurrent sinusitis 01/17/2013    Neale Marzette, PTA 03/30/2017, 12:03 PM  [facility] [address] [place], [place], [address] Phone: [phone]   Fax:  [phone]  Name: ZAVIA PULLEN MRN: [id number] Date of Birth: 21-Jun-1950   *Addendum to include G-Codes based on FOTO  Score of 41%  Functional limitation: mobility walking and moving around Current: 40-60% limited or restricted Goal: 20-40% limited or restricted   11:19 AM,04/02/17 Elly Modena PT, [place] [facility]

## 2017-03-30 NOTE — Patient Instructions (Signed)
Goal: to be less fearful of using the RT buttocks muscle in order to restore its ability to function.  -Start with laying flat, but be comfortable, and begin to contract/squeeze the buttocks muscle. Hold for 2-3 sec so the brain "feels" it. Do 4-6x, do LESS but really sink your mind into being "ok" using the buttocks. Be sure to relax your back and other places you might have a tendency to "grip."   - THEN...when you feel like this is better and you feel "solid" in your buttocks muscles, bend the RT leg and put a little more weight into the "leg" as you contract the Rt glutes just like you did in the above exercise. Again, do only like 4-6x. Work on the mental aspect of gaining confidence.

## 2017-04-03 ENCOUNTER — Encounter: Payer: Self-pay | Admitting: Family Medicine

## 2017-04-03 ENCOUNTER — Ambulatory Visit (INDEPENDENT_AMBULATORY_CARE_PROVIDER_SITE_OTHER): Payer: Medicare Other | Admitting: Family Medicine

## 2017-04-03 DIAGNOSIS — S3421XD Injury of nerve root of lumbar spine, subsequent encounter: Secondary | ICD-10-CM | POA: Diagnosis not present

## 2017-04-03 MED ORDER — CYCLOBENZAPRINE HCL 10 MG PO TABS: 10.0000 mg | ORAL_TABLET | Freq: Three times a day (TID) | ORAL | 0 refills | Status: DC | PRN

## 2017-04-03 NOTE — Patient Instructions (Signed)
Good to see you  Lets see what PT says Refilled the flexeril  Ice is your friend.  Stay active  Neurosurgeon- Look up [facility] or [employer] integr[facility] medicine  See me again in 2 months or call me sooner if you consider doing something else  Happy holidays!

## 2017-04-03 NOTE — Assessment & Plan Note (Signed)
Patient is stable at this time.  We discussed icing start to increase activity slowly over the course the next several days.  Discussed the core strengthening.  Patient has exhausted all other conservative therapy so if no improvement patient would need potential surgical intervention.  Patient will consider this but wants to continue what she is doing at this point and follow-up again in 2-3 months.

## 2017-04-03 NOTE — Progress Notes (Signed)
[name] [place], [place] [address] Phone: [phone] Subjective:    I'm seeing this patient by the request  of:    CC: Back pain follow-up  GNO:[id number]  Christina [name] is a 66 y.o. female coming in with complaint of back pain.  Has been seen previously and found to have an L3 nerve root impingement.  On it fairly well to nerve root injection previously and then was given an epidural September 13.  Only mild improvement and started on formal physical therapy.  Patient states     MRI July fifth 2018 showed patient with a right-sided L3 nerve root impingement with moderate foraminal stenosis at L4-L5 as well.  This was independently visualized by me.  Past Medical History:  Diagnosis Date  . Abrasion of right leg 12/17/2014  . Adrenal insufficiency ([place])   . Breast cancer of upper-outer quadrant of left female breast ([place]) 12/07/2014  . Cataract, immature    bilateral  . Dental bridge present    upper - x 2  . Dental crowns present   . Hypothyroidism   . Prediabetes    Past Surgical History:  Procedure Laterality Date  . ABDOMINOPLASTY    . BALLOON SINUPLASTY    . BREAST LUMPECTOMY WITH RADIOACTIVE SEED AND SENTINEL LYMPH NODE BIOPSY Left 12/22/2014   Procedure: BREAST LUMPECTOMY WITH RADIOACTIVE SEED AND SENTINEL LYMPH NODE BIOPSY;  Surgeon: Stark Klein, MD;  Location: [place];  Service: General;  Laterality: Left;  . BUNIONECTOMY Right   . HAMMER TOE SURGERY Bilateral   . RHINOPLASTY    . SEPTOPLASTY     Social History   Socioeconomic History  . Marital status: Widowed    Spouse name: Not on file  . Number of children: Not on file  . Years of education: Not on file  . Highest education level: Not on file  Social Needs  . Financial resource strain: Not on file  . Food insecurity - worry: Not on file  . Food insecurity - inability: Not on file  . Transportation needs - medical: Not on file  .  Transportation needs - non-medical: Not on file  Occupational History    Employer: [employer]  Tobacco Use  . Smoking status: Former Smoker    Packs/day: 0.00    Years: 0.00    Pack years: 0.00    Last attempt to quit: 05/01/1980    Years since quitting: 36.9  . Smokeless tobacco: Never Used  Substance and Sexual Activity  . Alcohol use: Yes    Alcohol/week: 0.0 oz    Comment: occasionally  . Drug use: No  . Sexual activity: Not on file  Other Topics Concern  . Not on file  Social History Narrative  . Not on file   Allergies  Allergen Reactions  . Celery Oil Other (See Comments)    FEVER BLISTERS  . Doxycycline Nausea And Vomiting  . Rye Grass Flower Pollen Extract [Gramineae Pollens] Swelling    RYE:  SWELLING THROAT - DENIES SOB  . Tea Swelling    THROAT - DENIES SOB  . Adhesive [Tape] Other (See Comments)    TEARS SKIN  . Sulfa Antibiotics Swelling    NOSE   Family History  Problem Relation Age of Onset  . Diabetes Mother   . Diabetes Father   . Cancer Maternal Uncle        pancreatic cancer   . Diabetes Paternal Grandmother   .  Diabetes Paternal Grandfather      Past medical history, social, surgical and family history all reviewed in electronic medical record.  No pertanent information unless stated regarding to the chief complaint.   Review of Systems:Review of systems updated and as accurate as of 04/03/17  No headache, visual changes, nausea, vomiting, diarrhea, constipation, dizziness, abdominal pain, skin rash, fevers, chills, night sweats, weight loss, swollen lymph nodes, body aches, joint swelling, muscle aches, chest pain, shortness of breath, mood changes.   Objective  Blood pressure 138/72, pulse 79, height 5' 3.5" (1.613 m), weight 108 lb (49 kg). Systems examined below as of 04/03/17   General: No apparent distress alert and oriented x3 mood and affect normal, dressed appropriately.  HEENT: Pupils equal, extraocular movements intact    Respiratory: Patient's speak in full sentences and does not appear short of breath  Cardiovascular: No lower extremity edema, non tender, no erythema  Skin: Warm dry intact with no signs of infection or rash on extremities or on axial skeleton.  Abdomen: Soft nontender  Neuro: Cranial nerves II through XII are intact, neurovascularly intact in all extremities with 2+ DTRs and 2+ pulses.  Lymph: No lymphadenopathy of posterior or anterior cervical chain or axillae bilaterally.  Gait normal with good balance and coordination.  MSK:  Non tender with full range of motion and good stability and symmetric strength and tone of shoulders, elbows, wrist, hip, knee and ankles bilaterally.  Arthritic changes of multiple joints  Back exam shows the patient does have some loss of lordosis.  Some very mild degenerative scoliosis noted.  Near full range of motion no.  Negative.  Positive Faber on the right side.  Mild discomfort to palpation over the paraspinal musculature of the lumbar spine on the right side as well as in the right gluteal region.     Impression and Recommendations:     This case required medical decision making of moderate complexity.      Note: This dictation was prepared with Dragon dictation along with smaller phrase technology. Any transcriptional errors that result from this process are unintentional.

## 2017-04-04 ENCOUNTER — Ambulatory Visit: Payer: Medicare Other | Attending: Family Medicine | Admitting: Physical Therapy

## 2017-04-04 DIAGNOSIS — M62838 Other muscle spasm: Secondary | ICD-10-CM | POA: Insufficient documentation

## 2017-04-04 DIAGNOSIS — M545 Low back pain: Secondary | ICD-10-CM | POA: Insufficient documentation

## 2017-04-04 DIAGNOSIS — M6281 Muscle weakness (generalized): Secondary | ICD-10-CM | POA: Diagnosis not present

## 2017-04-04 DIAGNOSIS — G8929 Other chronic pain: Secondary | ICD-10-CM

## 2017-04-04 NOTE — Therapy (Signed)
[facility] [address] [place], [place], [address] Phone: [phone]   Fax:  [phone]  Physical Therapy Treatment/Reassessment  / Patient Details  Name: Christina [name] MRN: [id number] Date of Birth: 1950-10-04 Referring Provider: Hulan Saas, DO    Encounter Date: 04/04/2017  PT End of Session - 04/04/17 1705    Visit Number  11    Number of Visits  13    Date for PT Re-Evaluation  05/16/17    Authorization Type  Medicare A and B (G-codes completed on 06/21/2022 visit)    Authorization Time Period  04/05/17 to 05/16/17    PT Start Time  1615    PT Stop Time  1658    PT Time Calculation (min)  43 min    Activity Tolerance  Patient tolerated treatment well;No increased pain    Behavior During Therapy  WFL for tasks assessed/performed       Past Medical History:  Diagnosis Date  . Abrasion of right leg 12/17/2014  . Adrenal insufficiency ([place])   . Breast cancer of upper-outer quadrant of left female breast ([place]) 12/07/2014  . Cataract, immature    bilateral  . Dental bridge present    upper - x 2  . Dental crowns present   . Hypothyroidism   . Prediabetes     Past Surgical History:  Procedure Laterality Date  . ABDOMINOPLASTY    . BALLOON SINUPLASTY    . BREAST LUMPECTOMY WITH RADIOACTIVE SEED AND SENTINEL LYMPH NODE BIOPSY Left 12/22/2014   Procedure: BREAST LUMPECTOMY WITH RADIOACTIVE SEED AND SENTINEL LYMPH NODE BIOPSY;  Surgeon: Stark Klein, MD;  Location: [place];  Service: General;  Laterality: Left;  . BUNIONECTOMY Right   . HAMMER TOE SURGERY Bilateral   . RHINOPLASTY    . SEPTOPLASTY      There were no vitals filed for this visit.  Subjective Assessment - 04/04/17 1618    Subjective  Pt reports that she will have some good days and some bad days. For the most part, she feels that she is having more good days than she was prior to starting therapy improved almost 50% overall.      Limitations  House hold activities;Walking    How long can you walk comfortably?  unable to power walk     Patient Stated Goals  improve her ability to complete exercise and horse riding without significant pain (currently only riding 2x/week).     Currently in Pain?  No/denies    Pain Location  -- Rt inner thigh          [facility] PT Assessment - 04/04/17 0001      Assessment   Medical Diagnosis  Lumbar radiculopathy     Referring Provider  Hulan Saas, DO     Onset Date/Surgical Date  -- ~3-6 months ago     Prior Therapy  none       [place] residence      Prior Function   Level of Independence  Independent      Observation/Other Assessments   Focus on Therapeutic Outcomes (FOTO)   41% limited       Sensation   Light Touch  Appears Intact    Additional Comments  Pt denies dumbness on the anterior thigh       Functional Tests   Functional tests  Squat;Single leg stance;Sit to Stand      Squat  Comments  x10 reps, (+) Rt knee valgus       Single Leg Stance   Comments  (+) trendelenburg on the Lt, Rt: 8 sec Lt 7 sec       Sit to Stand   Comments  able to stand without use of UEs       AROM   Lumbar Flexion  WNL, pain free    Lumbar Extension  WNL, pain free     Lumbar - Right Side Bend  WNL, pain free    Lumbar - Left Side Bend  WNL, pain free      Strength   Overall Strength Comments  trunk strength: 3+/5, leg lower to 65 deg only     Right Hip Flexion  4-/5 pain reported inner thigh     Right Hip Extension  4+/5    Right Hip ABduction  4+/5    Left Hip Flexion  4/5    Left Hip Extension  4+/5 (+) pain inner thigh     Left Hip ABduction  4+/5    Right Knee Extension  5/5    Left Knee Extension  5/5    Right Ankle Dorsiflexion  5/5    Left Ankle Dorsiflexion  5/5      Flexibility   Soft Tissue Assessment /Muscle Length  yes    Hamstrings  WNL    Piriformis  25% limited       Palpation   Spinal mobility   hypomobile throughout Lumbar spine, non tender     Palpation comment  Rt glute med and piriformis tender with palpation but pt reports improved overall                   [facility] Adult PT Treatment/Exercise - 04/04/17 0001      Knee/Hip Exercises: Standing   Wall Squat  1 set;5 reps;Limitations    Wall Squat Limitations  Free squat x10 reps with mirror feedback to demonstrate valgus deviation       Manual Therapy   Joint Mobilization  Grade III/IV CPAs lumbar spine; Grade III-IV medial tibial rotation mobilization on the Rt              PT Education - 04/04/17 1733    Education provided  Yes    Education Details  reassessment findings/POC moving forward; importance of addressing restrictions in mobility along the LE chain    Person(s) Educated  Patient    Methods  Explanation    Comprehension  Verbalized understanding       PT Short Term Goals - 04/04/17 1641      PT SHORT TERM GOAL #1   Title  Pt will demo consistency and independence with her HEP to decrease pain and improve BLE strength.     Time  3    Period  Weeks    Status  Achieved      PT SHORT TERM GOAL #2   Title  Pt will report atleast 25% improvement in Rt buttock pain to allow for return to daily brisk walking routine without significant increase in symptoms.     Baseline  50% improved     Time  3    Period  Weeks    Status  Achieved      PT SHORT TERM GOAL #3   Title  Pt will demonstrate Rt tibial inversion to atleast 20 degrees to improve her mechanics with squatting and other closed chain activity.     Time  3  Period  Weeks    Status  New    Target Date  04/25/17        PT Long Term Goals - 04/04/17 1642      PT LONG TERM GOAL #1   Title  Pt will demo improved BLE strength to 5/5 MMT which will increase her safety with daily activity.     Baseline  up to 4/5 MMT     Time  6    Period  Weeks    Status  Partially Met    Target Date  05/16/17      PT LONG TERM GOAL #2   Title   Pt will report being able to return to horse riding atleast 3x/week without difficulty to improve her quality of life and participation in activity with her peers.     Baseline  1x/week is ideal in the winter time     Time  6    Period  Weeks    Status  Deferred      PT LONG TERM GOAL #3   Title  Pt will report atleast 75% improvement in her symptoms to allow for her to resume all prior activities without difficulty.     Baseline  50% improved     Time  6    Period  Weeks    Status  Partially Met      PT LONG TERM GOAL #4   Title  Pt will demo improved hip stability evident by her ability to maintain single leg balance without LOB or (+) trendelenburg deviation for atleast 10 sec, 2/3 trials.     Time  6    Period  Weeks    Status  Revised      PT LONG TERM GOAL #5   Title  Pt will demo proper BLE squat technique without Rt knee valgus deviation x5 reps.     Time  6    Period  Weeks    Status  New            Plan - 04/04/17 1737    Clinical Impression Statement  Pt was reassessed this session having met all of her short goals and several of her long term goals from the start of therapy. She demonstrates improved LE strength on the Lt and Rt, limited primarily due to a recent adductor strain which is improving. She also reports an overall improvement of 50% with more good days than bad, in addition to pain free active lumbar ROM. She does continue to demonstrate limitations in spinal mobility, and impaired mobility of the hip and knee Rt>Lt, which is contributing to poor mechanics with sit to stand and limiting her stability in single leg stance. Due to her desire to ride horses, she would benefit from continued skilled PT to address her remaining limitations in mobility, stength and ROM, in order to prevent worsening of her RLE symptoms and LBP.      Clinical Presentation  Stable    Clinical Presentation due to:  pain is gradually improving     Clinical Decision Making  Low     Rehab Potential  Good    PT Frequency  2x / week    PT Duration  6 weeks    PT Treatment/Interventions  ADLs/Self Care Home Management;Electrical Stimulation;Moist Heat;Traction;Balance training;Therapeutic exercise;Therapeutic activities;Functional mobility training;Neuromuscular re-education;Patient/family education;Passive range of motion;Manual techniques;Dry needling;Taping    PT Next Visit Plan  tibial IR (self mobilization); hip ROM, gentle hip strengthening and trunk stability ;  f/u on self massage    Consulted and Agree with Plan of Care  Patient       Patient will benefit from skilled therapeutic intervention in order to improve the following deficits and impairments:  Decreased balance, Impaired flexibility, Hypomobility, Decreased strength, Decreased endurance, Decreased activity tolerance, Decreased mobility, Increased muscle spasms, Postural dysfunction, Pain, Improper body mechanics  Visit Diagnosis: Chronic right-sided low back pain, with sciatica presence unspecified  Muscle weakness (generalized)  Other muscle spasm   G-Codes - April 23, 2017 1803    Functional Assessment Tool Used (Outpatient Only)  FOTO: 41% limited     Functional Limitation  Mobility: Walking and moving around    Mobility: Walking and Moving Around Current Status [phone])  At least 40 percent but less than 60 percent impaired, limited or restricted    Mobility: Walking and Moving Around Goal Status [phone])  At least 20 percent but less than 40 percent impaired, limited or restricted       Problem List Patient Active Problem List   Diagnosis Date Noted  . 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 Breast cancer of upper-outer quadrant of left female breast ([place]) 12/07/2014  . Acute recurrent sinusitis 01/17/2013    6:05 PM,April 23, 2017 Elly Modena PT, DPT [facility] [address], [place], [place], [address] Phone: [phone]   Fax:  [phone]  Name: Christina [name] MRN: [id number] Date of Birth: 16-Dec-1950

## 2017-04-11 ENCOUNTER — Ambulatory Visit: Payer: Medicare Other | Admitting: Physical Therapy

## 2017-04-11 DIAGNOSIS — G8929 Other chronic pain: Secondary | ICD-10-CM | POA: Diagnosis not present

## 2017-04-11 DIAGNOSIS — M545 Low back pain: Principal | ICD-10-CM

## 2017-04-11 DIAGNOSIS — M62838 Other muscle spasm: Secondary | ICD-10-CM | POA: Diagnosis not present

## 2017-04-11 DIAGNOSIS — M6281 Muscle weakness (generalized): Secondary | ICD-10-CM

## 2017-04-11 NOTE — Patient Instructions (Signed)
  Sidelying Thoracic Rotation  Lying on side, hips and knees at 90 degrees.  Rotate top hand and head backwards until stretch is felt in mid back.  10x each side.       Standing tibial IR with foot in chair.  Knee on unaffected knee and place hands around affected knee with thumbs behind knee. Gently rotate knee inward as you lean forward bending knee.  Exercise should be pain free.    3x10 reps     Hip Extension  Standing tall and keeping knee straight, extend one leg back without leaning forward or arching your back. Return to the starting position. Repeat as prescribed. Hold 3 sec, repeat 10x each leg.     WALL SQUATS  Leaning up against a wall or closed door on your back, slide your body downward and then return back to upright position.  A door was used here because it was smoother and had less friction than the wall.   Knees should bend in line with the 2nd toe and not pass the front of the foot.  Yellow band around knees to encourage knee in line with 3rd toe.   x10 reps    [facility] [address], [place], [place] [address] Phone # [phone] Fax [phone]

## 2017-04-11 NOTE — Therapy (Signed)
[facility] [address], [place], [place], [address] Phone: [phone]   Fax:  [phone]  Physical Therapy Treatment  Patient Details  Name: Christina [name] MRN: [id number] Date of Birth: 01/19/1951 Referring Provider: Hulan Saas, DO    Encounter Date: 04/11/2017  PT End of Session - 04/11/17 1526    Visit Number  12    Number of Visits  13    Date for PT Re-Evaluation  05/16/17    Authorization Type  Medicare A and B (G-codes completed on 06-20-22 visit)    Authorization Time Period  04/05/17 to 05/16/17    PT Start Time  1648    PT Stop Time  1730    PT Time Calculation (min)  42 min    Activity Tolerance  Patient tolerated treatment well;No increased pain    Behavior During Therapy  WFL for tasks assessed/performed       Past Medical History:  Diagnosis Date  . Abrasion of right leg 12/17/2014  . Adrenal insufficiency ([place])   . Breast cancer of upper-outer quadrant of left female breast ([place]) 12/07/2014  . Cataract, immature    bilateral  . Dental bridge present    upper - x 2  . Dental crowns present   . Hypothyroidism   . Prediabetes     Past Surgical History:  Procedure Laterality Date  . ABDOMINOPLASTY    . BALLOON SINUPLASTY    . BREAST LUMPECTOMY WITH RADIOACTIVE SEED AND SENTINEL LYMPH NODE BIOPSY Left 12/22/2014   Procedure: BREAST LUMPECTOMY WITH RADIOACTIVE SEED AND SENTINEL LYMPH NODE BIOPSY;  Surgeon: Stark Klein, MD;  Location: [place];  Service: General;  Laterality: Left;  . BUNIONECTOMY Right   . HAMMER TOE SURGERY Bilateral   . RHINOPLASTY    . SEPTOPLASTY      There were no vitals filed for this visit.  Subjective Assessment - 04/11/17 1451    Subjective  Pt reports that her Rt buttock hurts her and her Rt leg is tired from all of the snow.     Limitations  House hold activities;Walking    How long can you walk comfortably?  unable to power walk     Patient  Stated Goals  improve her ability to complete exercise and horse riding without significant pain (currently only riding 2x/week).     Currently in Pain?  Yes    Pain Score  2     Pain Location  -- buttock and Rt thigh     Pain Orientation  Right    Pain Descriptors / Indicators  Aching    Pain Type  Chronic pain    Pain Radiating Towards  none     Pain Onset  More than a month ago    Pain Frequency  Intermittent    Aggravating Factors   walking in the snow     Pain Relieving Factors  tennis ball     Effect of Pain on Daily Activities  limited activity participation                       [facility] Adult PT Treatment/Exercise - 04/11/17 0001      Lumbar Exercises: Standing   Other Standing Lumbar Exercises  wall squat with yellow TB around knees x10 reps       Lumbar Exercises: Supine   Other Supine Lumbar Exercises  hip extension isometric hold x3 sec, 10x each LE  Lumbar Exercises: Sidelying   Other Sidelying Lumbar Exercises  sidelying thoracic rotation x15 reps each       Manual Therapy   Joint Mobilization  Grade III/IV CPAs lumbar spine; Rt tibial IR MWM 3x10 reps (pt completing last set for HEP)              PT Education - 04/11/17 1526    Education provided  Yes    Education Details  technique adjustments to HEP and stretches performed at home.     Person(s) Educated  Patient    Methods  Explanation;Verbal cues;Tactile cues;Handout    Comprehension  Verbalized understanding;Returned demonstration       PT Short Term Goals - 04/04/17 1641      PT SHORT TERM GOAL #1   Title  Pt will demo consistency and independence with her HEP to decrease pain and improve BLE strength.     Time  3    Period  Weeks    Status  Achieved      PT SHORT TERM GOAL #2   Title  Pt will report atleast 25% improvement in Rt buttock pain to allow for return to daily brisk walking routine without significant increase in symptoms.     Baseline  50% improved     Time  3     Period  Weeks    Status  Achieved      PT SHORT TERM GOAL #3   Title  Pt will demonstrate Rt tibial inversion to atleast 20 degrees to improve her mechanics with squatting and other closed chain activity.     Time  3    Period  Weeks    Status  New    Target Date  04/25/17        PT Long Term Goals - 04/04/17 1642      PT LONG TERM GOAL #1   Title  Pt will demo improved BLE strength to 5/5 MMT which will increase her safety with daily activity.     Baseline  up to 4/5 MMT     Time  6    Period  Weeks    Status  Partially Met    Target Date  05/16/17      PT LONG TERM GOAL #2   Title  Pt will report being able to return to horse riding atleast 3x/week without difficulty to improve her quality of life and participation in activity with her peers.     Baseline  1x/week is ideal in the winter time     Time  6    Period  Weeks    Status  Deferred      PT LONG TERM GOAL #3   Title  Pt will report atleast 75% improvement in her symptoms to allow for her to resume all prior activities without difficulty.     Baseline  50% improved     Time  6    Period  Weeks    Status  Partially Met      PT LONG TERM GOAL #4   Title  Pt will demo improved hip stability evident by her ability to maintain single leg balance without LOB or (+) trendelenburg deviation for atleast 10 sec, 2/3 trials.     Time  6    Period  Weeks    Status  Revised      PT LONG TERM GOAL #5   Title  Pt will demo proper BLE squat technique without Rt knee  valgus deviation x5 reps.     Time  6    Period  Weeks    Status  New            Plan - 04/11/17 1531    Clinical Impression Statement  Pt arrived today with increased soreness in her Rt glute and lateral thigh with unsteadiness on the snow. Session focused on limitations noted at her recent reassessment, including thoracic and lumbar mobility. Pt does demonstrate increased restrictions with Rt thoracic rotation compared to the Lt and her HEP was  updated this session to address this. Pt demonstrated good understanding of HEP updates and reported no increase in pain with exercises performed.     Rehab Potential  Good    PT Frequency  2x / week    PT Duration  6 weeks    PT Treatment/Interventions  ADLs/Self Care Home Management;Electrical Stimulation;Moist Heat;Traction;Balance training;Therapeutic exercise;Therapeutic activities;Functional mobility training;Neuromuscular re-education;Patient/family education;Passive range of motion;Manual techniques;Dry needling;Taping    PT Next Visit Plan  follow up on HEP additions; hip ROM, gentle hip strengthening and trunk stability ; f/u on self massage    PT Home Exercise Plan  thoracic rotation, tibial IR mobilization, supine hip extension isometric, wall squat    Consulted and Agree with Plan of Care  Patient       Patient will benefit from skilled therapeutic intervention in order to improve the following deficits and impairments:  Decreased balance, Impaired flexibility, Hypomobility, Decreased strength, Decreased endurance, Decreased activity tolerance, Decreased mobility, Increased muscle spasms, Postural dysfunction, Pain, Improper body mechanics  Visit Diagnosis: Chronic right-sided low back pain, with sciatica presence unspecified  Muscle weakness (generalized)  Other muscle spasm     Problem List Patient Active Problem List   Diagnosis Date Noted  . 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 Breast cancer of upper-outer quadrant of left female breast ([place]) 12/07/2014  . Acute recurrent sinusitis 01/17/2013    3:34 PM,04/11/17 Elly Modena PT, DPT [facility] [address] [place], [place], [address] Phone: [phone]   Fax:  [phone]  Name: Christina [name] MRN: [id number] Date of Birth: [birth year]

## 2017-04-13 ENCOUNTER — Ambulatory Visit: Payer: Medicare Other | Admitting: Physical Therapy

## 2017-04-13 DIAGNOSIS — M6281 Muscle weakness (generalized): Secondary | ICD-10-CM | POA: Diagnosis not present

## 2017-04-13 DIAGNOSIS — G8929 Other chronic pain: Secondary | ICD-10-CM

## 2017-04-13 DIAGNOSIS — M62838 Other muscle spasm: Secondary | ICD-10-CM

## 2017-04-13 DIAGNOSIS — M545 Low back pain: Secondary | ICD-10-CM | POA: Diagnosis not present

## 2017-04-13 NOTE — Therapy (Signed)
[facility] [address], [place], [place], [address] Phone: [phone]   Fax:  [phone]  Physical Therapy Treatment  Patient Details  Name: Christina [name] MRN: [id number] Date of Birth: July 12, 1950 Referring Provider: Hulan Saas, DO    Encounter Date: 04/13/2017  PT End of Session - 04/13/17 1151    Visit Number  13    Date for PT Re-Evaluation  05/16/17    Authorization Type  Medicare A and B (G-codes completed on July 08, 2022 visit)    Authorization Time Period  04/05/17 to 05/16/17    PT Start Time  1101    PT Stop Time  1144    PT Time Calculation (min)  43 min    Activity Tolerance  Patient tolerated treatment well;No increased pain    Behavior During Therapy  WFL for tasks assessed/performed       Past Medical History:  Diagnosis Date  . Abrasion of right leg 12/17/2014  . Adrenal insufficiency ([place])   . Breast cancer of upper-outer quadrant of left female breast ([place]) 12/07/2014  . Cataract, immature    bilateral  . Dental bridge present    upper - x 2  . Dental crowns present   . Hypothyroidism   . Prediabetes     Past Surgical History:  Procedure Laterality Date  . ABDOMINOPLASTY    . BALLOON SINUPLASTY    . BREAST LUMPECTOMY WITH RADIOACTIVE SEED AND SENTINEL LYMPH NODE BIOPSY Left 12/22/2014   Procedure: BREAST LUMPECTOMY WITH RADIOACTIVE SEED AND SENTINEL LYMPH NODE BIOPSY;  Surgeon: Stark Klein, MD;  Location: [place];  Service: General;  Laterality: Left;  . BUNIONECTOMY Right   . HAMMER TOE SURGERY Bilateral   . RHINOPLASTY    . SEPTOPLASTY      There were no vitals filed for this visit.  Subjective Assessment - 04/13/17 1103    Subjective  Pt reports that she is a little sore on her Rt groin today. No pain in her low back or buttock area.     Limitations  House hold activities;Walking    How long can you walk comfortably?  unable to power walk     Patient Stated  Goals  improve her ability to complete exercise and horse riding without significant pain (currently only riding 2x/week).     Currently in Pain?  Yes    Pain Score  3     Pain Location  Groin    Pain Orientation  Right    Pain Descriptors / Indicators  Aching    Pain Type  Chronic pain    Pain Radiating Towards  none     Pain Onset  More than a month ago    Pain Frequency  Intermittent    Aggravating Factors   unsure     Pain Relieving Factors  tennis ball, sleeping with pillow between the knees     Effect of Pain on Daily Activities  issues sleeping                       [facility] Adult PT Treatment/Exercise - 04/13/17 0001      Exercises   Exercises  Other Exercises    Other Exercises   Half kneel: Rt tibial IR mobilization x10 reps       Lumbar Exercises: Seated   Other Seated Lumbar Exercises  sit to stand with yellow TB 2x10 reps      Lumbar Exercises: Supine  Other Supine Lumbar Exercises  hip abduction slide 2x5 reps each       Lumbar Exercises: Sidelying   Other Sidelying Lumbar Exercises  sidelying thoracic rotation x10 reps to the Rt for technique improvement       Knee/Hip Exercises: Stretches   Gastroc Stretch  2 reps;30 seconds;Right;Other (comment) against wall       Manual Therapy   Joint Mobilization  Rt hip IR MWM 2x10 reps; Grade IV Rt talocrural AP mobilization              PT Education - 04/13/17 1151    Education provided  Yes    Education Details  HEP technique and answered questions    Person(s) Educated  Patient    Methods  Explanation    Comprehension  Verbalized understanding;Returned demonstration       PT Short Term Goals - 04/04/17 1641      PT SHORT TERM GOAL #1   Title  Pt will demo consistency and independence with her HEP to decrease pain and improve BLE strength.     Time  3    Period  Weeks    Status  Achieved      PT SHORT TERM GOAL #2   Title  Pt will report atleast 25% improvement in Rt buttock pain to  allow for return to daily brisk walking routine without significant increase in symptoms.     Baseline  50% improved     Time  3    Period  Weeks    Status  Achieved      PT SHORT TERM GOAL #3   Title  Pt will demonstrate Rt tibial inversion to atleast 20 degrees to improve her mechanics with squatting and other closed chain activity.     Time  3    Period  Weeks    Status  New    Target Date  04/25/17        PT Long Term Goals - 04/04/17 1642      PT LONG TERM GOAL #1   Title  Pt will demo improved BLE strength to 5/5 MMT which will increase her safety with daily activity.     Baseline  up to 4/5 MMT     Time  6    Period  Weeks    Status  Partially Met    Target Date  05/16/17      PT LONG TERM GOAL #2   Title  Pt will report being able to return to horse riding atleast 3x/week without difficulty to improve her quality of life and participation in activity with her peers.     Baseline  1x/week is ideal in the winter time     Time  6    Period  Weeks    Status  Deferred      PT LONG TERM GOAL #3   Title  Pt will report atleast 75% improvement in her symptoms to allow for her to resume all prior activities without difficulty.     Baseline  50% improved     Time  6    Period  Weeks    Status  Partially Met      PT LONG TERM GOAL #4   Title  Pt will demo improved hip stability evident by her ability to maintain single leg balance without LOB or (+) trendelenburg deviation for atleast 10 sec, 2/3 trials.     Time  6    Period  Weeks  Status  Revised      PT LONG TERM GOAL #5   Title  Pt will demo proper BLE squat technique without Rt knee valgus deviation x5 reps.     Time  6    Period  Weeks    Status  New            Plan - 04/13/17 1155    Clinical Impression Statement  Pt reporting new HEP adherence following her last session. Therapist was able to answer questions and make adjustments per pt's request. Pt demonstrated improved technique with sit to stand  this session, with minimal knee valgus deviation on the Rt as long as theraband was provided for external cuing. She does demonstrate restrictions in Rt ankle mobility worse than on the Lt which was addressed with manual techniques and review of home stretch. Pt reported no increase in pain with today's activity and verbalized good understanding of HEP adjustments.     Rehab Potential  Good    PT Frequency  2x / week    PT Duration  6 weeks    PT Treatment/Interventions  ADLs/Self Care Home Management;Electrical Stimulation;Moist Heat;Traction;Balance training;Therapeutic exercise;Therapeutic activities;Functional mobility training;Neuromuscular re-education;Patient/family education;Passive range of motion;Manual techniques;Dry needling;Taping    PT Next Visit Plan  ankle DF ROM (MWM), hip ROM, gentle hip strengthening and trunk stability     PT Home Exercise Plan  thoracic rotation, tibial IR mobilization, supine hip extension isometric, wall squat    Consulted and Agree with Plan of Care  Patient       Patient will benefit from skilled therapeutic intervention in order to improve the following deficits and impairments:  Decreased balance, Impaired flexibility, Hypomobility, Decreased strength, Decreased endurance, Decreased activity tolerance, Decreased mobility, Increased muscle spasms, Postural dysfunction, Pain, Improper body mechanics  Visit Diagnosis: Muscle weakness (generalized)  Chronic right-sided low back pain, with sciatica presence unspecified  Other muscle spasm     Problem List Patient Active Problem List   Diagnosis Date Noted  . 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 Breast cancer of upper-outer quadrant of left female breast ([place]) 12/07/2014  . Acute recurrent sinusitis 01/17/2013    12:00 PM,04/13/17 Elly Modena PT, DPT [place] [facility] [address], [place], [place], [address] Phone: [phone]   Fax:  [phone]  Name: Christina [name] MRN: [id number] Date of Birth: 1950/09/19

## 2017-04-18 ENCOUNTER — Ambulatory Visit: Payer: Medicare Other | Admitting: Physical Therapy

## 2017-04-18 DIAGNOSIS — M62838 Other muscle spasm: Secondary | ICD-10-CM | POA: Diagnosis not present

## 2017-04-18 DIAGNOSIS — G8929 Other chronic pain: Secondary | ICD-10-CM

## 2017-04-18 DIAGNOSIS — M6281 Muscle weakness (generalized): Secondary | ICD-10-CM | POA: Diagnosis not present

## 2017-04-18 DIAGNOSIS — M545 Low back pain: Secondary | ICD-10-CM | POA: Diagnosis not present

## 2017-04-18 NOTE — Therapy (Signed)
[facility] [address], [place], [place], [address] Phone: [phone]   Fax:  [phone]  Physical Therapy Treatment  Patient Details  Name: Christina [name] MRN: [id number] Date of Birth: 09-Apr-1951 Referring Provider: Hulan Saas, DO    Encounter Date: 04/18/2017  PT End of Session - 04/18/17 0927    Visit Number  14    Date for PT Re-Evaluation  05/16/17    Authorization Type  Medicare A and B (G-codes completed on 06-27-22 visit)    Authorization Time Period  04/05/17 to 05/16/17    PT Start Time  0845    PT Stop Time  0926    PT Time Calculation (min)  41 min    Activity Tolerance  Patient tolerated treatment well;No increased pain    Behavior During Therapy  WFL for tasks assessed/performed       Past Medical History:  Diagnosis Date  . Abrasion of right leg 12/17/2014  . Adrenal insufficiency ([place])   . Breast cancer of upper-outer quadrant of left female breast ([place]) 12/07/2014  . Cataract, immature    bilateral  . Dental bridge present    upper - x 2  . Dental crowns present   . Hypothyroidism   . Prediabetes     Past Surgical History:  Procedure Laterality Date  . ABDOMINOPLASTY    . BALLOON SINUPLASTY    . BREAST LUMPECTOMY WITH RADIOACTIVE SEED AND SENTINEL LYMPH NODE BIOPSY Left 12/22/2014   Procedure: BREAST LUMPECTOMY WITH RADIOACTIVE SEED AND SENTINEL LYMPH NODE BIOPSY;  Surgeon: Stark Klein, MD;  Location: [place];  Service: General;  Laterality: Left;  . BUNIONECTOMY Right   . HAMMER TOE SURGERY Bilateral   . RHINOPLASTY    . SEPTOPLASTY      There were no vitals filed for this visit.  Subjective Assessment - 04/18/17 0854    Subjective  Pt reports things are going well today. She was able to ride yesterday for 40 min without any difficulty. She has some questions about her exercises.     Limitations  House hold activities;Walking    How long can you walk  comfortably?  unable to power walk     Patient Stated Goals  improve her ability to complete exercise and horse riding without significant pain (currently only riding 2x/week).     Currently in Pain?  No/denies    Pain Onset  More than a month ago               [facility] Adult PT Treatment/Exercise - 04/18/17 0001      Lumbar Exercises: Seated   Other Seated Lumbar Exercises  sit to stand with yellow TB 2x10 reps      Lumbar Exercises: Supine   Other Supine Lumbar Exercises  Rt bent knee fallout stretch 5x20 sec       Knee/Hip Exercises: Stretches   Gastroc Stretch  3 reps;30 seconds;Other (comment);Both standing       Knee/Hip Exercises: Aerobic   Nustep  L1 x31mn Pt present for HEP adjustments and answer pt questions.       Knee/Hip Exercises: Standing   Other Standing Knee Exercises  active closed chain DF facing wall x10 reps BLE      Manual Therapy   Joint Mobilization  Rt ankle DF MWM 3x10 reps; grade IV Rt talocrural AP mobilization  closed chain DF 20 deg increased to 28 deg end of session  PT Education - 04/18/17 0926    Education provided  Yes    Education Details  HEP adjustments    Person(s) Educated  Patient    Methods  Handout;Explanation    Comprehension  Returned demonstration;Verbalized understanding       PT Short Term Goals - 04/04/17 1641      PT SHORT TERM GOAL #1   Title  Pt will demo consistency and independence with her HEP to decrease pain and improve BLE strength.     Time  3    Period  Weeks    Status  Achieved      PT SHORT TERM GOAL #2   Title  Pt will report atleast 25% improvement in Rt buttock pain to allow for return to daily brisk walking routine without significant increase in symptoms.     Baseline  50% improved     Time  3    Period  Weeks    Status  Achieved      PT SHORT TERM GOAL #3   Title  Pt will demonstrate Rt tibial inversion to atleast 20 degrees to improve her mechanics with squatting and other  closed chain activity.     Time  3    Period  Weeks    Status  New    Target Date  04/25/17        PT Long Term Goals - 04/04/17 1642      PT LONG TERM GOAL #1   Title  Pt will demo improved BLE strength to 5/5 MMT which will increase her safety with daily activity.     Baseline  up to 4/5 MMT     Time  6    Period  Weeks    Status  Partially Met    Target Date  05/16/17      PT LONG TERM GOAL #2   Title  Pt will report being able to return to horse riding atleast 3x/week without difficulty to improve her quality of life and participation in activity with her peers.     Baseline  1x/week is ideal in the winter time     Time  6    Period  Weeks    Status  Deferred      PT LONG TERM GOAL #3   Title  Pt will report atleast 75% improvement in her symptoms to allow for her to resume all prior activities without difficulty.     Baseline  50% improved     Time  6    Period  Weeks    Status  Partially Met      PT LONG TERM GOAL #4   Title  Pt will demo improved hip stability evident by her ability to maintain single leg balance without LOB or (+) trendelenburg deviation for atleast 10 sec, 2/3 trials.     Time  6    Period  Weeks    Status  Revised      PT LONG TERM GOAL #5   Title  Pt will demo proper BLE squat technique without Rt knee valgus deviation x5 reps.     Time  6    Period  Weeks    Status  New            Plan - 04/18/17 8416    Clinical Impression Statement  Pt arrived today without any complaints, noting she was able to ride for 40 min yesterday without any difficulty. Session focused on improving ankle  mobility with manual techniques, noting increase of closed chain DF up to 28 deg end of session. Pt was able to complete all exercises today without any reports of increase in pain. She demonstrated good understanding of all HEP additions this session.     Rehab Potential  Good    PT Frequency  2x / week    PT Duration  6 weeks    PT  Treatment/Interventions  ADLs/Self Care Home Management;Electrical Stimulation;Moist Heat;Traction;Balance training;Therapeutic exercise;Therapeutic activities;Functional mobility training;Neuromuscular re-education;Patient/family education;Passive range of motion;Manual techniques;Dry needling;Taping    PT Next Visit Plan  ankle DF ROM measurement, hip ROM, gentle hip strengthening and trunk stabilization    PT Home Exercise Plan  thoracic rotation, tibial IR mobilization, supine hip extension isometric, sit to stand, ankle stretch     Consulted and Agree with Plan of Care  Patient       Patient will benefit from skilled therapeutic intervention in order to improve the following deficits and impairments:  Decreased balance, Impaired flexibility, Hypomobility, Decreased strength, Decreased endurance, Decreased activity tolerance, Decreased mobility, Increased muscle spasms, Postural dysfunction, Pain, Improper body mechanics  Visit Diagnosis: Muscle weakness (generalized)  Chronic right-sided low back pain, with sciatica presence unspecified  Other muscle spasm     Problem List Patient Active Problem List   Diagnosis Date Noted  . 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 Breast cancer of upper-outer quadrant of left female breast ([place]) 12/07/2014  . Acute recurrent sinusitis 01/17/2013    9:59 AM,04/18/17 Elly Modena PT, DPT [place] [facility] [address] [place], [place], [address] Phone: [phone]   Fax:  [phone]  Name: Christina [name] MRN: [id number] Date of Birth: 28-Feb-1951

## 2017-04-18 NOTE — Patient Instructions (Addendum)
   Yellow band around knees. Keep knees apart, weight shifted onto the right leg.  2x15 reps.      STANDING CALF STRETCH  - GASTROCNEMIUS  Start by standing in front of a wall or other sturdy object. Step forward with one foot and maintain your toes on both feet to be pointed straight forward. Keep the leg behind you with a straight knee during the stretch.   Lean forward towards the wall and support yourself with your arms as you allow your front knee to bend until a gentle stretch is felt along the back of your leg that is most behind you.   Move closer or further away from the wall to control the stretch of the back leg. Also you can adjust the bend of the front knee to control the stretch as well.  Hold 30 sec, repeat 3x.   [facility] [address] [place], [place] [address] Phone # [phone] Fax [phone]

## 2017-04-20 ENCOUNTER — Ambulatory Visit: Payer: Medicare Other | Admitting: Physical Therapy

## 2017-04-20 DIAGNOSIS — M6281 Muscle weakness (generalized): Secondary | ICD-10-CM

## 2017-04-20 DIAGNOSIS — M62838 Other muscle spasm: Secondary | ICD-10-CM

## 2017-04-20 DIAGNOSIS — M545 Low back pain: Secondary | ICD-10-CM | POA: Diagnosis not present

## 2017-04-20 DIAGNOSIS — G8929 Other chronic pain: Secondary | ICD-10-CM

## 2017-04-20 NOTE — Therapy (Signed)
[facility] [address] [place], [place], [address] Phone: [phone]   Fax:  [phone]  Physical Therapy Treatment  Patient Details  Name: Christina [name] MRN: [id number] Date of Birth: [birth year] Referring Provider: Hulan Saas, DO    Encounter Date: 04/20/2017  PT End of Session - 04/20/17 0941    Visit Number  15    Date for PT Re-Evaluation  05/16/17    Authorization Type  Medicare A and B (G-codes completed on 06-26-2022 visit)    Authorization Time Period  04/05/17 to 05/16/17    PT Start Time  0929    PT Stop Time  1009    PT Time Calculation (min)  40 min    Activity Tolerance  Patient tolerated treatment well;No increased pain    Behavior During Therapy  WFL for tasks assessed/performed       Past Medical History:  Diagnosis Date  . Abrasion of right leg 12/17/2014  . Adrenal insufficiency ([place])   . Breast cancer of upper-outer quadrant of left female breast ([place]) 12/07/2014  . Cataract, immature    bilateral  . Dental bridge present    upper - x 2  . Dental crowns present   . Hypothyroidism   . Prediabetes     Past Surgical History:  Procedure Laterality Date  . ABDOMINOPLASTY    . BALLOON SINUPLASTY    . BREAST LUMPECTOMY WITH RADIOACTIVE SEED AND SENTINEL LYMPH NODE BIOPSY Left 12/22/2014   Procedure: BREAST LUMPECTOMY WITH RADIOACTIVE SEED AND SENTINEL LYMPH NODE BIOPSY;  Surgeon: Stark Klein, MD;  Location: [facility];  Service: General;  Laterality: Left;  . BUNIONECTOMY Right   . HAMMER TOE SURGERY Bilateral   . RHINOPLASTY    . SEPTOPLASTY      There were no vitals filed for this visit.  Subjective Assessment - 04/20/17 0933    Subjective  Pt reports that things are going well. She did some of her exercises, but not all of them since her last session. She woke up with some pain this morning, but it is doing good now.     Limitations  House hold activities;Walking     How long can you walk comfortably?  unable to power walk     Patient Stated Goals  improve her ability to complete exercise and horse riding without significant pain (currently only riding 2x/week).     Currently in Pain?  No/denies    Pain Onset  More than a month ago                      [facility] Adult PT Treatment/Exercise - 04/20/17 0001      Lumbar Exercises: Stretches   Piriformis Stretch  2 reps;30 seconds      Lumbar Exercises: Standing   Other Standing Lumbar Exercises  BUE pressdown with yellow TB and alternating single leg stance 2x5 reps each       Lumbar Exercises: Supine   Other Supine Lumbar Exercises  Sciatic nerve flossing 2x15 reps on Rt, 1x15 reps on Lt      Knee/Hip Exercises: Stretches   Piriformis Stretch  2 reps;30 seconds;Both;Other (comment) seated     Gastroc Stretch  2 reps;20 seconds;Right;Other (comment) standing against wall     Soleus Stretch  2 reps;30 seconds;Right;Other (comment) standing against wall       Knee/Hip Exercises: Aerobic   Nustep  L2 x5 min PT present to discuss session  Knee/Hip Exercises: Seated   Clamshell with TheraBand  Yellow single leg 3x5 reps each    Other Seated Knee/Hip Exercises  ankle DF/PF with yellow TB around knees and using balance board 2x10 reps              PT Education - 04/20/17 1010    Education provided  Yes    Education Details  technique with therex     Person(s) Educated  Patient    Methods  Explanation;Verbal cues    Comprehension  Verbalized understanding;Returned demonstration       PT Short Term Goals - 04/20/17 1014      PT SHORT TERM GOAL #1   Title  Pt will demo consistency and independence with her HEP to decrease pain and improve BLE strength.     Time  3    Period  Weeks    Status  Achieved      PT SHORT TERM GOAL #2   Title  Pt will report atleast 25% improvement in Rt buttock pain to allow for return to daily brisk walking routine without significant increase  in symptoms.     Baseline  50% improved     Time  3    Period  Weeks    Status  Achieved      PT SHORT TERM GOAL #3   Title  Pt will demonstrate Rt tibial inversion to atleast 20 degrees to improve her mechanics with squatting and other closed chain activity.     Time  3    Period  Weeks    Status  On-going        PT Long Term Goals - 04/04/17 1642      PT LONG TERM GOAL #1   Title  Pt will demo improved BLE strength to 5/5 MMT which will increase her safety with daily activity.     Baseline  up to 4/5 MMT     Time  6    Period  Weeks    Status  Partially Met    Target Date  05/16/17      PT LONG TERM GOAL #2   Title  Pt will report being able to return to horse riding atleast 3x/week without difficulty to improve her quality of life and participation in activity with her peers.     Baseline  1x/week is ideal in the winter time     Time  6    Period  Weeks    Status  Deferred      PT LONG TERM GOAL #3   Title  Pt will report atleast 75% improvement in her symptoms to allow for her to resume all prior activities without difficulty.     Baseline  50% improved     Time  6    Period  Weeks    Status  Partially Met      PT LONG TERM GOAL #4   Title  Pt will demo improved hip stability evident by her ability to maintain single leg balance without LOB or (+) trendelenburg deviation for atleast 10 sec, 2/3 trials.     Time  6    Period  Weeks    Status  Revised      PT LONG TERM GOAL #5   Title  Pt will demo proper BLE squat technique without Rt knee valgus deviation x5 reps.     Time  6    Period  Weeks    Status  New  Plan - 04/20/17 1011    Clinical Impression Statement  Pt arrived today without report of pain, overall feeling that she is doing well. Session continued with therex to improve ankle DF mobility as well as trunk and hip stability. Added standing stability exercise this session, pt requiring minor adjustments to improve mechanics. Overall,  she did not report any significant muscle fatigue or increase in pain during the session. Will continue with current POC.     Rehab Potential  Good    PT Frequency  2x / week    PT Duration  6 weeks    PT Treatment/Interventions  ADLs/Self Care Home Management;Electrical Stimulation;Moist Heat;Traction;Balance training;Therapeutic exercise;Therapeutic activities;Functional mobility training;Neuromuscular re-education;Patient/family education;Passive range of motion;Manual techniques;Dry needling;Taping    PT Next Visit Plan  gentle hip strengthening and trunk stabilization progressions     PT Home Exercise Plan  thoracic rotation, tibial IR mobilization, supine hip extension isometric, sit to stand, ankle stretch     Consulted and Agree with Plan of Care  Patient       Patient will benefit from skilled therapeutic intervention in order to improve the following deficits and impairments:  Decreased balance, Impaired flexibility, Hypomobility, Decreased strength, Decreased endurance, Decreased activity tolerance, Decreased mobility, Increased muscle spasms, Postural dysfunction, Pain, Improper body mechanics  Visit Diagnosis: Muscle weakness (generalized)  Chronic right-sided low back pain, with sciatica presence unspecified  Other muscle spasm     Problem List Patient Active Problem List   Diagnosis Date Noted  . 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 Breast cancer of upper-outer quadrant of left female breast ([place]) 12/07/2014  . Acute recurrent sinusitis 01/17/2013    10:23 AM,04/20/17 Elly Modena PT, DPT [place] [facility] [address] [place], [place], [address] Phone: [phone]   Fax:  [phone]  Name: ROSALENA MCCORRY MRN: [id number] Date of Birth: 12-Apr-1951

## 2017-04-25 ENCOUNTER — Ambulatory Visit: Payer: Medicare Other | Admitting: Physical Therapy

## 2017-04-25 DIAGNOSIS — M6281 Muscle weakness (generalized): Secondary | ICD-10-CM | POA: Diagnosis not present

## 2017-04-25 DIAGNOSIS — M62838 Other muscle spasm: Secondary | ICD-10-CM | POA: Diagnosis not present

## 2017-04-25 DIAGNOSIS — M545 Low back pain: Secondary | ICD-10-CM | POA: Diagnosis not present

## 2017-04-25 DIAGNOSIS — G8929 Other chronic pain: Secondary | ICD-10-CM | POA: Diagnosis not present

## 2017-04-25 NOTE — Therapy (Signed)
[facility] [address], [place], [place], [address] Phone: [phone]   Fax:  [phone]  Physical Therapy Treatment  Patient Details  Name: Christina [name] MRN: [id number] Date of Birth: 02-22-1951 Referring Provider: Hulan Saas, DO   Encounter Date: 04/25/2017  PT End of Session - 04/25/17 0935    Visit Number  16    Date for PT Re-Evaluation  05/16/17    Authorization Type  Medicare A and B (G-codes completed on 08-02-22 visit)    PT Start Time  0930    PT Stop Time  1019    PT Time Calculation (min)  49 min       Past Medical History:  Diagnosis Date  . Abrasion of right leg 12/17/2014  . Adrenal insufficiency ([place])   . Breast cancer of upper-outer quadrant of left female breast ([place]) 12/07/2014  . Cataract, immature    bilateral  . Dental bridge present    upper - x 2  . Dental crowns present   . Hypothyroidism   . Prediabetes     Past Surgical History:  Procedure Laterality Date  . ABDOMINOPLASTY    . BALLOON SINUPLASTY    . BREAST LUMPECTOMY WITH RADIOACTIVE SEED AND SENTINEL LYMPH NODE BIOPSY Left 12/22/2014   Procedure: BREAST LUMPECTOMY WITH RADIOACTIVE SEED AND SENTINEL LYMPH NODE BIOPSY;  Surgeon: Stark Klein, MD;  Location: [facility];  Service: General;  Laterality: Left;  . BUNIONECTOMY Right   . HAMMER TOE SURGERY Bilateral   . RHINOPLASTY    . SEPTOPLASTY      There were no vitals filed for this visit.  Subjective Assessment - 04/25/17 0936    Subjective  Pt reports she had pain when she woke up this morning (4/10), but it resolved after she walked her dog. she continues to wake in the night due to buttock pain.  She feels like things are going well with therapy; 50% improvement in symptoms since iniating therapy.     Currently in Pain?  No/denies    Pain Score  0-No pain         [facility] PT Assessment - 04/25/17 0001      Assessment   Medical Diagnosis  Lumbar  radiculopathy     Referring Provider  Hulan Saas, DO    Onset Date/Surgical Date  -- ~3-6 months ago     Next MD Visit  05/2017        [facility] Adult PT Treatment/Exercise - 04/25/17 0001      Lumbar Exercises: Stretches   Hip Flexor Stretch  2 reps;30 seconds same side arm overhead; high kneeling with pad under knee.       Lumbar Exercises: Aerobic   Stationary Bike  NuStep L1: 5 min  PTA present to discuss progress      Lumbar Exercises: Standing   Other Standing Lumbar Exercises  BUE pressdown with yellow TB and alternating single leg stance 20 sec, 2 reps each side.      Other Standing Lumbar Exercises  SLS Rt/Lt up to 20 sec with horiz head turns. (challenging)      Lumbar Exercises: Sidelying   Other Sidelying Lumbar Exercises  sidelying thoracic rotation x 5 reps each side.       Knee/Hip Exercises: Stretches   Passive Hamstring Stretch  Right;Left;2 reps;30 seconds    Piriformis Stretch  2 reps;30 seconds;Both;Other (comment) seated     Gastroc Stretch  Right;Left;30 seconds;3 reps heel hanging  off of step    Soleus Stretch  2 reps;30 seconds;Right;Other (comment) standing against wall       Knee/Hip Exercises: Seated   Other Seated Knee/Hip Exercises  ankle DF/PF with using balance board 10 reps       Ankle Exercises: Seated   BAPS  Sitting;Level 3;10 reps 12/6, 3/9, CW/CCW               PT Short Term Goals - 04/20/17 1014      PT SHORT TERM GOAL #1   Title  Pt will demo consistency and independence with her HEP to decrease pain and improve BLE strength.     Time  3    Period  Weeks    Status  Achieved      PT SHORT TERM GOAL #2   Title  Pt will report atleast 25% improvement in Rt buttock pain to allow for return to daily brisk walking routine without significant increase in symptoms.     Baseline  50% improved     Time  3    Period  Weeks    Status  Achieved      PT SHORT TERM GOAL #3   Title  Pt will demonstrate Rt tibial inversion to atleast 20  degrees to improve her mechanics with squatting and other closed chain activity.     Time  3    Period  Weeks    Status  On-going        PT Long Term Goals - 04/04/17 1642      PT LONG TERM GOAL #1   Title  Pt will demo improved BLE strength to 5/5 MMT which will increase her safety with daily activity.     Baseline  up to 4/5 MMT     Time  6    Period  Weeks    Status  Partially Met    Target Date  05/16/17      PT LONG TERM GOAL #2   Title  Pt will report being able to return to horse riding atleast 3x/week without difficulty to improve her quality of life and participation in activity with her peers.     Baseline  1x/week is ideal in the winter time     Time  6    Period  Weeks    Status  Deferred      PT LONG TERM GOAL #3   Title  Pt will report atleast 75% improvement in her symptoms to allow for her to resume all prior activities without difficulty.     Baseline  50% improved     Time  6    Period  Weeks    Status  Partially Met      PT LONG TERM GOAL #4   Title  Pt will demo improved hip stability evident by her ability to maintain single leg balance without LOB or (+) trendelenburg deviation for atleast 10 sec, 2/3 trials.     Time  6    Period  Weeks    Status  Revised      PT LONG TERM GOAL #5   Title  Pt will demo proper BLE squat technique without Rt knee valgus deviation x5 reps.     Time  6    Period  Weeks    Status  New            Plan - 04/25/17 1739    Clinical Impression Statement  Pt's Rt hip fatigued quickly  with SLS exercise, and Rt ankle fatigued quickly with BAPS board.  She may benefit from additional HEP exercises for ankle. Pt tolerated all exercises without increase in symptoms.  Progressing towards goals.     Rehab Potential  Good    PT Frequency  2x / week    PT Duration  6 weeks    PT Treatment/Interventions  ADLs/Self Care Home Management;Electrical Stimulation;Moist Heat;Traction;Balance training;Therapeutic exercise;Therapeutic  activities;Functional mobility training;Neuromuscular re-education;Patient/family education;Passive range of motion;Manual techniques;Dry needling;Taping    PT Next Visit Plan  gentle hip strengthening and trunk stabilization progressions; add resistive exercises for Rt ankle.        Patient will benefit from skilled therapeutic intervention in order to improve the following deficits and impairments:  Decreased balance, Impaired flexibility, Hypomobility, Decreased strength, Decreased endurance, Decreased activity tolerance, Decreased mobility, Increased muscle spasms, Postural dysfunction, Pain, Improper body mechanics  Visit Diagnosis: Muscle weakness (generalized)  Chronic right-sided low back pain, with sciatica presence unspecified  Other muscle spasm     Problem List Patient Active Problem List   Diagnosis Date Noted  . 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 Breast cancer of upper-outer quadrant of left female breast ([place]) 12/07/2014  . Acute recurrent sinusitis 01/17/2013   Kerin Perna, PTA 04/25/17 5:41 PM  [facility] [address] [place], [place], [address] Phone: [phone]   Fax:  [phone]  Name: Christina [name] MRN: [id number] Date of Birth: Apr 10, 1951

## 2017-04-27 ENCOUNTER — Ambulatory Visit: Payer: Medicare Other | Admitting: Physical Therapy

## 2017-04-27 ENCOUNTER — Encounter: Payer: Self-pay | Admitting: Physical Therapy

## 2017-04-27 DIAGNOSIS — M6281 Muscle weakness (generalized): Secondary | ICD-10-CM | POA: Diagnosis not present

## 2017-04-27 DIAGNOSIS — M545 Low back pain: Secondary | ICD-10-CM

## 2017-04-27 DIAGNOSIS — M62838 Other muscle spasm: Secondary | ICD-10-CM

## 2017-04-27 DIAGNOSIS — G8929 Other chronic pain: Secondary | ICD-10-CM | POA: Diagnosis not present

## 2017-04-27 NOTE — Therapy (Signed)
[facility] [address] [place], [place], [address] Phone: [phone]   Fax:  [phone]  Physical Therapy Treatment  Patient Details  Name: Christina [name] MRN: [id number] Date of Birth: 1951-04-21 Referring Provider: Hulan Saas, DO   Encounter Date: 04/27/2017  PT End of Session - 04/27/17 1100    Visit Number  17    Date for PT Re-Evaluation  05/16/17    Authorization Type  Medicare A and B (G-codes completed on Jun 17, 2022 visit)    Authorization Time Period  04/05/17 to 05/16/17    PT Start Time  1059    PT Stop Time  1138    PT Time Calculation (min)  39 min    Activity Tolerance  Patient tolerated treatment well;No increased pain    Behavior During Therapy  WFL for tasks assessed/performed       Past Medical History:  Diagnosis Date  . Abrasion of right leg 12/17/2014  . Adrenal insufficiency ([place])   . Breast cancer of upper-outer quadrant of left female breast ([place]) 12/07/2014  . Cataract, immature    bilateral  . Dental bridge present    upper - x 2  . Dental crowns present   . Hypothyroidism   . Prediabetes     Past Surgical History:  Procedure Laterality Date  . ABDOMINOPLASTY    . BALLOON SINUPLASTY    . BREAST LUMPECTOMY WITH RADIOACTIVE SEED AND SENTINEL LYMPH NODE BIOPSY Left 12/22/2014   Procedure: BREAST LUMPECTOMY WITH RADIOACTIVE SEED AND SENTINEL LYMPH NODE BIOPSY;  Surgeon: Stark Klein, MD;  Location: [facility];  Service: General;  Laterality: Left;  . BUNIONECTOMY Right   . HAMMER TOE SURGERY Bilateral   . RHINOPLASTY    . SEPTOPLASTY      There were no vitals filed for this visit.  Subjective Assessment - 04/27/17 1109    Subjective  Lateral Rt thigh sore today.    Currently in Pain?  Yes    Pain Score  5     Pain Location  Hip    Pain Orientation  Lateral    Pain Descriptors / Indicators  Sore    Aggravating Factors   unsure    Pain Relieving Factors   massage    Multiple Pain Sites  No                      [facility] Adult PT Treatment/Exercise - 04/27/17 0001      Knee/Hip Exercises: Aerobic   Nustep  L2 6 min PTA present to monitor      Knee/Hip Exercises: Standing   Hip Abduction  -- red band 5 side step 3 sets VC on RT side steppign technique    SLS  RTLE 20 sec x 3 VC to contract quads and glutes      Knee/Hip Exercises: Seated   Other Seated Knee/Hip Exercises  -- ankle rocker board PF/DF 4 min    Sit to Sand  -- 4x6 yellow band set 1 & 2 red 3 & 4th set      Moist Heat Therapy   Number Minutes Moist Heat  10 Minutes    Moist Heat Location  -- RT lateral leg post session      Manual Therapy   Manual therapy comments  Instrument asst IT band strumming and glutes in Sl               PT Short Term Goals -  04/20/17 1014      PT SHORT TERM GOAL #1   Title  Pt will demo consistency and independence with her HEP to decrease pain and improve BLE strength.     Time  3    Period  Weeks    Status  Achieved      PT SHORT TERM GOAL #2   Title  Pt will report atleast 25% improvement in Rt buttock pain to allow for return to daily brisk walking routine without significant increase in symptoms.     Baseline  50% improved     Time  3    Period  Weeks    Status  Achieved      PT SHORT TERM GOAL #3   Title  Pt will demonstrate Rt tibial inversion to atleast 20 degrees to improve her mechanics with squatting and other closed chain activity.     Time  3    Period  Weeks    Status  On-going        PT Long Term Goals - 04/04/17 1642      PT LONG TERM GOAL #1   Title  Pt will demo improved BLE strength to 5/5 MMT which will increase her safety with daily activity.     Baseline  up to 4/5 MMT     Time  6    Period  Weeks    Status  Partially Met    Target Date  05/16/17      PT LONG TERM GOAL #2   Title  Pt will report being able to return to horse riding atleast 3x/week without difficulty to improve her  quality of life and participation in activity with her peers.     Baseline  1x/week is ideal in the winter time     Time  6    Period  Weeks    Status  Deferred      PT LONG TERM GOAL #3   Title  Pt will report atleast 75% improvement in her symptoms to allow for her to resume all prior activities without difficulty.     Baseline  50% improved     Time  6    Period  Weeks    Status  Partially Met      PT LONG TERM GOAL #4   Title  Pt will demo improved hip stability evident by her ability to maintain single leg balance without LOB or (+) trendelenburg deviation for atleast 10 sec, 2/3 trials.     Time  6    Period  Weeks    Status  Revised      PT LONG TERM GOAL #5   Title  Pt will demo proper BLE squat technique without Rt knee valgus deviation x5 reps.     Time  6    Period  Weeks    Status  New            Plan - 04/27/17 1101    Clinical Impression Statement  Some improvement noted in stabiity and lessening pain with more reps on her single leg stance.  Pt demonstrates improved hip stability and alignment with sit to stand and standing side stepping. Probably best improvement is pt's ability to do more exercise with less exacerbations during session and/or the next day after a session.     Rehab Potential  Good    PT Frequency  2x / week    PT Duration  6 weeks    PT  Treatment/Interventions  ADLs/Self Care Home Management;Electrical Stimulation;Moist Heat;Traction;Balance training;Therapeutic exercise;Therapeutic activities;Functional mobility training;Neuromuscular re-education;Patient/family education;Passive range of motion;Manual techniques;Dry needling;Taping    PT Next Visit Plan  gentle hip strengthening and trunk stabilization progressions; add resistive exercises for Rt ankle.     Consulted and Agree with Plan of Care  Patient       Patient will benefit from skilled therapeutic intervention in order to improve the following deficits and impairments:  Decreased  balance, Impaired flexibility, Hypomobility, Decreased strength, Decreased endurance, Decreased activity tolerance, Decreased mobility, Increased muscle spasms, Postural dysfunction, Pain, Improper body mechanics  Visit Diagnosis: Muscle weakness (generalized)  Chronic right-sided low back pain, with sciatica presence unspecified  Other muscle spasm     Problem List Patient Active Problem List   Diagnosis Date Noted  . 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 Breast cancer of upper-outer quadrant of left female breast ([place]) 12/07/2014  . Acute recurrent sinusitis 01/17/2013    Colm Lyford, PTA 04/27/2017, 11:38 AM  [facility] [address] [place], [place], [address] Phone: [phone]   Fax:  [phone]  Name: Christina [name] MRN: [id number] Date of Birth: 02/03/1951

## 2017-05-02 ENCOUNTER — Encounter: Payer: Self-pay | Admitting: Physical Therapy

## 2017-05-02 ENCOUNTER — Ambulatory Visit: Payer: Medicare Other | Attending: Family Medicine | Admitting: Physical Therapy

## 2017-05-02 DIAGNOSIS — M62838 Other muscle spasm: Secondary | ICD-10-CM | POA: Diagnosis not present

## 2017-05-02 DIAGNOSIS — M6281 Muscle weakness (generalized): Secondary | ICD-10-CM | POA: Insufficient documentation

## 2017-05-02 DIAGNOSIS — G8929 Other chronic pain: Secondary | ICD-10-CM | POA: Insufficient documentation

## 2017-05-02 DIAGNOSIS — M545 Low back pain: Secondary | ICD-10-CM | POA: Insufficient documentation

## 2017-05-02 NOTE — Therapy (Signed)
[facility] [address], [place], [place], [address] Phone: [phone]   Fax:  [phone]  Physical Therapy Treatment  Patient Details  Name: Christina [name] MRN: [id number] Date of Birth: 14-May-1950 Referring Provider: Hulan Saas, DO   Encounter Date: 05/02/2017  PT End of Session - 05/02/17 1433    Visit Number  18    Date for PT Re-Evaluation  05/16/17    Authorization Type  Medicare A and B (G-codes completed on 07-29-22 visit)    Authorization Time Period  04/05/17 to 05/16/17    PT Start Time  1401    PT Stop Time  1445    PT Time Calculation (min)  44 min    Activity Tolerance  Patient tolerated treatment well;No increased pain    Behavior During Therapy  WFL for tasks assessed/performed       Past Medical History:  Diagnosis Date  . Abrasion of right leg 12/17/2014  . Adrenal insufficiency ([place])   . Breast cancer of upper-outer quadrant of left female breast ([place]) 12/07/2014  . Cataract, immature    bilateral  . Dental bridge present    upper - x 2  . Dental crowns present   . Hypothyroidism   . Prediabetes     Past Surgical History:  Procedure Laterality Date  . ABDOMINOPLASTY    . BALLOON SINUPLASTY    . BREAST LUMPECTOMY WITH RADIOACTIVE SEED AND SENTINEL LYMPH NODE BIOPSY Left 12/22/2014   Procedure: BREAST LUMPECTOMY WITH RADIOACTIVE SEED AND SENTINEL LYMPH NODE BIOPSY;  Surgeon: Stark Klein, MD;  Location: [facility];  Service: General;  Laterality: Left;  . BUNIONECTOMY Right   . HAMMER TOE SURGERY Bilateral   . RHINOPLASTY    . SEPTOPLASTY      There were no vitals filed for this visit.  Subjective Assessment - 05/02/17 1406    Subjective  Pt reports that things are rough today, noting that she was able to go horse riding for 45 min, and take her dog for a walk for about 20 minutes. She did not note any pain during any of these activities, but after she finished there was  increased buttock pain.     Currently in Pain?  No/denies only pain with walking                       [facility] Adult PT Treatment/Exercise - 05/02/17 0001      Exercises   Other Exercises   Rt ankle PF/DF/inversion/eversion x20 reps with green TB (ankle DF only able to complete 15 reps due to increased muscle fatigue       Lumbar Exercises: Standing   Other Standing Lumbar Exercises  repeated extension in standing (pain resolved after 10 reps)       Lumbar Exercises: Supine   Other Supine Lumbar Exercises  Sciatic nerve floss on the Rt 2x15 reps; Lt x15 reps       Lumbar Exercises: Prone   Straight Leg Raise  5 reps    Other Prone Lumbar Exercises  prone on elbows with focus on breathing x3 min      Manual Therapy   Joint Mobilization  Rt iliac crest posterior hold during ambulation x8 reps across carpet              PT Education - 05/02/17 1426    Education provided  Yes    Education Details  improvements in pain with manual  technique and encouraged self technique at home; noted difficulties in pinpointing cause of pain due to inconsistency with symptoms     Person(s) Educated  Patient    Methods  Explanation    Comprehension  Verbalized understanding       PT Short Term Goals - 04/20/17 1014      PT SHORT TERM GOAL #1   Title  Pt will demo consistency and independence with her HEP to decrease pain and improve BLE strength.     Time  3    Period  Weeks    Status  Achieved      PT SHORT TERM GOAL #2   Title  Pt will report atleast 25% improvement in Rt buttock pain to allow for return to daily brisk walking routine without significant increase in symptoms.     Baseline  50% improved     Time  3    Period  Weeks    Status  Achieved      PT SHORT TERM GOAL #3   Title  Pt will demonstrate Rt tibial inversion to atleast 20 degrees to improve her mechanics with squatting and other closed chain activity.     Time  3    Period  Weeks    Status  On-going         PT Long Term Goals - 04/04/17 1642      PT LONG TERM GOAL #1   Title  Pt will demo improved BLE strength to 5/5 MMT which will increase her safety with daily activity.     Baseline  up to 4/5 MMT     Time  6    Period  Weeks    Status  Partially Met    Target Date  05/16/17      PT LONG TERM GOAL #2   Title  Pt will report being able to return to horse riding atleast 3x/week without difficulty to improve her quality of life and participation in activity with her peers.     Baseline  1x/week is ideal in the winter time     Time  6    Period  Weeks    Status  Deferred      PT LONG TERM GOAL #3   Title  Pt will report atleast 75% improvement in her symptoms to allow for her to resume all prior activities without difficulty.     Baseline  50% improved     Time  6    Period  Weeks    Status  Partially Met      PT LONG TERM GOAL #4   Title  Pt will demo improved hip stability evident by her ability to maintain single leg balance without LOB or (+) trendelenburg deviation for atleast 10 sec, 2/3 trials.     Time  6    Period  Weeks    Status  Revised      PT LONG TERM GOAL #5   Title  Pt will demo proper BLE squat technique without Rt knee valgus deviation x5 reps.     Time  6    Period  Weeks    Status  New            Plan - 05/02/17 1446    Clinical Impression Statement  Pt arrived today with reports of increased pain in her sacrum region following a fall this past Saturday. She has been completing her HEP and walking/horse riding with increased pain in the  low back following this. Therapist spent a majority of the session providing techniques to decrease pain with walking, noting pain free ambulation with manual techniques. Pt was able to demonstrate good understanding of self-mobilization technique to continue with at home. Ended without report of increase in pain and improved ambulation exiting the clinic.     Rehab Potential  Good    PT Frequency  2x / week     PT Duration  6 weeks    PT Treatment/Interventions  ADLs/Self Care Home Management;Electrical Stimulation;Moist Heat;Traction;Balance training;Therapeutic exercise;Therapeutic activities;Functional mobility training;Neuromuscular re-education;Patient/family education;Passive range of motion;Manual techniques;Dry needling;Taping    PT Next Visit Plan  f/u on self mobilization technique; gentle hip strengthening and trunk stabilization progressions; continue with resistance exercise for Rt ankle     PT Home Exercise Plan  thoracic rotation, tibial IR mobilization, supine hip extension isometric, sit to stand, ankle stretch     Consulted and Agree with Plan of Care  Patient       Patient will benefit from skilled therapeutic intervention in order to improve the following deficits and impairments:  Decreased balance, Impaired flexibility, Hypomobility, Decreased strength, Decreased endurance, Decreased activity tolerance, Decreased mobility, Increased muscle spasms, Postural dysfunction, Pain, Improper body mechanics  Visit Diagnosis: Muscle weakness (generalized)  Chronic right-sided low back pain, with sciatica presence unspecified  Other muscle spasm     Problem List Patient Active Problem List   Diagnosis Date Noted  . 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 Breast cancer of upper-outer quadrant of left female breast ([place]) 12/07/2014  . Acute recurrent sinusitis 01/17/2013    2:49 PM,05/02/17 Elly Modena PT, DPT [facility] [address] [place], [place], [address] Phone: [phone]   Fax:  [phone]  Name: Christina [name] MRN: [id number] Date of Birth: Jun 05, 1950

## 2017-05-04 ENCOUNTER — Ambulatory Visit: Payer: Medicare Other | Admitting: Physical Therapy

## 2017-05-04 DIAGNOSIS — M6281 Muscle weakness (generalized): Secondary | ICD-10-CM

## 2017-05-04 DIAGNOSIS — M62838 Other muscle spasm: Secondary | ICD-10-CM

## 2017-05-04 DIAGNOSIS — M545 Low back pain: Secondary | ICD-10-CM | POA: Diagnosis not present

## 2017-05-04 DIAGNOSIS — G8929 Other chronic pain: Secondary | ICD-10-CM | POA: Diagnosis not present

## 2017-05-04 NOTE — Therapy (Signed)
[facility] [address], [place], [place], [address] Phone: [phone]   Fax:  [phone]  Physical Therapy Treatment  Patient Details  Name: Christina [name] MRN: [id number] Date of Birth: [birth year] Referring Provider: Hulan Saas, DO   Encounter Date: 05/04/2017  PT End of Session - 05/04/17 1157    Visit Number  19    Date for PT Re-Evaluation  05/16/17    Authorization Type  Medicare A and B (G-codes completed on July 03, 2022 visit)    Authorization Time Period  04/05/17 to 05/16/17    PT Start Time  1100    PT Stop Time  1145    PT Time Calculation (min)  45 min    Activity Tolerance  Patient tolerated treatment well;No increased pain    Behavior During Therapy  WFL for tasks assessed/performed       Past Medical History:  Diagnosis Date  . Abrasion of right leg 12/17/2014  . Adrenal insufficiency ([place])   . Breast cancer of upper-outer quadrant of left female breast ([place]) 12/07/2014  . Cataract, immature    bilateral  . Dental bridge present    upper - x 2  . Dental crowns present   . Hypothyroidism   . Prediabetes     Past Surgical History:  Procedure Laterality Date  . ABDOMINOPLASTY    . BALLOON SINUPLASTY    . BREAST LUMPECTOMY WITH RADIOACTIVE SEED AND SENTINEL LYMPH NODE BIOPSY Left 12/22/2014   Procedure: BREAST LUMPECTOMY WITH RADIOACTIVE SEED AND SENTINEL LYMPH NODE BIOPSY;  Surgeon: Stark Klein, MD;  Location: [facility];  Service: General;  Laterality: Left;  . BUNIONECTOMY Right   . HAMMER TOE SURGERY Bilateral   . RHINOPLASTY    . SEPTOPLASTY      There were no vitals filed for this visit.  Subjective Assessment - 05/04/17 1103    Subjective  Pt reports that things are going better today. Her pain is improved since her last session and she has not felt it in her low back/scarum area at all since her last session.     Currently in Pain?  No/denies Pt is not rating pain, its  just there but not bad                      [facility] Adult PT Treatment/Exercise - 05/04/17 0001      Ambulation/Gait   Gait Comments  Pt encouraged to ambulate with increased speed x4 trials across gym with 80% decrease in pain      Knee/Hip Exercises: Aerobic   Nustep  L2 x4 min therapist present to discuss progress       Manual Therapy   Joint Mobilization  Rt hip flexion MWM x10 reps in supine, standing Rt hip extension MWM 2x10 reps; Rt iliac crest posterior mobilization during Rt hip extension 2x10 reps     Soft tissue mobilization  Rt hip adductor group     Myofascial Release  TPR Rt iliacus     Passive ROM  Rt quadriceps stretch 3x30 sec in sidelying              PT Education - 05/04/17 1151    Education provided  Yes    Education Details  implications for manual techniques completed; encouraged pt to ambulate with increased speed due to demands her previous technique was having on her musculature     Person(s) Educated  Patient    Methods  Explanation    Comprehension  Verbalized understanding       PT Short Term Goals - 04/20/17 1014      PT SHORT TERM GOAL #1   Title  Pt will demo consistency and independence with her HEP to decrease pain and improve BLE strength.     Time  3    Period  Weeks    Status  Achieved      PT SHORT TERM GOAL #2   Title  Pt will report atleast 25% improvement in Rt buttock pain to allow for return to daily brisk walking routine without significant increase in symptoms.     Baseline  50% improved     Time  3    Period  Weeks    Status  Achieved      PT SHORT TERM GOAL #3   Title  Pt will demonstrate Rt tibial inversion to atleast 20 degrees to improve her mechanics with squatting and other closed chain activity.     Time  3    Period  Weeks    Status  On-going        PT Long Term Goals - 04/04/17 1642      PT LONG TERM GOAL #1   Title  Pt will demo improved BLE strength to 5/5 MMT which will increase her  safety with daily activity.     Baseline  up to 4/5 MMT     Time  6    Period  Weeks    Status  Partially Met    Target Date  05/16/17      PT LONG TERM GOAL #2   Title  Pt will report being able to return to horse riding atleast 3x/week without difficulty to improve her quality of life and participation in activity with her peers.     Baseline  1x/week is ideal in the winter time     Time  6    Period  Weeks    Status  Deferred      PT LONG TERM GOAL #3   Title  Pt will report atleast 75% improvement in her symptoms to allow for her to resume all prior activities without difficulty.     Baseline  50% improved     Time  6    Period  Weeks    Status  Partially Met      PT LONG TERM GOAL #4   Title  Pt will demo improved hip stability evident by her ability to maintain single leg balance without LOB or (+) trendelenburg deviation for atleast 10 sec, 2/3 trials.     Time  6    Period  Weeks    Status  Revised      PT LONG TERM GOAL #5   Title  Pt will demo proper BLE squat technique without Rt knee valgus deviation x5 reps.     Time  6    Period  Weeks    Status  New            Plan - 05/04/17 1159    Clinical Impression Statement  Pt arrived today with reports of resolved posterior hip pain since her last session. Focused on manual techniques to address pain with hip extension, noting pain free active hip extension in prone following this. Pt does continue to have groin pain intermittently with ambulation and was able to decrease this by 80% with some instruction from therapist to increase speed. Will continue with current  POC.     Rehab Potential  Good    PT Frequency  2x / week    PT Duration  6 weeks    PT Treatment/Interventions  ADLs/Self Care Home Management;Electrical Stimulation;Moist Heat;Traction;Balance training;Therapeutic exercise;Therapeutic activities;Functional mobility training;Neuromuscular re-education;Patient/family education;Passive range of  motion;Manual techniques;Dry needling;Taping    PT Next Visit Plan  F/U on hip extension pain in prone; gentle adductor strengthening; continue with resistance exercise for Rt ankle     PT Home Exercise Plan  thoracic rotation, tibial IR mobilization, supine hip extension isometric, sit to stand, ankle stretch     Consulted and Agree with Plan of Care  Patient       Patient will benefit from skilled therapeutic intervention in order to improve the following deficits and impairments:  Decreased balance, Impaired flexibility, Hypomobility, Decreased strength, Decreased endurance, Decreased activity tolerance, Decreased mobility, Increased muscle spasms, Postural dysfunction, Pain, Improper body mechanics  Visit Diagnosis: Muscle weakness (generalized)  Chronic right-sided low back pain, with sciatica presence unspecified  Other muscle spasm     Problem List Patient Active Problem List   Diagnosis Date Noted  . 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 Breast cancer of upper-outer quadrant of left female breast ([place]) 12/07/2014  . Acute recurrent sinusitis 01/17/2013    12:07 PM,05/04/17 Elly Modena PT, DPT [place] [facility] [address] [place], [place], [address] Phone: [phone]   Fax:  [phone]  Name: Christina [name] MRN: [id number] Date of Birth: [birth year]

## 2017-05-07 DIAGNOSIS — E785 Hyperlipidemia, unspecified: Secondary | ICD-10-CM | POA: Diagnosis not present

## 2017-05-07 DIAGNOSIS — E612 Magnesium deficiency: Secondary | ICD-10-CM | POA: Diagnosis not present

## 2017-05-07 DIAGNOSIS — D508 Other iron deficiency anemias: Secondary | ICD-10-CM | POA: Diagnosis not present

## 2017-05-07 DIAGNOSIS — R739 Hyperglycemia, unspecified: Secondary | ICD-10-CM | POA: Diagnosis not present

## 2017-05-07 DIAGNOSIS — N951 Menopausal and female climacteric states: Secondary | ICD-10-CM | POA: Diagnosis not present

## 2017-05-07 DIAGNOSIS — D72829 Elevated white blood cell count, unspecified: Secondary | ICD-10-CM | POA: Diagnosis not present

## 2017-05-07 DIAGNOSIS — E039 Hypothyroidism, unspecified: Secondary | ICD-10-CM | POA: Diagnosis not present

## 2017-05-07 DIAGNOSIS — E559 Vitamin D deficiency, unspecified: Secondary | ICD-10-CM | POA: Diagnosis not present

## 2017-05-08 ENCOUNTER — Ambulatory Visit: Payer: Medicare Other | Admitting: Physical Therapy

## 2017-05-08 DIAGNOSIS — M6281 Muscle weakness (generalized): Secondary | ICD-10-CM

## 2017-05-08 DIAGNOSIS — G8929 Other chronic pain: Secondary | ICD-10-CM | POA: Diagnosis not present

## 2017-05-08 DIAGNOSIS — M545 Low back pain: Secondary | ICD-10-CM | POA: Diagnosis not present

## 2017-05-08 DIAGNOSIS — M62838 Other muscle spasm: Secondary | ICD-10-CM | POA: Diagnosis not present

## 2017-05-08 NOTE — Therapy (Signed)
[facility] [address], [place], [place], [address] Phone: [phone]   Fax:  [phone]  Physical Therapy Treatment  Patient Details  Name: Christina [name] MRN: [id number] Date of Birth: Aug 10, 1950 Referring Provider: Hulan Saas, DO   Encounter Date: 05/08/2017  PT End of Session - 05/08/17 1227    Visit Number  20    Date for PT Re-Evaluation  05/16/17    Authorization Type  Medicare A and B (G-codes completed on 07-07-22 visit)    Authorization Time Period  04/05/17 to 05/16/17    PT Start Time  1215    PT Stop Time  1300    PT Time Calculation (min)  45 min    Activity Tolerance  Patient tolerated treatment well;No increased pain    Behavior During Therapy  WFL for tasks assessed/performed       Past Medical History:  Diagnosis Date  . Abrasion of right leg 12/17/2014  . Adrenal insufficiency ([place])   . Breast cancer of upper-outer quadrant of left female breast ([place]) 12/07/2014  . Cataract, immature    bilateral  . Dental bridge present    upper - x 2  . Dental crowns present   . Hypothyroidism   . Prediabetes     Past Surgical History:  Procedure Laterality Date  . ABDOMINOPLASTY    . BALLOON SINUPLASTY    . BREAST LUMPECTOMY WITH RADIOACTIVE SEED AND SENTINEL LYMPH NODE BIOPSY Left 12/22/2014   Procedure: BREAST LUMPECTOMY WITH RADIOACTIVE SEED AND SENTINEL LYMPH NODE BIOPSY;  Surgeon: Stark Klein, MD;  Location: [facility];  Service: General;  Laterality: Left;  . BUNIONECTOMY Right   . HAMMER TOE SURGERY Bilateral   . RHINOPLASTY    . SEPTOPLASTY      There were no vitals filed for this visit.  Subjective Assessment - 05/08/17 1217    Subjective  Pt reports that she was so sore after her last session and doing alot of stair negotiation that she focused primarily on her walking and stretches. She says things are going well currently and the soreness is almost gone. She had no  complaints with walking other than quadriceps soreness.     Currently in Pain?  No/denies                      [facility] Adult PT Treatment/Exercise - 05/08/17 0001      Exercises   Other Exercises   Rt/Lt ankle DF stretch: gastroc and soleus 2x30 sec each      Lumbar Exercises: Supine   Bent Knee Raise Limitations  bent knee heel tap starting 90/90 position 3x5 reps each       Knee/Hip Exercises: Stretches   Counselling psychologist Limitations  prone with strap       Knee/Hip Exercises: Machines for Strengthening   Total Gym Leg Press  seat 5, 80# x20 reps; single leg Rt/Lt x15 reps 30#      Knee/Hip Exercises: Supine   Other Supine Knee/Hip Exercises  Rt ankle PF with red TB x20 reps; ankle eversion x20 reps; ankle DF x20 reps, ankle inversion x20 reps       Manual Therapy   Soft tissue mobilization  Attempted STM Rt gastroc, but pt had increased sensitivity to this.      Ankle Exercises: Seated   Other Seated Ankle Exercises  Self rolling Rt gastroc in long  sitting             PT Education - 05/08/17 1310    Education provided  Yes    Education Details  process of building muscle/DOMS; changes in muscle fiber type as people age (recommended exercise prescriptions with this)    Person(s) Educated  Patient    Methods  Explanation    Comprehension  Verbalized understanding       PT Short Term Goals - 05/08/17 1308      PT SHORT TERM GOAL #1   Title  Pt will demo consistency and independence with her HEP to decrease pain and improve BLE strength.     Time  3    Period  Weeks    Status  Achieved      PT SHORT TERM GOAL #2   Title  Pt will report atleast 25% improvement in Rt buttock pain to allow for return to daily brisk walking routine without significant increase in symptoms.     Baseline  50% improved     Time  3    Period  Weeks    Status  Achieved      PT SHORT TERM GOAL #3   Title  Pt will  demonstrate Rt tibial inversion to atleast 20 degrees to improve her mechanics with squatting and other closed chain activity.     Time  3    Period  Weeks    Status  On-going        PT Long Term Goals - 05/08/17 1308      PT LONG TERM GOAL #1   Title  Pt will demo improved BLE strength to 5/5 MMT which will increase her safety with daily activity.     Baseline  up to 4/5 MMT     Time  6    Period  Weeks    Status  Partially Met      PT LONG TERM GOAL #2   Title  Pt will report being able to return to horse riding atleast 3x/week without difficulty to improve her quality of life and participation in activity with her peers.     Baseline  1x/week is ideal in the winter time     Time  6    Period  Weeks    Status  Deferred      PT LONG TERM GOAL #3   Title  Pt will report atleast 75% improvement in her symptoms to allow for her to resume all prior activities without difficulty.     Baseline  50% improved     Time  6    Period  Weeks    Status  Partially Met      PT LONG TERM GOAL #4   Title  Pt will demo improved hip stability evident by her ability to maintain single leg balance without LOB or (+) trendelenburg deviation for atleast 10 sec, 2/3 trials.     Time  6    Period  Weeks    Status  On-going      PT LONG TERM GOAL #5   Title  Pt will demo proper BLE squat technique without Rt knee valgus deviation x5 reps.     Time  6    Period  Weeks    Status  Partially Met            Plan - 05/08/17 1301    Clinical Impression Statement  Pt arrived today with resolved pain since her last session,  noting increased quadriceps soreness in BLE secondary to muscle fatigue only. Pt also ambulated into the clinic without pain, reporting this was also the case during her walking over the past couple of days. Session continued with therex to promote LE strength and flexibility which pt was able to complete with minimal difficulty. Noted improved core activation and breathing with  stability exercises as well. Will continue with current POC.     Rehab Potential  Good    PT Frequency  2x / week    PT Duration  6 weeks    PT Treatment/Interventions  ADLs/Self Care Home Management;Electrical Stimulation;Moist Heat;Traction;Balance training;Therapeutic exercise;Therapeutic activities;Functional mobility training;Neuromuscular re-education;Patient/family education;Passive range of motion;Manual techniques;Dry needling;Taping    PT Next Visit Plan  gentle adductor/abductor strengthening; soft tissue techniques to Rt ITB/quad; continue with resistance exercise for Rt ankle     PT Home Exercise Plan  thoracic rotation, tibial IR mobilization, supine hip extension isometric, sit to stand, ankle stretch     Consulted and Agree with Plan of Care  Patient       Patient will benefit from skilled therapeutic intervention in order to improve the following deficits and impairments:  Decreased balance, Impaired flexibility, Hypomobility, Decreased strength, Decreased endurance, Decreased activity tolerance, Decreased mobility, Increased muscle spasms, Postural dysfunction, Pain, Improper body mechanics  Visit Diagnosis: Muscle weakness (generalized)  Chronic right-sided low back pain, with sciatica presence unspecified  Other muscle spasm     Problem List Patient Active Problem List   Diagnosis Date Noted  . 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 Breast cancer of upper-outer quadrant of left female breast ([place]) 12/07/2014  . Acute recurrent sinusitis 01/17/2013    1:11 PM,05/08/17 Elly Modena PT, DPT [place] [facility] [address], [place], [place], [address] Phone: [phone]   Fax:  [phone]  Name: Christina [name] MRN: [id number] Date of Birth: 10-24-1950

## 2017-05-08 NOTE — Patient Instructions (Signed)
   CALF ROLL WITH BALL - SELF MASSAGE  Sit on the floor and and place a small ball, such as a tennis ball or golf ball under your calf.    Next, move your body on the ball so it rolls up and down your calf for a deep tissue massage.  atleast once a day.   [place] [address] [place],  [address] Phone # [phone] Fax [phone]

## 2017-05-11 ENCOUNTER — Ambulatory Visit: Payer: Medicare Other | Admitting: Physical Therapy

## 2017-05-11 DIAGNOSIS — M6281 Muscle weakness (generalized): Secondary | ICD-10-CM

## 2017-05-11 DIAGNOSIS — M545 Low back pain: Secondary | ICD-10-CM | POA: Diagnosis not present

## 2017-05-11 DIAGNOSIS — G8929 Other chronic pain: Secondary | ICD-10-CM

## 2017-05-11 DIAGNOSIS — M62838 Other muscle spasm: Secondary | ICD-10-CM | POA: Diagnosis not present

## 2017-05-11 NOTE — Therapy (Signed)
[facility] [address] [place], [place], [address] Phone: [phone]   Fax:  [phone]  Physical Therapy Treatment  Patient Details  Name: Christina [name] MRN: [id number] Date of Birth: [birth year] Referring Provider: Hulan Saas, DO   Encounter Date: 05/11/2017  PT End of Session - 05/11/17 1009    Visit Number  21    Date for PT Re-Evaluation  05/16/17    Authorization Type  Medicare A and B (G-codes completed on Jun 26, 2022 visit)    Authorization Time Period  04/05/17 to 05/16/17    PT Start Time  0930    PT Stop Time  1012    PT Time Calculation (min)  42 min    Activity Tolerance  Patient tolerated treatment well;No increased pain    Behavior During Therapy  WFL for tasks assessed/performed       Past Medical History:  Diagnosis Date  . Abrasion of right leg 12/17/2014  . Adrenal insufficiency ([place])   . Breast cancer of upper-outer quadrant of left female breast ([place]) 12/07/2014  . Cataract, immature    bilateral  . Dental bridge present    upper - x 2  . Dental crowns present   . Hypothyroidism   . Prediabetes     Past Surgical History:  Procedure Laterality Date  . ABDOMINOPLASTY    . BALLOON SINUPLASTY    . BREAST LUMPECTOMY WITH RADIOACTIVE SEED AND SENTINEL LYMPH NODE BIOPSY Left 12/22/2014   Procedure: BREAST LUMPECTOMY WITH RADIOACTIVE SEED AND SENTINEL LYMPH NODE BIOPSY;  Surgeon: Stark Klein, MD;  Location: [facility];  Service: General;  Laterality: Left;  . BUNIONECTOMY Right   . HAMMER TOE SURGERY Bilateral   . RHINOPLASTY    . SEPTOPLASTY      There were no vitals filed for this visit.  Subjective Assessment - 05/11/17 0934    Subjective  Pt reports that she felt great after her last session. She went horseback riding the following day and after this, she says she noticed a sharp pain in her Rt knee as well as her low back spasm. She tried taking medication and ice which  only helped a little. Today, she is able to do more at home.     Currently in Pain?  No/denies               [facility] Adult PT Treatment/Exercise - 05/11/17 0001      Lumbar Exercises: Stretches   Double Knee to Chest Stretch  3 reps;20 seconds    Lower Trunk Rotation  2 reps;20 seconds    Lower Trunk Rotation Limitations  Rt side only       Lumbar Exercises: Aerobic   Stationary Bike  Nustep L2 x5 min PT present to discuss session      Knee/Hip Exercises: Standing   Heel Raises  Both;1 set;20 reps    Hip Abduction  Both;1 set;10 reps;Knee straight    Hip Extension  Both;1 set;10 reps    Other Standing Knee Exercises  low rows, high rows, shoulder extension with red TB x15 reps             PT Education - 05/11/17 1001    Education provided  Yes    Education Details  Importance of completing stretches and other techniques discussed when having pain onset after riding; also discussed possibilities of needing to hold off on horseback riding to allow for more steady progress in therapy without setbacks  Person(s) Educated  Patient    Methods  Explanation    Comprehension  Verbalized understanding       PT Short Term Goals - 05/08/17 1308      PT SHORT TERM GOAL #1   Title  Pt will demo consistency and independence with her HEP to decrease pain and improve BLE strength.     Time  3    Period  Weeks    Status  Achieved      PT SHORT TERM GOAL #2   Title  Pt will report atleast 25% improvement in Rt buttock pain to allow for return to daily brisk walking routine without significant increase in symptoms.     Baseline  50% improved     Time  3    Period  Weeks    Status  Achieved      PT SHORT TERM GOAL #3   Title  Pt will demonstrate Rt tibial inversion to atleast 20 degrees to improve her mechanics with squatting and other closed chain activity.     Time  3    Period  Weeks    Status  On-going        PT Long Term Goals - 05/08/17 1308      PT LONG TERM  GOAL #1   Title  Pt will demo improved BLE strength to 5/5 MMT which will increase her safety with daily activity.     Baseline  up to 4/5 MMT     Time  6    Period  Weeks    Status  Partially Met      PT LONG TERM GOAL #2   Title  Pt will report being able to return to horse riding atleast 3x/week without difficulty to improve her quality of life and participation in activity with her peers.     Baseline  1x/week is ideal in the winter time     Time  6    Period  Weeks    Status  Deferred      PT LONG TERM GOAL #3   Title  Pt will report atleast 75% improvement in her symptoms to allow for her to resume all prior activities without difficulty.     Baseline  50% improved     Time  6    Period  Weeks    Status  Partially Met      PT LONG TERM GOAL #4   Title  Pt will demo improved hip stability evident by her ability to maintain single leg balance without LOB or (+) trendelenburg deviation for atleast 10 sec, 2/3 trials.     Time  6    Period  Weeks    Status  On-going      PT LONG TERM GOAL #5   Title  Pt will demo proper BLE squat technique without Rt knee valgus deviation x5 reps.     Time  6    Period  Weeks    Status  Partially Met            Plan - 05/11/17 1009    Clinical Impression Statement  Pt arrived today frustrated over acute onset of low back pain following horseback riding. Therapist provided encouragement to pt regarding acute low back spasm and she verbalized understanding by the end of today's session. Due to positive responses in pain following PT sessions, it could be likely that [place] her  occasional flare ups of pain. Pt does not have the hip extensor strength to complete this without compensation and we will plan to assess this next session. Ended with pt reports of feeling good and pain free.    Rehab Potential  Good    PT Frequency  2x / week    PT Duration  6 weeks    PT Treatment/Interventions   ADLs/Self Care Home Management;Electrical Stimulation;Moist Heat;Traction;Balance training;Therapeutic exercise;Therapeutic activities;Functional mobility training;Neuromuscular re-education;Patient/family education;Passive range of motion;Manual techniques;Dry needling;Taping    PT Next Visit Plan  gentle adductor/abductor strengthening; soft tissue techniques to Rt ITB/quad; continue with resistance exercise for Rt ankle     PT Home Exercise Plan  thoracic rotation, tibial IR mobilization, supine hip extension isometric, sit to stand, ankle stretch     Consulted and Agree with Plan of Care  Patient       Patient will benefit from skilled therapeutic intervention in order to improve the following deficits and impairments:  Decreased balance, Impaired flexibility, Hypomobility, Decreased strength, Decreased endurance, Decreased activity tolerance, Decreased mobility, Increased muscle spasms, Postural dysfunction, Pain, Improper body mechanics  Visit Diagnosis: Muscle weakness (generalized)  Chronic right-sided low back pain, with sciatica presence unspecified  Other muscle spasm     Problem List Patient Active Problem List   Diagnosis Date Noted  . 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 Breast cancer of upper-outer quadrant of left female breast ([place]) 12/07/2014  . Acute recurrent sinusitis 01/17/2013    11:05 AM,05/11/17 Elly Modena PT, DPT [place] [facility] [address] [place], [place], [address] Phone: [phone]   Fax:  [phone]  Name: Christina [name] MRN: [id number] Date of Birth: 26-Dec-1950

## 2017-05-14 ENCOUNTER — Ambulatory Visit: Payer: Medicare Other | Admitting: Physical Therapy

## 2017-05-15 DIAGNOSIS — H524 Presbyopia: Secondary | ICD-10-CM | POA: Diagnosis not present

## 2017-05-15 DIAGNOSIS — H04123 Dry eye syndrome of bilateral lacrimal glands: Secondary | ICD-10-CM | POA: Diagnosis not present

## 2017-05-15 DIAGNOSIS — H25013 Cortical age-related cataract, bilateral: Secondary | ICD-10-CM | POA: Diagnosis not present

## 2017-05-15 DIAGNOSIS — H2513 Age-related nuclear cataract, bilateral: Secondary | ICD-10-CM | POA: Diagnosis not present

## 2017-05-17 ENCOUNTER — Encounter: Payer: Self-pay | Admitting: Physical Therapy

## 2017-05-17 ENCOUNTER — Ambulatory Visit: Payer: Medicare Other | Admitting: Physical Therapy

## 2017-05-17 DIAGNOSIS — M62838 Other muscle spasm: Secondary | ICD-10-CM

## 2017-05-17 DIAGNOSIS — M6281 Muscle weakness (generalized): Secondary | ICD-10-CM

## 2017-05-17 DIAGNOSIS — M545 Low back pain: Secondary | ICD-10-CM | POA: Diagnosis not present

## 2017-05-17 DIAGNOSIS — G8929 Other chronic pain: Secondary | ICD-10-CM | POA: Diagnosis not present

## 2017-05-17 NOTE — Therapy (Signed)
[facility] [address], [place], [place], [address] Phone: [phone]   Fax:  [phone]  Physical Therapy Treatment/reassessment   Patient Details  Name: Christina [name] MRN: [id number] Date of Birth: 1951/01/28 Referring Provider: Hulan Saas, DO   Encounter Date: 05/17/2017  PT End of Session - 05/17/17 1020    Visit Number  22    Date for PT Re-Evaluation  06/15/17    Authorization Type  Medicare A and B (G-codes completed on 2022/07/05 visit)    Authorization Time Period  04/05/17 to 05/16/17, NEW 05/17/17 to 06/15/17    PT Start Time  1017    PT Stop Time  1100    PT Time Calculation (min)  43 min    Activity Tolerance  Patient tolerated treatment well;No increased pain    Behavior During Therapy  WFL for tasks assessed/performed       Past Medical History:  Diagnosis Date  . Abrasion of right leg 12/17/2014  . Adrenal insufficiency ([place])   . Breast cancer of upper-outer quadrant of left female breast ([place]) 12/07/2014  . Cataract, immature    bilateral  . Dental bridge present    upper - x 2  . Dental crowns present   . Hypothyroidism   . Prediabetes     Past Surgical History:  Procedure Laterality Date  . ABDOMINOPLASTY    . BALLOON SINUPLASTY    . BREAST LUMPECTOMY WITH RADIOACTIVE SEED AND SENTINEL LYMPH NODE BIOPSY Left 12/22/2014   Procedure: BREAST LUMPECTOMY WITH RADIOACTIVE SEED AND SENTINEL LYMPH NODE BIOPSY;  Surgeon: Stark Klein, MD;  Location: [facility];  Service: General;  Laterality: Left;  . BUNIONECTOMY Right   . HAMMER TOE SURGERY Bilateral   . RHINOPLASTY    . SEPTOPLASTY      There were no vitals filed for this visit.  Subjective Assessment - 05/17/17 1024    Subjective  Pt reports that she is overall 60% improved. She no longer has the pain in the front of her thigh, but her Rt buttock seems to be bothering more than anything. Pt reports that walking her dog  yesterday was fine, but about 6-7 hours afterwards she was feeling it in her Rt buttock area. She tried completing some of her stretches but this didn't seem to help.     Currently in Pain?  Yes    Pain Score  2     Pain Location  Buttocks    Pain Orientation  Right    Pain Descriptors / Indicators  Aching    Pain Type  Chronic pain    Pain Radiating Towards  none     Pain Onset  More than a month ago    Pain Frequency  Intermittent    Aggravating Factors   alot of activity    Pain Relieving Factors  sometimes tennis ball helps     Effect of Pain on Daily Activities  still needs to take advil when going to bed, to help with sleep     Multiple Pain Sites  No         [facility] PT Assessment - 05/17/17 0001      Assessment   Medical Diagnosis  Lumbar radiculopathy     Referring Provider  Hulan Saas, DO    Onset Date/Surgical Date  -- ~3-6 months ago     Prior Therapy  none       [place]  Private residence      Prior Function   Level of Independence  Independent      Observation/Other Assessments   Focus on Therapeutic Outcomes (FOTO)   39%      Sensation   Light Touch  Appears Intact    Additional Comments  Pt denies dumbness on the anterior thigh       Functional Tests   Functional tests  Squat;Single leg stance;Sit to Stand      Squat   Comments  7/10 reps without knee valgus on the Rt       Single Leg Stance   Comments  Lt 13 sec, Rt 20 sec (no trendelenburg noted)       Sit to Stand   Comments  able to stand without use of UEs       AROM   Lumbar Flexion  pain Rt buttock area with repeated flexion x10 reps    Lumbar Extension  WNL, pain end range extension, but no change    Lumbar - Right Side Bend  --    Lumbar - Left Side Bend  --      Strength   Overall Strength Comments  trunk strength: 3+/5, leg lower to 65 deg only     Right Hip Flexion  4-/5 pain reported Rt buttock area    Right Hip Extension  4+/5    Right Hip  ABduction  4+/5    Left Hip Flexion  5/5    Left Hip Extension  4+/5 pain free    Left Hip ABduction  5/5    Right Knee Extension  5/5    Left Knee Extension  5/5    Right Ankle Dorsiflexion  5/5    Left Ankle Dorsiflexion  5/5      Flexibility   Soft Tissue Assessment /Muscle Length  yes    Hamstrings  WNL    Piriformis  WNL      Palpation   Spinal mobility  hypomobile throughout Lumbar spine, non tender     Palpation comment  Rt glute med and piriformis tender with palpation but pt reports improved overall                   [facility] Adult PT Treatment/Exercise - 05/17/17 0001      Ambulation/Gait   Gait Comments  Pt ambulating between 1.8 and 2.2 speed, increased grade to 2% x5 min (+) trendelenburg, increased Rt hip ER noted during push off and intermittent decrease in Rt hip extension. Pt preferring to use handrails for support.       Lumbar Exercises: Standing   Other Standing Lumbar Exercises  lumbar flexion x10 reps (increased pain coming back up)    Other Standing Lumbar Exercises  lumbat extension x10 reps, no change in symptoms              PT Education - 05/17/17 1129    Education provided  Yes    Education Details  re-eval findings/POC moving forward to educate on lifestyle modifications and establish advanced program for home/gym at discharge; discontinued piriformis stretch for now    Person(s) Educated  Patient    Methods  Explanation    Comprehension  Verbalized understanding       PT Short Term Goals - 05/17/17 1039      PT SHORT TERM GOAL #1   Title  Pt will demo consistency and independence with her HEP to decrease pain and improve BLE strength.  Time  3    Period  Weeks    Status  Achieved      PT SHORT TERM GOAL #2   Title  Pt will report atleast 25% improvement in Rt buttock pain to allow for return to daily brisk walking routine without significant increase in symptoms.     Baseline  60% improved     Time  3    Period  Weeks     Status  Achieved      PT SHORT TERM GOAL #3   Title  Pt will demonstrate Rt tibial inversion to atleast 20 degrees to improve her mechanics with squatting and other closed chain activity.     Time  3    Period  Weeks    Status  On-going        PT Long Term Goals - 05/17/17 1045      PT LONG TERM GOAL #1   Title  Pt will demo improved BLE strength to 5/5 MMT which will increase her safety with daily activity.     Baseline  up to 4/5 and 4+/5 MMT     Time  4    Period  Weeks    Status  Partially Met    Target Date  06/14/17      PT LONG TERM GOAL #2   Title  Pt will report being able to return to horse riding atleast 3x/week without difficulty to improve her quality of life and participation in activity with her peers.     Baseline  1x/week is ideal in the winter time     Time  4    Period  Weeks    Status  Deferred      PT LONG TERM GOAL #3   Title  Pt will report atleast 75% improvement in her symptoms to allow for her to resume all prior activities without difficulty.     Baseline  60% improved     Time  6    Period  Weeks    Status  Partially Met      PT LONG TERM GOAL #4   Title  Pt will demo improved hip stability evident by her ability to maintain single leg balance without LOB or (+) trendelenburg deviation for atleast 10 sec, 2/3 trials.     Baseline  met on the Rt     Time  4    Period  Weeks    Status  Achieved      PT LONG TERM GOAL #5   Title  Pt will demo proper BLE squat technique without Rt knee valgus deviation x5 reps.     Baseline  7/10 reps without valgus     Time  6    Period  Weeks    Status  Achieved      Additional Long Term Goals   Additional Long Term Goals  Yes      PT LONG TERM GOAL #6   Title  Pt will lift atleast 10lb box from the floor, 3/5 trials, to decrease strain on her low back and allow her to bring a case of water into her house.     Time  4    Period  Weeks    Status  New            Plan - 05/17/17 1102     Clinical Impression Statement  Pt was reassessed this visit having met atleast 2-3 of her long term goals. Overall, she  feels 60% improved since the start of therapy, reporting near resolution of her Rt thigh pain/weakness. She does have continued pain in the Rt buttock region that is reproduced with passive straight leg raise and neural testing. In addition, she has some remaining lumbar/thoracic hypomobility and tenderness. Her LE strength is improved, and she is now able to maintain single leg stance without trendelenburg deviation or LOB. Her squat technique has significantly improved as well, with minimal valgus deviation and pain without support. Pt would benefit from extension of skilled PT to further decrease her pain, improve lumbar mobility, LE strength and address mechanics for safe lifting and help facilitate her independence with a program at home/gym.     Rehab Potential  Good    PT Frequency  2x / week    PT Duration  4 weeks    PT Treatment/Interventions  ADLs/Self Care Home Management;Electrical Stimulation;Moist Heat;Traction;Balance training;Therapeutic exercise;Therapeutic activities;Functional mobility training;Neuromuscular re-education;Patient/family education;Passive range of motion;Manual techniques;Dry needling;Taping    PT Next Visit Plan  standing hip ext/abd strengthening; lifting technique; lumbar mobs; tennis ball placement; soft tissue techniques to Rt ITB/quad/glute as needed    PT Home Exercise Plan  thoracic rotation, tibial IR mobilization, supine hip extension isometric, sit to stand, ankle stretch     Consulted and Agree with Plan of Care  Patient       Patient will benefit from skilled therapeutic intervention in order to improve the following deficits and impairments:  Decreased balance, Impaired flexibility, Hypomobility, Decreased strength, Decreased endurance, Decreased activity tolerance, Decreased mobility, Increased muscle spasms, Postural dysfunction, Pain,  Improper body mechanics  Visit Diagnosis: Muscle weakness (generalized)  Chronic right-sided low back pain, with sciatica presence unspecified  Other muscle spasm     Problem List Patient Active Problem List   Diagnosis Date Noted  . 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 Breast cancer of upper-outer quadrant of left female breast ([place]) 12/07/2014  . Acute recurrent sinusitis 01/17/2013    11:33 AM,05/17/17 Sherol Dade PT, DPT [facility] [address], [place], [place], [address] Phone: [phone]   Fax:  [phone]  Name: Christina [name] MRN: [id number] Date of Birth: Jun 28, 1950

## 2017-05-22 ENCOUNTER — Encounter: Payer: Medicare Other | Admitting: Physical Therapy

## 2017-05-24 ENCOUNTER — Ambulatory Visit: Payer: Medicare Other | Admitting: Physical Therapy

## 2017-05-24 ENCOUNTER — Encounter: Payer: Self-pay | Admitting: Physical Therapy

## 2017-05-24 DIAGNOSIS — M6281 Muscle weakness (generalized): Secondary | ICD-10-CM | POA: Diagnosis not present

## 2017-05-24 DIAGNOSIS — G8929 Other chronic pain: Secondary | ICD-10-CM

## 2017-05-24 DIAGNOSIS — M62838 Other muscle spasm: Secondary | ICD-10-CM

## 2017-05-24 DIAGNOSIS — M545 Low back pain: Secondary | ICD-10-CM

## 2017-05-24 NOTE — Therapy (Signed)
[facility] [address], [place], [place], [address] Phone: [phone]   Fax:  [phone]  Physical Therapy Treatment  Patient Details  Name: Christina [name] MRN: [id number] Date of Birth: [birth year] Referring Provider: Hulan Saas, DO   Encounter Date: 05/24/2017  PT End of Session - 05/24/17 0943    Visit Number  23    Date for PT Re-Evaluation  06/15/17    Authorization Type  Medicare A and B (G-codes completed on 05/19/22 visit)    Authorization Time Period  04/05/17 to 05/16/17, NEW 05/17/17 to 06/15/17    PT Start Time  0919    PT Stop Time  1005    PT Time Calculation (min)  46 min    Activity Tolerance  Patient tolerated treatment well;No increased pain    Behavior During Therapy  WFL for tasks assessed/performed       Past Medical History:  Diagnosis Date  . Abrasion of right leg 12/17/2014  . Adrenal insufficiency ([place])   . Breast cancer of upper-outer quadrant of left female breast ([place]) 12/07/2014  . Cataract, immature    bilateral  . Dental bridge present    upper - x 2  . Dental crowns present   . Hypothyroidism   . Prediabetes     Past Surgical History:  Procedure Laterality Date  . ABDOMINOPLASTY    . BALLOON SINUPLASTY    . BREAST LUMPECTOMY WITH RADIOACTIVE SEED AND SENTINEL LYMPH NODE BIOPSY Left 12/22/2014   Procedure: BREAST LUMPECTOMY WITH RADIOACTIVE SEED AND SENTINEL LYMPH NODE BIOPSY;  Surgeon: Stark Klein, MD;  Location: [facility];  Service: General;  Laterality: Left;  . BUNIONECTOMY Right   . HAMMER TOE SURGERY Bilateral   . RHINOPLASTY    . SEPTOPLASTY      There were no vitals filed for this visit.  Subjective Assessment - 05/24/17 0922    Subjective  Pt reports that things were good this past week and she was able to ride without pain following last session. She did have some pain, but it was not nearly as bad or as intense as she has had in the past. She was  completing her HEP without any difficulty. No pain currently.    Currently in Pain?  No/denies    Pain Onset  More than a month ago                      [facility] Adult PT Treatment/Exercise - 05/24/17 0001      Self-Care   Self-Care  Other Self-Care Comments    Other Self-Care Comments   set up of tennis ball to more of the side of her hip      Lumbar Exercises: Standing   Other Standing Lumbar Exercises  standing NBOS on foam pad with diagonal shoulder/hip tap (yellow weighted ball x10 reps each    Other Standing Lumbar Exercises  standing tandem on firm surface x30 sec each, hold with each LE forward and shoulder taps with yellow weighted ball x10 reps      Lumbar Exercises: Supine   Straight Leg Raise  10 reps;Limitations      Knee/Hip Exercises: Standing   Heel Raises  Right;Left;2 sets;10 reps;Limitations    Heel Raises Limitations  single leg     Hip Extension  Both;1 set;10 reps;Knee straight    Extension Limitations  forearms resting on counter without trunk rotation      Knee/Hip Exercises:  Supine   Short Arc Quad Sets  Both;15 reps;1 set;Strengthening;Limitations    Short Arc Quad Sets Limitations  3#    Bridges  Both;1 set;Strengthening;10 reps      Knee/Hip Exercises: Sidelying   Hip ABduction  Both;1 set;10 reps;Strengthening    Hip ADduction  Both;1 set;10 reps;Strengthening             PT Education - 05/24/17 0943    Education provided  Yes    Education Details  updated HEP    Person(s) Educated  Patient    Methods  Explanation;Handout    Comprehension  Verbalized understanding;Returned demonstration       PT Short Term Goals - 05/17/17 1039      PT SHORT TERM GOAL #1   Title  Pt will demo consistency and independence with her HEP to decrease pain and improve BLE strength.     Time  3    Period  Weeks    Status  Achieved      PT SHORT TERM GOAL #2   Title  Pt will report atleast 25% improvement in Rt buttock pain to allow for  return to daily brisk walking routine without significant increase in symptoms.     Baseline  60% improved     Time  3    Period  Weeks    Status  Achieved      PT SHORT TERM GOAL #3   Title  Pt will demonstrate Rt tibial inversion to atleast 20 degrees to improve her mechanics with squatting and other closed chain activity.     Time  3    Period  Weeks    Status  On-going        PT Long Term Goals - 05/17/17 1045      PT LONG TERM GOAL #1   Title  Pt will demo improved BLE strength to 5/5 MMT which will increase her safety with daily activity.     Baseline  up to 4/5 and 4+/5 MMT     Time  4    Period  Weeks    Status  Partially Met    Target Date  06/14/17      PT LONG TERM GOAL #2   Title  Pt will report being able to return to horse riding atleast 3x/week without difficulty to improve her quality of life and participation in activity with her peers.     Baseline  1x/week is ideal in the winter time     Time  4    Period  Weeks    Status  Deferred      PT LONG TERM GOAL #3   Title  Pt will report atleast 75% improvement in her symptoms to allow for her to resume all prior activities without difficulty.     Baseline  60% improved     Time  6    Period  Weeks    Status  Partially Met      PT LONG TERM GOAL #4   Title  Pt will demo improved hip stability evident by her ability to maintain single leg balance without LOB or (+) trendelenburg deviation for atleast 10 sec, 2/3 trials.     Baseline  met on the Rt     Time  4    Period  Weeks    Status  Achieved      PT LONG TERM GOAL #5   Title  Pt will demo proper BLE squat technique without  Rt knee valgus deviation x5 reps.     Baseline  7/10 reps without valgus     Time  6    Period  Weeks    Status  Achieved      Additional Long Term Goals   Additional Long Term Goals  Yes      PT LONG TERM GOAL #6   Title  Pt will lift atleast 10lb box from the floor, 3/5 trials, to decrease strain on her low back and allow  her to bring a case of water into her house.     Time  4    Period  Weeks    Status  New            Plan - 05/24/17 1003    Clinical Impression Statement  Pt arrived today without any reports of increase in pain this past week. Session focused on establishing a solid strengthening program for home, and pt was able to complete all activities this session with reports of increase in muscle fatigue only. No low back/buttock pain was reported during or following today's session. Will continue with current POC to progress LE/trunk strength and stability for return to daily and leisure activity.    Rehab Potential  Good    PT Frequency  2x / week    PT Duration  4 weeks    PT Treatment/Interventions  ADLs/Self Care Home Management;Electrical Stimulation;Moist Heat;Traction;Balance training;Therapeutic exercise;Therapeutic activities;Functional mobility training;Neuromuscular re-education;Patient/family education;Passive range of motion;Manual techniques;Dry needling;Taping    PT Next Visit Plan  standing hip ext/abd strengthening; lifting technique; lumbar mobs gentle; f/u on tennis ball placement; soft tissue techniques to Rt ITB/quad/glute as needed    PT Home Exercise Plan  bridge, SLR, side hip abd    Consulted and Agree with Plan of Care  Patient       Patient will benefit from skilled therapeutic intervention in order to improve the following deficits and impairments:  Decreased balance, Impaired flexibility, Hypomobility, Decreased strength, Decreased endurance, Decreased activity tolerance, Decreased mobility, Increased muscle spasms, Postural dysfunction, Pain, Improper body mechanics  Visit Diagnosis: Muscle weakness (generalized)  Chronic right-sided low back pain, with sciatica presence unspecified  Other muscle spasm     Problem List Patient Active Problem List   Diagnosis Date Noted  . 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 Breast cancer of upper-outer  quadrant of left female breast ([place]) 12/07/2014  . Acute recurrent sinusitis 01/17/2013    10:09 AM,05/24/17 Sherol Dade PT, DPT [facility] [address] [place], [place], [address] Phone: [phone]   Fax:  [phone]  Name: OSSIE YEBRA MRN: [id number] Date of Birth: March 25, 1951

## 2017-05-24 NOTE — Patient Instructions (Addendum)
   STRAIGHT LEG RAISE - SLR  While lying on your back, raise up your leg with a straight knee.  Keep the opposite knee bent with the foot planted on the ground.  x10-15 reps each. Work up to 2 sets of [place]  While lying on your side, slowly raise up your top leg to the side. Keep your knee straight and maintain your toes pointed forward the entire time. Keep your leg in-line with your body.  The bottom leg can be bent to stabilize your body.  x10 reps, work up to 2 sets of 10     BRIDGING  While lying on your back with knees bent, tighten your lower abdominals, squeeze your buttocks and then raise your buttocks off the floor/bed as creating a "Bridge" with your body. Hold and then lower yourself and repeat. x10 reps, work up to 2 sets of 10 reps.    [facility] [address] [place], [place] [address] Phone # [phone] Fax [phone]

## 2017-05-28 ENCOUNTER — Ambulatory Visit: Payer: Medicare Other | Admitting: Physical Therapy

## 2017-05-28 ENCOUNTER — Encounter: Payer: Self-pay | Admitting: Physical Therapy

## 2017-05-28 DIAGNOSIS — G8929 Other chronic pain: Secondary | ICD-10-CM

## 2017-05-28 DIAGNOSIS — M62838 Other muscle spasm: Secondary | ICD-10-CM

## 2017-05-28 DIAGNOSIS — M545 Low back pain: Secondary | ICD-10-CM | POA: Diagnosis not present

## 2017-05-28 DIAGNOSIS — M6281 Muscle weakness (generalized): Secondary | ICD-10-CM | POA: Diagnosis not present

## 2017-05-28 NOTE — Therapy (Signed)
[facility] [address] [place], [place], [address] Phone: [phone]   Fax:  [phone]  Physical Therapy Treatment  Patient Details  Name: Christina [name] MRN: [id number] Date of Birth: 1951/04/01 Referring Provider: Hulan Saas, DO   Encounter Date: 05/28/2017  PT End of Session - 05/28/17 0927    Visit Number  24    Date for PT Re-Evaluation  06/15/17    Authorization Type  Medicare A and B (G-codes completed on 07/01/22 visit)    Authorization Time Period  04/05/17 to 05/16/17, NEW 05/17/17 to 06/15/17    PT Start Time  0845    PT Stop Time  0928    PT Time Calculation (min)  43 min    Activity Tolerance  Patient tolerated treatment well;No increased pain    Behavior During Therapy  WFL for tasks assessed/performed       Past Medical History:  Diagnosis Date  . Abrasion of right leg 12/17/2014  . Adrenal insufficiency ([place])   . Breast cancer of upper-outer quadrant of left female breast ([place]) 12/07/2014  . Cataract, immature    bilateral  . Dental bridge present    upper - x 2  . Dental crowns present   . Hypothyroidism   . Prediabetes     Past Surgical History:  Procedure Laterality Date  . ABDOMINOPLASTY    . BALLOON SINUPLASTY    . BREAST LUMPECTOMY WITH RADIOACTIVE SEED AND SENTINEL LYMPH NODE BIOPSY Left 12/22/2014   Procedure: BREAST LUMPECTOMY WITH RADIOACTIVE SEED AND SENTINEL LYMPH NODE BIOPSY;  Surgeon: Stark Klein, MD;  Location: [place];  Service: General;  Laterality: Left;  . BUNIONECTOMY Right   . HAMMER TOE SURGERY Bilateral   . RHINOPLASTY    . SEPTOPLASTY      There were no vitals filed for this visit.  Subjective Assessment - 05/28/17 0850    Subjective  Pt reports that she did ok after her session. She could tell her legs were tired, and her soreness has improved gradually over the past couple of days. She was unable to do all of her exercises.     Currently in  Pain?  Yes    Pain Score  4     Pain Location  Buttocks    Pain Orientation  Right    Pain Descriptors / Indicators  Aching;Sore    Pain Type  Chronic pain    Pain Radiating Towards  none    Pain Onset  More than a month ago    Pain Frequency  Intermittent    Aggravating Factors   alot of activity    Pain Relieving Factors  tennis ball is helping some, walking helped with soreness                       [facility] Adult PT Treatment/Exercise - 05/28/17 0001      Self-Care   Other Self-Care Comments   towel roll for additional lumbar support       Lumbar Exercises: Stretches   Double Knee to Chest Stretch  5 reps;20 seconds    Lower Trunk Rotation  10 seconds    Lower Trunk Rotation Limitations  RLE only, x10 reps       Lumbar Exercises: Seated   Other Seated Lumbar Exercises  Rt nerve flossing x10 reps.       Lumbar Exercises: Supine   Other Supine Lumbar Exercises  hip abduction/adduction slide  2x10 reps on the Rt, x10 reps on the Lt       Lumbar Exercises: Sidelying   Other Sidelying Lumbar Exercises  thoracic rotation Lt/Rt x20 reps each       Lumbar Exercises: Prone   Other Prone Lumbar Exercises  prone press up on hands x20 reps       Knee/Hip Exercises: Standing   Heel Raises  Both;2 sets;10 reps    Heel Raises Limitations  single leg     Hip Extension  Both;1 set;10 reps    Extension Limitations  forearms resting on counter without trunk rotation             PT Education - 05/28/17 0913    Education provided  Yes    Education Details  adjustments to HEP to allow continued adherence during periods of muscle soreness; difference between tight muscle and neural tension.    Person(s) Educated  Patient    Methods  Explanation;Handout    Comprehension  Verbalized understanding;Returned demonstration       PT Short Term Goals - 05/17/17 1039      PT SHORT TERM GOAL #1   Title  Pt will demo consistency and independence with her HEP to decrease pain  and improve BLE strength.     Time  3    Period  Weeks    Status  Achieved      PT SHORT TERM GOAL #2   Title  Pt will report atleast 25% improvement in Rt buttock pain to allow for return to daily brisk walking routine without significant increase in symptoms.     Baseline  60% improved     Time  3    Period  Weeks    Status  Achieved      PT SHORT TERM GOAL #3   Title  Pt will demonstrate Rt tibial inversion to atleast 20 degrees to improve her mechanics with squatting and other closed chain activity.     Time  3    Period  Weeks    Status  On-going        PT Long Term Goals - 05/17/17 1045      PT LONG TERM GOAL #1   Title  Pt will demo improved BLE strength to 5/5 MMT which will increase her safety with daily activity.     Baseline  up to 4/5 and 4+/5 MMT     Time  4    Period  Weeks    Status  Partially Met    Target Date  06/14/17      PT LONG TERM GOAL #2   Title  Pt will report being able to return to horse riding atleast 3x/week without difficulty to improve her quality of life and participation in activity with her peers.     Baseline  1x/week is ideal in the winter time     Time  4    Period  Weeks    Status  Deferred      PT LONG TERM GOAL #3   Title  Pt will report atleast 75% improvement in her symptoms to allow for her to resume all prior activities without difficulty.     Baseline  60% improved     Time  6    Period  Weeks    Status  Partially Met      PT LONG TERM GOAL #4   Title  Pt will demo improved hip stability evident by her ability to  maintain single leg balance without LOB or (+) trendelenburg deviation for atleast 10 sec, 2/3 trials.     Baseline  met on the Rt     Time  4    Period  Weeks    Status  Achieved      PT LONG TERM GOAL #5   Title  Pt will demo proper BLE squat technique without Rt knee valgus deviation x5 reps.     Baseline  7/10 reps without valgus     Time  6    Period  Weeks    Status  Achieved      Additional Long  Term Goals   Additional Long Term Goals  Yes      PT LONG TERM GOAL #6   Title  Pt will lift atleast 10lb box from the floor, 3/5 trials, to decrease strain on her low back and allow her to bring a case of water into her house.     Time  4    Period  Weeks    Status  New            Plan - 05/28/17 6004    Clinical Impression Statement  Pt arrived with 4/10 soreness in her legs following her last session. Completed gentle hip strengthening without too much progression to allow pt to develop better understanding of therex adjustments for good adherence at home. Introduced seated nerve glides this session and educated pt on use/set up of lumbar towel roll for additional back support at home. End of session pt reported decrease in her soreness to 2/10.     Rehab Potential  Good    PT Frequency  2x / week    PT Duration  4 weeks    PT Treatment/Interventions  ADLs/Self Care Home Management;Electrical Stimulation;Moist Heat;Traction;Balance training;Therapeutic exercise;Therapeutic activities;Functional mobility training;Neuromuscular re-education;Patient/family education;Passive range of motion;Manual techniques;Dry needling;Taping    PT Next Visit Plan  standing hip ext/abd strengthening progression; continue seated nerve glides and provide for home if able; lifting technique; lumbar mobs gentle; soft tissue techniques to Rt ITB/quad/glute as needed    PT Home Exercise Plan  bridge, SLR, side hip abd (supine/standing hip abd for when needed)    Consulted and Agree with Plan of Care  Patient       Patient will benefit from skilled therapeutic intervention in order to improve the following deficits and impairments:  Decreased balance, Impaired flexibility, Hypomobility, Decreased strength, Decreased endurance, Decreased activity tolerance, Decreased mobility, Increased muscle spasms, Postural dysfunction, Pain, Improper body mechanics  Visit Diagnosis: Muscle weakness  (generalized)  Chronic right-sided low back pain, with sciatica presence unspecified  Other muscle spasm     Problem List Patient Active Problem List   Diagnosis Date Noted  . 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 Breast cancer of upper-outer quadrant of left female breast ([place]) 12/07/2014  . Acute recurrent sinusitis 01/17/2013    10:03 AM,05/28/17 Sherol Dade PT, DPT [facility] [address] [place], [place], [address] Phone: [phone]   Fax:  [phone]  Name: NAVREET BOLDA MRN: [id number] Date of Birth: 14-Jul-1950

## 2017-05-28 NOTE — Patient Instructions (Signed)
For the 1-2 days if too sore to complete exercise on your side.   ELASTIC BAND - SUPINE HIP ABDUCTION  While lying on your back, slowly bring your leg out to the side. Keep  your knee straight the entire time.  x10 reps       Hip Abduction  Standing tall, lift one leg out to the side then return.  x10-15 reps.    [facility] [address] [place], [place] [address] Phone # [phone] Fax [phone]

## 2017-05-31 DIAGNOSIS — Z170 Estrogen receptor positive status [ER+]: Secondary | ICD-10-CM | POA: Diagnosis not present

## 2017-05-31 DIAGNOSIS — M545 Low back pain: Secondary | ICD-10-CM | POA: Diagnosis not present

## 2017-05-31 DIAGNOSIS — M8580 Other specified disorders of bone density and structure, unspecified site: Secondary | ICD-10-CM | POA: Diagnosis not present

## 2017-05-31 DIAGNOSIS — Z853 Personal history of malignant neoplasm of breast: Secondary | ICD-10-CM | POA: Diagnosis not present

## 2017-05-31 DIAGNOSIS — G4700 Insomnia, unspecified: Secondary | ICD-10-CM | POA: Diagnosis not present

## 2017-06-01 ENCOUNTER — Encounter: Payer: Self-pay | Admitting: Physical Therapy

## 2017-06-01 ENCOUNTER — Ambulatory Visit: Payer: Medicare Other | Attending: Family Medicine | Admitting: Physical Therapy

## 2017-06-01 DIAGNOSIS — M545 Low back pain: Secondary | ICD-10-CM | POA: Insufficient documentation

## 2017-06-01 DIAGNOSIS — M62838 Other muscle spasm: Secondary | ICD-10-CM | POA: Diagnosis not present

## 2017-06-01 DIAGNOSIS — G8929 Other chronic pain: Secondary | ICD-10-CM | POA: Diagnosis not present

## 2017-06-01 DIAGNOSIS — M6281 Muscle weakness (generalized): Secondary | ICD-10-CM | POA: Diagnosis not present

## 2017-06-01 NOTE — Patient Instructions (Signed)
    Sciatic Nerve Flossing  At the same time, straighten your knee, point your toes up, and move your head as if you were looking up. Return back to the starting position by bending your knee, point your toe down, and bring your head down towards your chest. It is important to not experience any symptoms as you perform this exercise.  [facility] reps     [facility] [address] [place], [place] [address] Phone # [phone] Fax [phone]

## 2017-06-01 NOTE — Therapy (Signed)
[facility] [address] [place], [place], [address] Phone: [phone]   Fax:  [phone]  Physical Therapy Treatment  Patient Details  Name: Christina [name] MRN: [id number] Date of Birth: [birth year] Referring Provider: Hulan Saas, DO   Encounter Date: 06/01/2017  PT End of Session - 06/01/17 0856    Visit Number  25    Date for PT Re-Evaluation  06/15/17    Authorization Type  Medicare A and B (G-codes completed on 07-18-2022 visit)    Authorization Time Period  04/05/17 to 05/16/17, NEW 05/17/17 to 06/15/17    PT Start Time  0844    PT Stop Time  0923    PT Time Calculation (min)  39 min    Activity Tolerance  Patient tolerated treatment well;No increased pain    Behavior During Therapy  WFL for tasks assessed/performed       Past Medical History:  Diagnosis Date  . Abrasion of right leg 12/17/2014  . Adrenal insufficiency ([place])   . Breast cancer of upper-outer quadrant of left female breast ([place]) 12/07/2014  . Cataract, immature    bilateral  . Dental bridge present    upper - x 2  . Dental crowns present   . Hypothyroidism   . Prediabetes     Past Surgical History:  Procedure Laterality Date  . ABDOMINOPLASTY    . BALLOON SINUPLASTY    . BREAST LUMPECTOMY WITH RADIOACTIVE SEED AND SENTINEL LYMPH NODE BIOPSY Left 12/22/2014   Procedure: BREAST LUMPECTOMY WITH RADIOACTIVE SEED AND SENTINEL LYMPH NODE BIOPSY;  Surgeon: Stark Klein, MD;  Location: [place];  Service: General;  Laterality: Left;  . BUNIONECTOMY Right   . HAMMER TOE SURGERY Bilateral   . RHINOPLASTY    . SEPTOPLASTY      There were no vitals filed for this visit.  Subjective Assessment - 06/01/17 0847    Subjective  Pt reports that she was good after Monday's session, but was sore the day following. She has been working on using her exercises and adjusting them as needed, depending on how sore she is.     Currently in Pain?   No/denies    Pain Onset  More than a month ago                      [facility] Adult PT Treatment/Exercise - 06/01/17 0001      Lumbar Exercises: Stretches   Lower Trunk Rotation  3 reps;20 seconds    Lower Trunk Rotation Limitations  Lt/Rt    Piriformis Stretch  Right;2 reps;30 seconds;Other (comment) supine       Lumbar Exercises: Aerobic   Stationary Bike  Nustep L2 x5 min PT present to discuss HEP       Lumbar Exercises: Standing   Other Standing Lumbar Exercises  NBOS on foam with diagonal shoulder/hip tap x15 reps each with yellow weighted ball       Lumbar Exercises: Seated   Hip Flexion on Ball  Both;10 reps;Other (comment) 1 UE support     Other Seated Lumbar Exercises  Rt nerve flossing x15 reps     Other Seated Lumbar Exercises  anterior and posterior pelvic tilts on red physioball       Knee/Hip Exercises: Standing   Hip Extension  Both;1 set;15 reps;Knee straight    Extension Limitations  forearms resting on counter without trunk rotation      Knee/Hip Exercises: Supine  Short Arc [employer]  1 set;15 reps    Short Arc Quad Sets Limitations  4#      Knee/Hip Exercises: Sidelying   Hip ABduction  Both;1 set;10 reps;Strengthening 1# ankle weight             PT Education - 06/01/17 [phone]    Education provided  Yes    Education Details  discussed scheduling horse back riding on other days from PT; added nerve flossing    Person(s) Educated  Patient    Methods  Explanation;Handout    Comprehension  Verbalized understanding;Returned demonstration       PT Short Term Goals - 05/17/17 1039      PT SHORT TERM GOAL #1   Title  Pt will demo consistency and independence with her HEP to decrease pain and improve BLE strength.     Time  3    Period  Weeks    Status  Achieved      PT SHORT TERM GOAL #2   Title  Pt will report atleast 25% improvement in Rt buttock pain to allow for return to daily brisk walking routine without significant increase in  symptoms.     Baseline  60% improved     Time  3    Period  Weeks    Status  Achieved      PT SHORT TERM GOAL #3   Title  Pt will demonstrate Rt tibial inversion to atleast 20 degrees to improve her mechanics with squatting and other closed chain activity.     Time  3    Period  Weeks    Status  On-going        PT Long Term Goals - 05/17/17 1045      PT LONG TERM GOAL #1   Title  Pt will demo improved BLE strength to 5/5 MMT which will increase her safety with daily activity.     Baseline  up to 4/5 and 4+/5 MMT     Time  4    Period  Weeks    Status  Partially Met    Target Date  06/14/17      PT LONG TERM GOAL #2   Title  Pt will report being able to return to horse riding atleast 3x/week without difficulty to improve her quality of life and participation in activity with her peers.     Baseline  1x/week is ideal in the winter time     Time  4    Period  Weeks    Status  Deferred      PT LONG TERM GOAL #3   Title  Pt will report atleast 75% improvement in her symptoms to allow for her to resume all prior activities without difficulty.     Baseline  60% improved     Time  6    Period  Weeks    Status  Partially Met      PT LONG TERM GOAL #4   Title  Pt will demo improved hip stability evident by her ability to maintain single leg balance without LOB or (+) trendelenburg deviation for atleast 10 sec, 2/3 trials.     Baseline  met on the Rt     Time  4    Period  Weeks    Status  Achieved      PT LONG TERM GOAL #5   Title  Pt will demo proper BLE squat technique without Rt knee valgus deviation x5 reps.  Baseline  7/10 reps without valgus     Time  6    Period  Weeks    Status  Achieved      Additional Long Term Goals   Additional Long Term Goals  Yes      PT LONG TERM GOAL #6   Title  Pt will lift atleast 10lb box from the floor, 3/5 trials, to decrease strain on her low back and allow her to bring a case of water into her house.     Time  4    Period   Weeks    Status  New            Plan - 06/01/17 0919    Clinical Impression Statement  Pt arrived today reporting increased HEP adherence and minimal complaints of Rt buttock/groin pain since her last session. Pt demonstrates good understanding of exercise adjustments depending on her level of soreness, which is increasing her adherence and overall activity throughout the day. Pt was able to complete increases in resistance with sidelying hip abduction and increase in reps with all other exercises completed this session. Ended with hip stretches to alleviate feelings of muscle fatigue. Will continue with current POC.    Rehab Potential  Good    PT Frequency  2x / week    PT Duration  4 weeks    PT Treatment/Interventions  ADLs/Self Care Home Management;Electrical Stimulation;Moist Heat;Traction;Balance training;Therapeutic exercise;Therapeutic activities;Functional mobility training;Neuromuscular re-education;Patient/family education;Passive range of motion;Manual techniques;Dry needling;Taping    PT Next Visit Plan  standing hip ext/abd strengthening progression; continue seated nerve glides and provide for home if able; lifting technique; lumbar mobs gentle; soft tissue techniques to Rt ITB/quad/glute as needed    PT Home Exercise Plan  bridge, SLR, side hip abd (supine/standing hip abd for when needed), seated nerve flossing     Consulted and Agree with Plan of Care  Patient       Patient will benefit from skilled therapeutic intervention in order to improve the following deficits and impairments:  Decreased balance, Impaired flexibility, Hypomobility, Decreased strength, Decreased endurance, Decreased activity tolerance, Decreased mobility, Increased muscle spasms, Postural dysfunction, Pain, Improper body mechanics  Visit Diagnosis: Muscle weakness (generalized)  Chronic right-sided low back pain, with sciatica presence unspecified  Other muscle spasm     Problem List Patient  Active Problem List   Diagnosis Date Noted  . 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 Breast cancer of upper-outer quadrant of left female breast ([place]) 12/07/2014  . Acute recurrent sinusitis 01/17/2013    9:25 AM,06/01/17 Sherol Dade PT, DPT [facility] [address] [place], [place], [address] Phone: [phone]   Fax:  [phone]  Name: Christina [name] MRN: [id number] Date of Birth: [birth year]

## 2017-06-03 NOTE — Progress Notes (Deleted)
[name] [name], [place] [address] Phone: [phone] Subjective:    I'm seeing this patient by the request  of:    CC: Low back pain follow-up  IDP:[id number]  Christina [name] is a 67 y.o. female coming in with complaint of low back pain.  Found to have an L3 nerve root impingement noted on MRI that was independently visualized by me.  Patient's pain was waxing and waning.  Seem to be getting worse.  Last epidural injection January 11, 2017.  Sent to formal physical therapy and has been doing that religiously over the course the last 6 weeks. Patient states    Past Medical History:  Diagnosis Date  . Abrasion of right leg 12/17/2014  . Adrenal insufficiency ([place])   . Breast cancer of upper-outer quadrant of left female breast ([name]) 12/07/2014  . Cataract, immature    bilateral  . Dental bridge present    upper - x 2  . Dental crowns present   . Hypothyroidism   . Prediabetes    Past Surgical History:  Procedure Laterality Date  . ABDOMINOPLASTY    . BALLOON SINUPLASTY    . BREAST LUMPECTOMY WITH RADIOACTIVE SEED AND SENTINEL LYMPH NODE BIOPSY Left 12/22/2014   Procedure: BREAST LUMPECTOMY WITH RADIOACTIVE SEED AND SENTINEL LYMPH NODE BIOPSY;  Surgeon: Stark Klein, MD;  Location: [place];  Service: General;  Laterality: Left;  . BUNIONECTOMY Right   . HAMMER TOE SURGERY Bilateral   . RHINOPLASTY    . SEPTOPLASTY     Social History   Socioeconomic History  . Marital status: Widowed    Spouse name: Not on file  . Number of children: Not on file  . Years of education: Not on file  . Highest education level: Not on file  Social Needs  . Financial resource strain: Not on file  . Food insecurity - worry: Not on file  . Food insecurity - inability: Not on file  . Transportation needs - medical: Not on file  . Transportation needs - non-medical: Not on file  Occupational History    Employer: [employer]   Tobacco Use  . Smoking status: Former Smoker    Packs/day: 0.00    Years: 0.00    Pack years: 0.00    Last attempt to quit: 05/01/1980    Years since quitting: 37.1  . Smokeless tobacco: Never Used  Substance and Sexual Activity  . Alcohol use: Yes    Alcohol/week: 0.0 oz    Comment: occasionally  . Drug use: No  . Sexual activity: Not on file  Other Topics Concern  . Not on file  Social History Narrative  . Not on file   Allergies  Allergen Reactions  . Celery Oil Other (See Comments)    FEVER BLISTERS  . Doxycycline Nausea And Vomiting  . Rye Grass Flower Pollen Extract [Gramineae Pollens] Swelling    RYE:  SWELLING THROAT - DENIES SOB  . Tea Swelling    THROAT - DENIES SOB  . Adhesive [Tape] Other (See Comments)    TEARS SKIN  . Sulfa Antibiotics Swelling    NOSE   Family History  Problem Relation Age of Onset  . Diabetes Mother   . Diabetes Father   . Cancer Maternal Uncle        pancreatic cancer   . Diabetes Paternal Grandmother   . Diabetes Paternal Grandfather      Past medical history, social,  surgical and family history all reviewed in electronic medical record.  No pertanent information unless stated regarding to the chief complaint.   Review of Systems:Review of systems updated and as accurate as of 06/03/17  No headache, visual changes, nausea, vomiting, diarrhea, constipation, dizziness, abdominal pain, skin rash, fevers, chills, night sweats, weight loss, swollen lymph nodes, body aches, joint swelling, muscle aches, chest pain, shortness of breath, mood changes.   Objective  There were no vitals taken for this visit. Systems examined below as of 06/03/17   General: No apparent distress alert and oriented x3 mood and affect normal, dressed appropriately.  HEENT: Pupils equal, extraocular movements intact  Respiratory: Patient's speak in full sentences and does not appear short of breath  Cardiovascular: No lower extremity edema, non tender, no  erythema  Skin: Warm dry intact with no signs of infection or rash on extremities or on axial skeleton.  Abdomen: Soft nontender  Neuro: Cranial nerves II through XII are intact, neurovascularly intact in all extremities with 2+ DTRs and 2+ pulses.  Lymph: No lymphadenopathy of posterior or anterior cervical chain or axillae bilaterally.  Gait normal with good balance and coordination.  MSK:  Non tender with full range of motion and good stability and symmetric strength and tone of shoulders, elbows, wrist, hip, knee and ankles bilaterally.     Impression and Recommendations:     This case required medical decision making of moderate complexity.      Note: This dictation was prepared with Dragon dictation along with smaller phrase technology. Any transcriptional errors that result from this process are unintentional.

## 2017-06-04 ENCOUNTER — Ambulatory Visit: Payer: Medicare Other | Admitting: Family Medicine

## 2017-06-04 ENCOUNTER — Ambulatory Visit: Payer: Medicare Other | Admitting: Physical Therapy

## 2017-06-04 ENCOUNTER — Encounter: Payer: Self-pay | Admitting: Physical Therapy

## 2017-06-04 DIAGNOSIS — M545 Low back pain: Secondary | ICD-10-CM | POA: Diagnosis not present

## 2017-06-04 DIAGNOSIS — M6281 Muscle weakness (generalized): Secondary | ICD-10-CM

## 2017-06-04 DIAGNOSIS — M62838 Other muscle spasm: Secondary | ICD-10-CM | POA: Diagnosis not present

## 2017-06-04 DIAGNOSIS — G8929 Other chronic pain: Secondary | ICD-10-CM | POA: Diagnosis not present

## 2017-06-04 NOTE — Therapy (Signed)
[facility] [address], [place], [place], [address] Phone: [phone]   Fax:  [phone]  Physical Therapy Treatment  Patient Details  Name: Christina [name] MRN: [id number] Date of Birth: Feb 15, 1951 Referring Provider: Hulan Saas, DO   Encounter Date: 06/04/2017  PT End of Session - 06/04/17 1150    Visit Number  26    Date for PT Re-Evaluation  06/15/17    Authorization Type  Medicare A and B (G-codes completed on 2022/06/16 visit)    Authorization Time Period  04/05/17 to 05/16/17, NEW 05/17/17 to 06/15/17    PT Start Time  1145    PT Stop Time  1225    PT Time Calculation (min)  40 min    Activity Tolerance  Patient tolerated treatment well;No increased pain    Behavior During Therapy  WFL for tasks assessed/performed       Past Medical History:  Diagnosis Date  . Abrasion of right leg 12/17/2014  . Adrenal insufficiency ([place])   . Breast cancer of upper-outer quadrant of left female breast ([place]) 12/07/2014  . Cataract, immature    bilateral  . Dental bridge present    upper - x 2  . Dental crowns present   . Hypothyroidism   . Prediabetes     Past Surgical History:  Procedure Laterality Date  . ABDOMINOPLASTY    . BALLOON SINUPLASTY    . BREAST LUMPECTOMY WITH RADIOACTIVE SEED AND SENTINEL LYMPH NODE BIOPSY Left 12/22/2014   Procedure: BREAST LUMPECTOMY WITH RADIOACTIVE SEED AND SENTINEL LYMPH NODE BIOPSY;  Surgeon: Stark Klein, MD;  Location: [facility];  Service: General;  Laterality: Left;  . BUNIONECTOMY Right   . HAMMER TOE SURGERY Bilateral   . RHINOPLASTY    . SEPTOPLASTY      There were no vitals filed for this visit.  Subjective Assessment - 06/04/17 1147    Subjective  Pt reports that she was good after riding, but the next morning she was sore. She tried her exercise, but felt that it wasn't working because she was still just as tight has she was to begin with.     Currently  in Pain?  No/denies no pain just tightness     Pain Onset  More than a month ago                      [facility] Adult PT Treatment/Exercise - 06/04/17 0001      Lumbar Exercises: Stretches   Hip Flexor Stretch  Left;Right;2 reps;30 seconds;Limitations    Hip Flexor Stretch Limitations  supine thomas test       Lumbar Exercises: Standing   Other Standing Lumbar Exercises  single leg partial march 2x10 reps each, standing on foam pad (mirror feedback)    Other Standing Lumbar Exercises  hip hike/lower from 4" box x10 reps each       Knee/Hip Exercises: Standing   SLS  3x20 sec each (12 sec Rt, 10-15 sec on Lt)       Knee/Hip Exercises: Supine   Other Supine Knee/Hip Exercises  single leg clams 2x10 reps each side, yellow TB       Knee/Hip Exercises: Sidelying   Hip ABduction  2 sets;10 reps;Strengthening;Limitations    Hip ABduction Limitations  1# ankle weight       Manual Therapy   Soft tissue mobilization  STM Rt lateral hamstring/quadriceps  PT Education - 06/04/17 1200    Education provided  Yes    Education Details  process of increasing strength gradually pushing/increasing reps/sets and weight; ice parameters and adjustments to HEP/rolling     Person(s) Educated  Patient    Methods  Explanation    Comprehension  Verbalized understanding       PT Short Term Goals - 05/17/17 1039      PT SHORT TERM GOAL #1   Title  Pt will demo consistency and independence with her HEP to decrease pain and improve BLE strength.     Time  3    Period  Weeks    Status  Achieved      PT SHORT TERM GOAL #2   Title  Pt will report atleast 25% improvement in Rt buttock pain to allow for return to daily brisk walking routine without significant increase in symptoms.     Baseline  60% improved     Time  3    Period  Weeks    Status  Achieved      PT SHORT TERM GOAL #3   Title  Pt will demonstrate Rt tibial inversion to atleast 20 degrees to improve her  mechanics with squatting and other closed chain activity.     Time  3    Period  Weeks    Status  On-going        PT [name] Term Goals - 05/17/17 1045      PT [name] TERM GOAL #1   Title  Pt will demo improved BLE strength to 5/5 MMT which will increase her safety with daily activity.     Baseline  up to 4/5 and 4+/5 MMT     Time  4    Period  Weeks    Status  Partially Met    Target Date  06/14/17      PT [name] TERM GOAL #2   Title  Pt will report being able to return to horse riding atleast 3x/week without difficulty to improve her quality of life and participation in activity with her peers.     Baseline  1x/week is ideal in the winter time     Time  4    Period  Weeks    Status  Deferred      PT [name] TERM GOAL #3   Title  Pt will report atleast 75% improvement in her symptoms to allow for her to resume all prior activities without difficulty.     Baseline  60% improved     Time  6    Period  Weeks    Status  Partially Met      PT [name] TERM GOAL #4   Title  Pt will demo improved hip stability evident by her ability to maintain single leg balance without LOB or (+) trendelenburg deviation for atleast 10 sec, 2/3 trials.     Baseline  met on the Rt     Time  4    Period  Weeks    Status  Achieved      PT [name] TERM GOAL #5   Title  Pt will demo proper BLE squat technique without Rt knee valgus deviation x5 reps.     Baseline  7/10 reps without valgus     Time  6    Period  Weeks    Status  Achieved      Additional [name] Term Goals   Additional [name] Term Goals  Yes  PT [name] TERM GOAL #6   Title  Pt will lift atleast 10lb box from the floor, 3/5 trials, to decrease strain on her low back and allow her to bring a case of water into her house.     Time  4    Period  Weeks    Status  New            Plan - 06/04/17 1229    Clinical Impression Statement  Pt arrived with increased stiffness on the RLE/hip area, although no pain report at this time. Session  continued with focus on therex to improve hip strength, flexibility and stability. Pt was able to complete increased reps with several exercises, and she was able to maintain single leg balance for up to 12 sec on the RLE without LOB. Ended with soft tissue mobilization along the Rt lateral thigh region and made some adjustments to pt's HEP to further address this. Ended without report of increased pain.    Rehab Potential  Good    PT Frequency  2x / week    PT Duration  4 weeks    PT Treatment/Interventions  ADLs/Self Care Home Management;Electrical Stimulation;Moist Heat;Traction;Balance training;Therapeutic exercise;Therapeutic activities;Functional mobility training;Neuromuscular re-education;Patient/family education;Passive range of motion;Manual techniques;Dry needling;Taping    PT Next Visit Plan  f/u on rolling lateral thigh; standing hip ext/abd strengthening progression; lumbar mobs gentle; soft tissue techniques to Rt ITB/quad/glute    PT Home Exercise Plan  bridge, SLR, side hip abd (supine/standing hip abd for when needed), seated nerve flossing     Consulted and Agree with Plan of Care  Patient       Patient will benefit from skilled therapeutic intervention in order to improve the following deficits and impairments:  Decreased balance, Impaired flexibility, Hypomobility, Decreased strength, Decreased endurance, Decreased activity tolerance, Decreased mobility, Increased muscle spasms, Postural dysfunction, Pain, Improper body mechanics  Visit Diagnosis: Muscle weakness (generalized)  Chronic right-sided low back pain, with sciatica presence unspecified  Other muscle spasm     Problem List Patient Active Problem List   Diagnosis Date Noted  . 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 Breast cancer of upper-outer quadrant of left female breast ([place]) 12/07/2014  . Acute recurrent sinusitis 01/17/2013    12:41 PM,06/04/17 Sherol Dade PT, DPT [place] [facility] [address], [place], [place], [address] Phone: [phone]   Fax:  [phone]  Name: Christina [name] MRN: [id number] Date of Birth: June 17, 1950

## 2017-06-07 ENCOUNTER — Encounter: Payer: Self-pay | Admitting: Physical Therapy

## 2017-06-07 ENCOUNTER — Ambulatory Visit: Payer: Medicare Other | Admitting: Physical Therapy

## 2017-06-07 DIAGNOSIS — M6281 Muscle weakness (generalized): Secondary | ICD-10-CM

## 2017-06-07 DIAGNOSIS — G8929 Other chronic pain: Secondary | ICD-10-CM

## 2017-06-07 DIAGNOSIS — M545 Low back pain: Secondary | ICD-10-CM

## 2017-06-07 DIAGNOSIS — M62838 Other muscle spasm: Secondary | ICD-10-CM | POA: Diagnosis not present

## 2017-06-07 NOTE — Therapy (Signed)
[facility] [address] [place], [place], [address] Phone: [phone]   Fax:  [phone]  Physical Therapy Treatment  Patient Details  Name: Christina [name] MRN: [id number] Date of Birth: 09-Oct-1950 Referring Provider: Hulan Saas, DO   Encounter Date: 06/07/2017  PT End of Session - 06/07/17 1523    Visit Number  27    Date for PT Re-Evaluation  06/15/17    Authorization Type  Medicare A and B (G-codes completed on May 10, 2022 visit)    Authorization Time Period  04/05/17 to 05/16/17, NEW 05/17/17 to 06/15/17    PT Start Time  1435    PT Stop Time  1520    PT Time Calculation (min)  45 min    Activity Tolerance  Patient tolerated treatment well;No increased pain    Behavior During Therapy  WFL for tasks assessed/performed       Past Medical History:  Diagnosis Date  . Abrasion of right leg 12/17/2014  . Adrenal insufficiency ([place])   . Breast cancer of upper-outer quadrant of left female breast ([place]) 12/07/2014  . Cataract, immature    bilateral  . Dental bridge present    upper - x 2  . Dental crowns present   . Hypothyroidism   . Prediabetes     Past Surgical History:  Procedure Laterality Date  . ABDOMINOPLASTY    . BALLOON SINUPLASTY    . BREAST LUMPECTOMY WITH RADIOACTIVE SEED AND SENTINEL LYMPH NODE BIOPSY Left 12/22/2014   Procedure: BREAST LUMPECTOMY WITH RADIOACTIVE SEED AND SENTINEL LYMPH NODE BIOPSY;  Surgeon: Stark Klein, MD;  Location: [facility];  Service: General;  Laterality: Left;  . BUNIONECTOMY Right   . HAMMER TOE SURGERY Bilateral   . RHINOPLASTY    . SEPTOPLASTY      There were no vitals filed for this visit.  Subjective Assessment - 06/07/17 1435    Subjective  Pt reports that she now has sciatica. She noticed this on Wednesday during her walk. As soon as she quit walking it went away. She also notes that she has difficulty with stepping up onto high steps (over 6  inches).     Currently in Pain?  Yes    Pain Score  -- no rating given    Pain Location  -- posterior thigh     Pain Orientation  Right    Pain Descriptors / Indicators  Burning;Aching    Pain Type  Acute pain    Pain Radiating Towards  down to the back of the knee     Pain Onset  More than a month ago    Pain Frequency  Intermittent    Aggravating Factors   walking    Pain Relieving Factors  not walking     Effect of Pain on Daily Activities  uncomfortable walking              [facility] Adult PT Treatment/Exercise - 06/07/17 0001      Knee/Hip Exercises: Standing   Hip Extension  2 sets;10 reps;Knee straight    Extension Limitations  2# ankle weight     Step Down  Right;2 sets;10 reps;Step Height: 6";Step Height: 4";Limitations    Step Down Limitations  2nd set with 4" box     Other Standing Knee Exercises  side stepping over hurdle Lt/Rt x10 reps with BUE support     Other Standing Knee Exercises  Lt hip hike/lower from 4" box  Manual Therapy   Joint Mobilization  Grade III CPAs L4 to S1     Myofascial Release  TPR Rt QL             PT Education - 06/07/17 1522    Education Details  adjustments to ball massage placement at home to decrease irritation to the sciatic nerve; improvements in mobility and hip extension on the Rt    Person(s) Educated  Patient    Methods  Explanation    Comprehension  Verbalized understanding       PT Short Term Goals - 05/17/17 1039      PT SHORT TERM GOAL #1   Title  Pt will demo consistency and independence with her HEP to decrease pain and improve BLE strength.     Time  3    Period  Weeks    Status  Achieved      PT SHORT TERM GOAL #2   Title  Pt will report atleast 25% improvement in Rt buttock pain to allow for return to daily brisk walking routine without significant increase in symptoms.     Baseline  60% improved     Time  3    Period  Weeks    Status  Achieved      PT SHORT TERM GOAL #3   Title  Pt will  demonstrate Rt tibial inversion to atleast 20 degrees to improve her mechanics with squatting and other closed chain activity.     Time  3    Period  Weeks    Status  On-going        PT Long Term Goals - 05/17/17 1045      PT LONG TERM GOAL #1   Title  Pt will demo improved BLE strength to 5/5 MMT which will increase her safety with daily activity.     Baseline  up to 4/5 and 4+/5 MMT     Time  4    Period  Weeks    Status  Partially Met    Target Date  06/14/17      PT LONG TERM GOAL #2   Title  Pt will report being able to return to horse riding atleast 3x/week without difficulty to improve her quality of life and participation in activity with her peers.     Baseline  1x/week is ideal in the winter time     Time  4    Period  Weeks    Status  Deferred      PT LONG TERM GOAL #3   Title  Pt will report atleast 75% improvement in her symptoms to allow for her to resume all prior activities without difficulty.     Baseline  60% improved     Time  6    Period  Weeks    Status  Partially Met      PT LONG TERM GOAL #4   Title  Pt will demo improved hip stability evident by her ability to maintain single leg balance without LOB or (+) trendelenburg deviation for atleast 10 sec, 2/3 trials.     Baseline  met on the Rt     Time  4    Period  Weeks    Status  Achieved      PT LONG TERM GOAL #5   Title  Pt will demo proper BLE squat technique without Rt knee valgus deviation x5 reps.     Baseline  7/10 reps without valgus  Time  6    Period  Weeks    Status  Achieved      Additional Long Term Goals   Additional Long Term Goals  Yes      PT LONG TERM GOAL #6   Title  Pt will lift atleast 10lb box from the floor, 3/5 trials, to decrease strain on her low back and allow her to bring a case of water into her house.     Time  4    Period  Weeks    Status  New            Plan - 06/07/17 1524    Clinical Impression Statement  Pt arrived with report of new onset  symptoms down the posterior thigh with walking only. Pain was not present during today's session aside from dull ache in the Rt buttock region. This was resolved with soft tissue and mobilization techniques completed beginning of today's session. Pt demonstrated pain free prone hip extension on the Rt up to atleast 5 deg which is a significant improvement from previous sessions. She does continue to have quadriceps dominance during closed chain activity and reqiured cuing with this on step downs completed this session. Ended session without report of pain and pt ambulating without antalgic pattern.     Rehab Potential  Good    PT Frequency  2x / week    PT Duration  4 weeks    PT Treatment/Interventions  ADLs/Self Care Home Management;Electrical Stimulation;Moist Heat;Traction;Balance training;Therapeutic exercise;Therapeutic activities;Functional mobility training;Neuromuscular re-education;Patient/family education;Passive range of motion;Manual techniques;Dry needling;Taping    PT Next Visit Plan  hip adductor slide; deadlift technique; standing hip ext/abd strengthening progression; soft tissue techniques to Rt ITB/quad/glute    PT Home Exercise Plan  bridge, SLR, side hip abd (supine/standing hip abd for when needed), seated nerve flossing     Consulted and Agree with Plan of Care  Patient       Patient will benefit from skilled therapeutic intervention in order to improve the following deficits and impairments:  Decreased balance, Impaired flexibility, Hypomobility, Decreased strength, Decreased endurance, Decreased activity tolerance, Decreased mobility, Increased muscle spasms, Postural dysfunction, Pain, Improper body mechanics  Visit Diagnosis: Muscle weakness (generalized)  Chronic right-sided low back pain, with sciatica presence unspecified  Other muscle spasm     Problem List Patient Active Problem List   Diagnosis Date Noted  . 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 Breast cancer of upper-outer quadrant of left female breast ([place]) 12/07/2014  . Acute recurrent sinusitis 01/17/2013    3:31 PM,06/07/17 Sherol Dade PT, DPT [facility] [address], [place], [place], [address] Phone: [phone]   Fax:  [phone]  Name: Christina [name] MRN: [id number] Date of Birth: 10-31-1950

## 2017-06-12 ENCOUNTER — Ambulatory Visit: Payer: Medicare Other | Admitting: Physical Therapy

## 2017-06-12 ENCOUNTER — Encounter: Payer: Self-pay | Admitting: Physical Therapy

## 2017-06-12 DIAGNOSIS — M545 Low back pain: Secondary | ICD-10-CM | POA: Diagnosis not present

## 2017-06-12 DIAGNOSIS — M62838 Other muscle spasm: Secondary | ICD-10-CM

## 2017-06-12 DIAGNOSIS — M6281 Muscle weakness (generalized): Secondary | ICD-10-CM | POA: Diagnosis not present

## 2017-06-12 DIAGNOSIS — G8929 Other chronic pain: Secondary | ICD-10-CM | POA: Diagnosis not present

## 2017-06-12 NOTE — Patient Instructions (Addendum)
   SEATED MARCHING  While seated on the ball, hips and knees should both be bent to 90 degrees.  Place hands on hips.  Keep feet shoulder width apart.  Now use abdominal (core) muscles to stabilize your position while lifting the right foot off the floor.  Set the right foot back on the floor then lift the left foot in the same way, just as if marching.  Slowly repeat.  x10-15 reps each side.        Dumbbell Dead Lift  Stand with a weight in each hand. Keeping weights against your thighs, bend from the hips and lower weights to just below your knees, bending knees only as much as you need to (you should feel tension in your hamstrings at the bottom of the movement). Again keep weights pulled back into your leg and stand up tall, pushing hips forward.  x10 reps, 2-3 sets    [facility] [address] [place], [place] [address] Phone # [phone] Fax [phone]

## 2017-06-12 NOTE — Therapy (Signed)
[facility] [address], [place], [place], [address] Phone: [phone]   Fax:  [phone]  Physical Therapy Treatment  Patient Details  Name: Christina [name] MRN: [id number] Date of Birth: 1950-09-15 Referring Provider: Hulan Saas, DO   Encounter Date: 06/12/2017  PT End of Session - 06/12/17 1115    Visit Number  28    Date for PT Re-Evaluation  06/15/17    Authorization Type  Medicare A and B (G-codes completed on 06-20-22 visit)    Authorization Time Period  04/05/17 to 05/16/17, NEW 05/17/17 to 06/15/17    PT Start Time  0930    PT Stop Time  1014    PT Time Calculation (min)  44 min    Activity Tolerance  Patient tolerated treatment well;No increased pain    Behavior During Therapy  WFL for tasks assessed/performed       Past Medical History:  Diagnosis Date  . Abrasion of right leg 12/17/2014  . Adrenal insufficiency ([place])   . Breast cancer of upper-outer quadrant of left female breast ([place]) 12/07/2014  . Cataract, immature    bilateral  . Dental bridge present    upper - x 2  . Dental crowns present   . Hypothyroidism   . Prediabetes     Past Surgical History:  Procedure Laterality Date  . ABDOMINOPLASTY    . BALLOON SINUPLASTY    . BREAST LUMPECTOMY WITH RADIOACTIVE SEED AND SENTINEL LYMPH NODE BIOPSY Left 12/22/2014   Procedure: BREAST LUMPECTOMY WITH RADIOACTIVE SEED AND SENTINEL LYMPH NODE BIOPSY;  Surgeon: Stark Klein, MD;  Location: [facility];  Service: General;  Laterality: Left;  . BUNIONECTOMY Right   . HAMMER TOE SURGERY Bilateral   . RHINOPLASTY    . SEPTOPLASTY      There were no vitals filed for this visit.  Subjective Assessment - 06/12/17 0937    Subjective  Pt reports that she has been doing ok. She has some issues with walking still but when she marches or moves to a light jog, she doesn't have any pain. She did her exercises yesterday. Pt has not had any falls  recently or feeling like she is going to fall. Still has issues with walking up hills and stepping up onto curbs on the Rt.     Currently in Pain?  No/denies    Pain Onset  More than a month ago               [facility] Adult PT Treatment/Exercise - 06/12/17 0001      Lumbar Exercises: Machines for Strengthening   Other Lumbar Machine Exercise  power tower: forward walking with 15# x5 reps (8 ft), backward walking x3 reps (34f)      Lumbar Exercises: Seated   Other Seated Lumbar Exercises  on red physioball with alt marching x15 reps each      Knee/Hip Exercises: Standing   Forward Step Up  2 sets;10 reps;Hand Hold: 1;Step Height: 4";Limitations;Both    Forward Step Up Limitations  contralateral hip flexion/knee drive for additional Rt glute activation    Other Standing Knee Exercises  deadlift with 3# dumbbells x10 reps (mirror feedback); Deadlift with red TB eccentric resistance             PT Education - 06/12/17 1012    Education provided  Yes    Education Details  upcoming re-evaluation; discussed exercises to complete at home gym and demonstrated for pt understanding  Person(s) Educated  Patient    Methods  Explanation;Handout;Verbal cues    Comprehension  Verbalized understanding;Returned demonstration       PT Short Term Goals - 06/12/17 0947      PT SHORT TERM GOAL #1   Title  Pt will demo consistency and independence with her HEP to decrease pain and improve BLE strength.     Time  3    Period  Weeks    Status  Achieved      PT SHORT TERM GOAL #2   Title  Pt will report atleast 25% improvement in Rt buttock pain to allow for return to daily brisk walking routine without significant increase in symptoms.     Baseline  60% improved     Time  3    Period  Weeks    Status  Achieved      PT SHORT TERM GOAL #3   Title  Pt will demonstrate Rt tibial inversion to atleast 20 degrees to improve her mechanics with squatting and other closed chain activity.      Time  3    Period  Weeks    Status  On-going        PT Long Term Goals - 06/12/17 0947      PT LONG TERM GOAL #1   Title  Pt will demo improved BLE strength to 5/5 MMT which will increase her safety with daily activity.     Baseline  up to 4/5 and 4+/5 MMT     Time  4    Period  Weeks    Status  Partially Met      PT LONG TERM GOAL #2   Title  Pt will report being able to return to horse riding atleast 3x/week without difficulty to improve her quality of life and participation in activity with her peers.     Baseline  1x/week is ideal in the winter time     Time  4    Period  Weeks    Status  Deferred      PT LONG TERM GOAL #3   Title  Pt will report atleast 75% improvement in her symptoms to allow for her to resume all prior activities without difficulty.     Baseline  90% improved first thing in the morning, but has more pain in the buttock area as the day goes on.    Time  6    Period  Weeks    Status  Partially Met      PT LONG TERM GOAL #4   Title  Pt will demo improved hip stability evident by her ability to maintain single leg balance without LOB or (+) trendelenburg deviation for atleast 10 sec, 2/3 trials.     Baseline  met on the Rt     Time  4    Period  Weeks    Status  Achieved      PT LONG TERM GOAL #5   Title  Pt will demo proper BLE squat technique without Rt knee valgus deviation x5 reps.     Baseline  7/10 reps without valgus     Time  6    Period  Weeks    Status  Achieved      PT LONG TERM GOAL #6   Title  Pt will lift atleast 10lb box from the floor, 3/5 trials, to decrease strain on her low back and allow her to bring a case of water into her  house.     Time  4    Period  Weeks    Status  New            Plan - 06/12/17 1040    Clinical Impression Statement  Pt overall reports 90% improvement in her pain from the start of therapy, however she continues to have gradual increase in fatigue/pain by the end of the day. She has been able to  resume horseback riding without any reports of increased low back/buttock pain. Pt's primary complaint is walking up hills and stepping up onto higher surfaces when leading with the RLE. Previously, pt has had issues with consistent HEP compliance when noting soreness following her sessions and this has made therex progression somewhat difficult. She has recently been more motivated to complete exercise progressions without report of increase in pain and today we discussed several exercises and progressions for her to complete at her home gym. Pt verbalized and demonstrated good understanding of today's HEP additions. Anticipate d/c next visit assuming no new issues/concerns arise.     Rehab Potential  Good    PT Frequency  2x / week    PT Duration  4 weeks    PT Treatment/Interventions  ADLs/Self Care Home Management;Electrical Stimulation;Moist Heat;Traction;Balance training;Therapeutic exercise;Therapeutic activities;Functional mobility training;Neuromuscular re-education;Patient/family education;Passive range of motion;Manual techniques;Dry needling;Taping    PT Next Visit Plan  re-evaluation; possible d/c and provide HEP progressions for RLE strengthening (step ups)     PT Home Exercise Plan  bridge, SLR, side hip abd (supine/standing hip abd for when needed), seated nerve flossing, partial deadlifts, seated marching on physioball    Consulted and Agree with Plan of Care  Patient       Patient will benefit from skilled therapeutic intervention in order to improve the following deficits and impairments:  Decreased balance, Impaired flexibility, Hypomobility, Decreased strength, Decreased endurance, Decreased activity tolerance, Decreased mobility, Increased muscle spasms, Postural dysfunction, Pain, Improper body mechanics  Visit Diagnosis: Muscle weakness (generalized)  Chronic right-sided low back pain, with sciatica presence unspecified  Other muscle spasm     Problem List Patient  Active Problem List   Diagnosis Date Noted  . 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 Breast cancer of upper-outer quadrant of left female breast ([place]) 12/07/2014  . Acute recurrent sinusitis 01/17/2013   11:19 AM,06/12/17 Sherol Dade PT, DPT [facility] [address] [place], [place], [address] Phone: [phone]   Fax:  [phone]  Name: Christina [name] MRN: [id number] Date of Birth: 02/02/1951

## 2017-06-13 ENCOUNTER — Encounter: Payer: Self-pay | Admitting: Family Medicine

## 2017-06-13 ENCOUNTER — Ambulatory Visit (INDEPENDENT_AMBULATORY_CARE_PROVIDER_SITE_OTHER): Payer: Medicare Other | Admitting: Family Medicine

## 2017-06-13 DIAGNOSIS — S3421XD Injury of nerve root of lumbar spine, subsequent encounter: Secondary | ICD-10-CM

## 2017-06-13 DIAGNOSIS — M7061 Trochanteric bursitis, right hip: Secondary | ICD-10-CM | POA: Diagnosis not present

## 2017-06-13 NOTE — Patient Instructions (Signed)
Good to see you  Ice is your friend 20 grams of protein after you workout.  Change to Collagen protein and see if it help  OCnitnue the vitamins Make sure 400mg  of CoQ10 daily  New abbreviated workouts for you to try  Refilled the nortriptyline.  Injected side of hip today  See me again in 2 months!

## 2017-06-13 NOTE — Assessment & Plan Note (Signed)
Stable at the moment.  Patient seems to be doing well with low dose of gabapentin which is somewhat noncompliant with.  Patient has been doing the exercises and has noticed improvement.  Given the abbreviated workout schedule because she felt like it was too much.  Discussed with patient about icing regimen, home exercise, which avoid.  Patient is to increase activity as tolerated.

## 2017-06-13 NOTE — Progress Notes (Signed)
[name] [place], [place] [address] Phone: [phone] Subjective:    CC: Back pain follow-up  [id number]  Christina [name] is a 67 y.o. female coming in with complaint of  Low back pain.  Patient has seen previously.  Workup included an L3 nerve root impingement.  Have attempted epidurals as well as selective nerve root injections.  Mild to moderate benefit.  Patient has started formal physical therapy.  Patient has been doing it for 2 months.  Feels like it has made benefit.  Still has some radicular symptoms.  Patient though states is at this moment more pain on the right side.  Seems to be on the lateral aspect of the hip.     MRI July fifth 2018 showed patient with a right-sided L3 nerve root impingement with moderate foraminal stenosis at L4-L5 as well.  This was independently visualized by me.  Past Medical History:  Diagnosis Date  . Abrasion of right leg 12/17/2014  . Adrenal insufficiency ([place])   . Breast cancer of upper-outer quadrant of left female breast ([place]) 12/07/2014  . Cataract, immature    bilateral  . Dental bridge present    upper - x 2  . Dental crowns present   . Hypothyroidism   . Prediabetes    Past Surgical History:  Procedure Laterality Date  . ABDOMINOPLASTY    . BALLOON SINUPLASTY    . BREAST LUMPECTOMY WITH RADIOACTIVE SEED AND SENTINEL LYMPH NODE BIOPSY Left 12/22/2014   Procedure: BREAST LUMPECTOMY WITH RADIOACTIVE SEED AND SENTINEL LYMPH NODE BIOPSY;  Surgeon: Stark Klein, MD;  Location: [facility];  Service: General;  Laterality: Left;  . BUNIONECTOMY Right   . HAMMER TOE SURGERY Bilateral   . RHINOPLASTY    . SEPTOPLASTY     Social History   Socioeconomic History  . Marital status: Widowed    Spouse name: None  . Number of children: None  . Years of education: None  . Highest education level: None  Social Needs  . Financial resource strain: None  . Food insecurity -  worry: None  . Food insecurity - inability: None  . Transportation needs - medical: None  . Transportation needs - non-medical: None  Occupational History    Employer: [employer]  Tobacco Use  . Smoking status: Former Smoker    Packs/day: 0.00    Years: 0.00    Pack years: 0.00    Last attempt to quit: 05/01/1980    Years since quitting: 37.1  . Smokeless tobacco: Never Used  Substance and Sexual Activity  . Alcohol use: Yes    Alcohol/week: 0.0 oz    Comment: occasionally  . Drug use: No  . Sexual activity: None  Other Topics Concern  . None  Social History Narrative  . None   Allergies  Allergen Reactions  . Celery Oil Other (See Comments)    FEVER BLISTERS  . Doxycycline Nausea And Vomiting  . Rye Grass Flower Pollen Extract [Gramineae Pollens] Swelling    RYE:  SWELLING THROAT - DENIES SOB  . Tea Swelling    THROAT - DENIES SOB  . Adhesive [Tape] Other (See Comments)    TEARS SKIN  . Sulfa Antibiotics Swelling    NOSE   Family History  Problem Relation Age of Onset  . Diabetes Mother   . Diabetes Father   . Cancer Maternal Uncle        pancreatic cancer   .  Diabetes Paternal Grandmother   . Diabetes Paternal Grandfather      Past medical history, social, surgical and family history all reviewed in electronic medical record.  No pertanent information unless stated regarding to the chief complaint.   Review of Systems:Review of systems updated and as accurate as of 06/13/17  No headache, visual changes, nausea, vomiting, diarrhea, constipation, dizziness, abdominal pain, skin rash, fevers, chills, night sweats, weight loss, swollen lymph nodes, body aches, joint swelling, muscle aches, chest pain, shortness of breath, mood changes.  Positive muscle aches  Objective  Blood pressure 124/68, pulse 81, height 5\' 3"  (1.6 m), weight 108 lb (49 kg), SpO2 97 %. Systems examined below as of 06/13/17   General: No apparent distress alert and oriented x3 mood and  affect normal, dressed appropriately.  Underweight HEENT: Pupils equal, extraocular movements intact  Respiratory: Patient's speak in full sentences and does not appear short of breath  Cardiovascular: No lower extremity edema, non tender, no erythema  Skin: Warm dry intact with no signs of infection or rash on extremities or on axial skeleton.  Abdomen: Soft nontender  Neuro: Cranial nerves II through XII are intact, neurovascularly intact in all extremities with 2+ DTRs and 2+ pulses.  Lymph: No lymphadenopathy of posterior or anterior cervical chain or axillae bilaterally.  Gait normal with good balance and coordination.  MSK:  Non tender with full range of motion and good stability and symmetric strength and tone of shoulders, elbows, wrist, knee and ankles bilaterally.  Patient's right hip shows some mild decrease in range of motion by 5 degrees in all planes.  Negative straight leg test today but patient does have a positive Corky Sox.  Severe tenderness to palpation over the lateral aspect of the hip.  Neurovascularly intact distally   Procedure: Real-time Ultrasound Guided Injection of right greater trochanteric bursitis secondary to patient's body habitus Device: GE Logiq Q7 Ultrasound guided injection is preferred based studies that show increased duration, increased effect, greater accuracy, decreased procedural pain, increased response rate, and decreased cost with ultrasound guided versus blind injection.  Verbal informed consent obtained.  Time-out conducted.  Noted no overlying erythema, induration, or other signs of local infection.  Skin prepped in a sterile fashion.  Local anesthesia: Topical Ethyl chloride.  With sterile technique and under real time ultrasound guidance:  Greater trochanteric area was visualized and patient's bursa was noted. A 22-gauge 3 inch needle was inserted and 4 cc of 0.5% Marcaine and 1 cc of Kenalog 40 mg/dL was injected. Pictures taken Completed without  difficulty  Pain immediately resolved suggesting accurate placement of the medication.  Advised to call if fevers/chills, erythema, induration, drainage, or persistent bleeding.  Images permanently stored and available for review in the ultrasound unit.  Impression: Technically successful ultrasound guided injection.   Impression and Recommendations:     This case required medical decision making of moderate complexity.      Note: This dictation was prepared with Dragon dictation along with smaller phrase technology. Any transcriptional errors that result from this process are unintentional.

## 2017-06-13 NOTE — Assessment & Plan Note (Signed)
Injection given today.  Near complete resolution of the pain of the hip.  Discussed icing regimen.  Discussed topical anti-inflammatories.  Patient will come back and see me again in 6-8 weeks.

## 2017-06-14 ENCOUNTER — Ambulatory Visit: Payer: Medicare Other | Admitting: Physical Therapy

## 2017-06-14 DIAGNOSIS — M62838 Other muscle spasm: Secondary | ICD-10-CM | POA: Diagnosis not present

## 2017-06-14 DIAGNOSIS — M6281 Muscle weakness (generalized): Secondary | ICD-10-CM | POA: Diagnosis not present

## 2017-06-14 DIAGNOSIS — M545 Low back pain: Secondary | ICD-10-CM | POA: Diagnosis not present

## 2017-06-14 DIAGNOSIS — G8929 Other chronic pain: Secondary | ICD-10-CM | POA: Diagnosis not present

## 2017-06-14 NOTE — Therapy (Signed)
[facility] [address] [place], [place], [address] Phone: [phone]   Fax:  [phone]  Physical Therapy Treatment/Discharge  Patient Details  Name: Christina [name] MRN: [id number] Date of Birth: 1950/05/11 Referring Provider: Hulan Saas, DO   Encounter Date: 06/14/2017  PT End of Session - 06/14/17 1306    Visit Number  29    Date for PT Re-Evaluation  06/15/17    Authorization Type  Medicare A and B (G-codes completed on 2022/07/05 visit)    Authorization Time Period  04/05/17 to 05/16/17, NEW 05/17/17 to 06/15/17    PT Start Time  1230    PT Stop Time  1303 Pt requesting to end early due to horseriding later     PT Time Calculation (min)  33 min    Activity Tolerance  Patient tolerated treatment well;No increased pain    Behavior During Therapy  WFL for tasks assessed/performed       Past Medical History:  Diagnosis Date  . Abrasion of right leg 12/17/2014  . Adrenal insufficiency ([place])   . Breast cancer of upper-outer quadrant of left female breast ([place]) 12/07/2014  . Cataract, immature    bilateral  . Dental bridge present    upper - x 2  . Dental crowns present   . Hypothyroidism   . Prediabetes     Past Surgical History:  Procedure Laterality Date  . ABDOMINOPLASTY    . BALLOON SINUPLASTY    . BREAST LUMPECTOMY WITH RADIOACTIVE SEED AND SENTINEL LYMPH NODE BIOPSY Left 12/22/2014   Procedure: BREAST LUMPECTOMY WITH RADIOACTIVE SEED AND SENTINEL LYMPH NODE BIOPSY;  Surgeon: Stark Klein, MD;  Location: [place];  Service: General;  Laterality: Left;  . BUNIONECTOMY Right   . HAMMER TOE SURGERY Bilateral   . RHINOPLASTY    . SEPTOPLASTY      There were no vitals filed for this visit.  Subjective Assessment - 06/14/17 1234    Subjective  Pt reports that things are going well. She saw the MD yesterday and says that she got an injection which seemed to help alot. However, she does note  feeling "uneven" so she has not tried to walk any. Overall she feels 90% improved since starting therapy.      Currently in Pain?  No/denies    Pain Onset  More than a month ago         [facility] PT Assessment - 06/14/17 0001      Assessment   Medical Diagnosis  Lumbar radiculopathy     Referring Provider  Hulan Saas, DO    Onset Date/Surgical Date  -- ~3-6 months ago     Prior Therapy  none       [place]      Prior Function   Level of Independence  Independent      Observation/Other Assessments   Focus on Therapeutic Outcomes (FOTO)   39%      Sensation   Light Touch  Appears Intact    Additional Comments  Pt denies dumbness on the anterior thigh       Functional Tests   Functional tests  Squat;Single leg stance;Sit to Stand      Squat   Comments  7/10 reps without knee valgus on the Rt       Single Leg Stance   Comments  Lt 13 sec, Rt 20 sec (no trendelenburg noted)  Sit to Stand   Comments  able to stand without use of UEs       AROM   Lumbar Flexion  WNL, pain free    Lumbar Extension  WNL, pain free      Strength   Overall Strength Comments  --    Right Hip Flexion  5/5    Right Hip Extension  5/5    Right Hip ABduction  4+/5    Left Hip Flexion  5/5    Left Hip Extension  5/5 pain free    Left Hip ABduction  5/5    Right Knee Extension  5/5    Left Knee Extension  5/5    Right Ankle Dorsiflexion  5/5    Left Ankle Dorsiflexion  5/5      Flexibility   Soft Tissue Assessment /Muscle Length  yes    Hamstrings  WNL    Piriformis  WNL      Palpation   Spinal mobility  hypomobile throughout Lumbar spine, non tender     Palpation comment  non-tender with palpation       Ambulation/Gait   Gait Comments  --              PT Education - 06/14/17 1304    Education provided  Yes    Education Details  lifting mechanics; HEP moving forward; importance of conitnuing to progress hip strength; d/c  from PT due to most goals met     Person(s) Educated  Patient    Methods  Explanation;Verbal cues    Comprehension  Verbalized understanding;Returned demonstration       PT Short Term Goals - 06/14/17 1243      PT SHORT TERM GOAL #1   Title  Pt will demo consistency and independence with her HEP to decrease pain and improve BLE strength.     Time  3    Period  Weeks    Status  Achieved      PT SHORT TERM GOAL #2   Title  Pt will report atleast 25% improvement in Rt buttock pain to allow for return to daily brisk walking routine without significant increase in symptoms.     Baseline  90% improved     Time  3    Period  Weeks    Status  Achieved      PT SHORT TERM GOAL #3   Title  Pt will demonstrate Rt tibial inversion to atleast 20 degrees to improve her mechanics with squatting and other closed chain activity.     Time  3    Period  Weeks    Status  Deferred        PT Long Term Goals - 06/14/17 1244      PT LONG TERM GOAL #1   Title  Pt will demo improved BLE strength to 5/5 MMT which will increase her safety with daily activity.     Baseline  4/5 MMT Rt hip abductors     Time  4    Period  Weeks    Status  Partially Met      PT LONG TERM GOAL #2   Title  Pt will report being able to return to horse riding atleast 3x/week without difficulty to improve her quality of life and participation in activity with her peers.     Baseline  1x/week is ideal in the winter time     Time  4    Period  Weeks  Status  Deferred      PT LONG TERM GOAL #3   Title  Pt will report atleast 75% improvement in her symptoms to allow for her to resume all prior activities without difficulty.     Baseline  90% improved first thing in the morning    Time  6    Period  Weeks    Status  Achieved      PT LONG TERM GOAL #4   Title  Pt will demo improved hip stability evident by her ability to maintain single leg balance without LOB or (+) trendelenburg deviation for atleast 10 sec, 2/3  trials.     Baseline  4-7 sec on Rt, 7-10 sec on Lt    Time  4    Period  Weeks    Status  Partially Met      PT LONG TERM GOAL #5   Title  Pt will demo proper BLE squat technique without Rt knee valgus deviation x5 reps.     Baseline  7/10 reps without valgus     Time  6    Period  Weeks    Status  Achieved      PT LONG TERM GOAL #6   Title  Pt will lift atleast 10lb box from the floor, 3/5 trials, to decrease strain on her low back and allow her to bring a case of water into her house.     Time  4    Period  Weeks    Status  Achieved            Plan - 06/14/17 1307    Clinical Impression Statement  Pt was discharged this visit having met all but 2 of her long term goals. She demonstrates improved BLE strength to 5/5 except for hip abductor strength on the Rt, which is improved but remains limited. Pt's active lumbar ROM is within functional limits and pain free. She does have improved single leg stability, however she is still not able to maintain this for more than 5 sec on the Rt due to weakness and poor motor control. She is still only moderately compliant with her HEP, but was agreeable to some adjustments made this session moving forward. Pt no longer has numbness/tingling along the RLE. Her Rt hip pain has greatly improved prior to and even more so following injections received from her referring physician recently. Pt was agreeable with d/c at this time and plans to continue completing her updated HEP in the future.     Rehab Potential  Good    PT Frequency  2x / week    PT Duration  4 weeks    PT Treatment/Interventions  ADLs/Self Care Home Management;Electrical Stimulation;Moist Heat;Traction;Balance training;Therapeutic exercise;Therapeutic activities;Functional mobility training;Neuromuscular re-education;Patient/family education;Passive range of motion;Manual techniques;Dry needling;Taping    PT Next Visit Plan  d/c home     PT Home Exercise Plan  bridge, SLR, side hip  abd (supine/standing hip abd for when needed), seated nerve flossing, partial deadlifts, seated marching on physioball    Consulted and Agree with Plan of Care  Patient       Patient will benefit from skilled therapeutic intervention in order to improve the following deficits and impairments:  Decreased balance, Impaired flexibility, Hypomobility, Decreased strength, Decreased endurance, Decreased activity tolerance, Decreased mobility, Increased muscle spasms, Postural dysfunction, Pain, Improper body mechanics  Visit Diagnosis: Muscle weakness (generalized)  Chronic right-sided low back pain, with sciatica presence unspecified  Other muscle spasm  Problem List Patient Active Problem List   Diagnosis Date Noted  . Greater trochanteric bursitis of right hip 06/13/2017  . 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 Breast cancer of upper-outer quadrant of left female breast ([place]) 12/07/2014  . Acute recurrent sinusitis 01/17/2013    PHYSICAL THERAPY DISCHARGE SUMMARY  Visits from Start of Care: 29  Current functional level related to goals / functional outcomes: See above for more details    Remaining deficits: See above for more details    Education / Equipment: See above for more details  Plan: Patient agrees to discharge.  Patient goals were met. Patient is being discharged due to meeting the stated rehab goals.  ?????       1:15 PM,06/14/17 Sherol Dade PT, [place] [facility] [place]  [facility] [address], [place], [place], [address] Phone: [phone]   Fax:  [phone]  Name: BOBETTA KORF MRN: [id number] Date of Birth: 04-16-1951

## 2017-06-14 NOTE — Patient Instructions (Signed)
   Forward Step Ups with Leg Drivers  Step up onto a 4-6" step with one foot.  With the opposite leg drive the knee up towards your chest.  Return both feet to the floor and repeat.     10 reps, working up to 2 sets of 10 reps      [facility] [address] [place], [place] [address] Phone # [phone] Fax [phone]

## 2017-06-25 DIAGNOSIS — B0089 Other herpesviral infection: Secondary | ICD-10-CM | POA: Diagnosis not present

## 2017-06-25 DIAGNOSIS — D692 Other nonthrombocytopenic purpura: Secondary | ICD-10-CM | POA: Diagnosis not present

## 2017-06-25 DIAGNOSIS — Z85828 Personal history of other malignant neoplasm of skin: Secondary | ICD-10-CM | POA: Diagnosis not present

## 2017-06-25 DIAGNOSIS — L853 Xerosis cutis: Secondary | ICD-10-CM | POA: Diagnosis not present

## 2017-06-25 DIAGNOSIS — L821 Other seborrheic keratosis: Secondary | ICD-10-CM | POA: Diagnosis not present

## 2017-06-25 DIAGNOSIS — L570 Actinic keratosis: Secondary | ICD-10-CM | POA: Diagnosis not present

## 2017-06-25 DIAGNOSIS — C44622 Squamous cell carcinoma of skin of right upper limb, including shoulder: Secondary | ICD-10-CM | POA: Diagnosis not present

## 2017-07-09 ENCOUNTER — Other Ambulatory Visit: Payer: Self-pay | Admitting: General Surgery

## 2017-07-09 DIAGNOSIS — Z9889 Other specified postprocedural states: Secondary | ICD-10-CM

## 2017-07-16 DIAGNOSIS — H04123 Dry eye syndrome of bilateral lacrimal glands: Secondary | ICD-10-CM | POA: Diagnosis not present

## 2017-07-16 DIAGNOSIS — H16103 Unspecified superficial keratitis, bilateral: Secondary | ICD-10-CM | POA: Diagnosis not present

## 2017-07-16 DIAGNOSIS — H5310 Unspecified subjective visual disturbances: Secondary | ICD-10-CM | POA: Diagnosis not present

## 2017-07-16 DIAGNOSIS — H5713 Ocular pain, bilateral: Secondary | ICD-10-CM | POA: Diagnosis not present

## 2017-07-24 DIAGNOSIS — C50912 Malignant neoplasm of unspecified site of left female breast: Secondary | ICD-10-CM | POA: Diagnosis not present

## 2017-07-24 DIAGNOSIS — N951 Menopausal and female climacteric states: Secondary | ICD-10-CM | POA: Diagnosis not present

## 2017-07-24 DIAGNOSIS — E279 Disorder of adrenal gland, unspecified: Secondary | ICD-10-CM | POA: Diagnosis not present

## 2017-08-06 NOTE — Progress Notes (Signed)
[name] [place], [place] [address] Phone: [phone] Subjective:       CC: Back pain follow-up  [id number]  Christina [name] is a 67 y.o. female coming in with complaint of back pain. She is having good and bad days. Working out and walking with her dog seems to cause an increase in her back pain. Her right leg has a "heavy" sensation she states.  Patient does have a known L3 nerve root impingement on the right side.  Did respond fairly well to the injections we have had previously done.  Patient states that now it feels to be worsening again.  Has done physical therapy and dry needling with some mild improvement.  Continues to be very active and rides horses on a regular basis.  Feels a greater trochanteric injection given was helpful.    Past Medical History:  Diagnosis Date  . Abrasion of right leg 12/17/2014  . Adrenal insufficiency ([place])   . Breast cancer of upper-outer quadrant of left female breast ([place]) 12/07/2014  . Cataract, immature    bilateral  . Dental bridge present    upper - x 2  . Dental crowns present   . Hypothyroidism   . Prediabetes    Past Surgical History:  Procedure Laterality Date  . ABDOMINOPLASTY    . BALLOON SINUPLASTY    . BREAST LUMPECTOMY WITH RADIOACTIVE SEED AND SENTINEL LYMPH NODE BIOPSY Left 12/22/2014   Procedure: BREAST LUMPECTOMY WITH RADIOACTIVE SEED AND SENTINEL LYMPH NODE BIOPSY;  Surgeon: Stark Klein, MD;  Location: [place];  Service: General;  Laterality: Left;  . BUNIONECTOMY Right   . HAMMER TOE SURGERY Bilateral   . RHINOPLASTY    . SEPTOPLASTY     Social History   Socioeconomic History  . Marital status: Widowed    Spouse name: Not on file  . Number of children: Not on file  . Years of education: Not on file  . Highest education level: Not on file  Occupational History    Employer: [employer]  . Financial resource strain: Not on file    . Food insecurity:    Worry: Not on file    Inability: Not on file  . Transportation needs:    Medical: Not on file    Non-medical: Not on file  Tobacco Use  . Smoking status: Former Smoker    Packs/day: 0.00    Years: 0.00    Pack years: 0.00    Last attempt to quit: 05/01/1980    Years since quitting: 37.2  . Smokeless tobacco: Never Used  Substance and Sexual Activity  . Alcohol use: Yes    Alcohol/week: 0.0 oz    Comment: occasionally  . Drug use: No  . Sexual activity: Not on file  Lifestyle  . Physical activity:    Days per week: Not on file    Minutes per session: Not on file  . Stress: Not on file  Relationships  . Social connections:    Talks on phone: Not on file    Gets together: Not on file    Attends religious service: Not on file    Active member of club or organization: Not on file    Attends meetings of clubs or organizations: Not on file    Relationship status: Not on file  Other Topics Concern  . Not on file  Social History Narrative  . Not on file  Allergies  Allergen Reactions  . Celery Oil Other (See Comments)    FEVER BLISTERS  . Doxycycline Nausea And Vomiting  . Rye Grass Flower Pollen Extract [Gramineae Pollens] Swelling    RYE:  SWELLING THROAT - DENIES SOB  . Tea Swelling    THROAT - DENIES SOB  . Adhesive [Tape] Other (See Comments)    TEARS SKIN  . Sulfa Antibiotics Swelling    NOSE   Family History  Problem Relation Age of Onset  . Diabetes Mother   . Diabetes Father   . Cancer Maternal Uncle        pancreatic cancer   . Diabetes Paternal Grandmother   . Diabetes Paternal Grandfather      Past medical history, social, surgical and family history all reviewed in electronic medical record.  No pertanent information unless stated regarding to the chief complaint.   Review of Systems:Review of systems updated and as accurate as of 08/08/17  No headache, visual changes, nausea, vomiting, diarrhea, constipation, dizziness,  abdominal pain, skin rash, fevers, chills, night sweats, weight loss, swollen lymph nodes,  joint swelling, , chest pain, shortness of breath, mood changes.  Positive muscle aches, body aches  Objective  Blood pressure 132/72, height 5\' 3"  (1.6 m), weight 103 lb (46.7 kg). Systems examined below as of 08/08/17   General: No apparent distress alert and oriented x3 mood and affect normal, dressed appropriately.  Still underweight HEENT: Pupils equal, extraocular movements intact  Respiratory: Patient's speak in full sentences and does not appear short of breath  Cardiovascular: No lower extremity edema, non tender, no erythema  Skin: Warm dry intact with no signs of infection or rash on extremities or on axial skeleton.  Abdomen: Soft nontender  Neuro: Cranial nerves II through XII are intact, neurovascularly intact in all extremities with 2+ DTRs and 2+ pulses.  Lymph: No lymphadenopathy of posterior or anterior cervical chain or axillae bilaterally.  Gait normal with good balance and coordination.  MSK:  Non tender with full range of motion and good stability and symmetric strength and tone of shoulders, elbows, wrist, hip, knee and ankles bilaterally.  Back exam still shows severe tenderness to palpation in the paraspinal musculature of the lumbar spine and the right sacroiliac joint.  Patient does have increasing hamstring tension and 20 degrees of forward flexion.  Strength is 4+ out of 5 compared to the contralateral side.   Impression and Recommendations:     This case required medical decision making of moderate complexity.      Note: This dictation was prepared with Dragon dictation along with smaller phrase technology. Any transcriptional errors that result from this process are unintentional.

## 2017-08-07 ENCOUNTER — Encounter: Payer: Self-pay | Admitting: Family Medicine

## 2017-08-07 ENCOUNTER — Ambulatory Visit (INDEPENDENT_AMBULATORY_CARE_PROVIDER_SITE_OTHER): Payer: Medicare Other | Admitting: Family Medicine

## 2017-08-07 VITALS — BP 132/72 | Ht 63.0 in | Wt 103.0 lb

## 2017-08-07 DIAGNOSIS — S3421XD Injury of nerve root of lumbar spine, subsequent encounter: Secondary | ICD-10-CM | POA: Diagnosis not present

## 2017-08-07 DIAGNOSIS — M5416 Radiculopathy, lumbar region: Secondary | ICD-10-CM | POA: Diagnosis not present

## 2017-08-07 NOTE — Patient Instructions (Signed)
Good to see you  I am sorry we can't get it under control  Lets try another nerve root injection.  I will touch base with Dr. Jola Baptist and see if radiofrequency ablation could be helpful.  After you have the injection see me again in 4 weeks Continue all meds for now.  We can consider looking at blood flow but not likely  I do think more calories the better at this time.  See me again 4 weeks after injection

## 2017-08-08 NOTE — Assessment & Plan Note (Signed)
Patient has had some improvement with the nerve root injections that seem to be consistent.  Patient could be a candidate for a radiofrequency ablation and will check with interventional radiology.  Depending on this we will discuss if this will change from further management.  Hopefully will be able to order this.  In the interim we will may order another epidural and encourage her to continue physical therapy.  Patient does not want to increase any of her medications at this time.  Follow-up again in 2 months

## 2017-08-21 ENCOUNTER — Ambulatory Visit
Admission: RE | Admit: 2017-08-21 | Discharge: 2017-08-21 | Disposition: A | Discharge status: Home / Self Care | Payer: Medicare Other | Source: Ambulatory Visit | Attending: Family Medicine | Admitting: Family Medicine

## 2017-08-21 DIAGNOSIS — M5416 Radiculopathy, lumbar region: Secondary | ICD-10-CM

## 2017-08-21 DIAGNOSIS — M4727 Other spondylosis with radiculopathy, lumbosacral region: Secondary | ICD-10-CM | POA: Diagnosis not present

## 2017-08-21 MED ORDER — IOPAMIDOL (ISOVUE-M 200) INJECTION 41%
1.0000 mL | Freq: Once | INTRAMUSCULAR | Status: AC
  Administered 2017-08-21: 1 mL via EPIDURAL

## 2017-08-21 MED ORDER — METHYLPREDNISOLONE ACETATE 40 MG/ML INJ SUSP (RADIOLOG
120.0000 mg | Freq: Once | INTRAMUSCULAR | Status: AC
  Administered 2017-08-21: 120 mg via EPIDURAL

## 2017-09-14 ENCOUNTER — Encounter: Payer: Self-pay | Admitting: Family Medicine

## 2017-09-14 ENCOUNTER — Ambulatory Visit (INDEPENDENT_AMBULATORY_CARE_PROVIDER_SITE_OTHER): Payer: Medicare Other | Admitting: Family Medicine

## 2017-09-14 DIAGNOSIS — S3421XD Injury of nerve root of lumbar spine, subsequent encounter: Secondary | ICD-10-CM | POA: Diagnosis not present

## 2017-09-14 NOTE — Patient Instructions (Signed)
Good to see you  I am so happy you are better Enjoy the massage You know where I am if you need me If worsening call me and we will get [place] to do the injections again  If you need the injections then I want to see you again 2 weeks after that  Keep it up!!!

## 2017-09-14 NOTE — Progress Notes (Signed)
[name] [place], [place] [address] Phone: [phone] Subjective:    I'm seeing this patient by the request  of:    CC: Back pain follow-up.  [id number]  [name] is a 67 y.o. female coming in with complaint of back pain. She is following up from her epidural. She is doing much better.  Patient was found to have an L3 nerve root impingement.  Did have a nerve root injection patient states that she is feeling much better.  Patient does think though that unfortunately starting her anxiety medications on a more regular basis also seem to help.  Patient does not know which one helped more.      Past Medical History:  Diagnosis Date  . Abrasion of right leg 12/17/2014  . Adrenal insufficiency ([place])   . Breast cancer of upper-outer quadrant of left female breast ([place]) 12/07/2014  . Cataract, immature    bilateral  . Dental bridge present    upper - x 2  . Dental crowns present   . Hypothyroidism   . Prediabetes    Past Surgical History:  Procedure Laterality Date  . ABDOMINOPLASTY    . BALLOON SINUPLASTY    . BREAST LUMPECTOMY WITH RADIOACTIVE SEED AND SENTINEL LYMPH NODE BIOPSY Left 12/22/2014   Procedure: BREAST LUMPECTOMY WITH RADIOACTIVE SEED AND SENTINEL LYMPH NODE BIOPSY;  Surgeon: Stark Klein, MD;  Location: [place];  Service: General;  Laterality: Left;  . BUNIONECTOMY Right   . HAMMER TOE SURGERY Bilateral   . RHINOPLASTY    . SEPTOPLASTY     Social History   Socioeconomic History  . Marital status: Widowed    Spouse name: Not on file  . Number of children: Not on file  . Years of education: Not on file  . Highest education level: Not on file  Occupational History    Employer: [employer]  . Financial resource strain: Not on file  . Food insecurity:    Worry: Not on file    Inability: Not on file  . Transportation needs:    Medical: Not on file    Non-medical: Not on  file  Tobacco Use  . Smoking status: Former Smoker    Packs/day: 0.00    Years: 0.00    Pack years: 0.00    Last attempt to quit: 05/01/1980    Years since quitting: 37.3  . Smokeless tobacco: Never Used  Substance and Sexual Activity  . Alcohol use: Yes    Alcohol/week: 0.0 oz    Comment: occasionally  . Drug use: No  . Sexual activity: Not on file  Lifestyle  . Physical activity:    Days per week: Not on file    Minutes per session: Not on file  . Stress: Not on file  Relationships  . Social connections:    Talks on phone: Not on file    Gets together: Not on file    Attends religious service: Not on file    Active member of club or organization: Not on file    Attends meetings of clubs or organizations: Not on file    Relationship status: Not on file  Other Topics Concern  . Not on file  Social History Narrative  . Not on file   Allergies  Allergen Reactions  . Celery Oil Other (See Comments)    FEVER BLISTERS  . Doxycycline Nausea And Vomiting  . Rye Grass Flower  Pollen Extract [Gramineae Pollens] Swelling    RYE:  SWELLING THROAT - DENIES SOB  . Tea Swelling    THROAT - DENIES SOB  . Adhesive [Tape] Other (See Comments)    TEARS SKIN  . Sulfa Antibiotics Swelling    NOSE   Family History  Problem Relation Age of Onset  . Diabetes Mother   . Diabetes Father   . Cancer Maternal Uncle        pancreatic cancer   . Diabetes Paternal Grandmother   . Diabetes Paternal Grandfather      Past medical history, social, surgical and family history all reviewed in electronic medical record.  No pertanent information unless stated regarding to the chief complaint.   Review of Systems:Review of systems updated and as accurate as of 09/14/17  No headache, visual changes, nausea, vomiting, diarrhea, constipation, dizziness, abdominal pain, skin rash, fevers, chills, night sweats, weight loss, swollen lymph nodes, body aches, joint swelling,  chest pain, shortness of  breath, mood changes.  Mild positive muscle aches  Objective  Blood pressure 112/64, height 5\' 3"  (1.6 m), weight 106 lb (48.1 kg). Systems examined below as of 09/14/17   General: No apparent distress alert and oriented x3 mood and affect normal, dressed appropriately.  HEENT: Pupils equal, extraocular movements intact  Respiratory: Patient's speak in full sentences and does not appear short of breath  Cardiovascular: No lower extremity edema, non tender, no erythema  Skin: Warm dry intact with no signs of infection or rash on extremities or on axial skeleton.  Abdomen: Soft nontender  Neuro: Cranial nerves II through XII are intact, neurovascularly intact in all extremities with 2+ DTRs and 2+ pulses.  Lymph: No lymphadenopathy of posterior or anterior cervical chain or axillae bilaterally.  Gait normal with good balance and coordination.  MSK:  Non tender with full range of motion and good stability and symmetric strength and tone of shoulders, elbows, wrist, hip, knee and ankles bilaterally.  Back Exam:  Inspection: Mild degenerative scoliosis Motion: Flexion 45 deg, Extension 25 deg, Side Bending to 35 deg bilaterally,  Rotation to 45 deg bilaterally  SLR laying: Negative  XSLR laying: Negative  Palpable tenderness: Tender to palpation the paraspinal musculature.Marland Kitchen FABER: Mild positive on the left. Sensory change: Gross sensation intact to all lumbar and sacral dermatomes.  Reflexes: 2+ at both patellar tendons, 2+ at achilles tendons, Babinski's downgoing.  Strength at foot  Plantar-flexion: 5/5 Dorsi-flexion: 5/5 Eversion: 5/5 Inversion: 5/5  Leg strength  Quad: 5/5 Hamstring: 5/5 Hip flexor: 5/5 Hip abductors: 5/5  Gait unremarkable.     Impression and Recommendations:     This case required medical decision making of moderate complexity.      Note: This dictation was prepared with Dragon dictation along with smaller phrase technology. Any transcriptional errors that  result from this process are unintentional.

## 2017-09-14 NOTE — Assessment & Plan Note (Signed)
Patient did remarkably well with Dr. Clance Boll with a nerve root injection.  If any worsening symptoms we will consider repeating again.  Patient wants to avoid any surgical intervention at this point.  Discussed topical anti-inflammatories.  Patient is going to talk to her primary care provider about her anxiety medications.  Patient will follow-up with me again as needed

## 2017-09-19 DIAGNOSIS — D692 Other nonthrombocytopenic purpura: Secondary | ICD-10-CM | POA: Diagnosis not present

## 2017-09-19 DIAGNOSIS — L821 Other seborrheic keratosis: Secondary | ICD-10-CM | POA: Diagnosis not present

## 2017-09-19 DIAGNOSIS — Z85828 Personal history of other malignant neoplasm of skin: Secondary | ICD-10-CM | POA: Diagnosis not present

## 2017-09-25 ENCOUNTER — Encounter (HOSPITAL_COMMUNITY): Payer: Self-pay

## 2017-09-25 ENCOUNTER — Emergency Department (HOSPITAL_COMMUNITY)
Admission: EM | Admit: 2017-09-25 | Discharge: 2017-09-25 | Disposition: A | Discharge status: Home / Self Care | Payer: Medicare Other | Attending: Emergency Medicine | Admitting: Emergency Medicine

## 2017-09-25 ENCOUNTER — Other Ambulatory Visit: Payer: Self-pay

## 2017-09-25 DIAGNOSIS — Z5321 Procedure and treatment not carried out due to patient leaving prior to being seen by health care provider: Secondary | ICD-10-CM | POA: Diagnosis not present

## 2017-09-25 DIAGNOSIS — R55 Syncope and collapse: Secondary | ICD-10-CM | POA: Diagnosis not present

## 2017-09-25 LAB — BASIC METABOLIC PANEL
Anion gap: 12 (ref 5–15)
BUN: 13 mg/dL (ref 6–20)
CO2: 24 mmol/L (ref 22–32)
Calcium: 9.1 mg/dL (ref 8.9–10.3)
Chloride: 100 mmol/L — ABNORMAL LOW (ref 101–111)
Creatinine, Ser: 0.73 mg/dL (ref 0.44–1.00)
GFR calc Af Amer: >60 mL/min (ref >60)
GFR calc non Af Amer: >60 mL/min (ref >60)
Glucose, Bld: 135 mg/dL — ABNORMAL HIGH (ref 65–99)
Potassium: 4.2 mmol/L (ref 3.5–5.1)
Sodium: 136 mmol/L (ref 135–145)

## 2017-09-25 LAB — CBC
HCT: 35.9 % — ABNORMAL LOW (ref 36.0–46.0)
Hemoglobin: 11.9 g/dL — ABNORMAL LOW (ref 12.0–15.0)
MCH: 31.2 pg (ref 26.0–34.0)
MCHC: 33.1 g/dL (ref 30.0–36.0)
MCV: 94.2 fL (ref 78.0–100.0)
Platelets: 308 10*3/uL (ref 150–400)
RBC: 3.81 MIL/uL — ABNORMAL LOW (ref 3.87–5.11)
RDW: 12.8 % (ref 11.5–15.5)
WBC: 14.8 10*3/uL — ABNORMAL HIGH (ref 4.0–10.5)
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
## 2017-09-26 ENCOUNTER — Emergency Department (HOSPITAL_COMMUNITY)
Admission: EM | Admit: 2017-09-26 | Discharge: 2017-09-26 | Discharge status: Against Medical Advice | Payer: Medicare Other | Attending: Emergency Medicine | Admitting: Emergency Medicine

## 2017-09-26 ENCOUNTER — Other Ambulatory Visit: Payer: Self-pay

## 2017-09-26 ENCOUNTER — Encounter (HOSPITAL_COMMUNITY): Payer: Self-pay

## 2017-09-26 DIAGNOSIS — Z5321 Procedure and treatment not carried out due to patient leaving prior to being seen by health care provider: Secondary | ICD-10-CM | POA: Insufficient documentation

## 2017-09-26 DIAGNOSIS — Z041 Encounter for examination and observation following transport accident: Secondary | ICD-10-CM | POA: Insufficient documentation
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
## 2017-10-03 ENCOUNTER — Encounter (HOSPITAL_BASED_OUTPATIENT_CLINIC_OR_DEPARTMENT_OTHER): Payer: Self-pay

## 2017-10-03 ENCOUNTER — Emergency Department (HOSPITAL_BASED_OUTPATIENT_CLINIC_OR_DEPARTMENT_OTHER)
Admission: EM | Admit: 2017-10-03 | Discharge: 2017-10-03 | Disposition: A | Discharge status: Home / Self Care | Payer: Medicare Other | Attending: Emergency Medicine | Admitting: Emergency Medicine

## 2017-10-03 ENCOUNTER — Other Ambulatory Visit: Payer: Self-pay

## 2017-10-03 ENCOUNTER — Ambulatory Visit: Payer: Self-pay | Admitting: *Deleted

## 2017-10-03 DIAGNOSIS — Z87891 Personal history of nicotine dependence: Secondary | ICD-10-CM | POA: Diagnosis not present

## 2017-10-03 DIAGNOSIS — Z853 Personal history of malignant neoplasm of breast: Secondary | ICD-10-CM | POA: Diagnosis not present

## 2017-10-03 DIAGNOSIS — E860 Dehydration: Secondary | ICD-10-CM | POA: Diagnosis not present

## 2017-10-03 DIAGNOSIS — E039 Hypothyroidism, unspecified: Secondary | ICD-10-CM | POA: Diagnosis not present

## 2017-10-03 DIAGNOSIS — Z79899 Other long term (current) drug therapy: Secondary | ICD-10-CM | POA: Diagnosis not present

## 2017-10-03 DIAGNOSIS — Z7984 Long term (current) use of oral hypoglycemic drugs: Secondary | ICD-10-CM | POA: Insufficient documentation

## 2017-10-03 DIAGNOSIS — R42 Dizziness and giddiness: Secondary | ICD-10-CM | POA: Insufficient documentation

## 2017-10-03 LAB — URINALYSIS, ROUTINE W REFLEX MICROSCOPIC
Bilirubin Urine: NEGATIVE
Glucose, UA: NEGATIVE mg/dL
Hgb urine dipstick: NEGATIVE
Ketones, ur: NEGATIVE mg/dL
Leukocytes, UA: NEGATIVE
Nitrite: NEGATIVE
Protein, ur: NEGATIVE mg/dL
Specific Gravity, Urine: <1.005 — ABNORMAL LOW (ref 1.005–1.030)
pH: 5.5 (ref 5.0–8.0)

## 2017-10-03 LAB — CBG MONITORING, ED: Glucose-Capillary: 152 mg/dL — ABNORMAL HIGH (ref 65–99)

## 2017-10-03 LAB — CBC
HCT: 35.5 % — ABNORMAL LOW (ref 36.0–46.0)
Hemoglobin: 12 g/dL (ref 12.0–15.0)
MCH: 31.9 pg (ref 26.0–34.0)
MCHC: 33.8 g/dL (ref 30.0–36.0)
MCV: 94.4 fL (ref 78.0–100.0)
Platelets: 265 10*3/uL (ref 150–400)
RBC: 3.76 MIL/uL — ABNORMAL LOW (ref 3.87–5.11)
RDW: 13 % (ref 11.5–15.5)
WBC: 13.4 10*3/uL — ABNORMAL HIGH (ref 4.0–10.5)

## 2017-10-03 LAB — BASIC METABOLIC PANEL
Anion gap: 9 (ref 5–15)
BUN: 9 mg/dL (ref 6–20)
CO2: 26 mmol/L (ref 22–32)
Calcium: 8.7 mg/dL — ABNORMAL LOW (ref 8.9–10.3)
Chloride: 100 mmol/L — ABNORMAL LOW (ref 101–111)
Creatinine, Ser: 0.52 mg/dL (ref 0.44–1.00)
GFR calc Af Amer: >60 mL/min (ref >60)
GFR calc non Af Amer: >60 mL/min (ref >60)
Glucose, Bld: 155 mg/dL — ABNORMAL HIGH (ref 65–99)
Potassium: 4 mmol/L (ref 3.5–5.1)
Sodium: 135 mmol/L (ref 135–145)

## 2017-10-03 LAB — TROPONIN I: Troponin I: <0.03 ng/mL (ref <0.03)

## 2017-10-03 MED ORDER — MECLIZINE HCL 12.5 MG PO TABS: 12.5000 mg | ORAL_TABLET | Freq: Three times a day (TID) | ORAL | 0 refills | Status: AC | PRN

## 2017-10-03 MED ORDER — MECLIZINE HCL 12.5 MG PO TABS: 12.5000 mg | ORAL_TABLET | Freq: Three times a day (TID) | ORAL | 0 refills | Status: DC | PRN

## 2017-10-03 MED ORDER — MECLIZINE HCL 25 MG PO TABS: 12.5000 mg | ORAL_TABLET | Freq: Once | ORAL | Status: DC

## 2017-10-03 MED ORDER — SODIUM CHLORIDE 0.9 % IV BOLUS
500.0000 mL | Freq: Once | INTRAVENOUS | Status: AC
  Administered 2017-10-03: 500 mL via INTRAVENOUS

## 2017-10-03 NOTE — ED Provider Notes (Addendum)
[facility] DEPART[facility] Provider Note  CSN: [id number] Arrival date & time: 10/03/17  1445    History   Chief Complaint Chief Complaint  Patient presents with  . Near Syncope    HPI Christina [name] is a 67 y.o. female with a medical history of breast cancer and adrenal insufficiency who presented to the ED for dizziness. Patient states that while getting a massage today she felt like the room was spinning and the massage therapist noted that she was pale. Denies chest pain, SOB, diaphoresis or palpitations during this episode. She endorses having episodes of dizziness when moving from seated or lying down to standing position. She also experiences this when she turns her head quickly from side to side. Denies vision changes, headaches, paresthesias, weakness or gait/coordination/balance issues. Patient has tried nothing prior to coming to the ED. Admits to decreased hydration and feels thirsty and like her mouth is dry.  Additional history obtained by medical chart. Patient has ED triage notes from 09/25/17 and 09/26/17, but was not seen by a provider because she left beforehand. She came to the ED initially on 09/25/17 because she was feeling tired and fell asleep while driving after a day of riding her horse. Between 09/25/17 and today, she denies any episodes of near syncope/syncope, LOC or vertigo.  Past Medical History:  Diagnosis Date  . Abrasion of right leg 12/17/2014  . Adrenal insufficiency ([place])   . Breast cancer of upper-outer quadrant of left female breast ([place]) 12/07/2014  . Cataract, immature    bilateral  . Dental bridge present    upper - x 2  . Dental crowns present   . Hypothyroidism   . Prediabetes     Patient Active Problem List   Diagnosis Date Noted  . Greater trochanteric bursitis of right hip 06/13/2017  . 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 Breast cancer of upper-outer quadrant of left female breast ([place]) 12/07/2014  .  Acute recurrent sinusitis 01/17/2013    Past Surgical History:  Procedure Laterality Date  . ABDOMINOPLASTY    . BALLOON SINUPLASTY    . BREAST LUMPECTOMY WITH RADIOACTIVE SEED AND SENTINEL LYMPH NODE BIOPSY Left 12/22/2014   Procedure: BREAST LUMPECTOMY WITH RADIOACTIVE SEED AND SENTINEL LYMPH NODE BIOPSY;  Surgeon: Stark Klein, MD;  Location: [facility];  Service: General;  Laterality: Left;  . BUNIONECTOMY Right   . HAMMER TOE SURGERY Bilateral   . RHINOPLASTY    . SEPTOPLASTY       OB History   None      Home Medications    Prior to Admission medications   Medication Sig Start Date End Date Taking? Authorizing Provider  albuterol (PROVENTIL HFA;VENTOLIN HFA) 108 (90 BASE) MCG/ACT inhaler Inhale into the lungs every 6 (six) hours as needed for wheezing or shortness of breath.    [provider]  Alpha Lipoic Acid 200 MG CAPS Take 600 mg by mouth 2 (two) times daily.     [provider]  Ascorbic Acid (VITAMIN C) 1000 MG tablet Take 2,000-3,000 mg by mouth 2 (two) times daily. Takes 2000mg  in the morning and 3000mg  at night    [provider]  calcium carbonate (OS-CAL) 600 MG TABS tablet Take 800 mg by mouth 2 (two) times daily with a meal.    [provider]  cetirizine (ZYRTEC) 10 MG tablet Take 10 mg by mouth daily.     [provider]  clonazePAM (  KLONOPIN) 1 MG tablet Take 1 mg by mouth at bedtime.  11/20/12   [provider]  Coenzyme Q10 200 MG capsule Take 200 mg by mouth daily.    [provider]  cyclobenzaprine (FLEXERIL) 10 MG tablet Take 1 tablet (10 mg total) by mouth 3 (three) times daily as needed for muscle spasms. 04/03/17   Lyndal Pulley, DO  Eszopiclone (ESZOPICLONE) 3 MG TABS Take 1.5 mg by mouth at bedtime. Take immediately before bedtime    [provider]  Flaxseed, Linseed, (FLAXSEED OIL) 1000 MG CAPS Take 1,000 mg by mouth daily.     [provider]    fluticasone (FLONASE) 50 MCG/ACT nasal spray Place 1 spray into both nostrils daily. 02/23/15   [provider]  gabapentin (NEURONTIN) 100 MG capsule TAKE 2 CAPSULES BY MOUTH EVERY DAY AT BEDTIME 02/12/17   Lyndal Pulley, DO  guaiFENesin (MUCINEX) 600 MG 12 hr tablet Take 600 mg by mouth daily.     [provider]  ibuprofen (ADVIL,MOTRIN) 200 MG tablet Take 600 mg by mouth every 4 (four) hours as needed for fever, headache, mild pain, moderate pain or cramping.    [provider]  liothyronine (CYTOMEL) 25 MCG tablet Take 50 mcg by mouth daily. 02/03/15   [provider]  MAGNESIUM CITRATE PO Take 2 tablets by mouth daily.    [provider]  MAGNESIUM LACTATE PO Take 2 tablets by mouth at bedtime.    [provider]  meclizine (ANTIVERT) 12.5 MG tablet Take 1 tablet (12.5 mg total) by mouth 3 (three) times daily as needed for up to 14 days for dizziness. 10/03/17 10/17/17  Shadd Dunstan, Alvie Heidelberg I, PA-C  metFORMIN (GLUCOPHAGE-XR) 500 MG 24 hr tablet Take 500-1,000 mg by mouth 3 (three) times daily. Takes 1000mg  in the morning, 500mg  at lunch, and 500mg  with supper 02/08/15   [provider]  methylPREDNISolone (MEDROL) 4 MG tablet Take 2.5-4 mg by mouth 3 (three) times daily. Takes 4mg  in the morning and 2.5mg  midday and at bedtime    [provider]  montelukast (SINGULAIR) 10 MG tablet Take 10 mg by mouth at bedtime.    [provider]  niacin 500 MG tablet Take 500 mg by mouth daily.     [provider]  nortriptyline (PAMELOR) 10 MG capsule Take 2 capsules (20 mg total) at bedtime by mouth. 03/12/17   Lyndal Pulley, DO  Nutritional Supplements (SYTRINOL PO) Take 1 tablet by mouth daily.     [provider]  OVER THE COUNTER MEDICATION Take 1-2 tablets by mouth 2 (two) times daily. *Nutrient 950 with NAC2* takes 2 tablets in the morning and 1 tablet at lunchtime    [provider]  OVER THE  COUNTER MEDICATION Take 2 tablets by mouth daily with breakfast. *LV GB Complex*    [provider]  OVER THE COUNTER MEDICATION Take 1 tablet by mouth 2 (two) times daily. *Regenemax or Biosil*    [provider]  OVER THE COUNTER MEDICATION Take 30 mg by mouth daily. *Borotab*    [provider]  OVER THE COUNTER MEDICATION Take 1 capsule by mouth daily. *Lypozyme*    [provider]  oxyCODONE-acetaminophen (ROXICET) 5-325 MG per tablet Take 1-2 tablets by mouth every 4 (four) hours as needed for severe pain. Patient taking differently: Take 0.25-1 tablets by mouth every 4 (four) hours as needed for severe pain.  12/22/14   Stark Klein, MD  potassium chloride (MICRO-K) 10 MEQ CR capsule Take 2 tabs by mouth twice daily. 01/30/13   [provider]  STRONTIUM GLUCONATE-B6-B12-FA PO Take 2 tablets by mouth daily.     [provider]  Vitamin D, Ergocalciferol, (DRISDOL) 50000 units CAPS capsule Take 50,000 Units by mouth every Sunday.    [provider]  VITAMIN K, PHYTONADIONE, PO Take 2 tablets by mouth daily.     [provider]  zinc gluconate 50 MG tablet Take 50-100 mg by mouth 2 (two) times daily. Takes 100mg  in the morning and 50mg  later in the day    [provider]    Family History Family History  Problem Relation Age of Onset  . Diabetes Mother   . Diabetes Father   . Cancer Maternal Uncle        pancreatic cancer   . Diabetes Paternal Grandmother   . Diabetes Paternal Grandfather     Social History Social History   Tobacco Use  . Smoking status: Former Smoker    Packs/day: 0.00    Years: 0.00    Pack years: 0.00    Last attempt to quit: 05/01/1980    Years since quitting: 37.4  . Smokeless tobacco: Never Used  Substance Use Topics  . Alcohol use: Yes    Alcohol/week: 0.0 oz    Comment: occasionally  . Drug use: No     Allergies   Celery oil; Doxycycline; Rye grass flower pollen  extract [gramineae pollens]; Tea; Adhesive [tape]; and Sulfa antibiotics   Review of Systems Review of Systems  Constitutional: Negative.  Negative for appetite change, fever and unexpected weight change.  HENT: Negative for ear pain, hearing loss and tinnitus.   Eyes: Negative for photophobia and visual disturbance.  Respiratory: Negative for chest tightness and shortness of breath.   Cardiovascular: Negative for chest pain, palpitations and leg swelling.  Endocrine: Negative.   Musculoskeletal: Negative for neck pain and neck stiffness.  Neurological: Positive for dizziness and light-headedness. Negative for syncope, speech difficulty, weakness, numbness and headaches.  Psychiatric/Behavioral: Negative for decreased concentration and sleep disturbance.     Physical Exam Updated Vital Signs BP (!) 147/87 (BP Location: Right Arm)   Pulse (!) 104   Temp 98.4 F (36.9 C) (Oral)   Resp 18   Ht 5\' 3"  (1.6 m)   Wt 49 kg (108 lb)   SpO2 100%   BMI 19.13 kg/m   Physical Exam  Constitutional: She is oriented to person, place, and time. She appears well-developed and well-nourished. She does not appear ill. No distress.  HENT:  Right Ear: External ear normal.  Left Ear: External ear normal.  Mouth/Throat: Uvula is midline and oropharynx is clear and moist. Mucous membranes are not dry.  Eyes: Pupils are equal, round, and reactive to light. Conjunctivae and EOM are normal.  Neck: Normal range of motion. Neck supple. Normal carotid pulses present. Carotid bruit is not present.  Cardiovascular: Normal rate, regular rhythm, normal heart sounds and intact distal pulses.  Pulmonary/Chest: Effort normal and breath sounds normal.  Neurological: She is alert and oriented to person, place, and time. She has normal strength and normal reflexes. No cranial nerve deficit or sensory deficit. She exhibits normal muscle tone. Gait normal. GCS eye subscore is 4. GCS verbal subscore is 5. GCS motor  subscore is 6.  Skin: Skin is dry. Capillary refill takes 2 to 3 seconds.  Decreased skin turgor.  Psychiatric: She has a normal mood and affect.  Her mood appears not anxious. She does not exhibit a depressed mood.  Nursing note and vitals reviewed.    ED Treatments / Results  Labs (all labs ordered are listed, but only abnormal results are displayed) Labs Reviewed  BASIC METABOLIC PANEL - Abnormal; Notable for the following components:      Result Value   Chloride 100 (*)    Glucose, Bld 155 (*)    Calcium 8.7 (*)    All other components within normal limits  CBC - Abnormal; Notable for the following components:   WBC 13.4 (*)    RBC 3.76 (*)    HCT 35.5 (*)    All other components within normal limits  URINALYSIS, ROUTINE W REFLEX MICROSCOPIC - Abnormal; Notable for the following components:   Color, Urine STRAW (*)    Specific Gravity, Urine <1.005 (*)    All other components within normal limits  CBG MONITORING, ED - Abnormal; Notable for the following components:   Glucose-Capillary 152 (*)    All other components within normal limits  TROPONIN I  CBG MONITORING, ED    EKG EKG Interpretation  Date/Time:  Wednesday October 03 2017 14:56:21 EDT Ventricular Rate:  82 PR Interval:  144 QRS Duration: 80 QT Interval:  374 QTC Calculation: 436 R Axis:   48 Text Interpretation:  Normal sinus rhythm Nonspecific ST abnormality No significant change since last tracing Confirmed by Blanchie Dessert [phone]) on 10/03/2017 3:25:50 PM   Radiology No results found.  Procedures Procedures (including critical care time)  Medications Ordered in ED Medications  meclizine (ANTIVERT) tablet 12.5 mg (has no administration in time range)  sodium chloride 0.9 % bolus 500 mL (500 mLs Intravenous New Bag/Given 10/03/17 1804)   Initial Impression / Assessment and Plan / ED Course  Triage vital signs and the nursing notes have been reviewed.  Pertinent labs & imaging results that were  available during care of the patient were reviewed and considered in medical decision making (see chart for details). Clinical Course as of Oct 03 1899  Wed Oct 03, 2017  1615 EKG showed NSR. No ST elevations/depressions or acute ischemia or infarct.   [GM]  1659 1/2L NS bolus x1 administered in ED. Pt has decreased skin turgor and endorses dry mouth and feeling thirsty. Meclizine 12.5mg  x1 administered in ED as pt endorsed dizziness when HEENT being examined.    [GM]  1727 Normal orthostatic vitals: Lying BP 159/79, HR 85; Sitting BP 165/84, HR 87; Standing 166/79, 87   [GM]  1900 Troponin negative. Decreases likelihood that vertigo is cardiac related.   [GM]    Clinical Course User Index [GM] Rasaan Brotherton, Jonelle Sports, [name]   Patient presents in no acute distress and was not symptomatic upon arrival to the ED. Her orthostatic vitals were normal. She denies symptoms of chest pain, SOB, palpitations, leg swelling or focal neuro deficits that would suggest cardiac, pulmonary or neuro etiology. Endorsed some dizziness after HEENT exam. Responded positively to Antivert administered in ED. Patient's symptoms most consistent with peripheral vertigo. Pt has established care with ENT provider at [facility] from past sinus issues and would like to follow-up with them for this issue as well. Education provided on vertigo and OTC vs prescribed therapies for symptom relief.  Final Clinical Impressions(s) / ED Diagnoses  1. Vertigo. Antivert 12.5mg  TID PRN prescribed. Encouraged to increase PO hydration and intake. Advised to follow-up with previous ENT with [facility] if symptoms continue to persists. 2. Dehydration. Labs reassuring,  but physical exam consistent with dehydration. Received normal saline bolus x1 in the ED. Education provided on proper ways to stay hydrated.  Dispo: Home. After thorough clinical evaluation, this patient is determined to be medically stable and can be safely discharged with the  previously mentioned treatment and/or outpatient follow-up/referral(s). At this time, there are no other apparent medical conditions that require further screening, evaluation or treatment.   Final diagnoses:  Vertigo    ED Discharge Orders        Ordered    meclizine (ANTIVERT) 12.5 MG tablet  3 times daily PRN     10/03/17 1738    meclizine (ANTIVERT) 12.5 MG tablet  3 times daily PRN,   Status:  Discontinued     10/03/17 1845        Lexani Corona, Jonelle Sports, PA-C 10/03/17 1901    Wilbur Labuda, Jonelle Sports, PA-C 10/03/17 1903    Blanchie Dessert, MD 10/03/17 [phone]

## 2017-10-03 NOTE — ED Triage Notes (Signed)
Pt states she had near syncopal episode during a massage today approx 930am-pt states she had similar episode last week-was at [facility] ED 5/28 and 5/29 both [facility]-pt NAD-steady gait

## 2017-10-03 NOTE — Telephone Encounter (Signed)
Patient had episode of dizziness at massage today. She has multiple concerns and is trying to establish at [facility]. She thought [facility] was UC. She declines to make an appointment because she just wants UC appointment not PCP.

## 2017-10-03 NOTE — Discharge Instructions (Addendum)
Follow-up with your former ENT, Dr. Tobin Chad, to discuss vertigo.  Stay hydrated and be safe on your rides!

## 2017-10-09 DIAGNOSIS — H8112 Benign paroxysmal vertigo, left ear: Secondary | ICD-10-CM | POA: Diagnosis not present

## 2017-10-09 DIAGNOSIS — R42 Dizziness and giddiness: Secondary | ICD-10-CM | POA: Diagnosis not present

## 2017-10-17 DIAGNOSIS — R42 Dizziness and giddiness: Secondary | ICD-10-CM | POA: Diagnosis not present

## 2017-10-31 DIAGNOSIS — R42 Dizziness and giddiness: Secondary | ICD-10-CM | POA: Diagnosis not present

## 2017-11-12 DIAGNOSIS — C44529 Squamous cell carcinoma of skin of other part of trunk: Secondary | ICD-10-CM | POA: Diagnosis not present

## 2017-11-12 DIAGNOSIS — Z85828 Personal history of other malignant neoplasm of skin: Secondary | ICD-10-CM | POA: Diagnosis not present

## 2017-12-04 DIAGNOSIS — E271 Primary adrenocortical insufficiency: Secondary | ICD-10-CM | POA: Diagnosis not present

## 2017-12-04 DIAGNOSIS — G4700 Insomnia, unspecified: Secondary | ICD-10-CM | POA: Diagnosis not present

## 2017-12-04 DIAGNOSIS — E782 Mixed hyperlipidemia: Secondary | ICD-10-CM | POA: Diagnosis not present

## 2017-12-04 DIAGNOSIS — Z170 Estrogen receptor positive status [ER+]: Secondary | ICD-10-CM | POA: Diagnosis not present

## 2017-12-04 DIAGNOSIS — D649 Anemia, unspecified: Secondary | ICD-10-CM | POA: Diagnosis not present

## 2017-12-04 DIAGNOSIS — Z79899 Other long term (current) drug therapy: Secondary | ICD-10-CM | POA: Diagnosis not present

## 2017-12-04 DIAGNOSIS — Z0000 Encounter for general adult medical examination without abnormal findings: Secondary | ICD-10-CM | POA: Diagnosis not present

## 2017-12-04 DIAGNOSIS — E039 Hypothyroidism, unspecified: Secondary | ICD-10-CM | POA: Diagnosis not present

## 2017-12-04 DIAGNOSIS — M8580 Other specified disorders of bone density and structure, unspecified site: Secondary | ICD-10-CM | POA: Diagnosis not present

## 2017-12-11 DIAGNOSIS — L0390 Cellulitis, unspecified: Secondary | ICD-10-CM | POA: Diagnosis not present

## 2017-12-12 ENCOUNTER — Ambulatory Visit
Admission: RE | Admit: 2017-12-12 | Discharge: 2017-12-12 | Disposition: A | Discharge status: Home / Self Care | Payer: Medicare Other | Source: Ambulatory Visit | Attending: General Surgery | Admitting: General Surgery

## 2017-12-12 DIAGNOSIS — Z9889 Other specified postprocedural states: Secondary | ICD-10-CM

## 2017-12-12 DIAGNOSIS — R928 Other abnormal and inconclusive findings on diagnostic imaging of breast: Secondary | ICD-10-CM | POA: Diagnosis not present

## 2017-12-12 HISTORY — DX: Personal history of irradiation: Z92.3

## 2017-12-13 DIAGNOSIS — Z23 Encounter for immunization: Secondary | ICD-10-CM | POA: Diagnosis not present

## 2017-12-14 DIAGNOSIS — Z853 Personal history of malignant neoplasm of breast: Secondary | ICD-10-CM | POA: Diagnosis not present

## 2017-12-25 DIAGNOSIS — D0461 Carcinoma in situ of skin of right upper limb, including shoulder: Secondary | ICD-10-CM | POA: Diagnosis not present

## 2017-12-25 DIAGNOSIS — L98499 Non-pressure chronic ulcer of skin of other sites with unspecified severity: Secondary | ICD-10-CM | POA: Diagnosis not present

## 2017-12-25 DIAGNOSIS — Z85828 Personal history of other malignant neoplasm of skin: Secondary | ICD-10-CM | POA: Diagnosis not present

## 2017-12-25 DIAGNOSIS — L570 Actinic keratosis: Secondary | ICD-10-CM | POA: Diagnosis not present

## 2017-12-25 DIAGNOSIS — D692 Other nonthrombocytopenic purpura: Secondary | ICD-10-CM | POA: Diagnosis not present

## 2017-12-25 DIAGNOSIS — D225 Melanocytic nevi of trunk: Secondary | ICD-10-CM | POA: Diagnosis not present

## 2017-12-25 DIAGNOSIS — L821 Other seborrheic keratosis: Secondary | ICD-10-CM | POA: Diagnosis not present

## 2017-12-25 DIAGNOSIS — L905 Scar conditions and fibrosis of skin: Secondary | ICD-10-CM | POA: Diagnosis not present

## 2017-12-25 NOTE — Progress Notes (Signed)
[name] [place], [place] [address] Phone: [phone] Subjective:     CC: right hip pain   I [name] am serving as a scribe for Dr. Hulan Saas.  YIR:[id number]  [name] is a 67 y.o. female coming in with complaint of right hip pain. No numbness and tingling noted. No loss of ROM. States that her hip tightens up on her.   Onset- 6 weeks  Location- Glut. Character- Sharp Aggravating factors- Riding horse, walking Reliving factors-  Therapies tried- [employer] out of 10.  Patient does have an MRI though showing a right-sided L3 nerve root impingement.  Patient has responded well to the injections previously.     Past Medical History:  Diagnosis Date  . Abrasion of right leg 12/17/2014  . Adrenal insufficiency ([place])   . Breast cancer of upper-outer quadrant of left female breast ([place] [place]) 12/07/2014  . Cataract, immature    bilateral  . Dental bridge present    upper - x 2  . Dental crowns present   . Hypothyroidism   . Personal history of radiation therapy   . Prediabetes    Past Surgical History:  Procedure Laterality Date  . ABDOMINOPLASTY    . BALLOON SINUPLASTY    . BREAST LUMPECTOMY Left 2016  . BREAST LUMPECTOMY WITH RADIOACTIVE SEED AND SENTINEL LYMPH NODE BIOPSY Left 12/22/2014   Procedure: BREAST LUMPECTOMY WITH RADIOACTIVE SEED AND SENTINEL LYMPH NODE BIOPSY;  Surgeon: Stark Klein, MD;  Location: [facility];  Service: General;  Laterality: Left;  . BUNIONECTOMY Right   . HAMMER TOE SURGERY Bilateral   . RHINOPLASTY    . SEPTOPLASTY     Social History   Socioeconomic History  . Marital status: Widowed    Spouse name: Not on file  . Number of children: Not on file  . Years of education: Not on file  . Highest education level: Not on file  Occupational History    Employer: [employer]  . Financial resource strain: Not on file  . Food insecurity:    Worry:  Not on file    Inability: Not on file  . Transportation needs:    Medical: Not on file    Non-medical: Not on file  Tobacco Use  . Smoking status: Former Smoker    Packs/day: 0.00    Years: 0.00    Pack years: 0.00    Last attempt to quit: 05/01/1980    Years since quitting: 37.6  . Smokeless tobacco: Never Used  Substance and Sexual Activity  . Alcohol use: Yes    Alcohol/week: 0.0 standard drinks    Comment: occasionally  . Drug use: No  . Sexual activity: Not on file  Lifestyle  . Physical activity:    Days per week: Not on file    Minutes per session: Not on file  . Stress: Not on file  Relationships  . Social connections:    Talks on phone: Not on file    Gets together: Not on file    Attends religious service: Not on file    Active member of club or organization: Not on file    Attends meetings of clubs or organizations: Not on file    Relationship status: Not on file  Other Topics Concern  . Not on file  Social History Narrative  . Not on file   Allergies  Allergen Reactions  . Celery Oil Other (See Comments)  FEVER BLISTERS  . Doxycycline Nausea And Vomiting  . Rye Grass Flower Pollen Extract [Gramineae Pollens] Swelling    RYE:  SWELLING THROAT - DENIES SOB  . Tea Swelling    THROAT - DENIES SOB  . Adhesive [Tape] Other (See Comments)    TEARS SKIN  . Sulfa Antibiotics Swelling    NOSE   Family History  Problem Relation Age of Onset  . Diabetes Mother   . Diabetes Father   . Cancer Maternal Uncle        pancreatic cancer   . Diabetes Paternal Grandmother   . Diabetes Paternal Grandfather     Current Outpatient Medications (Endocrine & Metabolic):  .  liothyronine (CYTOMEL) 25 MCG tablet, Take 50 mcg by mouth daily. .  metFORMIN (GLUCOPHAGE-XR) 500 MG 24 hr tablet, Take 500-1,000 mg by mouth 3 (three) times daily. Takes 1000mg  in the morning, 500mg  at lunch, and 500mg  with supper .  methylPREDNISolone (MEDROL) 4 MG tablet, Take 2.5-4 mg by mouth  3 (three) times daily. Takes 4mg  in the morning and 2.5mg  midday and at bedtime  Current Facility-Administered Medications (Endocrine & Metabolic):  .  methylPREDNISolone acetate (DEPO-MEDROL) injection 80 mg    Current Outpatient Medications (Respiratory):  .  albuterol (PROVENTIL HFA;VENTOLIN HFA) 108 (90 BASE) MCG/ACT inhaler, Inhale into the lungs every 6 (six) hours as needed for wheezing or shortness of breath. .  cetirizine (ZYRTEC) 10 MG tablet, Take 10 mg by mouth daily.  .  fluticasone (FLONASE) 50 MCG/ACT nasal spray, Place 1 spray into both nostrils daily. Marland Kitchen  guaiFENesin (MUCINEX) 600 MG 12 hr tablet, Take 600 mg by mouth daily.  .  montelukast (SINGULAIR) 10 MG tablet, Take 10 mg by mouth at bedtime.   Current Outpatient Medications (Analgesics):  .  ibuprofen (ADVIL,MOTRIN) 200 MG tablet, Take 600 mg by mouth every 4 (four) hours as needed for fever, headache, mild pain, moderate pain or cramping. Marland Kitchen  oxyCODONE-acetaminophen (ROXICET) 5-325 MG per tablet, Take 1-2 tablets by mouth every 4 (four) hours as needed for severe pain. (Patient taking differently: Take 0.25-1 tablets by mouth every 4 (four) hours as needed for severe pain. )     Current Outpatient Medications (Other):  Marland Kitchen  Alpha Lipoic Acid 200 MG CAPS, Take 600 mg by mouth 2 (two) times daily.  .  Ascorbic Acid (VITAMIN C) 1000 MG tablet, Take 2,000-3,000 mg by mouth 2 (two) times daily. Takes 2000mg  in the morning and 3000mg  at night .  calcium carbonate (OS-CAL) 600 MG TABS tablet, Take 800 mg by mouth 2 (two) times daily with a meal. .  clonazePAM (KLONOPIN) 1 MG tablet, Take 1 mg by mouth at bedtime.  .  Coenzyme Q10 200 MG capsule, Take 200 mg by mouth daily. .  cyclobenzaprine (FLEXERIL) 10 MG tablet, Take 1 tablet (10 mg total) by mouth 3 (three) times daily as needed for muscle spasms. .  Eszopiclone (ESZOPICLONE) 3 MG TABS, Take 1.5 mg by mouth at bedtime. Take immediately before bedtime .  Flaxseed,  Linseed, (FLAXSEED OIL) 1000 MG CAPS, Take 1,000 mg by mouth daily.  Marland Kitchen  gabapentin (NEURONTIN) 100 MG capsule, TAKE 2 CAPSULES BY MOUTH EVERY DAY AT BEDTIME .  MAGNESIUM CITRATE PO*, Take 2 tablets by mouth daily. Marland Kitchen  MAGNESIUM LACTATE PO, Take 2 tablets by mouth at bedtime. .  niacin 500 MG tablet, Take 500 mg by mouth daily.  .  nortriptyline (PAMELOR) 10 MG capsule, Take 2 capsules (20 mg total) at  bedtime by mouth. .  Nutritional Supplements (SYTRINOL PO), Take 1 tablet by mouth daily.  Marland Kitchen  OVER THE COUNTER MEDICATION, Take 1-2 tablets by mouth 2 (two) times daily. *Nutrient 950 with NAC2* takes 2 tablets in the morning and 1 tablet at lunchtime .  OVER THE COUNTER MEDICATION, Take 2 tablets by mouth daily with breakfast. *LV GB Complex* .  OVER THE COUNTER MEDICATION, Take 1 tablet by mouth 2 (two) times daily. *Regenemax or Biosil* .  OVER THE COUNTER MEDICATION, Take 30 mg by mouth daily. *Borotab* .  OVER THE COUNTER MEDICATION, Take 1 capsule by mouth daily. *Lypozyme* .  potassium chloride (MICRO-K) 10 MEQ CR capsule, Take 2 tabs by mouth twice daily. .  STRONTIUM GLUCONATE-B6-B12-FA PO, Take 2 tablets by mouth daily.  .  Vitamin D, Ergocalciferol, (DRISDOL) 50000 units CAPS capsule, Take 50,000 Units by mouth every Sunday. Marland Kitchen  VITAMIN K, PHYTONADIONE, PO, Take 2 tablets by mouth daily.  Marland Kitchen  zinc gluconate 50 MG tablet, Take 50-100 mg by mouth 2 (two) times daily. Takes 100mg  in the morning and 50mg  later in the day  * These medications belong to multiple therapeutic classes and are listed under each applicable group.    Past medical history, social, surgical and family history all reviewed in electronic medical record.  No pertanent information unless stated regarding to the chief complaint.   Review of Systems:  No headache, visual changes, nausea, vomiting, diarrhea, constipation, dizziness, abdominal pain, skin rash, fevers, chills, night sweats, weight loss, swollen lymph nodes,  body aches, joint swelling,  chest pain, shortness of breath, mood changes.  Positive muscle aches  Objective  Blood pressure 140/70, pulse 94, height 5\' 3"  (1.6 m), weight 119 lb (54 kg), SpO2 98 %. Systems examined below as of    General: No apparent distress alert and oriented x3 mood and affect normal, dressed appropriately.  HEENT: Pupils equal, extraocular movements intact  Respiratory: Patient's speak in full sentences and does not appear short of breath  Cardiovascular: No lower extremity edema, non tender, no erythema  Skin: Warm dry intact with no signs of infection or rash on extremities or on axial skeleton.  Abdomen: Soft nontender  Neuro: Cranial nerves II through XII are intact, neurovascularly intact in all extremities with 2+ DTRs and 2+ pulses.  Lymph: No lymphadenopathy of posterior or anterior cervical chain or axillae bilaterally.  Gait normal with good balance and coordination.  MSK:  Non tender with full range of motion and good stability and symmetric strength and tone of shoulders, elbows, wrist, hip, knee and ankles bilaterally.  Back Exam:  Inspection: Loss of lordosis with some degenerative scoliosis Motion: Flexion 35 deg with worsening pain, Extension 45 deg, Side Bending to 45 deg bilaterally,  Rotation to 45 deg bilaterally  SLR laying: Positive right XSLR laying: Negative  Palpable tenderness: Tender to palpation the paraspinal musculature lumbar spine. FABER: Tightness bilaterally. Sensory change: Gross sensation intact to all lumbar and sacral dermatomes.  Reflexes: 2+ at both patellar tendons, 2+ at achilles tendons, Babinski's downgoing.  Strength at foot  Plantar-flexion: 5/5 Dorsi-flexion: 5/5 Eversion: 5/5 Inversion: 5/5  Leg strength  Quad: 5/5 Hamstring: 5/5 Hip flexor: 5/5 Hip abductors: 4/5 symmetric Gait unremarkable.    Impression and Recommendations:     This case required medical decision making of moderate complexity. The above  documentation has been reviewed and is accurate and complete Lyndal Pulley, DO       Note: This dictation was  prepared with Dragon dictation along with smaller phrase technology. Any transcriptional errors that result from this process are unintentional.

## 2017-12-26 ENCOUNTER — Ambulatory Visit (INDEPENDENT_AMBULATORY_CARE_PROVIDER_SITE_OTHER): Payer: Medicare Other | Admitting: Family Medicine

## 2017-12-26 ENCOUNTER — Encounter: Payer: Self-pay | Admitting: Family Medicine

## 2017-12-26 VITALS — BP 140/70 | HR 94 | Ht 63.0 in | Wt 119.0 lb

## 2017-12-26 DIAGNOSIS — S3421XA Injury of nerve root of lumbar spine, initial encounter: Secondary | ICD-10-CM | POA: Diagnosis not present

## 2017-12-26 NOTE — Assessment & Plan Note (Addendum)
I believe the patient is having exacerbation again.  Discussed with patient about icing regimen, would like patient to repeat the nerve root injection.  Has responded previously.  Patient will have this done as well.  We discussed icing regimen and home exercises.  Discussed which activities to do which wants to avoid.  Patient will follow-up with me again in 2 to 3 weeks after the nerve root injection to see how patient is responding Spent  25 minutes with patient face-to-face and had greater than 50% of counseling including as described above in assessment and plan.

## 2017-12-26 NOTE — Patient Instructions (Signed)
Good to see you  I am sorry you are hurting again  I believe it is your back  Call [phone] and set up with [facility].  See me again in 3 weeks after the injection

## 2018-01-02 DIAGNOSIS — C44529 Squamous cell carcinoma of skin of other part of trunk: Secondary | ICD-10-CM | POA: Diagnosis not present

## 2018-01-02 DIAGNOSIS — L988 Other specified disorders of the skin and subcutaneous tissue: Secondary | ICD-10-CM | POA: Diagnosis not present

## 2018-01-02 DIAGNOSIS — Z85828 Personal history of other malignant neoplasm of skin: Secondary | ICD-10-CM | POA: Diagnosis not present

## 2018-01-09 DIAGNOSIS — M8589 Other specified disorders of bone density and structure, multiple sites: Secondary | ICD-10-CM | POA: Diagnosis not present

## 2018-01-09 LAB — HM DEXA SCAN

## 2018-01-15 ENCOUNTER — Ambulatory Visit
Admission: RE | Admit: 2018-01-15 | Discharge: 2018-01-15 | Disposition: A | Discharge status: Home / Self Care | Payer: Medicare Other | Source: Ambulatory Visit | Attending: Family Medicine | Admitting: Family Medicine

## 2018-01-15 DIAGNOSIS — S3421XA Injury of nerve root of lumbar spine, initial encounter: Secondary | ICD-10-CM

## 2018-01-15 DIAGNOSIS — M47817 Spondylosis without myelopathy or radiculopathy, lumbosacral region: Secondary | ICD-10-CM | POA: Diagnosis not present

## 2018-01-15 MED ORDER — METHYLPREDNISOLONE ACETATE 40 MG/ML INJ SUSP (RADIOLOG
120.0000 mg | Freq: Once | INTRAMUSCULAR | Status: AC
  Administered 2018-01-15: 120 mg via EPIDURAL

## 2018-01-15 MED ORDER — IOPAMIDOL (ISOVUE-M 200) INJECTION 41%
1.0000 mL | Freq: Once | INTRAMUSCULAR | Status: AC
  Administered 2018-01-15: 1 mL via EPIDURAL

## 2018-02-04 NOTE — Progress Notes (Signed)
[name] [address] [place], [place] [address] Phone: [phone] Subjective:   Christina [name], am serving as a scribe for Dr. Hulan Saas.   CC: Back pain follow-up  [id number]  Christina [name] is a 67 y.o. female coming in with complaint of back pain. Epidural helped alleviate her pain. Most of her pain has improved.  Patient states that the back pain is significantly improved at this time.  [place] taking out the trash 2 weeks ago. Landed on left side. Pain over greater trochanter and pain with pigeon pose. Pain is intermittent. Dissipates later in the day after patient has loosened up.  Patient states that it is just sore mostly to touch.  Has full range of motion.  There is a little bit uncomfortable when she gets up from a seated position.  [name] radiation down the leg.       Past Medical History:  Diagnosis Date  . Abrasion of right leg 12/17/2014  . Adrenal insufficiency ([place])   . Breast cancer of upper-outer quadrant of left female breast ([place]) 12/07/2014  . Cataract, immature    bilateral  . Dental bridge present    upper - x 2  . Dental crowns present   . Hypothyroidism   . Personal history of radiation therapy   . Prediabetes    Past Surgical History:  Procedure Laterality Date  . ABDOMINOPLASTY    . BALLOON SINUPLASTY    . BREAST LUMPECTOMY Left 2016  . BREAST LUMPECTOMY WITH RADIOACTIVE SEED AND SENTINEL LYMPH NODE BIOPSY Left 12/22/2014   Procedure: BREAST LUMPECTOMY WITH RADIOACTIVE SEED AND SENTINEL LYMPH NODE BIOPSY;  Surgeon: Stark Klein, MD;  Location: [facility];  Service: General;  Laterality: Left;  . BUNIONECTOMY Right   . HAMMER TOE SURGERY Bilateral   . RHINOPLASTY    . SEPTOPLASTY     Social History   Socioeconomic History  . Marital status: Widowed    Spouse name: Not on file  . Number of children: Not on file  . Years of education: Not on file  . Highest education level: Not on file    Occupational History    Employer: [employer]  . Financial resource strain: Not on file  . Food insecurity:    Worry: Not on file    Inability: Not on file  . Transportation needs:    Medical: Not on file    Non-medical: Not on file  Tobacco Use  . Smoking status: Former Smoker    Packs/day: 0.00    Years: 0.00    Pack years: 0.00    Last attempt to quit: 05/01/1980    Years since quitting: 37.7  . Smokeless tobacco: Never Used  Substance and Sexual Activity  . Alcohol use: Yes    Alcohol/week: 0.0 standard drinks    Comment: occasionally  . Drug use: [name]  . Sexual activity: Not on file  Lifestyle  . Physical activity:    Days per week: Not on file    Minutes per session: Not on file  . Stress: Not on file  Relationships  . Social connections:    Talks on phone: Not on file    Gets together: Not on file    Attends religious service: Not on file    Active member of club or organization: Not on file    Attends meetings of clubs or organizations: Not on file    Relationship status: Not on  file  Other Topics Concern  . Not on file  Social History Narrative  . Not on file   Allergies  Allergen Reactions  . Celery Oil Other (See Comments)    FEVER BLISTERS  . Doxycycline Nausea And Vomiting  . Rye Grass Flower Pollen Extract [Gramineae Pollens] Swelling    RYE:  SWELLING THROAT - DENIES SOB  . Sulfa Antibiotics Swelling    NOSE  . Tea Swelling    THROAT - DENIES SOB  . Adhesive [Tape] Other (See Comments)    TEARS SKIN   Family History  Problem Relation Age of Onset  . Diabetes Mother   . Diabetes Father   . Cancer Maternal Uncle        pancreatic cancer   . Diabetes Paternal Grandmother   . Diabetes Paternal Grandfather     Current Outpatient Medications (Endocrine & Metabolic):  .  liothyronine (CYTOMEL) 25 MCG tablet, Take 50 mcg by mouth daily. .  metFORMIN (GLUCOPHAGE-XR) 500 MG 24 hr tablet, Take 500-1,000 mg by mouth 3 (three) times  daily. Takes 1000mg  in the morning, 500mg  at lunch, and 500mg  with supper .  methylPREDNISolone (MEDROL) 4 MG tablet, Take 2.5-4 mg by mouth 3 (three) times daily. Takes 4mg  in the morning and 2.5mg  midday and at bedtime  Current Facility-Administered Medications (Endocrine & Metabolic):  .  methylPREDNISolone acetate (DEPO-MEDROL) injection 80 mg    Current Outpatient Medications (Respiratory):  .  albuterol (PROVENTIL HFA;VENTOLIN HFA) 108 (90 BASE) MCG/ACT inhaler, Inhale into the lungs every 6 (six) hours as needed for wheezing or shortness of breath. .  cetirizine (ZYRTEC) 10 MG tablet, Take 10 mg by mouth daily.  .  fluticasone (FLONASE) 50 MCG/ACT nasal spray, Place 1 spray into both nostrils daily. Marland Kitchen  guaiFENesin (MUCINEX) 600 MG 12 hr tablet, Take 600 mg by mouth daily.  .  montelukast (SINGULAIR) 10 MG tablet, Take 10 mg by mouth at bedtime.   Current Outpatient Medications (Analgesics):  .  ibuprofen (ADVIL,MOTRIN) 200 MG tablet, Take 600 mg by mouth every 4 (four) hours as needed for fever, headache, mild pain, moderate pain or cramping. Marland Kitchen  oxyCODONE-acetaminophen (ROXICET) 5-325 MG per tablet, Take 1-2 tablets by mouth every 4 (four) hours as needed for severe pain. (Patient taking differently: Take 0.25-1 tablets by mouth every 4 (four) hours as needed for severe pain. )     Current Outpatient Medications (Other):  Marland Kitchen  Alpha Lipoic Acid 200 MG CAPS, Take 600 mg by mouth 2 (two) times daily.  .  Ascorbic Acid (VITAMIN C) 1000 MG tablet, Take 2,000-3,000 mg by mouth 2 (two) times daily. Takes 2000mg  in the morning and 3000mg  at night .  calcium carbonate (OS-CAL) 600 MG TABS tablet, Take 800 mg by mouth 2 (two) times daily with a meal. .  clonazePAM (KLONOPIN) 1 MG tablet, Take 1 mg by mouth at bedtime.  .  Coenzyme Q10 200 MG capsule, Take 200 mg by mouth daily. .  cyclobenzaprine (FLEXERIL) 10 MG tablet, Take 1 tablet (10 mg total) by mouth 3 (three) times daily as needed  for muscle spasms. .  Eszopiclone (ESZOPICLONE) 3 MG TABS, Take 1.5 mg by mouth at bedtime. Take immediately before bedtime .  Flaxseed, Linseed, (FLAXSEED OIL) 1000 MG CAPS, Take 1,000 mg by mouth daily.  Marland Kitchen  gabapentin (NEURONTIN) 100 MG capsule, TAKE 2 CAPSULES BY MOUTH EVERY DAY AT BEDTIME .  MAGNESIUM CITRATE PO*, Take 2 tablets by mouth daily. Marland Kitchen  MAGNESIUM LACTATE  PO, Take 2 tablets by mouth at bedtime. .  niacin 500 MG tablet, Take 500 mg by mouth daily.  .  nortriptyline (PAMELOR) 10 MG capsule, Take 2 capsules (20 mg total) at bedtime by mouth. .  Nutritional Supplements (SYTRINOL PO), Take 1 tablet by mouth daily.  Marland Kitchen  OVER THE COUNTER MEDICATION, Take 1-2 tablets by mouth 2 (two) times daily. *Nutrient 950 with NAC2* takes 2 tablets in the morning and 1 tablet at lunchtime .  OVER THE COUNTER MEDICATION, Take 2 tablets by mouth daily with breakfast. *LV GB Complex* .  OVER THE COUNTER MEDICATION, Take 1 tablet by mouth 2 (two) times daily. *Regenemax or Biosil* .  OVER THE COUNTER MEDICATION, Take 30 mg by mouth daily. *Borotab* .  OVER THE COUNTER MEDICATION, Take 1 capsule by mouth daily. *Lypozyme* .  potassium chloride (MICRO-K) 10 MEQ CR capsule, Take 2 tabs by mouth twice daily. .  STRONTIUM GLUCONATE-B6-B12-FA PO, Take 2 tablets by mouth daily.  .  Vitamin D, Ergocalciferol, (DRISDOL) 50000 units CAPS capsule, Take 50,000 Units by mouth every Sunday. Marland Kitchen  VITAMIN K, PHYTONADIONE, PO, Take 2 tablets by mouth daily.  Marland Kitchen  zinc gluconate 50 MG tablet, Take 50-100 mg by mouth 2 (two) times daily. Takes 100mg  in the morning and 50mg  later in the day  * These medications belong to multiple therapeutic classes and are listed under each applicable group.    Past medical history, social, surgical and family history all reviewed in electronic medical record.  [name] pertanent information unless stated regarding to the chief complaint.   Review of Systems:  [name] headache, visual changes,  nausea, vomiting, diarrhea, constipation, dizziness, abdominal pain, skin rash, fevers, chills, night sweats, weight loss, swollen lymph nodes, body aches, joint swelling, chest pain, shortness of breath, mood changes.  Positive muscle aches  Objective  Blood pressure 108/64, pulse 62, height 5\' 3"  (1.6 m), weight 106 lb (48.1 kg).     General: [name] apparent distress alert and oriented x3 mood and affect normal, dressed appropriately.  Cachectic underweight HEENT: Pupils equal, extraocular movements intact  Respiratory: Patient's speak in full sentences and does not appear short of breath  Cardiovascular: [name] lower extremity edema, non tender, [name] erythema  Skin: Warm dry intact with [name] signs of infection or rash on extremities or on axial skeleton.  Abdomen: Soft nontender  Neuro: Cranial nerves II through XII are intact, neurovascularly intact in all extremities with 2+ DTRs and 2+ pulses.  Lymph: [name] lymphadenopathy of posterior or anterior cervical chain or axillae bilaterally.  Gait mild antalgic MSK:  Non tender with full range of motion and good stability and symmetric strength and tone of shoulders, elbows, wrist, hip, knee and ankles bilaterally.  Back exam does show some mild loss of lordosis.  Degenerative scoliosis.  Mild tightness in the hamstrings.  Mild positive Corky Sox on the left side.  Does have a small bruise on the lateral hip    Impression and Recommendations:     The above documentation has been reviewed and is accurate and complete Lyndal Pulley, DO       Note: This dictation was prepared with Dragon dictation along with smaller phrase technology. Any transcriptional errors that result from this process are unintentional.

## 2018-02-05 ENCOUNTER — Ambulatory Visit (INDEPENDENT_AMBULATORY_CARE_PROVIDER_SITE_OTHER): Payer: Medicare Other | Admitting: Family Medicine

## 2018-02-05 ENCOUNTER — Encounter: Payer: Self-pay | Admitting: Family Medicine

## 2018-02-05 DIAGNOSIS — S3421XD Injury of nerve root of lumbar spine, subsequent encounter: Secondary | ICD-10-CM | POA: Diagnosis not present

## 2018-02-05 NOTE — Assessment & Plan Note (Signed)
Patient is doing much better after the injections.  Discussed icing regimen and home exercise.  Discussed which activities of doing which wants to avoid.  Patient is listed as well can follow-up as needed

## 2018-02-05 NOTE — Patient Instructions (Addendum)
Good to see you  Ice is your friend Stay active See who is taking new patients  See me when you need me

## 2018-02-07 DIAGNOSIS — E039 Hypothyroidism, unspecified: Secondary | ICD-10-CM | POA: Diagnosis not present

## 2018-02-07 DIAGNOSIS — R899 Unspecified abnormal finding in specimens from other organs, systems and tissues: Secondary | ICD-10-CM | POA: Diagnosis not present

## 2018-02-21 ENCOUNTER — Encounter: Payer: Self-pay | Admitting: Family

## 2018-02-21 NOTE — Progress Notes (Signed)
Outside notes received. Information abstracted. Notes sent to scan.  

## 2018-02-24 NOTE — Progress Notes (Signed)
[name] Christina [name], [place] [address] Phone: [phone] Subjective:   Christina [name], am serving as a scribe for Dr. Hulan Saas.   CC: Left hip pain  [id number]  Christina [name] is a 67 y.o. female coming in with complaint of left hip flexor pain. Pain resultant from fall one month ago. Pain is intermittent. Walking increases pain. Has tried ice but it increased her pain. Uses heat at night.  Patient states that is worsening overall.  Even affecting the way she walks.  Pain can be throbbing aching sensation at night.  Patient does notice mild mild loss of range of motion.  Patient is having difficulty with getting on and off of her horse.       Past Medical History:  Diagnosis Date  . Abrasion of right leg 12/17/2014  . Adrenal insufficiency ([place])   . Breast cancer of upper-outer quadrant of left female breast ([place]) 12/07/2014  . Cataract, immature    bilateral  . Dental bridge present    upper - x 2  . Dental crowns present   . Hypothyroidism   . Personal history of radiation therapy   . Prediabetes    Past Surgical History:  Procedure Laterality Date  . ABDOMINOPLASTY    . BALLOON SINUPLASTY    . BREAST LUMPECTOMY Left 2016  . BREAST LUMPECTOMY WITH RADIOACTIVE SEED AND SENTINEL LYMPH NODE BIOPSY Left 12/22/2014   Procedure: BREAST LUMPECTOMY WITH RADIOACTIVE SEED AND SENTINEL LYMPH NODE BIOPSY;  Surgeon: Stark Klein, MD;  Location: [facility];  Service: General;  Laterality: Left;  . BUNIONECTOMY Right   . HAMMER TOE SURGERY Bilateral   . RHINOPLASTY    . SEPTOPLASTY     Social History   Socioeconomic History  . Marital status: Widowed    Spouse name: Not on file  . Number of children: Not on file  . Years of education: Not on file  . Highest education level: Not on file  Occupational History    Employer: [employer]  . Financial resource strain: Not on file  . Food  insecurity:    Worry: Not on file    Inability: Not on file  . Transportation needs:    Medical: Not on file    Non-medical: Not on file  Tobacco Use  . Smoking status: Former Smoker    Packs/day: 0.00    Years: 0.00    Pack years: 0.00    Last attempt to quit: 05/01/1980    Years since quitting: 37.8  . Smokeless tobacco: Never Used  Substance and Sexual Activity  . Alcohol use: Yes    Alcohol/week: 0.0 standard drinks    Comment: occasionally  . Drug use: [name]  . Sexual activity: Not on file  Lifestyle  . Physical activity:    Days per week: Not on file    Minutes per session: Not on file  . Stress: Not on file  Relationships  . Social connections:    Talks on phone: Not on file    Gets together: Not on file    Attends religious service: Not on file    Active member of club or organization: Not on file    Attends meetings of clubs or organizations: Not on file    Relationship status: Not on file  Other Topics Concern  . Not on file  Social History Narrative  . Not on file   Allergies  Allergen  Reactions  . Celery Oil Other (See Comments)    FEVER BLISTERS  . Doxycycline Nausea And Vomiting  . Rye Grass Flower Pollen Extract [Gramineae Pollens] Swelling    RYE:  SWELLING THROAT - DENIES SOB  . Sulfa Antibiotics Swelling    NOSE  . Tea Swelling    THROAT - DENIES SOB  . Adhesive [Tape] Other (See Comments)    TEARS SKIN   Family History  Problem Relation Age of Onset  . Diabetes Mother   . Diabetes Father   . Cancer Maternal Uncle        pancreatic cancer   . Diabetes Paternal Grandmother   . Diabetes Paternal Grandfather     Current Outpatient Medications (Endocrine & Metabolic):  .  liothyronine (CYTOMEL) 25 MCG tablet, Take 50 mcg by mouth daily. .  metFORMIN (GLUCOPHAGE-XR) 500 MG 24 hr tablet, Take 500-1,000 mg by mouth 3 (three) times daily. Takes 1000mg  in the morning, 500mg  at lunch, and 500mg  with supper .  methylPREDNISolone (MEDROL) 4 MG tablet,  Take 2.5-4 mg by mouth 3 (three) times daily. Takes 4mg  in the morning and 2.5mg  midday and at bedtime  Current Facility-Administered Medications (Endocrine & Metabolic):  .  methylPREDNISolone acetate (DEPO-MEDROL) injection 80 mg    Current Outpatient Medications (Respiratory):  .  albuterol (PROVENTIL HFA;VENTOLIN HFA) 108 (90 BASE) MCG/ACT inhaler, Inhale into the lungs every 6 (six) hours as needed for wheezing or shortness of breath. .  cetirizine (ZYRTEC) 10 MG tablet, Take 10 mg by mouth daily.  .  fluticasone (FLONASE) 50 MCG/ACT nasal spray, Place 1 spray into both nostrils daily. Marland Kitchen  guaiFENesin (MUCINEX) 600 MG 12 hr tablet, Take 600 mg by mouth daily.  .  montelukast (SINGULAIR) 10 MG tablet, Take 10 mg by mouth at bedtime.   Current Outpatient Medications (Analgesics):  .  ibuprofen (ADVIL,MOTRIN) 200 MG tablet, Take 600 mg by mouth every 4 (four) hours as needed for fever, headache, mild pain, moderate pain or cramping. Marland Kitchen  oxyCODONE-acetaminophen (ROXICET) 5-325 MG per tablet, Take 1-2 tablets by mouth every 4 (four) hours as needed for severe pain. (Patient taking differently: Take 0.25-1 tablets by mouth every 4 (four) hours as needed for severe pain. ) .  traMADol (ULTRAM) 50 MG tablet, Take 1 tablet (50 mg total) by mouth every 6 (six) hours as needed.     Current Outpatient Medications (Other):  Marland Kitchen  Alpha Lipoic Acid 200 MG CAPS, Take 600 mg by mouth 2 (two) times daily.  .  Ascorbic Acid (VITAMIN C) 1000 MG tablet, Take 2,000-3,000 mg by mouth 2 (two) times daily. Takes 2000mg  in the morning and 3000mg  at night .  calcium carbonate (OS-CAL) 600 MG TABS tablet, Take 800 mg by mouth 2 (two) times daily with a meal. .  clonazePAM (KLONOPIN) 1 MG tablet, Take 1 mg by mouth at bedtime.  .  Coenzyme Q10 200 MG capsule, Take 200 mg by mouth daily. .  cyclobenzaprine (FLEXERIL) 10 MG tablet, Take 1 tablet (10 mg total) by mouth 3 (three) times daily as needed for muscle  spasms. .  Eszopiclone (ESZOPICLONE) 3 MG TABS, Take 1.5 mg by mouth at bedtime. Take immediately before bedtime .  Flaxseed, Linseed, (FLAXSEED OIL) 1000 MG CAPS, Take 1,000 mg by mouth daily.  Marland Kitchen  gabapentin (NEURONTIN) 100 MG capsule, TAKE 2 CAPSULES BY MOUTH EVERY DAY AT BEDTIME .  MAGNESIUM CITRATE PO*, Take 2 tablets by mouth daily. Marland Kitchen  MAGNESIUM LACTATE PO, Take 2  tablets by mouth at bedtime. .  niacin 500 MG tablet, Take 500 mg by mouth daily.  .  nortriptyline (PAMELOR) 10 MG capsule, Take 2 capsules (20 mg total) at bedtime by mouth. .  Nutritional Supplements (SYTRINOL PO), Take 1 tablet by mouth daily.  Marland Kitchen  OVER THE COUNTER MEDICATION, Take 1-2 tablets by mouth 2 (two) times daily. *Nutrient 950 with NAC2* takes 2 tablets in the morning and 1 tablet at lunchtime .  OVER THE COUNTER MEDICATION, Take 2 tablets by mouth daily with breakfast. *LV GB Complex* .  OVER THE COUNTER MEDICATION, Take 1 tablet by mouth 2 (two) times daily. *Regenemax or Biosil* .  OVER THE COUNTER MEDICATION, Take 30 mg by mouth daily. *Borotab* .  OVER THE COUNTER MEDICATION, Take 1 capsule by mouth daily. *Lypozyme* .  potassium chloride (MICRO-K) 10 MEQ CR capsule, Take 2 tabs by mouth twice daily. .  STRONTIUM GLUCONATE-B6-B12-FA PO, Take 2 tablets by mouth daily.  .  Vitamin D, Ergocalciferol, (DRISDOL) 50000 units CAPS capsule, Take 50,000 Units by mouth every Sunday. Marland Kitchen  VITAMIN K, PHYTONADIONE, PO, Take 2 tablets by mouth daily.  Marland Kitchen  zinc gluconate 50 MG tablet, Take 50-100 mg by mouth 2 (two) times daily. Takes 100mg  in the morning and 50mg  later in the day  * These medications belong to multiple therapeutic classes and are listed under each applicable group.    Past medical history, social, surgical and family history all reviewed in electronic medical record.  [name] pertanent information unless stated regarding to the chief complaint.   Review of Systems:  [name] headache, visual changes, nausea, vomiting,  diarrhea, constipation, dizziness, abdominal pain, skin rash, fevers, chills, night sweats, weight loss, swollen lymph nodes, body aches, joint swelling,  chest pain, shortness of breath, mood changes.  Positive muscle aches  Objective  Blood pressure 118/74, height 5\' 3"  (1.6 m), weight 106 lb (48.1 kg).   General: [name] apparent distress alert and oriented x3 mood and affect normal, dressed appropriately.  Patient is underweight HEENT: Pupils equal, extraocular movements intact  Respiratory: Patient's speak in full sentences and does not appear short of breath  Cardiovascular: [name] lower extremity edema, non tender, [name] erythema  Skin: Warm dry intact with [name] signs of infection or rash on extremities or on axial skeleton.  Abdomen: Soft nontender  Neuro: Cranial nerves II through XII are intact, neurovascularly intact in all extremities with 2+ DTRs and 2+ pulses.  Lymph: [name] lymphadenopathy of posterior or anterior cervical chain or axillae bilaterally.  Gait n severely antalgic MSK:  Non tender with full range of motion and good stability and symmetric strength and tone of shoulders, elbows, wrist,  knee and ankles bilaterally.  Left hip exam shows the patient does have decreasing range of motion in all planes but severely in internal range of motion.  Only less than 5 degrees of internal range of motion with increasing discomfort and pain.  [name] pain with resisted adduction of the hip though.  Patient has a mild 4+ out of 5 strength of the hip flexors but this is symmetric bilaterally.  Neurovascular intact distally.  Still tenderness to palpation in the paraspinal musculature lumbar spine  [id number]; 15 additional minutes spent for Therapeutic exercises as stated in above notes.  This included exercises focusing on stretching, strengthening, with significant focus on eccentric aspects.   Long term goals include an improvement in range of motion, strength, endurance as well as avoiding reinjury. Patient's  frequency would include  in 1-2 times a day, 3-5 times a week for a duration of 6-12 weeks.  Hip strengthening exercises which included:  Pelvic tilt/bracing to help with proper recruitment of the lower abs and pelvic floor muscles  Glute strengthening to properly contract glutes without over-engaging low back and hamstrings - prone hip extension and glute bridge exercises Proper stretching techniques to increase effectiveness for the hip flexors, groin, quads, piriformic and low back when appropriate   Proper technique shown and discussed handout in great detail with ATC.  All questions were discussed and answered.     Impression and Recommendations:     This case required medical decision making of moderate complexity. The above documentation has been reviewed and is accurate and complete Lyndal Pulley, DO       Note: This dictation was prepared with Dragon dictation along with smaller phrase technology. Any transcriptional errors that result from this process are unintentional.

## 2018-02-26 ENCOUNTER — Ambulatory Visit (INDEPENDENT_AMBULATORY_CARE_PROVIDER_SITE_OTHER)
Admission: RE | Admit: 2018-02-26 | Discharge: 2018-02-26 | Disposition: A | Discharge status: Home / Self Care | Payer: Medicare Other | Source: Ambulatory Visit | Attending: Family Medicine | Admitting: Family Medicine

## 2018-02-26 ENCOUNTER — Encounter: Payer: Self-pay | Admitting: Family Medicine

## 2018-02-26 ENCOUNTER — Ambulatory Visit (INDEPENDENT_AMBULATORY_CARE_PROVIDER_SITE_OTHER): Payer: Medicare Other | Admitting: Family Medicine

## 2018-02-26 VITALS — BP 118/74 | Ht 63.0 in | Wt 106.0 lb

## 2018-02-26 DIAGNOSIS — M25552 Pain in left hip: Secondary | ICD-10-CM | POA: Diagnosis not present

## 2018-02-26 DIAGNOSIS — S79912A Unspecified injury of left hip, initial encounter: Secondary | ICD-10-CM | POA: Diagnosis not present

## 2018-02-26 MED ORDER — TRAMADOL HCL 50 MG PO TABS: 50.0000 mg | ORAL_TABLET | Freq: Four times a day (QID) | ORAL | 0 refills | Status: DC | PRN

## 2018-02-26 NOTE — Patient Instructions (Signed)
Good to see you  Ice is your friend Tramadol up to 2 times a day but try of course to limit  Xrays downstairs Depending on how bad it is we will discuss  Have a follow up with me scheduled just in case in 4 weeks

## 2018-02-26 NOTE — Assessment & Plan Note (Signed)
Left hip pain.  Patient is having significant amount of difficulty.  Discussed with patient in great length.  We discussed icing regimen and home exercise.  Discussed which activities of doing which was to avoid.  I am concerned the patient does have arthritis.  X-rays are pending.  New exercises given.  Tramadol for breakthrough pain given.  Already has a muscle relaxer, try gabapentin, ibuprofen does not seem to be beneficial.  Discussed with patient that there is a good chance that surgical intervention will be necessary.  Patient did do prednisone long-term.

## 2018-03-04 ENCOUNTER — Telehealth: Payer: Self-pay | Admitting: Family Medicine

## 2018-03-04 NOTE — Telephone Encounter (Signed)
Copied from [place] [id number]. Topic: Quick Communication - Rx Refill/Question >> Mar 04, 2018  9:36 AM Gardiner Ramus wrote: Medication:traMADol (ULTRAM) 50 MG tablet [[id number]]  pt called and stated that she feels like she should take more. Pt would like a call back regarding medication. She would like a refill

## 2018-03-04 NOTE — Telephone Encounter (Signed)
Left message for patient to call back  

## 2018-03-04 NOTE — Telephone Encounter (Signed)
No- if need more need to go to emergency room or go to pain management  Hip xray fairly normal pain out of proportion to what we see and should be getting better May need MRI of hip

## 2018-03-05 ENCOUNTER — Other Ambulatory Visit: Payer: Self-pay

## 2018-03-05 DIAGNOSIS — M25551 Pain in right hip: Secondary | ICD-10-CM

## 2018-03-05 DIAGNOSIS — M25552 Pain in left hip: Secondary | ICD-10-CM

## 2018-03-05 NOTE — Telephone Encounter (Signed)
Spoke with patient and she would like to get an MRI. Order placed.

## 2018-03-09 ENCOUNTER — Ambulatory Visit
Admission: RE | Admit: 2018-03-09 | Discharge: 2018-03-09 | Disposition: A | Discharge status: Home / Self Care | Payer: Medicare Other | Source: Ambulatory Visit | Attending: Family Medicine | Admitting: Family Medicine

## 2018-03-09 DIAGNOSIS — M25552 Pain in left hip: Secondary | ICD-10-CM

## 2018-03-09 DIAGNOSIS — S76312A Strain of muscle, fascia and tendon of the posterior muscle group at thigh level, left thigh, initial encounter: Secondary | ICD-10-CM | POA: Diagnosis not present

## 2018-03-11 ENCOUNTER — Telehealth: Payer: Self-pay | Admitting: Family Medicine

## 2018-03-11 ENCOUNTER — Other Ambulatory Visit: Payer: Self-pay

## 2018-03-11 DIAGNOSIS — M25552 Pain in left hip: Secondary | ICD-10-CM

## 2018-03-11 MED ORDER — TRAMADOL HCL 50 MG PO TABS: 50.0000 mg | ORAL_TABLET | Freq: Four times a day (QID) | ORAL | 0 refills | Status: DC | PRN

## 2018-03-11 NOTE — Telephone Encounter (Signed)
Spoke with patient about results. She would like a referral to ortho and to come in for an injection. She also would like tramadol until her appointment next week. Please advise.

## 2018-03-11 NOTE — Telephone Encounter (Signed)
Copied from [place] [id number]. Topic: Quick Communication - See Telephone Encounter >> Mar 11, 2018  2:01 PM Vernona Rieger wrote: CRM for notification. See Telephone encounter for: 03/11/18.  Patient seen her MRI results in mychart and would like Dr Tamala Julian or his nurse give her a call back. Thanks

## 2018-03-18 DIAGNOSIS — L03114 Cellulitis of left upper limb: Secondary | ICD-10-CM | POA: Diagnosis not present

## 2018-03-19 DIAGNOSIS — H2513 Age-related nuclear cataract, bilateral: Secondary | ICD-10-CM | POA: Diagnosis not present

## 2018-03-19 DIAGNOSIS — H25013 Cortical age-related cataract, bilateral: Secondary | ICD-10-CM | POA: Diagnosis not present

## 2018-03-19 DIAGNOSIS — H04123 Dry eye syndrome of bilateral lacrimal glands: Secondary | ICD-10-CM | POA: Diagnosis not present

## 2018-03-19 DIAGNOSIS — H5213 Myopia, bilateral: Secondary | ICD-10-CM | POA: Diagnosis not present

## 2018-03-19 NOTE — Progress Notes (Signed)
[name] [address] [place], [place] [address] Phone: [phone] Subjective:    I'm seeing this patient by the request  of:    I [name] am serving as a scribe for Dr. Hulan Saas.   CC: Left hip pain  [id number]  Christina [name] is a 67 y.o. female coming in with complaint of hip pain. Here for MRI results. Cellulitis in the left hand since Monday. Hasn't thought about hip pain due to the cellulitis.  Patient was found to have left hip pain.  Patient was sent for an MRI.  MRI showed a degenerative disc labral tear as well as moderate arthritic changes.  Did have an acute gluteal tear but patient is having no tenderness posteriorly.     Past Medical History:  Diagnosis Date  . Abrasion of right leg 12/17/2014  . Adrenal insufficiency ([place])   . Breast cancer of upper-outer quadrant of left female breast ([facility]) 12/07/2014  . Cataract, immature    bilateral  . Dental bridge present    upper - x 2  . Dental crowns present   . Hypothyroidism   . Personal history of radiation therapy   . Prediabetes    Past Surgical History:  Procedure Laterality Date  . ABDOMINOPLASTY    . BALLOON SINUPLASTY    . BREAST LUMPECTOMY Left 2016  . BREAST LUMPECTOMY WITH RADIOACTIVE SEED AND SENTINEL LYMPH NODE BIOPSY Left 12/22/2014   Procedure: BREAST LUMPECTOMY WITH RADIOACTIVE SEED AND SENTINEL LYMPH NODE BIOPSY;  Surgeon: Stark Klein, MD;  Location: [facility];  Service: General;  Laterality: Left;  . BUNIONECTOMY Right   . HAMMER TOE SURGERY Bilateral   . RHINOPLASTY    . SEPTOPLASTY     Social History   Socioeconomic History  . Marital status: Widowed    Spouse name: Not on file  . Number of children: Not on file  . Years of education: Not on file  . Highest education level: Not on file  Occupational History    Employer: [employer]  . Financial resource strain: Not on file  . Food insecurity:   Worry: Not on file    Inability: Not on file  . Transportation needs:    Medical: Not on file    Non-medical: Not on file  Tobacco Use  . Smoking status: Former Smoker    Packs/day: 0.00    Years: 0.00    Pack years: 0.00    Last attempt to quit: 05/01/1980    Years since quitting: 37.9  . Smokeless tobacco: Never Used  Substance and Sexual Activity  . Alcohol use: Yes    Alcohol/week: 0.0 standard drinks    Comment: occasionally  . Drug use: No  . Sexual activity: Not on file  Lifestyle  . Physical activity:    Days per week: Not on file    Minutes per session: Not on file  . Stress: Not on file  Relationships  . Social connections:    Talks on phone: Not on file    Gets together: Not on file    Attends religious service: Not on file    Active member of club or organization: Not on file    Attends meetings of clubs or organizations: Not on file    Relationship status: Not on file  Other Topics Concern  . Not on file  Social History Narrative  . Not on file   Allergies  Allergen Reactions  .  Celery Oil Other (See Comments)    FEVER BLISTERS  . Doxycycline Nausea And Vomiting  . Rye Grass Flower Pollen Extract [Gramineae Pollens] Swelling    RYE:  SWELLING THROAT - DENIES SOB  . Sulfa Antibiotics Swelling    NOSE  . Tea Swelling    THROAT - DENIES SOB  . Adhesive [Tape] Other (See Comments)    TEARS SKIN   Family History  Problem Relation Age of Onset  . Diabetes Mother   . Diabetes Father   . Cancer Maternal Uncle        pancreatic cancer   . Diabetes Paternal Grandmother   . Diabetes Paternal Grandfather     Current Outpatient Medications (Endocrine & Metabolic):  .  liothyronine (CYTOMEL) 25 MCG tablet, Take 50 mcg by mouth daily. .  metFORMIN (GLUCOPHAGE-XR) 500 MG 24 hr tablet, Take 500-1,000 mg by mouth 3 (three) times daily. Takes 1000mg  in the morning, 500mg  at lunch, and 500mg  with supper .  methylPREDNISolone (MEDROL) 4 MG tablet, Take 2.5-4 mg  by mouth 3 (three) times daily. Takes 4mg  in the morning and 2.5mg  midday and at bedtime  Current Facility-Administered Medications (Endocrine & Metabolic):  .  methylPREDNISolone acetate (DEPO-MEDROL) injection 80 mg    Current Outpatient Medications (Respiratory):  .  albuterol (PROVENTIL HFA;VENTOLIN HFA) 108 (90 BASE) MCG/ACT inhaler, Inhale into the lungs every 6 (six) hours as needed for wheezing or shortness of breath. .  cetirizine (ZYRTEC) 10 MG tablet, Take 10 mg by mouth daily.  .  fluticasone (FLONASE) 50 MCG/ACT nasal spray, Place 1 spray into both nostrils daily. Marland Kitchen  guaiFENesin (MUCINEX) 600 MG 12 hr tablet, Take 600 mg by mouth daily.  .  montelukast (SINGULAIR) 10 MG tablet, Take 10 mg by mouth at bedtime.   Current Outpatient Medications (Analgesics):  .  ibuprofen (ADVIL,MOTRIN) 200 MG tablet, Take 600 mg by mouth every 4 (four) hours as needed for fever, headache, mild pain, moderate pain or cramping. Marland Kitchen  oxyCODONE-acetaminophen (ROXICET) 5-325 MG per tablet, Take 1-2 tablets by mouth every 4 (four) hours as needed for severe pain. (Patient taking differently: Take 0.25-1 tablets by mouth every 4 (four) hours as needed for severe pain. ) .  traMADol (ULTRAM) 50 MG tablet, Take 1 tablet (50 mg total) by mouth every 6 (six) hours as needed.     Current Outpatient Medications (Other):  Marland Kitchen  Alpha Lipoic Acid 200 MG CAPS, Take 600 mg by mouth 2 (two) times daily.  .  Ascorbic Acid (VITAMIN C) 1000 MG tablet, Take 2,000-3,000 mg by mouth 2 (two) times daily. Takes 2000mg  in the morning and 3000mg  at night .  calcium carbonate (OS-CAL) 600 MG TABS tablet, Take 800 mg by mouth 2 (two) times daily with a meal. .  clonazePAM (KLONOPIN) 1 MG tablet, Take 1 mg by mouth at bedtime.  .  Coenzyme Q10 200 MG capsule, Take 200 mg by mouth daily. .  cyclobenzaprine (FLEXERIL) 10 MG tablet, Take 1 tablet (10 mg total) by mouth 3 (three) times daily as needed for muscle spasms. .   Eszopiclone (ESZOPICLONE) 3 MG TABS, Take 1.5 mg by mouth at bedtime. Take immediately before bedtime .  Flaxseed, Linseed, (FLAXSEED OIL) 1000 MG CAPS, Take 1,000 mg by mouth daily.  Marland Kitchen  gabapentin (NEURONTIN) 100 MG capsule, TAKE 2 CAPSULES BY MOUTH EVERY DAY AT BEDTIME .  MAGNESIUM CITRATE PO*, Take 2 tablets by mouth daily. Marland Kitchen  MAGNESIUM LACTATE PO, Take 2 tablets by mouth  at bedtime. .  niacin 500 MG tablet, Take 500 mg by mouth daily.  .  nortriptyline (PAMELOR) 10 MG capsule, Take 2 capsules (20 mg total) at bedtime by mouth. .  Nutritional Supplements (SYTRINOL PO), Take 1 tablet by mouth daily.  Marland Kitchen  OVER THE COUNTER MEDICATION, Take 1-2 tablets by mouth 2 (two) times daily. *Nutrient 950 with NAC2* takes 2 tablets in the morning and 1 tablet at lunchtime .  OVER THE COUNTER MEDICATION, Take 2 tablets by mouth daily with breakfast. *LV GB Complex* .  OVER THE COUNTER MEDICATION, Take 1 tablet by mouth 2 (two) times daily. *Regenemax or Biosil* .  OVER THE COUNTER MEDICATION, Take 30 mg by mouth daily. *Borotab* .  OVER THE COUNTER MEDICATION, Take 1 capsule by mouth daily. *Lypozyme* .  potassium chloride (MICRO-K) 10 MEQ CR capsule, Take 2 tabs by mouth twice daily. .  STRONTIUM GLUCONATE-B6-B12-FA PO, Take 2 tablets by mouth daily.  .  Vitamin D, Ergocalciferol, (DRISDOL) 50000 units CAPS capsule, Take 50,000 Units by mouth every Sunday. Marland Kitchen  VITAMIN K, PHYTONADIONE, PO, Take 2 tablets by mouth daily.  Marland Kitchen  zinc gluconate 50 MG tablet, Take 50-100 mg by mouth 2 (two) times daily. Takes 100mg  in the morning and 50mg  later in the day  * These medications belong to multiple therapeutic classes and are listed under each applicable group.    Past medical history, social, surgical and family history all reviewed in electronic medical record.  No pertanent information unless stated regarding to the chief complaint.   Review of Systems:  No headache, visual changes, nausea, vomiting, diarrhea,  constipation, dizziness, abdominal pain, skin rash, fevers, chills, night sweats, weight loss, swollen lymph nodes, body aches, joint swelling,  chest pain, shortness of breath, mood changes.  Positive muscle aches  Objective  Blood pressure (!) 150/70, pulse 96, height 5\' 3"  (1.6 m), weight 105 lb (47.6 kg), SpO2 99 %.    General: No apparent distress alert and oriented x3 mood and affect normal, dressed appropriately.  Patient is cachectic HEENT: Pupils equal, extraocular movements intact  Respiratory: Patient's speak in full sentences and does not appear short of breath  Cardiovascular: No lower extremity edema, non tender, no erythema  Skin: Warm dry intact with no signs of infection or rash on extremities or on axial skeleton.  Abdomen: Soft nontender  Neuro: Cranial nerves II through XII are intact, neurovascularly intact in all extremities with 2+ DTRs and 2+ pulses.  Lymph: No lymphadenopathy of posterior or anterior cervical chain or axillae bilaterally.  Gait antalgic MSK:  tender with full range of motion and good stability and symmetric strength and tone of shoulders, elbows, wrist, knee and ankles bilaterally.  Left hip has decreased range of motion internally.  Mild discomfort more in the groin area.  Positive grind test.  Negative straight leg test.  Patient does have tenderness of the lumbar spine and tightness.   Procedure: Real-time Ultrasound Guided Injection of left intra-articular hip Device: GE Logiq Q7  Ultrasound guided injection is preferred based studies that show increased duration, increased effect, greater accuracy, decreased procedural pain, increased response rate with ultrasound guided versus blind injection.  Verbal informed consent obtained.  Time-out conducted.  Noted no overlying erythema, induration, or other signs of local infection.  Skin prepped in a sterile fashion.  Local anesthesia: Topical Ethyl chloride.  With sterile technique and under real  time ultrasound guidance:  Anterior capsule visualized, needle visualized going to the head neck  junction at the anterior capsule. Pictures taken. Patient did have injection of 3 cc of 0.5% Marcaine, and 1 cc of Kenalog 40 mg/dL. Completed without difficulty  Pain immediately resolved suggesting accurate placement of the medication.  Advised to call if fevers/chills, erythema, induration, drainage, or persistent bleeding.  Images permanently stored and available for review in the ultrasound unit.  Impression: Technically successful ultrasound guided injection.   Impression and Recommendations:     This case required medical decision making of moderate complexity. The above documentation has been reviewed and is accurate and complete Lyndal Pulley, DO       Note: This dictation was prepared with Dragon dictation along with smaller phrase technology. Any transcriptional errors that result from this process are unintentional.

## 2018-03-20 ENCOUNTER — Ambulatory Visit: Payer: Self-pay

## 2018-03-20 ENCOUNTER — Ambulatory Visit (INDEPENDENT_AMBULATORY_CARE_PROVIDER_SITE_OTHER): Payer: Medicare Other | Admitting: Family Medicine

## 2018-03-20 ENCOUNTER — Encounter: Payer: Self-pay | Admitting: Family Medicine

## 2018-03-20 VITALS — BP 150/70 | HR 96 | Ht 63.0 in | Wt 105.0 lb

## 2018-03-20 DIAGNOSIS — M1612 Unilateral primary osteoarthritis, left hip: Secondary | ICD-10-CM

## 2018-03-20 DIAGNOSIS — M25552 Pain in left hip: Secondary | ICD-10-CM

## 2018-03-20 NOTE — Patient Instructions (Signed)
Good to see you  Tried an injection in the hi to see how much this is playing  Arole.  I am hoping it helps for a long time  Send me a message on Monday and tell me how you are feeling  Have an appointment set up with me in 4-6 weeks in case

## 2018-03-20 NOTE — Assessment & Plan Note (Signed)
Patient given injection today.  Discussed icing regimen and home exercise.  Discussed which activities to do which was to avoid.  Follow-up which activities to do which was to avoid.  We discussed that if this did help this is hopeless diagnosed that if patient does go through with surgical intervention she understands that she will have improvement.  Patient after the injection was nearly pain-free.  We will discuss different treatment options with her orthopedic surgeon and follow-up with me as needed  Spent  25 minutes with patient face-to-face and had greater than 50% of counseling including as described above in assessment and plan.

## 2018-03-21 DIAGNOSIS — M1612 Unilateral primary osteoarthritis, left hip: Secondary | ICD-10-CM | POA: Diagnosis not present

## 2018-03-22 ENCOUNTER — Emergency Department (HOSPITAL_BASED_OUTPATIENT_CLINIC_OR_DEPARTMENT_OTHER): Payer: Medicare Other

## 2018-03-22 ENCOUNTER — Emergency Department (HOSPITAL_BASED_OUTPATIENT_CLINIC_OR_DEPARTMENT_OTHER)
Admission: EM | Admit: 2018-03-22 | Discharge: 2018-03-22 | Disposition: A | Discharge status: Home / Self Care | Payer: Medicare Other | Attending: Emergency Medicine | Admitting: Emergency Medicine

## 2018-03-22 ENCOUNTER — Other Ambulatory Visit: Payer: Self-pay

## 2018-03-22 ENCOUNTER — Encounter (HOSPITAL_BASED_OUTPATIENT_CLINIC_OR_DEPARTMENT_OTHER): Payer: Self-pay | Admitting: *Deleted

## 2018-03-22 DIAGNOSIS — Z7984 Long term (current) use of oral hypoglycemic drugs: Secondary | ICD-10-CM | POA: Insufficient documentation

## 2018-03-22 DIAGNOSIS — R7303 Prediabetes: Secondary | ICD-10-CM | POA: Diagnosis not present

## 2018-03-22 DIAGNOSIS — S60512A Abrasion of left hand, initial encounter: Secondary | ICD-10-CM | POA: Insufficient documentation

## 2018-03-22 DIAGNOSIS — Z853 Personal history of malignant neoplasm of breast: Secondary | ICD-10-CM | POA: Insufficient documentation

## 2018-03-22 DIAGNOSIS — M79642 Pain in left hand: Secondary | ICD-10-CM | POA: Diagnosis not present

## 2018-03-22 DIAGNOSIS — S6992XA Unspecified injury of left wrist, hand and finger(s), initial encounter: Secondary | ICD-10-CM | POA: Diagnosis present

## 2018-03-22 DIAGNOSIS — Z79899 Other long term (current) drug therapy: Secondary | ICD-10-CM | POA: Insufficient documentation

## 2018-03-22 DIAGNOSIS — L03114 Cellulitis of left upper limb: Secondary | ICD-10-CM | POA: Diagnosis not present

## 2018-03-22 DIAGNOSIS — Y999 Unspecified external cause status: Secondary | ICD-10-CM | POA: Diagnosis not present

## 2018-03-22 DIAGNOSIS — Y939 Activity, unspecified: Secondary | ICD-10-CM | POA: Diagnosis not present

## 2018-03-22 DIAGNOSIS — Y33XXXA Other specified events, undetermined intent, initial encounter: Secondary | ICD-10-CM | POA: Insufficient documentation

## 2018-03-22 DIAGNOSIS — Z87891 Personal history of nicotine dependence: Secondary | ICD-10-CM | POA: Insufficient documentation

## 2018-03-22 DIAGNOSIS — Y929 Unspecified place or not applicable: Secondary | ICD-10-CM | POA: Insufficient documentation

## 2018-03-22 DIAGNOSIS — E039 Hypothyroidism, unspecified: Secondary | ICD-10-CM | POA: Diagnosis not present

## 2018-03-22 MED ORDER — CLINDAMYCIN HCL 150 MG PO CAPS: 450.0000 mg | ORAL_CAPSULE | Freq: Three times a day (TID) | ORAL | 0 refills | Status: DC

## 2018-03-22 MED ORDER — ONDANSETRON 4 MG PO TBDP: 4.0000 mg | ORAL_TABLET | Freq: Three times a day (TID) | ORAL | 0 refills | Status: DC | PRN

## 2018-03-22 NOTE — ED Triage Notes (Signed)
Pt reports swelling to the top of her left hand x Monday, seen at urgent care, rx antibiotics, cont with pain and swelling.

## 2018-03-22 NOTE — Discharge Instructions (Addendum)
You have been diagnosed today with cellulitis of the left hand.  At this time there does not appear to be the presence of an emergent medical condition, however there is always the potential for conditions to change. Please read and follow the below instructions.  Please return to the Emergency Department immediately for any new or worsening symptoms or if your symptoms do not improve in the next 72 hours. Please be sure to follow up with your Primary Care Provider within 72 hours regarding your visit today; please call their office to schedule an appointment even if you are feeling better for a follow-up visit. Please take the new antibiotic clindamycin as prescribed for the next 7 days.  You may use the nausea medication Zofran as prescribed for any nausea that you experience with the antibiotic.  Please take a probiotic to help with possible abdominal symptoms relating to the antibiotic.  You may stop taking the old antibiotic Keflex at this time.  Contact a health care provider if: You have a fever. Your symptoms do not improve within 1-2 days of starting treatment. Your bone or joint underneath the infected area becomes painful after the skin has healed. Your infection returns in the same area or another area. You notice a swollen bump in the infected area. You develop new symptoms. You have a general ill feeling (malaise) with muscle aches and pains. Get help right away if: Your symptoms get worse. You feel very sleepy. You develop vomiting or diarrhea that persists. You notice red streaks coming from the infected area. Your red area gets larger or turns dark in color.  Please read the additional information packets attached to your discharge summary.  Do not take your medicine if  develop an itchy rash, swelling in your mouth or lips, or difficulty breathing.

## 2018-03-22 NOTE — ED Provider Notes (Signed)
[facility] EMERGENCY DEPARTMENT Provider Note   CSN: [id number] Arrival date & time: 03/22/18  9702     History   Chief Complaint Chief Complaint  Patient presents with  . Hand Pain    HPI Christina [name] is a 67 y.o. female with history of prediabetes, adrenal insufficiency, hypothyroidism and breast cancer presenting today for concern of left hand cellulitis.  Patient states that she was seen at urgent care 5 days ago prescribed Keflex for cellulitis of the left dorsal hand and given return precautions.  Patient endorses initial improvement of symptoms following Keflex after the first day however symptoms have plateaued and began progressing yesterday.  Patient states that she has a mild intensity constant burning pain to the dorsal aspect of her left hand with associated erythema and mild swelling.  She states that her pain is worsened with full wrist extension and flexion however pain is not increased with normal movements of the hand/wrist.  Patient denies history of immunocompromising diseases, states that she is not receiving chemotherapy, no treatment for breast cancer since 2016.  She denies history of fevers, nausea/vomiting, chills/rigors, numbness/weakness or tingling of her extremities.  Patient states that aside from her left hand pain she is feeling well today.  HPI  Past Medical History:  Diagnosis Date  . Abrasion of right leg 12/17/2014  . Adrenal insufficiency ([place])   . Breast cancer of upper-outer quadrant of left female breast ([place]) 12/07/2014  . Cataract, immature    bilateral  . Dental bridge present    upper - x 2  . Dental crowns present   . Hypothyroidism   . Personal history of radiation therapy   . Prediabetes     Patient Active Problem List   Diagnosis Date Noted  . Arthritis of left hip 03/20/2018  . Left hip pain 02/26/2018  . Greater trochanteric bursitis of right hip 06/13/2017  . 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 Breast cancer of upper-outer quadrant of left female breast ([place]) 12/07/2014  . Acute recurrent sinusitis 01/17/2013    Past Surgical History:  Procedure Laterality Date  . ABDOMINOPLASTY    . BALLOON SINUPLASTY    . BREAST LUMPECTOMY Left 2016  . BREAST LUMPECTOMY WITH RADIOACTIVE SEED AND SENTINEL LYMPH NODE BIOPSY Left 12/22/2014   Procedure: BREAST LUMPECTOMY WITH RADIOACTIVE SEED AND SENTINEL LYMPH NODE BIOPSY;  Surgeon: Stark Klein, MD;  Location: [facility];  Service: General;  Laterality: Left;  . BUNIONECTOMY Right   . HAMMER TOE SURGERY Bilateral   . RHINOPLASTY    . SEPTOPLASTY       OB History   None      Home Medications    Prior to Admission medications   Medication Sig Start Date End Date Taking? Authorizing Provider  albuterol (PROVENTIL HFA;VENTOLIN HFA) 108 (90 BASE) MCG/ACT inhaler Inhale into the lungs every 6 (six) hours as needed for wheezing or shortness of breath.    [provider]  Alpha Lipoic Acid 200 MG CAPS Take 600 mg by mouth 2 (two) times daily.     [provider]  Ascorbic Acid (VITAMIN C) 1000 MG tablet Take 2,000-3,000 mg by mouth 2 (two) times daily. Takes 2000mg  in the morning and 3000mg  at night    [provider]  calcium carbonate (OS-CAL) 600 MG TABS tablet Take 800 mg by mouth 2 (two) times daily with a meal.    [provider]  cetirizine (ZYRTEC)  10 MG tablet Take 10 mg by mouth daily.     [provider]  clindamycin (CLEOCIN) 150 MG capsule Take 3 capsules (450 mg total) by mouth 3 (three) times daily for 7 days. 03/22/18 03/29/18  Nuala Alpha A, PA-C  clonazePAM (KLONOPIN) 1 MG tablet Take 1 mg by mouth at bedtime.  11/20/12   [provider]  Coenzyme Q10 200 MG capsule Take 200 mg by mouth daily.    [provider]  cyclobenzaprine (FLEXERIL) 10 MG tablet Take 1 tablet (10 mg total) by mouth 3 (three) times daily as needed for muscle  spasms. 04/03/17   Lyndal Pulley, DO  Eszopiclone (ESZOPICLONE) 3 MG TABS Take 1.5 mg by mouth at bedtime. Take immediately before bedtime    [provider]  Flaxseed, Linseed, (FLAXSEED OIL) 1000 MG CAPS Take 1,000 mg by mouth daily.     [provider]  fluticasone (FLONASE) 50 MCG/ACT nasal spray Place 1 spray into both nostrils daily. 02/23/15   [provider]  gabapentin (NEURONTIN) 100 MG capsule TAKE 2 CAPSULES BY MOUTH EVERY DAY AT BEDTIME 02/12/17   Lyndal Pulley, DO  guaiFENesin (MUCINEX) 600 MG 12 hr tablet Take 600 mg by mouth daily.     [provider]  ibuprofen (ADVIL,MOTRIN) 200 MG tablet Take 600 mg by mouth every 4 (four) hours as needed for fever, headache, mild pain, moderate pain or cramping.    [provider]  liothyronine (CYTOMEL) 25 MCG tablet Take 50 mcg by mouth daily. 02/03/15   [provider]  MAGNESIUM CITRATE PO Take 2 tablets by mouth daily.    [provider]  MAGNESIUM LACTATE PO Take 2 tablets by mouth at bedtime.    [provider]  metFORMIN (GLUCOPHAGE-XR) 500 MG 24 hr tablet Take 500-1,000 mg by mouth 3 (three) times daily. Takes 1000mg  in the morning, 500mg  at lunch, and 500mg  with supper 02/08/15   [provider]  methylPREDNISolone (MEDROL) 4 MG tablet Take 2.5-4 mg by mouth 3 (three) times daily. Takes 4mg  in the morning and 2.5mg  midday and at bedtime    [provider]  montelukast (SINGULAIR) 10 MG tablet Take 10 mg by mouth at bedtime.    [provider]  niacin 500 MG tablet Take 500 mg by mouth daily.     [provider]  nortriptyline (PAMELOR) 10 MG capsule Take 2 capsules (20 mg total) at bedtime by mouth. 03/12/17   Lyndal Pulley, DO  Nutritional Supplements (SYTRINOL PO) Take 1 tablet by mouth daily.     [provider]  ondansetron (ZOFRAN ODT) 4 MG disintegrating tablet Take 1 tablet (4 mg total) by mouth every 8  (eight) hours as needed for nausea or vomiting. 03/22/18   Nuala Alpha A, PA-C  OVER THE COUNTER MEDICATION Take 1-2 tablets by mouth 2 (two) times daily. *Nutrient 950 with NAC2* takes 2 tablets in the morning and 1 tablet at lunchtime    [provider]  OVER THE COUNTER MEDICATION Take 2 tablets by mouth daily with breakfast. *LV GB Complex*    [provider]  OVER THE COUNTER MEDICATION Take 1 tablet by mouth 2 (two) times daily. *Regenemax or Biosil*    [provider]  OVER THE COUNTER MEDICATION Take 30 mg by mouth daily. *Borotab*    [provider]  OVER THE COUNTER MEDICATION Take 1 capsule by mouth daily. *Lypozyme*    [provider]  oxyCODONE-acetaminophen (  ROXICET) 5-325 MG per tablet Take 1-2 tablets by mouth every 4 (four) hours as needed for severe pain. Patient taking differently: Take 0.25-1 tablets by mouth every 4 (four) hours as needed for severe pain.  12/22/14   Stark Klein, MD  potassium chloride (MICRO-K) 10 MEQ CR capsule Take 2 tabs by mouth twice daily. 01/30/13   [provider]  STRONTIUM GLUCONATE-B6-B12-FA PO Take 2 tablets by mouth daily.     [provider]  traMADol (ULTRAM) 50 MG tablet Take 1 tablet (50 mg total) by mouth every 6 (six) hours as needed. 03/11/18   Lyndal Pulley, DO  Vitamin D, Ergocalciferol, (DRISDOL) 50000 units CAPS capsule Take 50,000 Units by mouth every Sunday.    [provider]  VITAMIN K, PHYTONADIONE, PO Take 2 tablets by mouth daily.     [provider]  zinc gluconate 50 MG tablet Take 50-100 mg by mouth 2 (two) times daily. Takes 100mg  in the morning and 50mg  later in the day    [provider]    Family History Family History  Problem Relation Age of Onset  . Diabetes Mother   . Diabetes Father   . Cancer Maternal Uncle        pancreatic cancer   . Diabetes Paternal Grandmother   . Diabetes Paternal Grandfather     Social  History Social History   Tobacco Use  . Smoking status: Former Smoker    Packs/day: 0.00    Years: 0.00    Pack years: 0.00    Last attempt to quit: 05/01/1980    Years since quitting: 37.9  . Smokeless tobacco: Never Used  Substance Use Topics  . Alcohol use: Yes    Alcohol/week: 0.0 standard drinks    Comment: occasionally  . Drug use: No     Allergies   Celery oil; Doxycycline; Rye grass flower pollen extract [gramineae pollens]; Sulfa antibiotics; Tea; and Adhesive [tape]   Review of Systems Review of Systems  Constitutional: Negative.  Negative for chills and fever.  HENT: Negative.  Negative for rhinorrhea and sore throat.   Eyes: Negative.  Negative for visual disturbance.  Respiratory: Negative.  Negative for cough and shortness of breath.   Cardiovascular: Negative.  Negative for chest pain.  Gastrointestinal: Negative.  Negative for abdominal pain, diarrhea, nausea and vomiting.  Genitourinary: Negative.  Negative for dysuria and hematuria.  Musculoskeletal: Positive for arthralgias. Negative for myalgias.  Skin: Positive for color change (Erythema) and wound (Small abrasions to dorsum of left hand).  Neurological: Negative.  Negative for dizziness, weakness, numbness and headaches.    Physical Exam Updated Vital Signs BP (!) 149/80 (BP Location: Right Arm)   Pulse (!) 102   Resp 18   Ht 5\' 3"  (1.6 m)   Wt 47.6 kg   SpO2 99%   BMI 18.60 kg/m   Physical Exam  Constitutional: She appears well-developed and well-nourished. No distress.  HENT:  Head: Normocephalic and atraumatic.  Right Ear: External ear normal.  Left Ear: External ear normal.  Nose: Nose normal.  Eyes: Pupils are equal, round, and reactive to light. EOM are normal.  Neck: Trachea normal and normal range of motion. No tracheal deviation present.  Pulmonary/Chest: Effort normal. No respiratory distress.  Abdominal: Soft. There is no tenderness. There is no rebound and no guarding.    Musculoskeletal: Normal range of motion.  Left hand:   Diffuse erythema and swelling to dorsum of left hand, small skin breaks present  throughout.  Tenderness diffusely over dorsum of left hand and particularly on radial aspect of left wrist.  Mild increased warmth to dorsum of left hand as well.  No increased pain with movement of the hand/wrist. Fingers appear normal.  No snuffbox tenderness to palpation. No tenderness to palpation over flexor sheath.  Finger adduction/abduction intact with 5/5 strength.  Thumb opposition intact. Full active and resisted ROM to flexion/extension at wrist, MCP, PIP and DIP of all fingers.  FDS/FDP intact. Grip 5/5 strength.  Radial artery 2+ with <2sec cap refill in all fingers.  Sensation intact to light-tough in median/ulnar/radial distributions.  Left elbow appears normal.  Please see picture below.  Neurological: She is alert. GCS eye subscore is 4. GCS verbal subscore is 5. GCS motor subscore is 6.  Speech is clear and goal oriented, follows commands Major Cranial nerves without deficit, no facial droop Normal strength in upper bilaterally including strong and equal grip strength Sensation normal to light touch Moves extremities without ataxia, coordination intact Normal gait  Skin: Skin is warm and dry. Capillary refill takes less than 2 seconds.  Psychiatric: She has a normal mood and affect. Her behavior is normal.       ED Treatments / Results  Labs (all labs ordered are listed, but only abnormal results are displayed) Labs Reviewed - No data to display  EKG None  Radiology Dg Hand Complete Left  Result Date: 03/22/2018 CLINICAL DATA:  Left hand pain.  Swelling.  Redness. EXAM: LEFT HAND - COMPLETE 3+ VIEW COMPARISON:  No recent. FINDINGS: Diffuse osteopenia and degenerative change. No acute bony abnormality identified. No evidence of fracture. No evidence of dislocation. Mild subluxation of the third metacarpal phalangeal joint  noted. IMPRESSION: 1. Mild subluxation of the third metacarpal phalangeal joint cannot be excluded. No acute abnormality otherwise noted. No evidence of fracture or dislocation. 2.  Diffuse osteopenia degenerative change. Electronically Signed   By: Marcello Moores  Register   On: 03/22/2018 10:43    Procedures Procedures (including critical care time)  Medications Ordered in ED Medications - No data to display   Initial Impression / Assessment and Plan / ED Course  I have reviewed the triage vital signs and the nursing notes.  Pertinent labs & imaging results that were available during my care of the patient were reviewed by me and considered in my medical decision making (see chart for details).  Clinical Course as of Mar 23 1107  Fri Mar 22, 2018  1103 Patient seen and evaluated by Dr. Laverta Baltimore.  Patient may be treated outpatient at this time with oral clindamycin and follow-up precautions. Zofran prescription due to patient concern of nausea with antibiotics.   [BM]    Clinical Course User Index [BM] Deliah Boston, PA-C   67 year old female without history of immunocompromising diseases presenting today for cellulitis of the dorsal left hand.  Patient has been on 5 days of Keflex with some improvement however symptoms have plateaued over the past few days.  Imaging today without acute findings.  Patient is to be started on clindamycin for increased coverage and given strict return precautions.  Patient overall very well-appearing and in no acute distress, do not suspect septic arthritis at this time, patient without joint pain with movement of the wrist/hand on examination today.  Patient neurovascular intact to bilateral upper extremities.  Due to patient's concern of nausea with antibiotics she has been prescribed Zofran today.  Patient afebrile, well-appearing does not meet sepsis/sirs criteria.  At this  time there does not appear to be any evidence of an acute emergency medical condition  and the patient appears stable for discharge with appropriate outpatient follow up. Diagnosis was discussed with patient who verbalizes understanding of care plan and is agreeable to discharge. I have discussed return precautions with patient who verbalizes understanding of return precautions. Patient strongly encouraged to follow-up with their PCP within 72hrs. All questions answered.  Patient seen and evaluated by Dr. Laverta Baltimore who agrees with plan of care at this time; clindamycin, zofran and d/c with PCP follow-up.  Note: Portions of this report may have been transcribed using voice recognition software. Every effort was made to ensure accuracy; however, inadvertent computerized transcription errors may still be present.  Final Clinical Impressions(s) / ED Diagnoses   Final diagnoses:  Cellulitis of left hand    ED Discharge Orders         Ordered    clindamycin (CLEOCIN) 150 MG capsule  3 times daily     03/22/18 1059    ondansetron (ZOFRAN ODT) 4 MG disintegrating tablet  Every 8 hours PRN     03/22/18 1059           Gari Crown 03/22/18 1128    [place] Olds, MD 03/23/18 0840

## 2018-03-25 ENCOUNTER — Ambulatory Visit: Payer: Medicare Other | Admitting: Family Medicine

## 2018-03-27 ENCOUNTER — Encounter: Payer: Self-pay | Admitting: Family Medicine

## 2018-03-27 ENCOUNTER — Ambulatory Visit (INDEPENDENT_AMBULATORY_CARE_PROVIDER_SITE_OTHER): Payer: Medicare Other | Admitting: Family Medicine

## 2018-03-27 DIAGNOSIS — M1612 Unilateral primary osteoarthritis, left hip: Secondary | ICD-10-CM

## 2018-03-27 MED ORDER — CLINDAMYCIN HCL 150 MG PO CAPS: 450.0000 mg | ORAL_CAPSULE | Freq: Three times a day (TID) | ORAL | 0 refills | Status: AC

## 2018-03-27 NOTE — Patient Instructions (Signed)
Good to see you  Refilled the clindamycin  Lets keep trucking along non the hip then  See me again in 2-3 months  Happy holidays!

## 2018-03-27 NOTE — Assessment & Plan Note (Signed)
Arthritis.  Patient did respond well to the injection.  Patient talked to the orthopedic surgeon who wanted her to try not to undergo the replacement.  Patient given her the prognosis and other things to potentially do.  We discussed icing regimen.  We discussed continuing to be active though otherwise.  Follow-up again in 4 to 8 weeks Spent  25 minutes with patient face-to-face and had greater than 50% of counseling including as described above in assessment and plan.

## 2018-03-27 NOTE — Progress Notes (Signed)
[name] [place], [place] [address] Phone: [phone] Subjective:    I Christina [name] am serving as a Education administrator for Dr. Hulan Saas.    CC: Back and hip pain follow-up  QQI:[id number]  Christina [name] is a 67 y.o. female coming in with complaint of left hip pain. States she couldn't find email address to ask questions. Patient was seen last time and did have hip arthritis.  Given an intra-articular hip injection that was beneficial.  Patient went to the orthopedic surgeon who stated that likely could do well with conservative therapy still and did not want to do any replacement     Past Medical History:  Diagnosis Date  . Abrasion of right leg 12/17/2014  . Adrenal insufficiency ([place])   . Breast cancer of upper-outer quadrant of left female breast ([facility]) 12/07/2014  . Cataract, immature    bilateral  . Dental bridge present    upper - x 2  . Dental crowns present   . Hypothyroidism   . Personal history of radiation therapy   . Prediabetes    Past Surgical History:  Procedure Laterality Date  . ABDOMINOPLASTY    . BALLOON SINUPLASTY    . BREAST LUMPECTOMY Left 2016  . BREAST LUMPECTOMY WITH RADIOACTIVE SEED AND SENTINEL LYMPH NODE BIOPSY Left 12/22/2014   Procedure: BREAST LUMPECTOMY WITH RADIOACTIVE SEED AND SENTINEL LYMPH NODE BIOPSY;  Surgeon: Stark Klein, MD;  Location: [facility];  Service: General;  Laterality: Left;  . BUNIONECTOMY Right   . HAMMER TOE SURGERY Bilateral   . RHINOPLASTY    . SEPTOPLASTY     Social History   Socioeconomic History  . Marital status: Widowed    Spouse name: Not on file  . Number of children: Not on file  . Years of education: Not on file  . Highest education level: Not on file  Occupational History    Employer: [employer] Needs  . Financial resource strain: Not on file  . Food insecurity:    Worry: Not on file    Inability: Not on file  . Transportation  needs:    Medical: Not on file    Non-medical: Not on file  Tobacco Use  . Smoking status: Former Smoker    Packs/day: 0.00    Years: 0.00    Pack years: 0.00    Last attempt to quit: 05/01/1980    Years since quitting: 37.9  . Smokeless tobacco: Never Used  Substance and Sexual Activity  . Alcohol use: Yes    Alcohol/week: 0.0 standard drinks    Comment: occasionally  . Drug use: No  . Sexual activity: Not on file  Lifestyle  . Physical activity:    Days per week: Not on file    Minutes per session: Not on file  . Stress: Not on file  Relationships  . Social connections:    Talks on phone: Not on file    Gets together: Not on file    Attends religious service: Not on file    Active member of club or organization: Not on file    Attends meetings of clubs or organizations: Not on file    Relationship status: Not on file  Other Topics Concern  . Not on file  Social History Narrative  . Not on file   Allergies  Allergen Reactions  . Celery Oil Other (See Comments)    FEVER BLISTERS  . Doxycycline Nausea  And Vomiting  . Rye Grass Flower Pollen Extract [Gramineae Pollens] Swelling    RYE:  SWELLING THROAT - DENIES SOB  . Sulfa Antibiotics Swelling    NOSE  . Tea Swelling    THROAT - DENIES SOB  . Adhesive [Tape] Other (See Comments)    TEARS SKIN   Family History  Problem Relation Age of Onset  . Diabetes Mother   . Diabetes Father   . Cancer Maternal Uncle        pancreatic cancer   . Diabetes Paternal Grandmother   . Diabetes Paternal Grandfather     Current Outpatient Medications (Endocrine & Metabolic):  .  liothyronine (CYTOMEL) 25 MCG tablet, Take 50 mcg by mouth daily. .  metFORMIN (GLUCOPHAGE-XR) 500 MG 24 hr tablet, Take 500-1,000 mg by mouth 3 (three) times daily. Takes 1000mg  in the morning, 500mg  at lunch, and 500mg  with supper .  methylPREDNISolone (MEDROL) 4 MG tablet, Take 2.5-4 mg by mouth 3 (three) times daily. Takes 4mg  in the morning and 2.5mg   midday and at bedtime  Current Facility-Administered Medications (Endocrine & Metabolic):  .  methylPREDNISolone acetate (DEPO-MEDROL) injection 80 mg    Current Outpatient Medications (Respiratory):  .  albuterol (PROVENTIL HFA;VENTOLIN HFA) 108 (90 BASE) MCG/ACT inhaler, Inhale into the lungs every 6 (six) hours as needed for wheezing or shortness of breath. .  cetirizine (ZYRTEC) 10 MG tablet, Take 10 mg by mouth daily.  .  fluticasone (FLONASE) 50 MCG/ACT nasal spray, Place 1 spray into both nostrils daily. Marland Kitchen  guaiFENesin (MUCINEX) 600 MG 12 hr tablet, Take 600 mg by mouth daily.  .  montelukast (SINGULAIR) 10 MG tablet, Take 10 mg by mouth at bedtime.   Current Outpatient Medications (Analgesics):  .  ibuprofen (ADVIL,MOTRIN) 200 MG tablet, Take 600 mg by mouth every 4 (four) hours as needed for fever, headache, mild pain, moderate pain or cramping. Marland Kitchen  oxyCODONE-acetaminophen (ROXICET) 5-325 MG per tablet, Take 1-2 tablets by mouth every 4 (four) hours as needed for severe pain. (Patient taking differently: Take 0.25-1 tablets by mouth every 4 (four) hours as needed for severe pain. ) .  traMADol (ULTRAM) 50 MG tablet, Take 1 tablet (50 mg total) by mouth every 6 (six) hours as needed.     Current Outpatient Medications (Other):  Marland Kitchen  Alpha Lipoic Acid 200 MG CAPS, Take 600 mg by mouth 2 (two) times daily.  .  Ascorbic Acid (VITAMIN C) 1000 MG tablet, Take 2,000-3,000 mg by mouth 2 (two) times daily. Takes 2000mg  in the morning and 3000mg  at night .  calcium carbonate (OS-CAL) 600 MG TABS tablet, Take 800 mg by mouth 2 (two) times daily with a meal. .  clindamycin (CLEOCIN) 150 MG capsule, Take 3 capsules (450 mg total) by mouth 3 (three) times daily for 7 days. .  clonazePAM (KLONOPIN) 1 MG tablet, Take 1 mg by mouth at bedtime.  .  Coenzyme Q10 200 MG capsule, Take 200 mg by mouth daily. .  cyclobenzaprine (FLEXERIL) 10 MG tablet, Take 1 tablet (10 mg total) by mouth 3 (three)  times daily as needed for muscle spasms. .  Eszopiclone (ESZOPICLONE) 3 MG TABS, Take 1.5 mg by mouth at bedtime. Take immediately before bedtime .  Flaxseed, Linseed, (FLAXSEED OIL) 1000 MG CAPS, Take 1,000 mg by mouth daily.  Marland Kitchen  gabapentin (NEURONTIN) 100 MG capsule, TAKE 2 CAPSULES BY MOUTH EVERY DAY AT BEDTIME .  MAGNESIUM CITRATE PO*, Take 2 tablets by mouth daily. Marland Kitchen  MAGNESIUM LACTATE PO, Take 2 tablets by mouth at bedtime. .  niacin 500 MG tablet, Take 500 mg by mouth daily.  .  nortriptyline (PAMELOR) 10 MG capsule, Take 2 capsules (20 mg total) at bedtime by mouth. .  Nutritional Supplements (SYTRINOL PO), Take 1 tablet by mouth daily.  .  ondansetron (ZOFRAN ODT) 4 MG disintegrating tablet, Take 1 tablet (4 mg total) by mouth every 8 (eight) hours as needed for nausea or vomiting. Marland Kitchen  OVER THE COUNTER MEDICATION, Take 1-2 tablets by mouth 2 (two) times daily. *Nutrient 950 with NAC2* takes 2 tablets in the morning and 1 tablet at lunchtime .  OVER THE COUNTER MEDICATION, Take 2 tablets by mouth daily with breakfast. *LV GB Complex* .  OVER THE COUNTER MEDICATION, Take 1 tablet by mouth 2 (two) times daily. *Regenemax or Biosil* .  OVER THE COUNTER MEDICATION, Take 30 mg by mouth daily. *Borotab* .  OVER THE COUNTER MEDICATION, Take 1 capsule by mouth daily. *Lypozyme* .  potassium chloride (MICRO-K) 10 MEQ CR capsule, Take 2 tabs by mouth twice daily. .  STRONTIUM GLUCONATE-B6-B12-FA PO, Take 2 tablets by mouth daily.  .  Vitamin D, Ergocalciferol, (DRISDOL) 50000 units CAPS capsule, Take 50,000 Units by mouth every Sunday. Marland Kitchen  VITAMIN K, PHYTONADIONE, PO, Take 2 tablets by mouth daily.  Marland Kitchen  zinc gluconate 50 MG tablet, Take 50-100 mg by mouth 2 (two) times daily. Takes 100mg  in the morning and 50mg  later in the day  * These medications belong to multiple therapeutic classes and are listed under each applicable group.    Past medical history, social, surgical and family history all  reviewed in electronic medical record.  No pertanent information unless stated regarding to the chief complaint.   Review of Systems:  No headache, visual changes, nausea, vomiting, diarrhea, constipation, dizziness, abdominal pain, skin rash, fevers, chills, night sweats, weight loss, swollen lymph nodes, body aches, joint swelling,  chest pain, shortness of breath, mood changes.  Positive muscle aches  Objective  Blood pressure (!) 150/80, pulse 100, height 5\' 3"  (1.6 m), weight 107 lb (48.5 kg), SpO2 97 %.    General: No apparent distress alert and oriented x3 mood and affect normal, dressed appropriately.  Patient is cachectic HEENT: Pupils equal, extraocular movements intact  Respiratory: Patient's speak in full sentences and does not appear short of breath  Cardiovascular: No lower extremity edema, non tender, no erythema  Skin: Warm dry intact with no signs of infection or rash on extremities or on axial skeleton.  Abdomen: Soft nontender  Neuro: Cranial nerves II through XII are intact, neurovascularly intact in all extremities with 2+ DTRs and 2+ pulses.  Lymph: No lymphadenopathy of posterior or anterior cervical chain or axillae bilaterally.  Gait antalgic MSK:  Non tender with full range of motion and good stability and symmetric strength and tone of shoulders, elbows, wrist, , knee and ankles bilaterally.  Left hip exam does have significant decrease in range of motion.  Patient has no internal range of motion and does have severe amount of pain.  Mild straight leg test still noted with L3 distribution.  Neurovascular intact distally.  4 out of 5 strength of the lower extreme is bilaterally.    Impression and Recommendations:      The above documentation has been reviewed and is accurate and complete Lyndal Pulley, DO       Note: This dictation was prepared with Dragon dictation along with smaller phrase technology.  Any transcriptional errors that result from this process  are unintentional.

## 2018-03-31 ENCOUNTER — Encounter (HOSPITAL_BASED_OUTPATIENT_CLINIC_OR_DEPARTMENT_OTHER): Payer: Self-pay | Admitting: *Deleted

## 2018-03-31 ENCOUNTER — Emergency Department (HOSPITAL_BASED_OUTPATIENT_CLINIC_OR_DEPARTMENT_OTHER)
Admission: EM | Admit: 2018-03-31 | Discharge: 2018-03-31 | Disposition: A | Discharge status: Home / Self Care | Payer: Medicare Other | Attending: Emergency Medicine | Admitting: Emergency Medicine

## 2018-03-31 ENCOUNTER — Other Ambulatory Visit: Payer: Self-pay

## 2018-03-31 DIAGNOSIS — R1111 Vomiting without nausea: Secondary | ICD-10-CM | POA: Diagnosis not present

## 2018-03-31 DIAGNOSIS — E039 Hypothyroidism, unspecified: Secondary | ICD-10-CM | POA: Diagnosis not present

## 2018-03-31 DIAGNOSIS — Z7984 Long term (current) use of oral hypoglycemic drugs: Secondary | ICD-10-CM | POA: Diagnosis not present

## 2018-03-31 DIAGNOSIS — Z79899 Other long term (current) drug therapy: Secondary | ICD-10-CM | POA: Diagnosis not present

## 2018-03-31 DIAGNOSIS — Z87891 Personal history of nicotine dependence: Secondary | ICD-10-CM | POA: Diagnosis not present

## 2018-03-31 DIAGNOSIS — R1110 Vomiting, unspecified: Secondary | ICD-10-CM | POA: Diagnosis not present

## 2018-03-31 NOTE — ED Triage Notes (Signed)
Swelling and pain to left hand x 1 week. Being treated with clindamycin.  States that she vomited after taking her antibiotic yesterday so she has not taken it again.  Reports welling is better in her hand but is not back to normal.

## 2018-03-31 NOTE — ED Provider Notes (Signed)
[facility] EMERGENCY DEPARTMENT Provider Note   CSN: [id number] Arrival date & time: 03/31/18  6213     History   Chief Complaint Chief Complaint  Patient presents with  . Hand Pain    HPI Christina [name] is a 67 y.o. female.  HPI Patient had an episode of vomiting.  States came on acutely and it was after she took clindamycin.  She has had a week of antibiotics of clindamycin for her left hand infection.  Recently had a new prescription filled.  Had the first dose of that and vomited.  She has had a little bit of a cough.  States she had some dull chest pain right when the vomiting happened.  Feels better now.  Took a Zofran and nausea improved.  States the hand is getting better but still has some pain. Past Medical History:  Diagnosis Date  . Abrasion of right leg 12/17/2014  . Adrenal insufficiency ([facility])   . Breast cancer of upper-outer quadrant of left female breast ([facility]) 12/07/2014  . Cataract, immature    bilateral  . Dental bridge present    upper - x 2  . Dental crowns present   . Hypothyroidism   . Personal history of radiation therapy   . Prediabetes     Patient Active Problem List   Diagnosis Date Noted  . Arthritis of left hip 03/20/2018  . Left hip pain 02/26/2018  . Greater trochanteric bursitis of right hip 06/13/2017  . 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 Breast cancer of upper-outer quadrant of left female breast ([facility]) 12/07/2014  . Acute recurrent sinusitis 01/17/2013    Past Surgical History:  Procedure Laterality Date  . ABDOMINOPLASTY    . BALLOON SINUPLASTY    . BREAST LUMPECTOMY Left 2016  . BREAST LUMPECTOMY WITH RADIOACTIVE SEED AND SENTINEL LYMPH NODE BIOPSY Left 12/22/2014   Procedure: BREAST LUMPECTOMY WITH RADIOACTIVE SEED AND SENTINEL LYMPH NODE BIOPSY;  Surgeon: Stark Klein, MD;  Location: [facility];  Service: General;  Laterality: Left;  . BUNIONECTOMY Right   . HAMMER TOE SURGERY  Bilateral   . RHINOPLASTY    . SEPTOPLASTY       OB History   None      Home Medications    Prior to Admission medications   Medication Sig Start Date End Date Taking? Authorizing Provider  albuterol (PROVENTIL HFA;VENTOLIN HFA) 108 (90 BASE) MCG/ACT inhaler Inhale into the lungs every 6 (six) hours as needed for wheezing or shortness of breath.    [provider]  Alpha Lipoic Acid 200 MG CAPS Take 600 mg by mouth 2 (two) times daily.     [provider]  Ascorbic Acid (VITAMIN C) 1000 MG tablet Take 2,000-3,000 mg by mouth 2 (two) times daily. Takes 2000mg  in the morning and 3000mg  at night    [provider]  calcium carbonate (OS-CAL) 600 MG TABS tablet Take 800 mg by mouth 2 (two) times daily with a meal.    [provider]  cetirizine (ZYRTEC) 10 MG tablet Take 10 mg by mouth daily.     [provider]  clindamycin (CLEOCIN) 150 MG capsule Take 3 capsules (450 mg total) by mouth 3 (three) times daily for 7 days. 03/27/18 04/03/18  Lyndal Pulley, DO  clonazePAM (KLONOPIN) 1 MG tablet Take 1 mg by mouth at bedtime.  11/20/12   [provider]  Coenzyme Q10 200 MG capsule Take 200  mg by mouth daily.    [provider]  cyclobenzaprine (FLEXERIL) 10 MG tablet Take 1 tablet (10 mg total) by mouth 3 (three) times daily as needed for muscle spasms. 04/03/17   Lyndal Pulley, DO  Eszopiclone (ESZOPICLONE) 3 MG TABS Take 1.5 mg by mouth at bedtime. Take immediately before bedtime    [provider]  Flaxseed, Linseed, (FLAXSEED OIL) 1000 MG CAPS Take 1,000 mg by mouth daily.     [provider]  fluticasone (FLONASE) 50 MCG/ACT nasal spray Place 1 spray into both nostrils daily. 02/23/15   [provider]  gabapentin (NEURONTIN) 100 MG capsule TAKE 2 CAPSULES BY MOUTH EVERY DAY AT BEDTIME 02/12/17   Lyndal Pulley, DO  guaiFENesin (MUCINEX) 600 MG 12 hr tablet Take 600 mg by mouth daily.      [provider]  ibuprofen (ADVIL,MOTRIN) 200 MG tablet Take 600 mg by mouth every 4 (four) hours as needed for fever, headache, mild pain, moderate pain or cramping.    [provider]  liothyronine (CYTOMEL) 25 MCG tablet Take 50 mcg by mouth daily. 02/03/15   [provider]  MAGNESIUM CITRATE PO Take 2 tablets by mouth daily.    [provider]  MAGNESIUM LACTATE PO Take 2 tablets by mouth at bedtime.    [provider]  metFORMIN (GLUCOPHAGE-XR) 500 MG 24 hr tablet Take 500-1,000 mg by mouth 3 (three) times daily. Takes 1000mg  in the morning, 500mg  at lunch, and 500mg  with supper 02/08/15   [provider]  methylPREDNISolone (MEDROL) 4 MG tablet Take 2.5-4 mg by mouth 3 (three) times daily. Takes 4mg  in the morning and 2.5mg  midday and at bedtime    [provider]  montelukast (SINGULAIR) 10 MG tablet Take 10 mg by mouth at bedtime.    [provider]  niacin 500 MG tablet Take 500 mg by mouth daily.     [provider]  nortriptyline (PAMELOR) 10 MG capsule Take 2 capsules (20 mg total) at bedtime by mouth. 03/12/17   Lyndal Pulley, DO  Nutritional Supplements (SYTRINOL PO) Take 1 tablet by mouth daily.     [provider]  ondansetron (ZOFRAN ODT) 4 MG disintegrating tablet Take 1 tablet (4 mg total) by mouth every 8 (eight) hours as needed for nausea or vomiting. 03/22/18   Nuala Alpha A, PA-C  OVER THE COUNTER MEDICATION Take 1-2 tablets by mouth 2 (two) times daily. *Nutrient 950 with NAC2* takes 2 tablets in the morning and 1 tablet at lunchtime    [provider]  OVER THE COUNTER MEDICATION Take 2 tablets by mouth daily with breakfast. *LV GB Complex*    [provider]  OVER THE COUNTER MEDICATION Take 1 tablet by mouth 2 (two) times daily. *Regenemax or Biosil*    [provider]  OVER THE COUNTER MEDICATION Take 30 mg by mouth daily. *Borotab*    [provider]  OVER THE COUNTER MEDICATION Take 1 capsule by mouth daily. *Lypozyme*    [provider]  oxyCODONE-acetaminophen (ROXICET) 5-325 MG per tablet Take 1-2 tablets by mouth every 4 (four) hours as needed for severe pain. Patient taking differently: Take 0.25-1 tablets by mouth every 4 (four) hours as needed for severe pain.  12/22/14   Stark Klein, MD  potassium chloride (MICRO-K) 10 MEQ CR capsule Take 2 tabs by mouth twice daily. 01/30/13   [provider]  STRONTIUM GLUCONATE-B6-B12-FA PO Take 2 tablets by  mouth daily.     [provider]  traMADol (ULTRAM) 50 MG tablet Take 1 tablet (50 mg total) by mouth every 6 (six) hours as needed. 03/11/18   Lyndal Pulley, DO  Vitamin D, Ergocalciferol, (DRISDOL) 50000 units CAPS capsule Take 50,000 Units by mouth every Sunday.    [provider]  VITAMIN K, PHYTONADIONE, PO Take 2 tablets by mouth daily.     [provider]  zinc gluconate 50 MG tablet Take 50-100 mg by mouth 2 (two) times daily. Takes 100mg  in the morning and 50mg  later in the day    [provider]    Family History Family History  Problem Relation Age of Onset  . Diabetes Mother   . Diabetes Father   . Cancer Maternal Uncle        pancreatic cancer   . Diabetes Paternal Grandmother   . Diabetes Paternal Grandfather     Social History Social History   Tobacco Use  . Smoking status: Former Smoker    Packs/day: 0.00    Years: 0.00    Pack years: 0.00    Last attempt to quit: 05/01/1980    Years since quitting: 37.9  . Smokeless tobacco: Never Used  Substance Use Topics  . Alcohol use: Yes    Alcohol/week: 0.0 standard drinks    Comment: occasionally  . Drug use: No     Allergies   Celery oil; Doxycycline; Rye grass flower pollen extract [gramineae pollens]; Sulfa antibiotics; Tea; and Adhesive [tape]   Review of Systems Review of Systems  Constitutional: Negative for appetite change.  HENT:  Negative for congestion.   Respiratory: Positive for cough.   Gastrointestinal: Positive for vomiting. Negative for anal bleeding.  Genitourinary: Negative for flank pain.  Musculoskeletal: Negative for gait problem and joint swelling.       Left hand pain and swelling.  Skin: Negative for rash.  Neurological: Negative for weakness.  Psychiatric/Behavioral: Negative for confusion.     Physical Exam Updated Vital Signs BP (!) 150/83 (BP Location: Right Arm)   Pulse 95   Temp 98.1 F (36.7 C) (Oral)   Resp 18   Ht 5\' 3"  (1.6 m)   Wt 48.5 kg   SpO2 100%   BMI 18.94 kg/m   Physical Exam  Constitutional: She appears well-developed.  Neck: Neck supple.  Cardiovascular: Normal rate.  Pulmonary/Chest: She has no rales.  Abdominal: Soft. There is no tenderness.  Musculoskeletal: She exhibits no edema.   some mild erythema with swelling in dorsum of left hand.  Good range of motion with flexion extension and fingers and wrist.  No fluctuance.  Neurological: She is alert.  Skin: Skin is warm. Capillary refill takes less than 2 seconds.     ED Treatments / Results  Labs (all labs ordered are listed, but only abnormal results are displayed) Labs Reviewed - No data to display  EKG None  Radiology No results found.  Procedures Procedures (including critical care time)  Medications Ordered in ED Medications - No data to display   Initial Impression / Assessment and Plan / ED Course  I have reviewed the triage vital signs and the nursing notes.  Pertinent labs & imaging results that were available during my care of the patient were reviewed by me and considered in my medical decision making (see chart for details).     Patient with single episode of vomiting after taking clindamycin.  Hand is improving her problems could still use  more antibiotics.  We will continue the clindamycin.  Has antiemetics at home.  Discharge home.  Benign exam otherwise.  Final Clinical  Impressions(s) / ED Diagnoses   Final diagnoses:  Non-intractable vomiting without nausea, unspecified vomiting type    ED Discharge Orders    None       Davonna Belling, MD 03/31/18 1021

## 2018-03-31 NOTE — Discharge Instructions (Addendum)
Continue antibiotics for the hand cellulitis.  Follow-up with your primary care doctor.  Take the nausea medicine that you have as needed for vomiting.

## 2018-04-02 DIAGNOSIS — L821 Other seborrheic keratosis: Secondary | ICD-10-CM | POA: Diagnosis not present

## 2018-04-02 DIAGNOSIS — L570 Actinic keratosis: Secondary | ICD-10-CM | POA: Diagnosis not present

## 2018-04-02 DIAGNOSIS — Z85828 Personal history of other malignant neoplasm of skin: Secondary | ICD-10-CM | POA: Diagnosis not present

## 2018-04-10 ENCOUNTER — Ambulatory Visit: Payer: Self-pay | Admitting: *Deleted

## 2018-04-10 NOTE — Telephone Encounter (Signed)
Will route to office for provider final disposition.

## 2018-04-10 NOTE — Telephone Encounter (Signed)
Can inject glute tendon, try injection in the hip or bursitis of the hip.  Otherwise not much else we can do.

## 2018-04-10 NOTE — Telephone Encounter (Signed)
Called & spoke to pt, she states that she is having a lot of left hip/leg pain. She stated that she's able to walk around during the day & the pain isn't that bad but at night time she is unable to sleep. She is asking what can she do?

## 2018-04-10 NOTE — Telephone Encounter (Signed)
See note

## 2018-04-10 NOTE — Telephone Encounter (Signed)
Routed to [facility] in error; will re-route to [employer].

## 2018-04-10 NOTE — Telephone Encounter (Signed)
Please attach triage note.

## 2018-04-11 NOTE — Telephone Encounter (Signed)
Spoke to pt, she stated that she was unable to have another injection because she is scheduled for a hip replacement in 60 days. Pt stated she was scheduled for a f/u with Dr. Mayer Camel next week so I advised her to discuss this with Dr. Mayer Camel. Pt understood.

## 2018-04-18 DIAGNOSIS — M7062 Trochanteric bursitis, left hip: Secondary | ICD-10-CM | POA: Diagnosis not present

## 2018-04-21 NOTE — Progress Notes (Signed)
[name] [place], [place] [address] Phone: [phone] Subjective:   Christina [name], am serving as a scribe for Dr. Hulan Saas.   CC: Left hip pain follow-up  XIP:[id number]  Christina [name] is a 67 y.o. female coming in with complaint of left hip pain. She has gone to ortho and discussed hip pain. Was given injection in her hip. This did help alleviate some pain. She was told that she did not need a hip replacement. Patient feels like left leg is going to give out when walking. Patient is going to [facility] in May and wants to know if she should get a second opinion for her hip pain as she wants to have replacement if needed soon so that she can enjoy her trip. Patient's MRI did show a degenerative labral tear.  Patient did have acute injury to the gluteal tendon.  Moderate arthritic changes noted of the left hip      Past Medical History:  Diagnosis Date  . Abrasion of right leg 12/17/2014  . Adrenal insufficiency ([place])   . Breast cancer of upper-outer quadrant of left female breast ([place]) 12/07/2014  . Cataract, immature    bilateral  . Dental bridge present    upper - x 2  . Dental crowns present   . Hypothyroidism   . Personal history of radiation therapy   . Prediabetes    Past Surgical History:  Procedure Laterality Date  . ABDOMINOPLASTY    . BALLOON SINUPLASTY    . BREAST LUMPECTOMY Left 2016  . BREAST LUMPECTOMY WITH RADIOACTIVE SEED AND SENTINEL LYMPH NODE BIOPSY Left 12/22/2014   Procedure: BREAST LUMPECTOMY WITH RADIOACTIVE SEED AND SENTINEL LYMPH NODE BIOPSY;  Surgeon: Stark Klein, MD;  Location: [facility];  Service: General;  Laterality: Left;  . BUNIONECTOMY Right   . HAMMER TOE SURGERY Bilateral   . RHINOPLASTY    . SEPTOPLASTY     Social History   Socioeconomic History  . Marital status: Widowed    Spouse name: Not on file  . Number of children: Not on file  . Years of education: Not on  file  . Highest education level: Not on file  Occupational History    Employer: [employer]  . Financial resource strain: Not on file  . Food insecurity:    Worry: Not on file    Inability: Not on file  . Transportation needs:    Medical: Not on file    Non-medical: Not on file  Tobacco Use  . Smoking status: Former Smoker    Packs/day: 0.00    Years: 0.00    Pack years: 0.00    Last attempt to quit: 05/01/1980    Years since quitting: 38.0  . Smokeless tobacco: Never Used  Substance and Sexual Activity  . Alcohol use: Yes    Alcohol/week: 0.0 standard drinks    Comment: occasionally  . Drug use: [name]  . Sexual activity: Not on file  Lifestyle  . Physical activity:    Days per week: Not on file    Minutes per session: Not on file  . Stress: Not on file  Relationships  . Social connections:    Talks on phone: Not on file    Gets together: Not on file    Attends religious service: Not on file    Active member of club or organization: Not on file    Attends meetings of clubs  or organizations: Not on file    Relationship status: Not on file  Other Topics Concern  . Not on file  Social History Narrative  . Not on file   Allergies  Allergen Reactions  . Celery Oil Other (See Comments)    FEVER BLISTERS  . Doxycycline Nausea And Vomiting  . Rye Grass Flower Pollen Extract [Gramineae Pollens] Swelling    RYE:  SWELLING THROAT - DENIES SOB  . Sulfa Antibiotics Swelling    NOSE  . Tea Swelling    THROAT - DENIES SOB  . Adhesive [Tape] Other (See Comments)    TEARS SKIN   Family History  Problem Relation Age of Onset  . Diabetes Mother   . Diabetes Father   . Cancer Maternal Uncle        pancreatic cancer   . Diabetes Paternal Grandmother   . Diabetes Paternal Grandfather     Current Outpatient Medications (Endocrine & Metabolic):  .  liothyronine (CYTOMEL) 25 MCG tablet, Take 50 mcg by mouth daily. .  metFORMIN (GLUCOPHAGE-XR) 500 MG 24 hr  tablet, Take 500-1,000 mg by mouth 3 (three) times daily. Takes 1000mg  in the morning, 500mg  at lunch, and 500mg  with supper .  methylPREDNISolone (MEDROL) 4 MG tablet, Take 2.5-4 mg by mouth 3 (three) times daily. Takes 4mg  in the morning and 2.5mg  midday and at bedtime  Current Facility-Administered Medications (Endocrine & Metabolic):  .  methylPREDNISolone acetate (DEPO-MEDROL) injection 80 mg    Current Outpatient Medications (Respiratory):  .  albuterol (PROVENTIL HFA;VENTOLIN HFA) 108 (90 BASE) MCG/ACT inhaler, Inhale into the lungs every 6 (six) hours as needed for wheezing or shortness of breath. .  cetirizine (ZYRTEC) 10 MG tablet, Take 10 mg by mouth daily.  .  fluticasone (FLONASE) 50 MCG/ACT nasal spray, Place 1 spray into both nostrils daily. Marland Kitchen  guaiFENesin (MUCINEX) 600 MG 12 hr tablet, Take 600 mg by mouth daily.  .  montelukast (SINGULAIR) 10 MG tablet, Take 10 mg by mouth at bedtime.   Current Outpatient Medications (Analgesics):  .  ibuprofen (ADVIL,MOTRIN) 200 MG tablet, Take 600 mg by mouth every 4 (four) hours as needed for fever, headache, mild pain, moderate pain or cramping. Marland Kitchen  oxyCODONE-acetaminophen (ROXICET) 5-325 MG per tablet, Take 1-2 tablets by mouth every 4 (four) hours as needed for severe pain. (Patient taking differently: Take 0.25-1 tablets by mouth every 4 (four) hours as needed for severe pain. ) .  traMADol (ULTRAM) 50 MG tablet, Take 1 tablet (50 mg total) by mouth every 6 (six) hours as needed.     Current Outpatient Medications (Other):  Marland Kitchen  Alpha Lipoic Acid 200 MG CAPS, Take 600 mg by mouth 2 (two) times daily.  .  Ascorbic Acid (VITAMIN C) 1000 MG tablet, Take 2,000-3,000 mg by mouth 2 (two) times daily. Takes 2000mg  in the morning and 3000mg  at night .  calcium carbonate (OS-CAL) 600 MG TABS tablet, Take 800 mg by mouth 2 (two) times daily with a meal. .  clonazePAM (KLONOPIN) 1 MG tablet, Take 1 mg by mouth at bedtime.  .  Coenzyme Q10 200  MG capsule, Take 200 mg by mouth daily. .  cyclobenzaprine (FLEXERIL) 10 MG tablet, Take 1 tablet (10 mg total) by mouth 3 (three) times daily as needed for muscle spasms. .  Eszopiclone (ESZOPICLONE) 3 MG TABS, Take 1.5 mg by mouth at bedtime. Take immediately before bedtime .  Flaxseed, Linseed, (FLAXSEED OIL) 1000 MG CAPS, Take 1,000 mg by mouth  daily.  .  gabapentin (NEURONTIN) 100 MG capsule, TAKE 2 CAPSULES BY MOUTH EVERY DAY AT BEDTIME .  MAGNESIUM CITRATE PO*, Take 2 tablets by mouth daily. Marland Kitchen  MAGNESIUM LACTATE PO, Take 2 tablets by mouth at bedtime. .  niacin 500 MG tablet, Take 500 mg by mouth daily.  .  nortriptyline (PAMELOR) 10 MG capsule, Take 2 capsules (20 mg total) at bedtime by mouth. .  Nutritional Supplements (SYTRINOL PO), Take 1 tablet by mouth daily.  .  ondansetron (ZOFRAN ODT) 4 MG disintegrating tablet, Take 1 tablet (4 mg total) by mouth every 8 (eight) hours as needed for nausea or vomiting. Marland Kitchen  OVER THE COUNTER MEDICATION, Take 1-2 tablets by mouth 2 (two) times daily. *Nutrient 950 with NAC2* takes 2 tablets in the morning and 1 tablet at lunchtime .  OVER THE COUNTER MEDICATION, Take 2 tablets by mouth daily with breakfast. *LV GB Complex* .  OVER THE COUNTER MEDICATION, Take 1 tablet by mouth 2 (two) times daily. *Regenemax or Biosil* .  OVER THE COUNTER MEDICATION, Take 30 mg by mouth daily. *Borotab* .  OVER THE COUNTER MEDICATION, Take 1 capsule by mouth daily. *Lypozyme* .  potassium chloride (MICRO-K) 10 MEQ CR capsule, Take 2 tabs by mouth twice daily. .  STRONTIUM GLUCONATE-B6-B12-FA PO, Take 2 tablets by mouth daily.  .  Vitamin D, Ergocalciferol, (DRISDOL) 50000 units CAPS capsule, Take 50,000 Units by mouth every Sunday. Marland Kitchen  VITAMIN K, PHYTONADIONE, PO, Take 2 tablets by mouth daily.  Marland Kitchen  zinc gluconate 50 MG tablet, Take 50-100 mg by mouth 2 (two) times daily. Takes 100mg  in the morning and 50mg  later in the day  * These medications belong to multiple  therapeutic classes and are listed under each applicable group.    Past medical history, social, surgical and family history all reviewed in electronic medical record.  [name] pertanent information unless stated regarding to the chief complaint.   Review of Systems:  [name] headache, visual changes, nausea, vomiting, diarrhea, constipation, dizziness, abdominal pain, skin rash, fevers, chills, night sweats, weight loss, swollen lymph nodes, body aches, joint swelling, muscle aches, chest pain, shortness of breath, mood changes.   Objective  Blood pressure 112/64, height 5\' 3"  (1.6 m), weight 103 lb (46.7 kg).    General: [name] apparent distress alert and oriented x3 mood and affect normal, dressed appropriately.  HEENT: Pupils equal, extraocular movements intact  Respiratory: Patient's speak in full sentences and does not appear short of breath  Cardiovascular: [name] lower extremity edema, non tender, [name] erythema  Skin: Warm dry intact with [name] signs of infection or rash on extremities or on axial skeleton.  Abdomen: Soft nontender  Neuro: Cranial nerves II through XII are intact, neurovascularly intact in all extremities with 2+ DTRs and 2+ pulses.  Lymph: [name] lymphadenopathy of posterior or anterior cervical chain or axillae bilaterally.  Gait severely antalgic with external rotated hip MSK:  Non tender with full range of motion and good stability and symmetric strength and tone of shoulders, elbows, wrist, , knee and ankles bilaterally.  Left hip exam shows the patient does externally rotate the hip at this moment.  Patient does have some mild tenderness diffusely in the hip.  Good range of motion Improvement with Corky Sox test.  Mild tightness with straight leg test.  4-5 strength in lower extremities but symmetric to the contralateral side.  Deep tendon reflexes appear to be intact   Impression and Recommendations:     This case  required medical decision making of moderate complexity. The above  documentation has been reviewed and is accurate and complete Lyndal Pulley, DO       Note: This dictation was prepared with Dragon dictation along with smaller phrase technology. Any transcriptional errors that result from this process are unintentional.

## 2018-04-22 ENCOUNTER — Ambulatory Visit: Payer: Medicare Other | Attending: Family Medicine | Admitting: Physical Therapy

## 2018-04-22 ENCOUNTER — Encounter: Payer: Self-pay | Admitting: Family Medicine

## 2018-04-22 ENCOUNTER — Ambulatory Visit (INDEPENDENT_AMBULATORY_CARE_PROVIDER_SITE_OTHER): Payer: Medicare Other | Admitting: Family Medicine

## 2018-04-22 ENCOUNTER — Other Ambulatory Visit: Payer: Self-pay

## 2018-04-22 ENCOUNTER — Encounter: Payer: Self-pay | Admitting: Physical Therapy

## 2018-04-22 VITALS — BP 112/64 | Ht 63.0 in | Wt 103.0 lb

## 2018-04-22 DIAGNOSIS — M25652 Stiffness of left hip, not elsewhere classified: Secondary | ICD-10-CM | POA: Insufficient documentation

## 2018-04-22 DIAGNOSIS — M6281 Muscle weakness (generalized): Secondary | ICD-10-CM | POA: Insufficient documentation

## 2018-04-22 DIAGNOSIS — M62838 Other muscle spasm: Secondary | ICD-10-CM

## 2018-04-22 DIAGNOSIS — M1612 Unilateral primary osteoarthritis, left hip: Secondary | ICD-10-CM

## 2018-04-22 DIAGNOSIS — M25552 Pain in left hip: Secondary | ICD-10-CM | POA: Diagnosis not present

## 2018-04-22 NOTE — Patient Instructions (Signed)
Access Code: [email or web address]  URL: [email or web address]  Date: 04/22/2018  Prepared by: Lovett Calender   Exercises  Standing Hamstring Stretch with Step - 3 reps - 1 sets - 30 sec hold - 1x daily - 7x weekly  Standing ITB Stretch - 10 reps - 3 sets - 1x daily - 7x weekly  Supine Piriformis Stretch - 3 reps - 1 sets - 30 sec hold - 1x daily - 7x weekly

## 2018-04-22 NOTE — Patient Instructions (Signed)
Good to see you  Ice is your friend PT will be calling you  pennsaid pinkie amount topically 2 times daily as needed.  Keep being active and listen to your body  See me again in 6 ish weeks

## 2018-04-22 NOTE — Therapy (Signed)
[facility] [address] [place], [place], [address] Phone: [phone]   Fax:  [phone]  Physical Therapy Evaluation  Patient Details  Name: Christina [name] MRN: [id number] Date of Birth: 29-Oct-1950 Referring Provider (PT): Lyndal Pulley, DO   Encounter Date: 04/22/2018  PT End of Session - 04/22/18 1419    Visit Number  1    Number of Visits  10    Date for PT Re-Evaluation  06/17/18    Authorization Type  medicare    PT Start Time  2119    PT Stop Time  1525    PT Time Calculation (min)  40 min    Activity Tolerance  Patient tolerated treatment well    Behavior During Therapy  [facility] for tasks assessed/performed       Past Medical History:  Diagnosis Date  . Abrasion of right leg 12/17/2014  . Adrenal insufficiency ([place])   . Breast cancer of upper-outer quadrant of left female breast ([place]) 12/07/2014  . Cataract, immature    bilateral  . Dental bridge present    upper - x 2  . Dental crowns present   . Hypothyroidism   . Personal history of radiation therapy   . Prediabetes     Past Surgical History:  Procedure Laterality Date  . ABDOMINOPLASTY    . BALLOON SINUPLASTY    . BREAST LUMPECTOMY Left 2016  . BREAST LUMPECTOMY WITH RADIOACTIVE SEED AND SENTINEL LYMPH NODE BIOPSY Left 12/22/2014   Procedure: BREAST LUMPECTOMY WITH RADIOACTIVE SEED AND SENTINEL LYMPH NODE BIOPSY;  Surgeon: Stark Klein, MD;  Location: [facility];  Service: General;  Laterality: Left;  . BUNIONECTOMY Right   . HAMMER TOE SURGERY Bilateral   . RHINOPLASTY    . SEPTOPLASTY      There were no vitals filed for this visit.   Subjective Assessment - 04/22/18 1446    Subjective  Patient reports that she fell 2-3 months ago.  Pt had injection in lateral hip.  She had two people look at MRI of hip and states they felt she didn't need a hip replace    Limitations  Standing    How long can you walk comfortably?   2-3 miles    Patient Stated Goals  be able to stand on that leg    Currently in Pain?  Yes    Pain Score  0-No pain   has pain when I touch it and standing on that hip 9/10   Pain Location  Hip    Pain Orientation  Left    Pain Descriptors / Indicators  Aching;Burning    Pain Type  Acute pain    Pain Onset  More than a month ago    Pain Frequency  Intermittent    Aggravating Factors   standing on the Left leg and mounting the horse, lying on left side    Pain Relieving Factors  injection, advil    Effect of Pain on Daily Activities  cleaning and not sure about horse riding yet    Multiple Pain Sites  No         [facility] PT Assessment - 04/23/18 0001      Assessment   Medical Diagnosis  M16.12 (ICD-10-CM) - Arthritis of left hip    Referring Provider (PT)  Lyndal Pulley, DO    Onset Date/Surgical Date  --   a few months ago   Prior Therapy  No  Precautions   Precautions  None      Restrictions   Weight Bearing Restrictions  No      Balance Screen   Has the patient fallen in the past 6 months  Yes    How many times?  1x due to misstep when throwing trash bag      [place] residence    Living Arrangements  Alone      Prior Function   Level of [place]  Retired      Associate Professor   Overall Cognitive Status  Within Functional Limits for tasks assessed      Observation/Other Assessments   Focus on Therapeutic Outcomes (FOTO)   50% limited      Posture/Postural Control   Posture/Postural Control  Postural limitations    Postural Limitations  Rounded Shoulders      AROM   Overall AROM Comments  WFL +pain with hip flexion/abduction      PROM   Overall PROM Comments  WFL      Strength   Overall Strength Comments  3/5 left hip flexion, abduction; 4/5 left hip adduction      Flexibility   Soft Tissue Assessment /Muscle Length  yes    Hamstrings  25% limited with pain at end range Lt>Rt       Palpation   SI assessment   posterior rotation Lt hip    Palpation comment  tight and tender Lt glutes; tender around Left GT      Ambulation/Gait   Gait Pattern  Decreased stride length   stiff and guarded               Objective measurements completed on examination: See above findings.      [facility] Adult PT Treatment/Exercise - 04/23/18 0001      Self-Care   Self-Care  Other Self-Care Comments    Other Self-Care Comments   educated and performed initial HEP             PT Education - 04/22/18 1752    Education Details   Access Code: [email or web address]     Person(s) Educated  Patient    Methods  Explanation;Demonstration;Handout;Verbal cues    Comprehension  Verbalized understanding;Returned demonstration       PT Short Term Goals - 04/22/18 1431      PT SHORT TERM GOAL #1   Title  Pt will demo consistency and independence with her HEP to decrease pain and improve BLE strength.     Time  3    Period  Weeks    Status  New    Target Date  05/13/18      PT SHORT TERM GOAL #2   Title  Report 25% less hip pain    Baseline  .Marland Kitchen    Time  3    Period  Weeks    Status  New    Target Date  05/13/18      PT SHORT TERM GOAL #3   Title  ...        PT Long Term Goals - 04/22/18 1432      PT LONG TERM GOAL #1   Title  Pt will demo improved BLE hip flexion and abduction strength to 5/5 MMT which will increase her safety with daily activity.     Baseline  ...    Time  8    Period  Weeks    Status  New    Target Date  06/17/18      PT LONG TERM GOAL #2   Title  Pt will be able to mount and ride horse without increased hip pain due to improved LE strength    Baseline  not riding currently    Time  8    Period  Weeks    Status  New    Target Date  06/17/18      PT LONG TERM GOAL #3   Title  Pt will report atleast 75% improvement in her symptoms to allow for her to resume all prior activities without difficulty.     Baseline  .Marland Kitchen    Time  8    Period  Weeks     Status  New    Target Date  06/17/18      PT LONG TERM GOAL #4   Title  FOTO < or = to 40% limited    Baseline  50% limited    Time  8    Period  Weeks    Status  New             Plan - 04/22/18 1751    Clinical Impression Statement  Pt presents to clinic due to Left hip pain from a fall several months ago.  Pt fell directly on her left hip. She had MRI that reports glute med tearing, trochanteric bursitis on left.  Per imaging report there is moderate wearing of cartilage and acetabular thinning.  Pt demostrates pain and weakness with hip flexion and abduction.  Point tenderness around distal portion of Left GT. Pt has tight hamstrings bilaterally.  Pt has functional leg length difference with posterior rotaiton of left hip and responded well to MET.  Pt has muscle spasms in left glutes.  Pt will benefit from skilled PT to address impairments so she can return to maximum function and community activities.    History and Personal Factors relevant to plan of care:  arthritis, chronic back pain    Clinical Presentation  Stable    Clinical Presentation due to:  Pt is stable    Clinical Decision Making  Moderate    Rehab Potential  Excellent    PT Frequency  2x / week    PT Duration  8 weeks    PT Treatment/Interventions  ADLs/Self Care Home Management;Biofeedback;Cryotherapy;Electrical Stimulation;Iontophoresis 50m/ml Dexamethasone;Moist Heat;Ultrasound;Gait training;Stair training;Therapeutic activities;Therapeutic exercise;Neuromuscular re-education;Patient/family education;Manual techniques;Taping;Dry needling;Passive range of motion    PT Next Visit Plan  modalities (try TENS for pain management and info for home unit as needed); core and hip strengthening, manual and stretching, MET to correct pelvic alignment as needed    PT Home Exercise Plan   Access Code: [email or web address]    Recommended Other Services  eval 12/23    Consulted and Agree with Plan of Care  Patient       Patient  will benefit from skilled therapeutic intervention in order to improve the following deficits and impairments:  Abnormal gait, Increased muscle spasms, Impaired tone, Decreased strength, Pain, Impaired flexibility, Increased fascial restricitons  Visit Diagnosis: Muscle weakness (generalized)  Other muscle spasm  Pain in left hip  Stiffness of left hip, not elsewhere classified     Problem List Patient Active Problem List   Diagnosis Date Noted  . Arthritis of left hip 03/20/2018  . Left hip pain 02/26/2018  . Greater trochanteric bursitis of right hip 06/13/2017  . 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  Breast cancer of upper-outer quadrant of left female breast ([place]) 12/07/2014  . Acute recurrent sinusitis 01/17/2013    Zannie Cove, PT 04/23/2018, 9:26 AM  [facility] [address] [place], [place], [address] Phone: [phone]   Fax:  [phone]  Name: Christina [name] MRN: [id number] Date of Birth: 05-08-1950

## 2018-04-22 NOTE — Assessment & Plan Note (Signed)
Discussed with patient in great length.  Some minimal improvement with the injection.  I do believe that patient has other problems as well including the labral pathology, gluteal tendon as well.  Encourage patient to start with formal physical therapy and referral placed again today.  Patient will hold on any type of surgical intervention.  Would do well with that possible replacement and I think patient will need a replacement before she would consider the possibility of the labral tear secondary to the amount of arthritis.  Continue same medications.  Follow-up with me again in 4 weeks.  Spent  25 minutes with patient face-to-face and had greater than 50% of counseling including as described above in assessment and plan.

## 2018-04-25 ENCOUNTER — Encounter: Payer: Self-pay | Admitting: Physical Therapy

## 2018-04-25 ENCOUNTER — Ambulatory Visit: Payer: Medicare Other | Admitting: Physical Therapy

## 2018-04-25 DIAGNOSIS — M62838 Other muscle spasm: Secondary | ICD-10-CM

## 2018-04-25 DIAGNOSIS — M25652 Stiffness of left hip, not elsewhere classified: Secondary | ICD-10-CM | POA: Diagnosis not present

## 2018-04-25 DIAGNOSIS — M6281 Muscle weakness (generalized): Secondary | ICD-10-CM

## 2018-04-25 DIAGNOSIS — M25552 Pain in left hip: Secondary | ICD-10-CM | POA: Diagnosis not present

## 2018-04-25 NOTE — Therapy (Signed)
[facility] [address], [place], [place], [address] Phone: [phone]   Fax:  [phone]  Physical Therapy Treatment  Patient Details  Name: Christina [name] MRN: [id number] Date of Birth: 09-Feb-1951 Referring Provider (PT): Lyndal Pulley, DO   Encounter Date: 04/25/2018  PT End of Session - 04/25/18 0959    Visit Number  2    Number of Visits  10    Date for PT Re-Evaluation  06/17/18    Authorization Type  medicare    PT Start Time  0930    PT Stop Time  1010    PT Time Calculation (min)  40 min    Activity Tolerance  Patient tolerated treatment well;No increased pain    Behavior During Therapy  WFL for tasks assessed/performed       Past Medical History:  Diagnosis Date  . Abrasion of right leg 12/17/2014  . Adrenal insufficiency ([place])   . Breast cancer of upper-outer quadrant of left female breast ([place]) 12/07/2014  . Cataract, immature    bilateral  . Dental bridge present    upper - x 2  . Dental crowns present   . Hypothyroidism   . Personal history of radiation therapy   . Prediabetes     Past Surgical History:  Procedure Laterality Date  . ABDOMINOPLASTY    . BALLOON SINUPLASTY    . BREAST LUMPECTOMY Left 2016  . BREAST LUMPECTOMY WITH RADIOACTIVE SEED AND SENTINEL LYMPH NODE BIOPSY Left 12/22/2014   Procedure: BREAST LUMPECTOMY WITH RADIOACTIVE SEED AND SENTINEL LYMPH NODE BIOPSY;  Surgeon: Stark Klein, MD;  Location: [place];  Service: General;  Laterality: Left;  . BUNIONECTOMY Right   . HAMMER TOE SURGERY Bilateral   . RHINOPLASTY    . SEPTOPLASTY      There were no vitals filed for this visit.  Subjective Assessment - 04/25/18 0935    Subjective  Pt reports that things are going well. She is working on her exercises and the Nustep at the gym. She was very tired after her last session.     Limitations  Standing    How long can you walk comfortably?  2-3 miles     Patient Stated Goals  be able to stand on that leg    Currently in Pain?  No/denies    Pain Onset  More than a month ago                       [facility] Adult PT Treatment/Exercise - 04/25/18 0001      Exercises   Exercises  Knee/Hip      Knee/Hip Exercises: Stretches   Other Knee/Hip Stretches  seated Lt glute stretch 3x20 sec       Knee/Hip Exercises: Aerobic   Nustep  intervals L1 to L4 every minute, x6 min       Knee/Hip Exercises: Standing   Hip Abduction  Left;Right;AROM;1 set;10 reps    Abduction Limitations  x2 sets on Lt only    Other Standing Knee Exercises  tandem stance with each LE forward 2x20 sec each      Knee/Hip Exercises: Supine   Other Supine Knee/Hip Exercises  Lt hip abduction/adduction slide 2x10 reps     Other Supine Knee/Hip Exercises  clamshell isometric 10x3 sec hold; B hip abduction isometric hold 10x3 sec              PT Education - 04/25/18  0958    Education Details  technique with therex; addition to HEP; glute activation with ambulation    Person(s) Educated  Patient    Methods  Explanation;Verbal cues    Comprehension  Verbalized understanding;Returned demonstration       PT Short Term Goals - 04/25/18 1014      PT SHORT TERM GOAL #1   Title  Pt will demo consistency and independence with her HEP to decrease pain and improve BLE strength.     Time  3    Period  Weeks    Status  Achieved      PT SHORT TERM GOAL #2   Title  Report 25% less hip pain    Baseline  .Marland Kitchen    Time  3    Period  Weeks    Status  New      PT SHORT TERM GOAL #3   Title  ...        PT Long Term Goals - 04/22/18 1432      PT LONG TERM GOAL #1   Title  Pt will demo improved BLE hip flexion and abduction strength to 5/5 MMT which will increase her safety with daily activity.     Baseline  ...    Time  8    Period  Weeks    Status  New    Target Date  06/17/18      PT LONG TERM GOAL #2   Title  Pt will be able to mount and ride  horse without increased hip pain due to improved LE strength    Baseline  not riding currently    Time  8    Period  Weeks    Status  New    Target Date  06/17/18      PT LONG TERM GOAL #3   Title  Pt will report atleast 75% improvement in her symptoms to allow for her to resume all prior activities without difficulty.     Baseline  .Marland Kitchen    Time  8    Period  Weeks    Status  New    Target Date  06/17/18      PT LONG TERM GOAL #4   Title  FOTO < or = to 40% limited    Baseline  50% limited    Time  8    Period  Weeks    Status  New            Plan - 04/25/18 1011    Clinical Impression Statement  Today's session focused on therex to promote Lt gluteal strength. Pt was able to complete gentle isometric and isotonic strengthening without increase in hip pain, but she did have some discomfort during weight bearing isometric hold. Pt noted difficulty with RLE clearance which is likely due to her positive trendelenburg deviation during ambulation. This was improved following therapist cues to active Lt glute while walking. Ended without increase in Lt hip soreness. Will continue with current POC.    Rehab Potential  Excellent    PT Frequency  2x / week    PT Duration  8 weeks    PT Treatment/Interventions  ADLs/Self Care Home Management;Biofeedback;Cryotherapy;Electrical Stimulation;Iontophoresis 70m/ml Dexamethasone;Moist Heat;Ultrasound;Gait training;Stair training;Therapeutic activities;Therapeutic exercise;Neuromuscular re-education;Patient/family education;Manual techniques;Taping;Dry needling;Passive range of motion    PT Next Visit Plan  modalities as needed (try TENS for pain management and info for home unit as needed); core and hip strengthening, manual and stretching, MET to correct pelvic  alignment as needed    PT Home Exercise Plan   Access Code: [email or web address]     Consulted and Agree with Plan of Care  Patient       Patient will benefit from skilled therapeutic intervention  in order to improve the following deficits and impairments:  Abnormal gait, Increased muscle spasms, Impaired tone, Decreased strength, Pain, Impaired flexibility, Increased fascial restricitons  Visit Diagnosis: Muscle weakness (generalized)  Other muscle spasm  Pain in left hip  Stiffness of left hip, not elsewhere classified     Problem List Patient Active Problem List   Diagnosis Date Noted  . Arthritis of left hip 03/20/2018  . Left hip pain 02/26/2018  . Greater trochanteric bursitis of right hip 06/13/2017  . 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 Breast cancer of upper-outer quadrant of left female breast ([place]) 12/07/2014  . Acute recurrent sinusitis 01/17/2013    10:21 AM,04/25/18 Sherol Dade PT, DPT [facility] [address], [place], [place], [address] Phone: [phone]   Fax:  [phone]  Name: Christina [name] MRN: [id number] Date of Birth: [birth year]

## 2018-04-25 NOTE — Patient Instructions (Addendum)
Access Code: [email or web address]  URL: [email or web address]  Date: 04/25/2018  Prepared by: Sherol Dade   Access Code: [email or web address]  URL: [email or web address]  Date: 04/25/2018  Prepared by: Sherol Dade   Exercises  Standing Hamstring Stretch with Step - 3 reps - 1 sets - 30 sec hold - 1x daily - 7x weekly  Standing ITB Stretch - 10 reps - 3 sets - 1x daily - 7x weekly  Supine Piriformis Stretch - 3 reps - 1 sets - 30 sec hold - 1x daily - 7x weekly  Standing Hip Abduction with Counter Support - 10 reps - 2 sets - 1x daily - 7x weekly  Hooklying Isometric Hip Abduction with Belt - 10 reps - 2 sets - 3 hold - 1x daily - 7x weekly    [facility] [address] [place], [place] [address] Phone # [phone] Fax [phone]

## 2018-05-03 ENCOUNTER — Ambulatory Visit: Payer: PPO | Attending: Family Medicine | Admitting: Physical Therapy

## 2018-05-03 DIAGNOSIS — M25652 Stiffness of left hip, not elsewhere classified: Secondary | ICD-10-CM | POA: Diagnosis not present

## 2018-05-03 DIAGNOSIS — M62838 Other muscle spasm: Secondary | ICD-10-CM | POA: Diagnosis not present

## 2018-05-03 DIAGNOSIS — M25552 Pain in left hip: Secondary | ICD-10-CM | POA: Insufficient documentation

## 2018-05-03 DIAGNOSIS — M6281 Muscle weakness (generalized): Secondary | ICD-10-CM | POA: Diagnosis not present

## 2018-05-03 NOTE — Therapy (Signed)
[facility] [address] [place], [place], [address] Phone: [phone]   Fax:  [phone]  Physical Therapy Treatment  Patient Details  Name: Christina [name] MRN: [id number] Date of Birth: 1950/07/26 Referring Provider (PT): Lyndal Pulley, DO   Encounter Date: 05/03/2018  PT End of Session - 05/03/18 1110    Visit Number  3    Number of Visits  10    Date for PT Re-Evaluation  06/17/18    Authorization Type  medicare    PT Start Time  6734    PT Stop Time  1140    PT Time Calculation (min)  42 min    Activity Tolerance  Patient tolerated treatment well;No increased pain    Behavior During Therapy  WFL for tasks assessed/performed       Past Medical History:  Diagnosis Date  . Abrasion of right leg 12/17/2014  . Adrenal insufficiency ([place])   . Breast cancer of upper-outer quadrant of left female breast ([place]) 12/07/2014  . Cataract, immature    bilateral  . Dental bridge present    upper - x 2  . Dental crowns present   . Hypothyroidism   . Personal history of radiation therapy   . Prediabetes     Past Surgical History:  Procedure Laterality Date  . ABDOMINOPLASTY    . BALLOON SINUPLASTY    . BREAST LUMPECTOMY Left 2016  . BREAST LUMPECTOMY WITH RADIOACTIVE SEED AND SENTINEL LYMPH NODE BIOPSY Left 12/22/2014   Procedure: BREAST LUMPECTOMY WITH RADIOACTIVE SEED AND SENTINEL LYMPH NODE BIOPSY;  Surgeon: Stark Klein, MD;  Location: [place];  Service: General;  Laterality: Left;  . BUNIONECTOMY Right   . HAMMER TOE SURGERY Bilateral   . RHINOPLASTY    . SEPTOPLASTY      There were no vitals filed for this visit.  Subjective Assessment - 05/03/18 1104    Subjective  Pt states she was able to ride her horse.  She is sore but it did okay.      Patient Stated Goals  be able to stand on that leg    Currently in Pain?  No/denies                       [facility] Adult PT  Treatment/Exercise - 05/03/18 0001      Self-Care   Other Self-Care Comments   educated and performed self massage with massage stick      Knee/Hip Exercises: Stretches   Active Hamstring Stretch  Right;Left;1 rep;30 seconds    Piriformis Stretch  Right;Left;1 rep;30 seconds      Knee/Hip Exercises: Aerobic   Nustep  intervals L1 to L4 every minute, x6 min       Knee/Hip Exercises: Supine   Bridges  Strengthening;Both;20 reps    Other Supine Knee/Hip Exercises  clam red band - 2 x 10 single leg      Knee/Hip Exercises: Sidelying   Hip ABduction  Strengthening;Right;Left;2 sets;10 reps    Hip ADduction  Strengthening;Right;Left;2 sets;10 reps      Manual Therapy   Manual Therapy  Soft tissue mobilization    Soft tissue mobilization  IT band, glutes - left side               PT Short Term Goals - 04/25/18 1014      PT SHORT TERM GOAL #1   Title  Pt will demo consistency and independence with  her HEP to decrease pain and improve BLE strength.     Time  3    Period  Weeks    Status  Achieved      PT SHORT TERM GOAL #2   Title  Report 25% less hip pain    Baseline  .Marland Kitchen    Time  3    Period  Weeks    Status  New      PT SHORT TERM GOAL #3   Title  ...        PT Long Term Goals - 04/22/18 1432      PT LONG TERM GOAL #1   Title  Pt will demo improved BLE hip flexion and abduction strength to 5/5 MMT which will increase her safety with daily activity.     Baseline  ...    Time  8    Period  Weeks    Status  New    Target Date  06/17/18      PT LONG TERM GOAL #2   Title  Pt will be able to mount and ride horse without increased hip pain due to improved LE strength    Baseline  not riding currently    Time  8    Period  Weeks    Status  New    Target Date  06/17/18      PT LONG TERM GOAL #3   Title  Pt will report atleast 75% improvement in her symptoms to allow for her to resume all prior activities without difficulty.     Baseline  .Marland Kitchen    Time  8     Period  Weeks    Status  New    Target Date  06/17/18      PT LONG TERM GOAL #4   Title  FOTO < or = to 40% limited    Baseline  50% limited    Time  8    Period  Weeks    Status  New            Plan - 05/03/18 1154    Clinical Impression Statement  Pt did well with exercises today and no increased pain during or after treatment.  She had trigger points throughout vastus lateralis muscle and glute med on left side with tight and tender IT band.  Pt responded well to manual and able to use massage stick to roll out.  Pt fatigues quickly during exercises and needs cues to keep pelvis neutral.  Continue working towards functional goals.    PT Treatment/Interventions  ADLs/Self Care Home Management;Biofeedback;Cryotherapy;Electrical Stimulation;Iontophoresis 51m/ml Dexamethasone;Moist Heat;Ultrasound;Gait training;Stair training;Therapeutic activities;Therapeutic exercise;Neuromuscular re-education;Patient/family education;Manual techniques;Taping;Dry needling;Passive range of motion    PT Next Visit Plan  core and hip strengthening, manual and stretching, MET to correct pelvic alignment as needed    PT Home Exercise Plan   Access Code: [email or web address]    Recommended Other Services  eval 102/54cert signed    Consulted and Agree with Plan of Care  Patient       Patient will benefit from skilled therapeutic intervention in order to improve the following deficits and impairments:  Abnormal gait, Increased muscle spasms, Impaired tone, Decreased strength, Pain, Impaired flexibility, Increased fascial restricitons  Visit Diagnosis: Muscle weakness (generalized)  Other muscle spasm  Pain in left hip  Stiffness of left hip, not elsewhere classified     Problem List Patient Active Problem List   Diagnosis Date Noted  . Arthritis of left  hip 03/20/2018  . Left hip pain 02/26/2018  . Greater trochanteric bursitis of right hip 06/13/2017  . 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 Breast cancer of upper-outer quadrant of left female breast ([place]) 12/07/2014  . Acute recurrent sinusitis 01/17/2013    Zannie Cove, PT 05/03/2018, 11:57 AM  [facility] [address], [place], [place], [address] Phone: [phone]   Fax:  [phone]  Name: Christina [name] MRN: [id number] Date of Birth: [birth year]

## 2018-05-07 ENCOUNTER — Ambulatory Visit: Payer: PPO | Admitting: Physical Therapy

## 2018-05-07 DIAGNOSIS — M25552 Pain in left hip: Secondary | ICD-10-CM

## 2018-05-07 DIAGNOSIS — M6281 Muscle weakness (generalized): Secondary | ICD-10-CM

## 2018-05-07 DIAGNOSIS — M25652 Stiffness of left hip, not elsewhere classified: Secondary | ICD-10-CM

## 2018-05-07 DIAGNOSIS — M62838 Other muscle spasm: Secondary | ICD-10-CM

## 2018-05-07 NOTE — Patient Instructions (Signed)

## 2018-05-07 NOTE — Therapy (Signed)
[facility] [address] [place], [place], [address] Phone: [phone]   Fax:  [phone]  Physical Therapy Treatment  Patient Details  Name: Christina [name] MRN: [id number] Date of Birth: 01-11-1951 Referring Provider (PT): Lyndal Pulley, DO   Encounter Date: 05/07/2018  PT End of Session - 05/07/18 1005    Visit Number  4    Number of Visits  10    Date for PT Re-Evaluation  06/17/18    Authorization Type  medicare    PT Start Time  1005    PT Stop Time  1050    PT Time Calculation (min)  45 min    Activity Tolerance  Patient tolerated treatment well;No increased pain    Behavior During Therapy  WFL for tasks assessed/performed       Past Medical History:  Diagnosis Date  . Abrasion of right leg 12/17/2014  . Adrenal insufficiency ([place])   . Breast cancer of upper-outer quadrant of left female breast ([place]) 12/07/2014  . Cataract, immature    bilateral  . Dental bridge present    upper - x 2  . Dental crowns present   . Hypothyroidism   . Personal history of radiation therapy   . Prediabetes     Past Surgical History:  Procedure Laterality Date  . ABDOMINOPLASTY    . BALLOON SINUPLASTY    . BREAST LUMPECTOMY Left 2016  . BREAST LUMPECTOMY WITH RADIOACTIVE SEED AND SENTINEL LYMPH NODE BIOPSY Left 12/22/2014   Procedure: BREAST LUMPECTOMY WITH RADIOACTIVE SEED AND SENTINEL LYMPH NODE BIOPSY;  Surgeon: Stark Klein, MD;  Location: [place];  Service: General;  Laterality: Left;  . BUNIONECTOMY Right   . HAMMER TOE SURGERY Bilateral   . RHINOPLASTY    . SEPTOPLASTY      There were no vitals filed for this visit.  Subjective Assessment - 05/07/18 1101    Subjective  Pt states she rode her horse yesterday and is very sore now.  States she feels unsteady and she needed to hold onto her horse after riding.    Patient Stated Goals  be able to stand on that leg    Currently in Pain?   Yes    Pain Score  6                        [facility] Adult PT Treatment/Exercise - 05/07/18 0001      Knee/Hip Exercises: Stretches   Active Hamstring Stretch  Right;Left;30 seconds;2 reps    Control and instrumentation engineer reps;30 seconds   prone and supine with hip flexor stretch     Knee/Hip Exercises: Aerobic   Nustep  L1x 3 min; L2 x 3 min      Knee/Hip Exercises: Supine   Bridges  Strengthening;Both;20 reps   10 small, 10 full range     Knee/Hip Exercises: Sidelying   Hip ABduction  Strengthening;Left;5 reps    Clams  3x10 reps      Modalities   Modalities  Iontophoresis      Iontophoresis   Type of Iontophoresis  Dexamethasone    Location  Lt hip inferior to GT    Dose  1.0 mL    Time  4-6 hour release      Manual Therapy   Soft tissue mobilization  Lt quad               PT Short Term Goals -  04/25/18 1014      PT SHORT TERM GOAL #1   Title  Pt will demo consistency and independence with her HEP to decrease pain and improve BLE strength.     Time  3    Period  Weeks    Status  Achieved      PT SHORT TERM GOAL #2   Title  Report 25% less hip pain    Baseline  .Marland Kitchen    Time  3    Period  Weeks    Status  New      PT SHORT TERM GOAL #3   Title  ...        PT Long Term Goals - 04/22/18 1432      PT LONG TERM GOAL #1   Title  Pt will demo improved BLE hip flexion and abduction strength to 5/5 MMT which will increase her safety with daily activity.     Baseline  ...    Time  8    Period  Weeks    Status  New    Target Date  06/17/18      PT LONG TERM GOAL #2   Title  Pt will be able to mount and ride horse without increased hip pain due to improved LE strength    Baseline  not riding currently    Time  8    Period  Weeks    Status  New    Target Date  06/17/18      PT LONG TERM GOAL #3   Title  Pt will report atleast 75% improvement in her symptoms to allow for her to resume all prior activities without difficulty.     Baseline  .Marland Kitchen     Time  8    Period  Weeks    Status  New    Target Date  06/17/18      PT LONG TERM GOAL #4   Title  FOTO < or = to 40% limited    Baseline  50% limited    Time  8    Period  Weeks    Status  New            Plan - 05/07/18 1054    Clinical Impression Statement  Pt responded well to exercises with decreased limp after session.  She is fatigued with clamshell and had some increased hip pain with hip abduction. Pt needed cues to perform stretches and exercises to correctly target the intented muscles.  She will benefit from skilled PT to progress her strength and stability in order to return to maximum function.    PT Treatment/Interventions  ADLs/Self Care Home Management;Biofeedback;Cryotherapy;Electrical Stimulation;Iontophoresis 4mg /ml Dexamethasone;Moist Heat;Ultrasound;Gait training;Stair training;Therapeutic activities;Therapeutic exercise;Neuromuscular re-education;Patient/family education;Manual techniques;Taping;Dry needling;Passive range of motion    PT Next Visit Plan  f/u on ionto, core and hip strengthening, quad trigger point release    PT Home Exercise Plan   Access Code: [email or web address]    Consulted and Agree with Plan of Care  Patient       Patient will benefit from skilled therapeutic intervention in order to improve the following deficits and impairments:  Abnormal gait, Increased muscle spasms, Impaired tone, Decreased strength, Pain, Impaired flexibility, Increased fascial restricitons  Visit Diagnosis: Muscle weakness (generalized)  Other muscle spasm  Pain in left hip  Stiffness of left hip, not elsewhere classified     Problem List Patient Active Problem List   Diagnosis Date Noted  . Arthritis of left  hip 03/20/2018  . Left hip pain 02/26/2018  . Greater trochanteric bursitis of right hip 06/13/2017  . 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 Breast cancer of upper-outer quadrant of left female breast ([place]) 12/07/2014  .  Acute recurrent sinusitis 01/17/2013    Zannie Cove, PT 05/07/2018, 11:07 AM  [facility] [address] [place], [place], [address] Phone: [phone]   Fax:  [phone]  Name: LATEASHA BREUER MRN: [id number] Date of Birth: 23-Mar-1951

## 2018-05-09 ENCOUNTER — Ambulatory Visit: Payer: PPO | Admitting: Physical Therapy

## 2018-05-09 ENCOUNTER — Encounter: Payer: Self-pay | Admitting: Physical Therapy

## 2018-05-09 DIAGNOSIS — M62838 Other muscle spasm: Secondary | ICD-10-CM

## 2018-05-09 DIAGNOSIS — M6281 Muscle weakness (generalized): Secondary | ICD-10-CM | POA: Diagnosis not present

## 2018-05-09 DIAGNOSIS — M25652 Stiffness of left hip, not elsewhere classified: Secondary | ICD-10-CM

## 2018-05-09 DIAGNOSIS — M25552 Pain in left hip: Secondary | ICD-10-CM

## 2018-05-09 NOTE — Therapy (Signed)
[facility] [address], [place], [place], [address] Phone: [phone]   Fax:  [phone]  Physical Therapy Treatment  Patient Details  Name: Christina [name] MRN: [id number] Date of Birth: [birth year] Referring Provider (PT): Lyndal Pulley, DO   Encounter Date: 05/09/2018  PT End of Session - 05/09/18 1104    Visit Number  5    Number of Visits  10    Date for PT Re-Evaluation  06/17/18    Authorization Type  medicare    PT Start Time  1055    PT Stop Time  1135    PT Time Calculation (min)  40 min    Activity Tolerance  Patient tolerated treatment well;No increased pain    Behavior During Therapy  WFL for tasks assessed/performed       Past Medical History:  Diagnosis Date  . Abrasion of right leg 12/17/2014  . Adrenal insufficiency ([place])   . Breast cancer of upper-outer quadrant of left female breast ([place]) 12/07/2014  . Cataract, immature    bilateral  . Dental bridge present    upper - x 2  . Dental crowns present   . Hypothyroidism   . Personal history of radiation therapy   . Prediabetes     Past Surgical History:  Procedure Laterality Date  . ABDOMINOPLASTY    . BALLOON SINUPLASTY    . BREAST LUMPECTOMY Left 2016  . BREAST LUMPECTOMY WITH RADIOACTIVE SEED AND SENTINEL LYMPH NODE BIOPSY Left 12/22/2014   Procedure: BREAST LUMPECTOMY WITH RADIOACTIVE SEED AND SENTINEL LYMPH NODE BIOPSY;  Surgeon: Stark Klein, MD;  Location: [facility];  Service: General;  Laterality: Left;  . BUNIONECTOMY Right   . HAMMER TOE SURGERY Bilateral   . RHINOPLASTY    . SEPTOPLASTY      There were no vitals filed for this visit.  Subjective Assessment - 05/09/18 1100    Subjective  Pt states she was on the nustep at the gym and now her inguinal region is very sore.  States the patch helped    Patient Stated Goals  be able to stand on that leg    Currently in Pain?  No/denies                        [facility] Adult PT Treatment/Exercise - 05/09/18 0001      Knee/Hip Exercises: Stretches   Active Hamstring Stretch  Right;Left;30 seconds;2 reps    Control and instrumentation engineer reps;30 seconds;Right   prone and supine with hip flexor stretch     Knee/Hip Exercises: Standing   Step Down  5 reps;Hand Hold: 2;Left;Both;Step Height: 2"      Knee/Hip Exercises: Supine   Hip Adduction Isometric  Strengthening;Right;Left;2 sets;10 reps    Other Supine Knee/Hip Exercises  hip abduction/adduction - cues to keep knee pointed to ceiling - 3 x 10 reps    Other Supine Knee/Hip Exercises  clam yellow band - isometric single leg - 2 x 10 bilat      Knee/Hip Exercises: Sidelying   Clams  3x10 reps      Iontophoresis   Type of Iontophoresis  Dexamethasone    Location  Lt hip inferior to GT    Dose  1.0 mL    Time  4-6 hour release   #2              PT Short Term Goals - 05/09/18 1105  PT SHORT TERM GOAL #1   Title  Pt will demo consistency and independence with her HEP to decrease pain and improve BLE strength.     Status  Achieved      PT SHORT TERM GOAL #2   Title  Report 25% less hip pain    Baseline  same on days when I do more, better on days when I don't do as much    Status  On-going        PT Long Term Goals - 04/22/18 1432      PT LONG TERM GOAL #1   Title  Pt will demo improved BLE hip flexion and abduction strength to 5/5 MMT which will increase her safety with daily activity.     Baseline  ...    Time  8    Period  Weeks    Status  New    Target Date  06/17/18      PT LONG TERM GOAL #2   Title  Pt will be able to mount and ride horse without increased hip pain due to improved LE strength    Baseline  not riding currently    Time  8    Period  Weeks    Status  New    Target Date  06/17/18      PT LONG TERM GOAL #3   Title  Pt will report atleast 75% improvement in her symptoms to allow for her to resume all prior activities  without difficulty.     Baseline  .Marland Kitchen    Time  8    Period  Weeks    Status  New    Target Date  06/17/18      PT LONG TERM GOAL #4   Title  FOTO < or = to 40% limited    Baseline  50% limited    Time  8    Period  Weeks    Status  New            Plan - 05/09/18 1140    Clinical Impression Statement  Pt tolerated treatment well.  She reports she was tired after today's treatment but no increased pain.  Pt did fair with small step down and able to perform at least one time with correct form needing a lot of cues.  pt continues to have a lot of weakness of glute med and fatigues quickly.  She will benefit from skilled PT to improve strength.    PT Treatment/Interventions  ADLs/Self Care Home Management;Biofeedback;Cryotherapy;Electrical Stimulation;Iontophoresis 4mg /ml Dexamethasone;Moist Heat;Ultrasound;Gait training;Stair training;Therapeutic activities;Therapeutic exercise;Neuromuscular re-education;Patient/family education;Manual techniques;Taping;Dry needling;Passive range of motion    PT Next Visit Plan  f/u on ionto #2, core and hip strengthening, quad trigger point release    PT Home Exercise Plan   Access Code: [email or web address]     Consulted and Agree with Plan of Care  Patient       Patient will benefit from skilled therapeutic intervention in order to improve the following deficits and impairments:  Abnormal gait, Increased muscle spasms, Impaired tone, Decreased strength, Pain, Impaired flexibility, Increased fascial restricitons  Visit Diagnosis: Muscle weakness (generalized)  Other muscle spasm  Pain in left hip  Stiffness of left hip, not elsewhere classified     Problem List Patient Active Problem List   Diagnosis Date Noted  . Arthritis of left hip 03/20/2018  . Left hip pain 02/26/2018  . Greater trochanteric bursitis of right hip 06/13/2017  . Injury of spinal nerve root  at L3 level 11/29/2016  . Breast cancer of upper-outer quadrant of left female breast  ([place]) 12/07/2014  . Acute recurrent sinusitis 01/17/2013    Zannie Cove, PT 05/09/2018, 11:51 AM  [facility] [address] [place], [place], [address] Phone: [phone]   Fax:  [phone]  Name: AYELEN SCIORTINO MRN: [id number] Date of Birth: [birth year]

## 2018-05-14 ENCOUNTER — Ambulatory Visit: Payer: PPO | Admitting: Physical Therapy

## 2018-05-14 DIAGNOSIS — M25652 Stiffness of left hip, not elsewhere classified: Secondary | ICD-10-CM

## 2018-05-14 DIAGNOSIS — M6281 Muscle weakness (generalized): Secondary | ICD-10-CM | POA: Diagnosis not present

## 2018-05-14 DIAGNOSIS — M62838 Other muscle spasm: Secondary | ICD-10-CM

## 2018-05-14 DIAGNOSIS — M25552 Pain in left hip: Secondary | ICD-10-CM

## 2018-05-14 NOTE — Therapy (Signed)
[facility] [address] [place], [place], [address] Phone: [phone]   Fax:  [phone]  Physical Therapy Treatment  Patient Details  Name: Christina [name] MRN: [id number] Date of Birth: 07/11/1950 Referring Provider (PT): Lyndal Pulley, DO   Encounter Date: 05/14/2018  PT End of Session - 05/14/18 1411    Visit Number  6    Number of Visits  10    Date for PT Re-Evaluation  06/17/18    Authorization Type  medicare    PT Start Time  1100    PT Stop Time  1144    PT Time Calculation (min)  44 min    Activity Tolerance  Patient tolerated treatment well    Behavior During Therapy  [facility] for tasks assessed/performed       Past Medical History:  Diagnosis Date  . Abrasion of right leg 12/17/2014  . Adrenal insufficiency ([place])   . Breast cancer of upper-outer quadrant of left female breast ([place]) 12/07/2014  . Cataract, immature    bilateral  . Dental bridge present    upper - x 2  . Dental crowns present   . Hypothyroidism   . Personal history of radiation therapy   . Prediabetes     Past Surgical History:  Procedure Laterality Date  . ABDOMINOPLASTY    . BALLOON SINUPLASTY    . BREAST LUMPECTOMY Left 2016  . BREAST LUMPECTOMY WITH RADIOACTIVE SEED AND SENTINEL LYMPH NODE BIOPSY Left 12/22/2014   Procedure: BREAST LUMPECTOMY WITH RADIOACTIVE SEED AND SENTINEL LYMPH NODE BIOPSY;  Surgeon: Stark Klein, MD;  Location: [facility];  Service: General;  Laterality: Left;  . BUNIONECTOMY Right   . HAMMER TOE SURGERY Bilateral   . RHINOPLASTY    . SEPTOPLASTY      There were no vitals filed for this visit.  Subjective Assessment - 05/14/18 1109    Subjective  Pt states pain is minmal but feels stiff (4/10)    Currently in Pain?  Yes    Pain Score  1     Pain Location  Hip    Pain Orientation  Left    Pain Descriptors / Indicators  Aching    Pain Type  Acute pain    Pain Onset  More than  a month ago    Pain Frequency  Intermittent    Multiple Pain Sites  No                       [facility] Adult PT Treatment/Exercise - 05/14/18 0001      Lumbar Exercises: Supine   Large Ball Abdominal Isometric  20 reps   roll out red ball     Knee/Hip Exercises: Standing   SLS  single leg standing with cues for gluteal and TrA activation for improved stability and SI locking of standing leg      Knee/Hip Exercises: Supine   Bridges  Strengthening    Other Supine Knee/Hip Exercises  clam red band - isometric single leg - 2 x 10 bilat      Iontophoresis   Type of Iontophoresis  Dexamethasone    Location  Lt hip posterior at glutes attachment near SIJ    Dose  1.0 mL    Time  4-6 hour release   #3     Manual Therapy   Manual Therapy  Muscle Energy Technique    Manual therapy comments  palpation to assess pelvic  alignment - Lt LE shorter, but pelvis appears in alignment    Muscle Energy Technique  ant rotation Rt; post rotation of left - inconclusive results               PT Short Term Goals - 05/09/18 1105      PT SHORT TERM GOAL #1   Title  Pt will demo consistency and independence with her HEP to decrease pain and improve BLE strength.     Status  Achieved      PT SHORT TERM GOAL #2   Title  Report 25% less hip pain    Baseline  same on days when I do more, better on days when I don't do as much    Status  On-going        PT Long Term Goals - 05/14/18 1412      PT LONG TERM GOAL #1   Title  Pt will demo improved BLE hip flexion and abduction strength to 5/5 MMT which will increase her safety with daily activity.     Status  On-going      PT LONG TERM GOAL #2   Title  Pt will be able to mount and ride horse without increased hip pain due to improved LE strength    Baseline  able to ride, is very stiff and sore afterwards    Status  On-going      PT LONG TERM GOAL #3   Title  Pt will report atleast 75% improvement in her symptoms to allow for  her to resume all prior activities without difficulty.     Status  On-going      PT LONG TERM GOAL #4   Title  FOTO < or = to 40% limited    Status  On-going      PT LONG TERM GOAL #5   Title  ...            Plan - 05/14/18 1316    Clinical Impression Statement  Pt was fatigued with single leg activities.  She did better without as much large leg movements but just shifting to the leg and activating core muscles.  Pt was able to feel improved stability when she activates her glutes and TrA in standing.  Pt will benefit from skilled PT to continue pain management strategies and core strengthening    Rehab Potential  Excellent    PT Treatment/Interventions  ADLs/Self Care Home Management;Biofeedback;Cryotherapy;Electrical Stimulation;Iontophoresis 4mg /ml Dexamethasone;Moist Heat;Ultrasound;Gait training;Stair training;Therapeutic activities;Therapeutic exercise;Neuromuscular re-education;Patient/family education;Manual techniques;Taping;Dry needling;Passive range of motion    PT Next Visit Plan  f/u on ionto #3, core and hip strengthening, quad trigger point release    PT Home Exercise Plan   Access Code: [email or web address]     Consulted and Agree with Plan of Care  Patient       Patient will benefit from skilled therapeutic intervention in order to improve the following deficits and impairments:  Abnormal gait, Increased muscle spasms, Impaired tone, Decreased strength, Pain, Impaired flexibility, Increased fascial restricitons  Visit Diagnosis: Muscle weakness (generalized)  Other muscle spasm  Pain in left hip  Stiffness of left hip, not elsewhere classified     Problem List Patient Active Problem List   Diagnosis Date Noted  . Arthritis of left hip 03/20/2018  . Left hip pain 02/26/2018  . Greater trochanteric bursitis of right hip 06/13/2017  . 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 Breast cancer of upper-outer quadrant of left female  breast ([place]) 12/07/2014   . Acute recurrent sinusitis 01/17/2013    Zannie Cove, PT 05/14/2018, 2:17 PM  [facility] [address] [place], [place], [address] Phone: [phone]   Fax:  [phone]  Name: Christina [name] MRN: [id number] Date of Birth: April 17, 1951

## 2018-05-15 ENCOUNTER — Ambulatory Visit (INDEPENDENT_AMBULATORY_CARE_PROVIDER_SITE_OTHER): Payer: PPO | Admitting: Family

## 2018-05-15 ENCOUNTER — Encounter: Payer: Self-pay | Admitting: Family

## 2018-05-15 VITALS — BP 108/78 | HR 74 | Temp 98.0°F | Ht 63.0 in | Wt 106.0 lb

## 2018-05-15 DIAGNOSIS — J309 Allergic rhinitis, unspecified: Secondary | ICD-10-CM

## 2018-05-15 DIAGNOSIS — E2740 Unspecified adrenocortical insufficiency: Secondary | ICD-10-CM | POA: Diagnosis not present

## 2018-05-15 DIAGNOSIS — G4700 Insomnia, unspecified: Secondary | ICD-10-CM

## 2018-05-15 DIAGNOSIS — E039 Hypothyroidism, unspecified: Secondary | ICD-10-CM

## 2018-05-15 DIAGNOSIS — R7303 Prediabetes: Secondary | ICD-10-CM

## 2018-05-15 DIAGNOSIS — M1612 Unilateral primary osteoarthritis, left hip: Secondary | ICD-10-CM

## 2018-05-15 DIAGNOSIS — M85859 Other specified disorders of bone density and structure, unspecified thigh: Secondary | ICD-10-CM | POA: Diagnosis not present

## 2018-05-15 NOTE — Progress Notes (Signed)
Christina [name] is a 68 y.o. female with the following history as recorded in [employer]:  Patient Active Problem List   Diagnosis Date Noted  . Arthritis of left hip 03/20/2018  . Left hip pain 02/26/2018  . Greater trochanteric bursitis of right hip 06/13/2017  . 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 Breast cancer of upper-outer quadrant of left female breast ([place]) 12/07/2014  . Acute recurrent sinusitis 01/17/2013    Current Outpatient Medications  Medication Sig Dispense Refill  . albuterol (PROVENTIL HFA;VENTOLIN HFA) 108 (90 BASE) MCG/ACT inhaler Inhale into the lungs every 6 (six) hours as needed for wheezing or shortness of breath.    Ilean Skill Lipoic Acid 200 MG CAPS Take 600 mg by mouth 2 (two) times daily.     . Ascorbic Acid (VITAMIN C) 1000 MG tablet Take 2,000-3,000 mg by mouth 2 (two) times daily. Takes 2000mg  in the morning and 3000mg  at night    . calcium carbonate (OS-CAL) 600 MG TABS tablet Take 800 mg by mouth 2 (two) times daily with a meal.    . cetirizine (ZYRTEC) 10 MG tablet Take 10 mg by mouth daily.     . clonazePAM (KLONOPIN) 1 MG tablet Take 1 mg by mouth at bedtime.     . Coenzyme Q10 200 MG capsule Take 200 mg by mouth daily.    . cyclobenzaprine (FLEXERIL) 10 MG tablet Take 1 tablet (10 mg total) by mouth 3 (three) times daily as needed for muscle spasms. 90 tablet 0  . Eszopiclone (ESZOPICLONE) 3 MG TABS Take 0.5 mg by mouth at bedtime. Take immediately before bedtime     . Flaxseed, Linseed, (FLAXSEED OIL) 1000 MG CAPS Take 1,000 mg by mouth daily.     . fluticasone (FLONASE) 50 MCG/ACT nasal spray Place 1 spray into both nostrils daily.  11  . gabapentin (NEURONTIN) 100 MG capsule TAKE 2 CAPSULES BY MOUTH EVERY DAY AT BEDTIME 180 capsule 1  . ibuprofen (ADVIL,MOTRIN) 200 MG tablet Take 600 mg by mouth every 4 (four) hours as needed for fever, headache, mild pain, moderate pain or cramping.    Marland Kitchen liothyronine (CYTOMEL) 25 MCG tablet Take 50  mcg by mouth daily.    Marland Kitchen MAGNESIUM CITRATE PO Take 2 tablets by mouth daily.    Marland Kitchen MAGNESIUM LACTATE PO Take 2 tablets by mouth at bedtime.    . metFORMIN (GLUCOPHAGE-XR) 500 MG 24 hr tablet Take 1,000 mg by mouth 2 (two) times daily. Takes 1000mg  in the morning, 500mg  at lunch, and 500mg  with supper  1  . methylPREDNISolone (MEDROL) 4 MG tablet Take 2.5-4 mg by mouth 3 (three) times daily. Takes 4mg  in the morning and 2.5mg  midday and at bedtime    . montelukast (SINGULAIR) 10 MG tablet Take 10 mg by mouth at bedtime.    . niacin 500 MG tablet Take 500 mg by mouth daily.     . Nutritional Supplements (SYTRINOL PO) Take 1 tablet by mouth daily.     Marland Kitchen OVER THE COUNTER MEDICATION Take 1-2 tablets by mouth 2 (two) times daily. *Nutrient 950 with NAC2* takes 2 tablets in the morning and 1 tablet at lunchtime    . OVER THE COUNTER MEDICATION Take 2 tablets by mouth daily with breakfast. *LV GB Complex*    . OVER THE COUNTER MEDICATION Take 1 tablet by mouth 2 (two) times daily. *Regenemax or Biosil*    . OVER THE COUNTER MEDICATION Take 30 mg by  mouth daily. *Borotab*    . OVER THE COUNTER MEDICATION Take 1 capsule by mouth daily. *Lypozyme*    . oxyCODONE-acetaminophen (ROXICET) 5-325 MG per tablet Take 1-2 tablets by mouth every 4 (four) hours as needed for severe pain. (Patient taking differently: Take 0.25-1 tablets by mouth every 4 (four) hours as needed for severe pain. ) 30 tablet 0  . STRONTIUM GLUCONATE-B6-B12-FA PO Take 2 tablets by mouth daily.     . traMADol (ULTRAM) 50 MG tablet Take 1 tablet (50 mg total) by mouth every 6 (six) hours as needed. 20 tablet 0  . Vitamin D, Ergocalciferol, (DRISDOL) 50000 units CAPS capsule Take 50,000 Units by mouth every Sunday.    Marland Kitchen VITAMIN K, PHYTONADIONE, PO Take 2 tablets by mouth daily.     Marland Kitchen zinc gluconate 50 MG tablet Take 50-100 mg by mouth 2 (two) times daily. Takes 100mg  in the morning and 50mg  later in the day    . guaiFENesin (MUCINEX) 600 MG 12  hr tablet Take 600 mg by mouth daily.     . nortriptyline (PAMELOR) 10 MG capsule Take 2 capsules (20 mg total) at bedtime by mouth. (Patient not taking: Reported on 05/15/2018) 180 capsule 1  . ondansetron (ZOFRAN ODT) 4 MG disintegrating tablet Take 1 tablet (4 mg total) by mouth every 8 (eight) hours as needed for nausea or vomiting. (Patient not taking: Reported on 05/15/2018) 12 tablet 0  . potassium chloride (MICRO-K) 10 MEQ CR capsule Take 2 tabs by mouth twice daily.     Current Facility-Administered Medications  Medication Dose Route Frequency Provider Last Rate Last Dose  . methylPREDNISolone acetate (DEPO-MEDROL) injection 80 mg  80 mg Other Once Magnus Sinning, MD        Allergies: Celery oil; Doxycycline; Rye grass flower pollen extract [gramineae pollens]; Sulfa antibiotics; Tea; and Adhesive [tape]  Past Medical History:  Diagnosis Date  . Abrasion of right leg 12/17/2014  . Adrenal insufficiency ([place])   . Breast cancer of upper-outer quadrant of left female breast ([place]) 12/07/2014  . Cataract, immature    bilateral  . Dental bridge present    upper - x 2  . Dental crowns present   . Hypothyroidism   . Personal history of radiation therapy   . Prediabetes     Past Surgical History:  Procedure Laterality Date  . ABDOMINOPLASTY    . BALLOON SINUPLASTY    . BREAST LUMPECTOMY Left 2016  . BREAST LUMPECTOMY WITH RADIOACTIVE SEED AND SENTINEL LYMPH NODE BIOPSY Left 12/22/2014   Procedure: BREAST LUMPECTOMY WITH RADIOACTIVE SEED AND SENTINEL LYMPH NODE BIOPSY;  Surgeon: Stark Klein, MD;  Location: [facility];  Service: General;  Laterality: Left;  . BUNIONECTOMY Right   . HAMMER TOE SURGERY Bilateral   . RHINOPLASTY    . SEPTOPLASTY      Family History  Problem Relation Age of Onset  . Diabetes Mother   . Diabetes Father   . Cancer Maternal Uncle        pancreatic cancer   . Diabetes Paternal Grandmother   . Diabetes Paternal Grandfather     Social  History   Tobacco Use  . Smoking status: Former Smoker    Packs/day: 0.00    Years: 0.00    Pack years: 0.00    Last attempt to quit: 05/01/1980    Years since quitting: 38.0  . Smokeless tobacco: Never Used  Substance Use Topics  . Alcohol use: Yes    Alcohol/week:  0.0 standard drinks    Comment: occasionally    Subjective:  Patient presents today as a new patient- notes that Medicare is requiring her to have PCP; has majority of care managed by Integrative [facility] provider- labs and medications are managed by this provider/ labs will be done in the next few weeks; sees Dr. Tamala Julian for management for sports medicine needs; sees breast surgeon q yearly for mammogram/ breast exam. Per patient, her breast surgeon, wants her to have breast exam and pap smear in the next 1-2 months. Chronic needs include: hypothyroidism, pre-diabetes, chronic insomnia, history of breast cancer, adrenal insufficiency;   Colonoscopy in 2017- Dr. Collene Mares- due in 2022; DEXA was done in 2019- osteopenia;   Will plan to get Shingrix at her pharmacy; defers pneumonia;      Objective:  Vitals:   05/15/18 0946  BP: 108/78  Pulse: 74  Temp: 98 F (36.7 C)  TempSrc: Oral  SpO2: 99%  Weight: 106 lb (48.1 kg)  Height: 5\' 3"  (1.6 m)    General: Well developed, well nourished, in no acute distress  Skin : Warm and dry.  Head: Normocephalic and atraumatic  Lungs: Respirations unlabored; clear to auscultation bilaterally without wheeze, rales, rhonchi  CVS exam: normal rate and regular rhythm.  Musculoskeletal: No deformities; no active joint inflammation  Extremities: No edema, cyanosis, clubbing  Vessels: Symmetric bilaterally  Neurologic: Alert and oriented; speech intact; face symmetrical; moves all extremities well; CNII-XII intact without focal deficit    Assessment:  1. Hypothyroidism, unspecified type   2. Insomnia, unspecified type   3. Adrenal insufficiency ([place])   4. Pre-diabetes   5. Allergic  rhinitis, unspecified seasonality, unspecified trigger   6. Arthritis of left hip   7. Osteopenia of hip, unspecified laterality     Plan:  Patient will continue to work with her Integrative Medicine provider for management of chronic care needs. She will come back here in 1 month to get her pap smear/ breast exam done since her Integrative Medicine provider does not perform pap smears; Patient will bring copies of labs for review; she will go to pharmacy for Shingrix; she defers pneumonia vaccines at this time.   Return in about 1 month (around 06/15/2018).  No orders of the defined types were placed in this encounter.   Requested Prescriptions    No prescriptions requested or ordered in this encounter

## 2018-05-16 ENCOUNTER — Encounter: Payer: Medicare Other | Admitting: Physical Therapy

## 2018-05-21 ENCOUNTER — Encounter: Payer: Self-pay | Admitting: Physical Therapy

## 2018-05-21 ENCOUNTER — Ambulatory Visit: Payer: PPO | Admitting: Physical Therapy

## 2018-05-21 ENCOUNTER — Encounter: Payer: Medicare Other | Admitting: Physical Therapy

## 2018-05-21 DIAGNOSIS — H04123 Dry eye syndrome of bilateral lacrimal glands: Secondary | ICD-10-CM | POA: Diagnosis not present

## 2018-05-21 DIAGNOSIS — M6281 Muscle weakness (generalized): Secondary | ICD-10-CM | POA: Diagnosis not present

## 2018-05-21 DIAGNOSIS — M25652 Stiffness of left hip, not elsewhere classified: Secondary | ICD-10-CM

## 2018-05-21 DIAGNOSIS — H0100A Unspecified blepharitis right eye, upper and lower eyelids: Secondary | ICD-10-CM | POA: Diagnosis not present

## 2018-05-21 DIAGNOSIS — M25552 Pain in left hip: Secondary | ICD-10-CM

## 2018-05-21 DIAGNOSIS — H0100B Unspecified blepharitis left eye, upper and lower eyelids: Secondary | ICD-10-CM | POA: Diagnosis not present

## 2018-05-21 DIAGNOSIS — M62838 Other muscle spasm: Secondary | ICD-10-CM

## 2018-05-21 NOTE — Therapy (Signed)
[facility] [address], [place], [place], [address] Phone: [phone]   Fax:  [phone]  Physical Therapy Treatment  Patient Details  Name: Christina [name] MRN: [id number] Date of Birth: February 13, 1951 Referring Provider (PT): Lyndal Pulley, DO   Encounter Date: 05/21/2018  PT End of Session - 05/21/18 1314    Visit Number  7    Number of Visits  10    Date for PT Re-Evaluation  06/17/18    Authorization Type  medicare    PT Start Time  1100    PT Stop Time  1144    PT Time Calculation (min)  44 min    Activity Tolerance  Patient tolerated treatment well    Behavior During Therapy  [facility] for tasks assessed/performed       Past Medical History:  Diagnosis Date  . Abrasion of right leg 12/17/2014  . Adrenal insufficiency ([place])   . Breast cancer of upper-outer quadrant of left female breast ([place]) 12/07/2014  . Cataract, immature    bilateral  . Dental bridge present    upper - x 2  . Dental crowns present   . Hypothyroidism   . Personal history of radiation therapy   . Prediabetes     Past Surgical History:  Procedure Laterality Date  . ABDOMINOPLASTY    . BALLOON SINUPLASTY    . BREAST LUMPECTOMY Left 2016  . BREAST LUMPECTOMY WITH RADIOACTIVE SEED AND SENTINEL LYMPH NODE BIOPSY Left 12/22/2014   Procedure: BREAST LUMPECTOMY WITH RADIOACTIVE SEED AND SENTINEL LYMPH NODE BIOPSY;  Surgeon: Stark Klein, MD;  Location: [place];  Service: General;  Laterality: Left;  . BUNIONECTOMY Right   . HAMMER TOE SURGERY Bilateral   . RHINOPLASTY    . SEPTOPLASTY      There were no vitals filed for this visit.  Subjective Assessment - 05/21/18 1116    Subjective  Pt states she is much better.  Pt states she has 90% less pain, but at night it wakes me up at 4:30am and need pain medicine.  She is just feeling fatigued more quickly.  Pt states she is up to 1 min and 15 sec on the eliptical     Patient Stated Goals  be able to stand on that leg    Currently in Pain?  No/denies                       [facility] Adult PT Treatment/Exercise - 05/21/18 0001      Knee/Hip Exercises: Stretches   Piriformis Stretch  Right;Left;30 seconds;3 reps   figure 4 and knee to shoulder   Other Knee/Hip Stretches  lower trunk rotation - 5 x 10 sec      Knee/Hip Exercises: Standing   Forward Step Up  Right;Left;3 sets;10 reps   BOSU ball   Walking with Sports Cord  back and side to side 15# x 3 laps; fwd 1 plate x 4 laps    Other Standing Knee Exercises  rocker board both ways - static balance and self induced pertubations - 1 min each way      Iontophoresis   Type of Iontophoresis  Dexamethasone    Location  Lt hip posterior at glutes attachment near SIJ    Dose  1.0 mL    Time  4-6 hour release   #4     tandem walking and [place] - 3 laps  PT Short Term Goals - 05/09/18 1105      PT SHORT TERM GOAL #1   Title  Pt will demo consistency and independence with her HEP to decrease pain and improve BLE strength.     Status  Achieved      PT SHORT TERM GOAL #2   Title  Report 25% less hip pain    Baseline  same on days when I do more, better on days when I don't do as much    Status  On-going        PT Long Term Goals - 05/14/18 1412      PT LONG TERM GOAL #1   Title  Pt will demo improved BLE hip flexion and abduction strength to 5/5 MMT which will increase her safety with daily activity.     Status  On-going      PT LONG TERM GOAL #2   Title  Pt will be able to mount and ride horse without increased hip pain due to improved LE strength    Baseline  able to ride, is very stiff and sore afterwards    Status  On-going      PT LONG TERM GOAL #3   Title  Pt will report atleast 75% improvement in her symptoms to allow for her to resume all prior activities without difficulty.     Status  On-going      PT LONG TERM GOAL #4   Title  FOTO < or = to 40%  limited    Status  On-going      PT LONG TERM GOAL #5   Title  ...            Plan - 05/21/18 1310    Clinical Impression Statement  Patient had no increased pain but was fatigued with exercises today.  pt did well with balance, more challenged with BOSU step ups and tandem walking.  Pt felt a good level of difficulty with walking with sports cord, needing supervision and cues initially to take smaller steps.  pt will continue to benefit from silledPT to work o improved stability and strength for return to maximum funcitonal activities and safely walking on uneven surfaces.    PT Treatment/Interventions  ADLs/Self Care Home Management;Biofeedback;Cryotherapy;Electrical Stimulation;Iontophoresis 4mg /ml Dexamethasone;Moist Heat;Ultrasound;Gait training;Stair training;Therapeutic activities;Therapeutic exercise;Neuromuscular re-education;Patient/family education;Manual techniques;Taping;Dry needling;Passive range of motion    PT Next Visit Plan  f/u on ionto #4, core and hip strengthening, quad trigger point release, hip stability    PT Home Exercise Plan   Access Code: [email or web address]     Consulted and Agree with Plan of Care  Patient       Patient will benefit from skilled therapeutic intervention in order to improve the following deficits and impairments:  Abnormal gait, Increased muscle spasms, Impaired tone, Decreased strength, Pain, Impaired flexibility, Increased fascial restricitons  Visit Diagnosis: Muscle weakness (generalized)  Other muscle spasm  Pain in left hip  Stiffness of left hip, not elsewhere classified     Problem List Patient Active Problem List   Diagnosis Date Noted  . Arthritis of left hip 03/20/2018  . Left hip pain 02/26/2018  . Greater trochanteric bursitis of right hip 06/13/2017  . 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 Breast cancer of upper-outer quadrant of left female breast ([place]) 12/07/2014  . Acute recurrent sinusitis 01/17/2013     Zannie Cove, PT 05/21/2018, 1:15 PM  [facility] [address],  [place], [place], [address] Phone: [phone]   Fax:  [phone]  Name: Christina [name] MRN: [id number] Date of Birth: Apr 05, 1951

## 2018-05-23 ENCOUNTER — Ambulatory Visit: Payer: PPO | Admitting: Physical Therapy

## 2018-05-23 ENCOUNTER — Encounter: Payer: Self-pay | Admitting: Physical Therapy

## 2018-05-23 DIAGNOSIS — M6281 Muscle weakness (generalized): Secondary | ICD-10-CM

## 2018-05-23 DIAGNOSIS — M62838 Other muscle spasm: Secondary | ICD-10-CM

## 2018-05-23 DIAGNOSIS — M25652 Stiffness of left hip, not elsewhere classified: Secondary | ICD-10-CM

## 2018-05-23 DIAGNOSIS — M25552 Pain in left hip: Secondary | ICD-10-CM

## 2018-05-23 NOTE — Therapy (Signed)
[facility] [address] [place], [place], [address] Phone: [phone]   Fax:  [phone]  Physical Therapy Treatment  Patient Details  Name: Christina [name] MRN: [id number] Date of Birth: 20-Feb-1951 Referring Provider (PT): Lyndal Pulley, DO   Encounter Date: 05/23/2018  PT End of Session - 05/23/18 1533    Visit Number  8    Number of Visits  10    Date for PT Re-Evaluation  06/17/18    Authorization Type  medicare    PT Start Time  1245    PT Stop Time  1610    PT Time Calculation (min)  40 min    Activity Tolerance  Patient tolerated treatment well    Behavior During Therapy  [facility] for tasks assessed/performed       Past Medical History:  Diagnosis Date  . Abrasion of right leg 12/17/2014  . Adrenal insufficiency ([place])   . Breast cancer of upper-outer quadrant of left female breast ([place]) 12/07/2014  . Cataract, immature    bilateral  . Dental bridge present    upper - x 2  . Dental crowns present   . Hypothyroidism   . Personal history of radiation therapy   . Prediabetes     Past Surgical History:  Procedure Laterality Date  . ABDOMINOPLASTY    . BALLOON SINUPLASTY    . BREAST LUMPECTOMY Left 2016  . BREAST LUMPECTOMY WITH RADIOACTIVE SEED AND SENTINEL LYMPH NODE BIOPSY Left 12/22/2014   Procedure: BREAST LUMPECTOMY WITH RADIOACTIVE SEED AND SENTINEL LYMPH NODE BIOPSY;  Surgeon: Stark Klein, MD;  Location: [place];  Service: General;  Laterality: Left;  . BUNIONECTOMY Right   . HAMMER TOE SURGERY Bilateral   . RHINOPLASTY    . SEPTOPLASTY      There were no vitals filed for this visit.  Subjective Assessment - 05/23/18 1609    Subjective  Pt feels much better and was able to do 75% back to normal gym workout.    Patient Stated Goals  be able to stand on that leg    Currently in Pain?  No/denies                       [facility] Adult PT Treatment/Exercise -  05/23/18 0001      Knee/Hip Exercises: Stretches   Active Hamstring Stretch  Right;Left;30 seconds;2 reps    Piriformis Stretch  Right;Left;30 seconds;3 reps   sitting     Knee/Hip Exercises: Aerobic   Nustep  L1x 3 min; L2 x 3 min   PT present for status update     Knee/Hip Exercises: Standing   SLS  on BOSU 2 x to fatigue     Rebounder  heel to toe, jogging, front to back rocking - 1 min each    Walking with Sports Cord  back 5x and side to side x3 20# laps; fwd 1 plate x 4 laps    Other Standing Knee Exercises  half foam balance standing with both feet on round and                PT Short Term Goals - 05/09/18 1105      PT SHORT TERM GOAL #1   Title  Pt will demo consistency and independence with her HEP to decrease pain and improve BLE strength.     Status  Achieved      PT SHORT TERM GOAL #2  Title  Report 25% less hip pain    Baseline  same on days when I do more, better on days when I don't do as much    Status  On-going        PT Long Term Goals - 05/23/18 1557      PT LONG TERM GOAL #1   Title  Pt will demo improved BLE hip flexion and abduction strength to 5/5 MMT which will increase her safety with daily activity.     Status  On-going      PT LONG TERM GOAL #2   Title  Pt will be able to mount and ride horse without increased hip pain due to improved LE strength    Baseline  back to riding modified but no pain      PT LONG TERM GOAL #3   Title  Pt will report atleast 75% improvement in her symptoms to allow for her to resume all prior activities without difficulty.     Baseline  90% improved, not quite back to horse riding    Status  On-going      PT LONG TERM GOAL #4   Title  FOTO < or = to 40% limited    Baseline  39%     Status  Achieved            Plan - 05/23/18 1727    Clinical Impression Statement  Pt did well today and met goal on FOTO with a 39% limitation down from 50%.  She was able to tolerate more exercises today.  She is  still fatigued but no increased pain, just muscle fatigue.  Pt continues to demonstrate LE weakness causing some instability in single leg activities and she needs close supervision.  Pt will benefit from skilled PTto continue addressing LE strength and stability so she can return to greater function in her active lifestyle.    PT Treatment/Interventions  ADLs/Self Care Home Management;Biofeedback;Cryotherapy;Electrical Stimulation;Iontophoresis 73m/ml Dexamethasone;Moist Heat;Ultrasound;Gait training;Stair training;Therapeutic activities;Therapeutic exercise;Neuromuscular re-education;Patient/family education;Manual techniques;Taping;Dry needling;Passive range of motion    PT Next Visit Plan  ionto #5, core and hip strengthening and single leg activities, lunges, walking with sports cord, hip and ankle stability    PT Home Exercise Plan   Access Code: [email or web address]     Consulted and Agree with Plan of Care  Patient       Patient will benefit from skilled therapeutic intervention in order to improve the following deficits and impairments:  Abnormal gait, Increased muscle spasms, Impaired tone, Decreased strength, Pain, Impaired flexibility, Increased fascial restricitons  Visit Diagnosis: Muscle weakness (generalized)  Other muscle spasm  Pain in left hip  Stiffness of left hip, not elsewhere classified     Problem List Patient Active Problem List   Diagnosis Date Noted  . Arthritis of left hip 03/20/2018  . Left hip pain 02/26/2018  . Greater trochanteric bursitis of right hip 06/13/2017  . 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 Breast cancer of upper-outer quadrant of left female breast ([place] 12/07/2014  . Acute recurrent sinusitis 01/17/2013    JZannie Cove PT 05/23/2018, 5:35 PM  [facility] [address] [place] [address]Phone: [phone]  Fax:  [phone] Name: VEMANII BUGBEEMRN: [id number] of Birth: 810-Aug-1952

## 2018-05-24 DIAGNOSIS — R7301 Impaired fasting glucose: Secondary | ICD-10-CM | POA: Diagnosis not present

## 2018-05-24 DIAGNOSIS — R946 Abnormal results of thyroid function studies: Secondary | ICD-10-CM | POA: Diagnosis not present

## 2018-05-24 DIAGNOSIS — Z170 Estrogen receptor positive status [ER+]: Secondary | ICD-10-CM | POA: Diagnosis not present

## 2018-05-24 DIAGNOSIS — E271 Primary adrenocortical insufficiency: Secondary | ICD-10-CM | POA: Diagnosis not present

## 2018-05-24 DIAGNOSIS — D6489 Other specified anemias: Secondary | ICD-10-CM | POA: Diagnosis not present

## 2018-05-24 DIAGNOSIS — M8580 Other specified disorders of bone density and structure, unspecified site: Secondary | ICD-10-CM | POA: Diagnosis not present

## 2018-05-24 DIAGNOSIS — E063 Autoimmune thyroiditis: Secondary | ICD-10-CM | POA: Diagnosis not present

## 2018-05-24 DIAGNOSIS — N951 Menopausal and female climacteric states: Secondary | ICD-10-CM | POA: Diagnosis not present

## 2018-05-24 DIAGNOSIS — E782 Mixed hyperlipidemia: Secondary | ICD-10-CM | POA: Diagnosis not present

## 2018-05-24 DIAGNOSIS — R5383 Other fatigue: Secondary | ICD-10-CM | POA: Diagnosis not present

## 2018-05-24 DIAGNOSIS — E039 Hypothyroidism, unspecified: Secondary | ICD-10-CM | POA: Diagnosis not present

## 2018-05-24 DIAGNOSIS — N210 Calculus in bladder: Secondary | ICD-10-CM | POA: Diagnosis not present

## 2018-05-27 ENCOUNTER — Ambulatory Visit: Payer: Medicare Other | Admitting: Family Medicine

## 2018-05-27 ENCOUNTER — Encounter: Payer: Self-pay | Admitting: Physical Therapy

## 2018-05-27 ENCOUNTER — Ambulatory Visit: Payer: PPO | Admitting: Physical Therapy

## 2018-05-27 DIAGNOSIS — M6281 Muscle weakness (generalized): Secondary | ICD-10-CM

## 2018-05-27 DIAGNOSIS — M25652 Stiffness of left hip, not elsewhere classified: Secondary | ICD-10-CM

## 2018-05-27 DIAGNOSIS — M25552 Pain in left hip: Secondary | ICD-10-CM

## 2018-05-27 DIAGNOSIS — M62838 Other muscle spasm: Secondary | ICD-10-CM

## 2018-05-27 NOTE — Therapy (Signed)
[facility] [address] [place], [place], [address] Phone: [phone]   Fax:  [phone]  Physical Therapy Treatment  Patient Details  Name: Christina [name] MRN: [id number] Date of Birth: 03-17-1951 Referring Provider (PT): Lyndal Pulley, DO   Encounter Date: 05/27/2018  PT End of Session - 05/27/18 1355    Visit Number  9    Number of Visits  10    Date for PT Re-Evaluation  06/17/18    Authorization Type  medicare    PT Start Time  1230    PT Stop Time  1311    PT Time Calculation (min)  41 min    Activity Tolerance  Patient tolerated treatment well    Behavior During Therapy  [facility] for tasks assessed/performed       Past Medical History:  Diagnosis Date  . Abrasion of right leg 12/17/2014  . Adrenal insufficiency ([place])   . Breast cancer of upper-outer quadrant of left female breast ([place]) 12/07/2014  . Cataract, immature    bilateral  . Dental bridge present    upper - x 2  . Dental crowns present   . Hypothyroidism   . Personal history of radiation therapy   . Prediabetes     Past Surgical History:  Procedure Laterality Date  . ABDOMINOPLASTY    . BALLOON SINUPLASTY    . BREAST LUMPECTOMY Left 2016  . BREAST LUMPECTOMY WITH RADIOACTIVE SEED AND SENTINEL LYMPH NODE BIOPSY Left 12/22/2014   Procedure: BREAST LUMPECTOMY WITH RADIOACTIVE SEED AND SENTINEL LYMPH NODE BIOPSY;  Surgeon: Stark Klein, MD;  Location: [place];  Service: General;  Laterality: Left;  . BUNIONECTOMY Right   . HAMMER TOE SURGERY Bilateral   . RHINOPLASTY    . SEPTOPLASTY      There were no vitals filed for this visit.  Subjective Assessment - 05/27/18 1234    Subjective  I was so sore after last session     Patient Stated Goals  be able to stand on that leg    Currently in Pain?  No/denies                       [facility] Adult PT Treatment/Exercise - 05/27/18 0001      Knee/Hip  Exercises: Stretches   Active Hamstring Stretch  Right;Left;30 seconds;2 reps    Hip Flexor Stretch  Right;Left;3 reps;30 seconds   quad and hip flexor stretch   Piriformis Stretch  Right;Left;30 seconds;3 reps   sitting     Knee/Hip Exercises: Standing   Walking with Sports Cord  15# 1x each way    Other Standing Knee Exercises  hip 3 ways standing on foam mat - 10x each      Knee/Hip Exercises: Seated   Clamshell with TheraBand  Green   30x   Other Seated Knee/Hip Exercises  LAQ and march on ball; circles on ball      Iontophoresis   Type of Iontophoresis  Dexamethasone    Location  Lt hip posterior at glutes attachment near SIJ    Dose  1.0 mL    Time  4-6 hour release   #5     Manual Therapy   Soft tissue mobilization  instrument assisted with addaday to bil glutes and hamstrings               PT Short Term Goals - 05/09/18 1105      PT SHORT  TERM GOAL #1   Title  Pt will demo consistency and independence with her HEP to decrease pain and improve BLE strength.     Status  Achieved      PT SHORT TERM GOAL #2   Title  Report 25% less hip pain    Baseline  same on days when I do more, better on days when I don't do as much    Status  On-going        PT Long Term Goals - 05/23/18 1557      PT LONG TERM GOAL #1   Title  Pt will demo improved BLE hip flexion and abduction strength to 5/5 MMT which will increase her safety with daily activity.     Status  On-going      PT LONG TERM GOAL #2   Title  Pt will be able to mount and ride horse without increased hip pain due to improved LE strength    Baseline  back to riding modified but no pain      PT LONG TERM GOAL #3   Title  Pt will report atleast 75% improvement in her symptoms to allow for her to resume all prior activities without difficulty.     Baseline  90% improved, not quite back to horse riding    Status  On-going      PT LONG TERM GOAL #4   Title  FOTO < or = to 40% limited    Baseline  39%      Status  Achieved            Plan - 05/27/18 1413    Clinical Impression Statement  Pt was more sore today since last session so she wasn't able to do as much.  Pt tolerated exercises well today and continues to need cues to keep pelvis neutral.  She will benefit fromskilled PT to progress strength so she can return to functional acitvities like riding her horse.    PT Treatment/Interventions  ADLs/Self Care Home Management;Biofeedback;Cryotherapy;Electrical Stimulation;Iontophoresis 4mg /ml Dexamethasone;Moist Heat;Ultrasound;Gait training;Stair training;Therapeutic activities;Therapeutic exercise;Neuromuscular re-education;Patient/family education;Manual techniques;Taping;Dry needling;Passive range of motion    PT Next Visit Plan  ionto #6, core and hip strengthening and single leg activities, lunges, walking with sports cord, hip and ankle stability    PT Home Exercise Plan   Access Code: [email or web address]     Consulted and Agree with Plan of Care  Patient       Patient will benefit from skilled therapeutic intervention in order to improve the following deficits and impairments:  Abnormal gait, Increased muscle spasms, Impaired tone, Decreased strength, Pain, Impaired flexibility, Increased fascial restricitons  Visit Diagnosis: Muscle weakness (generalized)  Other muscle spasm  Pain in left hip  Stiffness of left hip, not elsewhere classified     Problem List Patient Active Problem List   Diagnosis Date Noted  . Arthritis of left hip 03/20/2018  . Left hip pain 02/26/2018  . Greater trochanteric bursitis of right hip 06/13/2017  . 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 Breast cancer of upper-outer quadrant of left female breast ([place]) 12/07/2014  . Acute recurrent sinusitis 01/17/2013    Zannie Cove, PT 05/27/2018, 2:42 PM  [facility] [address] [place], [place], [address] Phone: [phone]    Fax:  [phone]  Name: Christina [name] MRN: [id number] Date of Birth: 30-Aug-1950

## 2018-05-30 ENCOUNTER — Ambulatory Visit: Payer: PPO | Admitting: Physical Therapy

## 2018-05-30 DIAGNOSIS — M62838 Other muscle spasm: Secondary | ICD-10-CM

## 2018-05-30 DIAGNOSIS — M25652 Stiffness of left hip, not elsewhere classified: Secondary | ICD-10-CM

## 2018-05-30 DIAGNOSIS — M25552 Pain in left hip: Secondary | ICD-10-CM

## 2018-05-30 DIAGNOSIS — M6281 Muscle weakness (generalized): Secondary | ICD-10-CM | POA: Diagnosis not present

## 2018-05-31 NOTE — Therapy (Addendum)
[facility] [address], [place], [place], [address] Phone: [phone]   Fax:  [phone]  Physical Therapy Treatment Progress Note Reporting Period 04/22/18 to 05/30/2018  See note below for Objective Data and Assessment of Progress/Goals.      Patient Details  Name: Christina [name] MRN: [id number] Date of Birth: 08-25-1950 Referring Provider (PT): Lyndal Pulley, DO   Encounter Date: 05/30/2018  PT End of Session - 05/30/18 1109    Visit Number  10    Number of Visits  10    Date for PT Re-Evaluation  06/17/18    Authorization Type  medicare    PT Start Time  1101    PT Stop Time  1145    PT Time Calculation (min)  44 min       Past Medical History:  Diagnosis Date  . Abrasion of right leg 12/17/2014  . Adrenal insufficiency ([place])   . Breast cancer of upper-outer quadrant of left female breast ([place]) 12/07/2014  . Cataract, immature    bilateral  . Dental bridge present    upper - x 2  . Dental crowns present   . Hypothyroidism   . Personal history of radiation therapy   . Prediabetes     Past Surgical History:  Procedure Laterality Date  . ABDOMINOPLASTY    . BALLOON SINUPLASTY    . BREAST LUMPECTOMY Left 2016  . BREAST LUMPECTOMY WITH RADIOACTIVE SEED AND SENTINEL LYMPH NODE BIOPSY Left 12/22/2014   Procedure: BREAST LUMPECTOMY WITH RADIOACTIVE SEED AND SENTINEL LYMPH NODE BIOPSY;  Surgeon: Stark Klein, MD;  Location: [place];  Service: General;  Laterality: Left;  . BUNIONECTOMY Right   . HAMMER TOE SURGERY Bilateral   . RHINOPLASTY    . SEPTOPLASTY      There were no vitals filed for this visit.      [facility] PT Assessment - 05/31/18 0001      Assessment   Medical Diagnosis  M16.12 (ICD-10-CM) - Arthritis of left hip    Referring Provider (PT)  Lyndal Pulley, DO      Strength   Overall Strength Comments  4/5 left hip flexion, abduction; 5/5 left hip adduction                    [facility] Adult PT Treatment/Exercise - 05/31/18 0001      Knee/Hip Exercises: Machines for Strengthening   Cybex Leg Press  Seat 6 BLE 60lb      Knee/Hip Exercises: Standing   Walking with Sports Cord  15# 1x each way    Other Standing Knee Exercises  standing on half foam both ways      Knee/Hip Exercises: Supine   Other Supine Knee/Hip Exercises  hip seriers 3 ways - 2 lb - 15x each side      Iontophoresis   Type of Iontophoresis  Dexamethasone    Location  Lt hip posterior at glutes attachment near SIJ    Dose  1.0 mL    Time  4-6 hour release   #6              PT Short Term Goals - 05/09/18 1105      PT SHORT TERM GOAL #1   Title  Pt will demo consistency and independence with her HEP to decrease pain and improve BLE strength.     Status  Achieved      PT SHORT TERM GOAL #2  Title  Report 25% less hip pain    Baseline  same on days when I do more, better on days when I don't do as much    Status  On-going        PT Long Term Goals - 05/30/18 1110      PT LONG TERM GOAL #1   Title  Pt will demo improved BLE hip flexion and abduction strength to 5/5 MMT which will increase her safety with daily activity.     Baseline  still learning    Status  On-going      PT LONG TERM GOAL #2   Title  Pt will be able to mount and ride horse without increased hip pain due to improved LE strength    Baseline  pt reports she can do this    Status  Achieved      PT LONG TERM GOAL #3   Title  Pt will report atleast 75% improvement in her symptoms to allow for her to resume all prior activities without difficulty.     Baseline  90% improved, but riding the horse but there is a lot of pain/fatigue in the muscles and I have poor muscle recovery    Status  Partially Met      PT LONG TERM GOAL #4   Title  FOTO < or = to 40% limited    Baseline  39%     Status  Achieved            Plan - 05/31/18 1250    Clinical Impression Statement   Patient has made good progress since evaluation.  She has been having some setbacks due to being easily overworked and has to progress at a slower rate.  Pt is doing well with addition of balance activities and will benefit from skilled PT to continue addressing strength and balance.  She has very active lifestyle and needs to walk on a lot of uneven ground.    PT Treatment/Interventions  ADLs/Self Care Home Management;Biofeedback;Cryotherapy;Electrical Stimulation;Iontophoresis 22m/ml Dexamethasone;Moist Heat;Ultrasound;Gait training;Stair training;Therapeutic activities;Therapeutic exercise;Neuromuscular re-education;Patient/family education;Manual techniques;Taping;Dry needling;Passive range of motion    PT Next Visit Plan  core and hip strengthening and single leg activities, lunges, walking with sports cord, hip and ankle stability    PT Home Exercise Plan   Access Code: [email or web address]     Consulted and Agree with Plan of Care  Patient       Patient will benefit from skilled therapeutic intervention in order to improve the following deficits and impairments:  Abnormal gait, Increased muscle spasms, Impaired tone, Decreased strength, Pain, Impaired flexibility, Increased fascial restricitons  Visit Diagnosis: Muscle weakness (generalized)  Other muscle spasm  Pain in left hip  Stiffness of left hip, not elsewhere classified     Problem List Patient Active Problem List   Diagnosis Date Noted  . Arthritis of left hip 03/20/2018  . Left hip pain 02/26/2018  . Greater trochanteric bursitis of right hip 06/13/2017  . 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 [place] 12/07/2014  . Acute recurrent sinusitis 01/17/2013    JZannie Cove PT 05/31/2018, 1:00 PM  [facility] [address] [place] [address]Phone: [phone]  Fax:   [phone] Name: Christina JAGGERSMRN: [id number] of Birth: [birth year]

## 2018-06-03 ENCOUNTER — Ambulatory Visit: Payer: PPO | Attending: Family Medicine

## 2018-06-03 DIAGNOSIS — M25652 Stiffness of left hip, not elsewhere classified: Secondary | ICD-10-CM

## 2018-06-03 DIAGNOSIS — M25552 Pain in left hip: Secondary | ICD-10-CM | POA: Insufficient documentation

## 2018-06-03 DIAGNOSIS — M62838 Other muscle spasm: Secondary | ICD-10-CM | POA: Diagnosis not present

## 2018-06-03 DIAGNOSIS — M6281 Muscle weakness (generalized): Secondary | ICD-10-CM | POA: Insufficient documentation

## 2018-06-03 NOTE — Therapy (Addendum)
[facility] [address], [place], [place], [address] Phone: [phone]   Fax:  [phone]  Physical Therapy Treatment  Patient Details  Name: Christina [name] MRN: [id number] Date of Birth: 18-Feb-1951 Referring Provider (PT): Lyndal Pulley, DO   Encounter Date: 06/03/2018  PT End of Session - 06/03/18 1605    Visit Number  11    Date for PT Re-Evaluation  06/17/18    Authorization Type  medicare    PT Start Time  7544    PT Stop Time  1605    PT Time Calculation (min)  34 min    Activity Tolerance  Patient tolerated treatment well    Behavior During Therapy  [facility] for tasks assessed/performed       Past Medical History:  Diagnosis Date  . Abrasion of right leg 12/17/2014  . Adrenal insufficiency ([place])   . Breast cancer of upper-outer quadrant of left female breast ([place]) 12/07/2014  . Cataract, immature    bilateral  . Dental bridge present    upper - x 2  . Dental crowns present   . Hypothyroidism   . Personal history of radiation therapy   . Prediabetes     Past Surgical History:  Procedure Laterality Date  . ABDOMINOPLASTY    . BALLOON SINUPLASTY    . BREAST LUMPECTOMY Left 2016  . BREAST LUMPECTOMY WITH RADIOACTIVE SEED AND SENTINEL LYMPH NODE BIOPSY Left 12/22/2014   Procedure: BREAST LUMPECTOMY WITH RADIOACTIVE SEED AND SENTINEL LYMPH NODE BIOPSY;  Surgeon: Stark Klein, MD;  Location: [place];  Service: General;  Laterality: Left;  . BUNIONECTOMY Right   . HAMMER TOE SURGERY Bilateral   . RHINOPLASTY    . SEPTOPLASTY      There were no vitals filed for this visit.  Subjective Assessment - 06/03/18 1537    Subjective  I don't want to do too much because I am going to ride my horse tomorrow.      Currently in Pain?  No/denies   Pain/stiffness after last session  6/10                      [facility] Adult PT Treatment/Exercise - 06/03/18 0001      Exercises   Exercises  Shoulder      Knee/Hip Exercises: Stretches   Active Hamstring Stretch  Right;Left;30 seconds;2 reps    Hip Flexor Stretch  Right;Left;3 reps;30 seconds    Piriformis Stretch  Right;Left;30 seconds;3 reps      Knee/Hip Exercises: Aerobic   Stationary Bike  Level 1x 1 minute    Nustep  Level 1 x 2.5 minutes      Knee/Hip Exercises: Standing   Other Standing Knee Exercises  standing on half foam both ways      Knee/Hip Exercises: Supine   Hip Adduction Isometric  Strengthening;Right;Left;2 sets;10 reps    Bridges with Greig Right  Strengthening;Both;2 sets;10 reps    Straight Leg Raises  Strengthening;Both;2 sets;5 reps      Knee/Hip Exercises: Sidelying   Hip ABduction  Strengthening;Both;2 sets;5 sets      Shoulder Exercises: Standing   Extension  Strengthening;Both;20 reps;Theraband   with core activation   Theraband Level (Shoulder Extension)  Level 2 (Red)               PT Short Term Goals - 05/09/18 1105      PT SHORT TERM GOAL #1   Title  Pt will demo consistency and independence with her HEP to decrease pain and improve BLE strength.     Status  Achieved      PT SHORT TERM GOAL #2   Title  Report 25% less hip pain    Baseline  same on days when I do more, better on days when I don't do as much    Status  On-going        PT Long Term Goals - 05/30/18 1110      PT LONG TERM GOAL #1   Title  Pt will demo improved BLE hip flexion and abduction strength to 5/5 MMT which will increase her safety with daily activity.     Baseline  still learning    Status  On-going      PT LONG TERM GOAL #2   Title  Pt will be able to mount and ride horse without increased hip pain due to improved LE strength    Baseline  pt reports she can do this    Status  Achieved      PT LONG TERM GOAL #3   Title  Pt will report atleast 75% improvement in her symptoms to allow for her to resume all prior activities without difficulty.     Baseline  90% improved, but  riding the horse but there is a lot of pain/fatigue in the muscles and I have poor muscle recovery    Status  Partially Met      PT LONG TERM GOAL #4   Title  FOTO < or = to 40% limited    Baseline  39%     Status  Achieved            Plan - 06/03/18 1604    Clinical Impression Statement  Pt requested to reduce intensity of exercise today as she was sore after last session.  Pt tolerated all exercise today without increase in pain.  Pt demonstrates weakness in bil hips with supine hip exercises and requires verbal cues for speed of movement and isolation of muscles.  PT provided tactile and verbal cues for core activation.  Session ended early per pt request.  Pt will continue to benefit from skilled PT for core and hip strength and flexibility.      Rehab Potential  Excellent    PT Frequency  2x / week    PT Duration  8 weeks    PT Treatment/Interventions  ADLs/Self Care Home Management;Biofeedback;Cryotherapy;Electrical Stimulation;Iontophoresis 39m/ml Dexamethasone;Moist Heat;Ultrasound;Gait training;Stair training;Therapeutic activities;Therapeutic exercise;Neuromuscular re-education;Patient/family education;Manual techniques;Taping;Dry needling;Passive range of motion    PT Next Visit Plan  advance exercise as tolerated, see how pt felt after today's session.      PT Home Exercise Plan   Access Code: [email or web address]    Consulted and Agree with Plan of Care  Patient       Patient will benefit from skilled therapeutic intervention in order to improve the following deficits and impairments:  Abnormal gait, Increased muscle spasms, Impaired tone, Decreased strength, Pain, Impaired flexibility, Increased fascial restricitons  Visit Diagnosis: Other muscle spasm  Muscle weakness (generalized)  Pain in left hip  Stiffness of left hip, not elsewhere classified     Problem List Patient Active Problem List   Diagnosis Date Noted  . Arthritis of left hip 03/20/2018  . Left hip pain  02/26/2018  . Greater trochanteric bursitis of right hip 06/13/2017  . 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 Breast cancer of  upper-outer quadrant of left female breast ([place]) 12/07/2014  . Acute recurrent sinusitis 01/17/2013    Sigurd Sos, PT 06/03/18 4:12 PM PHYSICAL THERAPY DISCHARGE SUMMARY  Visits from Start of Care: 11   Current functional level related to goals / functional outcomes: See above for current status.  Pt was placed on hold and referred to a specialist.     Remaining deficits: See above.   Education / Equipment: HEP Plan: Patient agrees to discharge.  Patient goals were partially met. Patient is being discharged due to the physician's request.  ?????        Sigurd Sos, PT 06/27/18 12:53 PM  [facility] [address] [place], [place], [address] Phone: [phone]   Fax:  [phone]  Name: Christina [name] MRN: [id number] Date of Birth: Apr 03, 1951

## 2018-06-04 NOTE — Progress Notes (Signed)
[name] [place], [place] [address] Phone: [phone] Subjective:    I'm seeing this patient by the request  of:     I Christina [name] am serving as a scribe for Dr. Hulan Saas.  CC:   [id number]    04/22/2018  Discussed with patient in great length.  Some minimal improvement with the injection.  I do believe that patient has other problems as well including the labral pathology, gluteal tendon as well.  Encourage patient to start with formal physical therapy and referral placed again today.  Patient will hold on any type of surgical intervention.  Would do well with that possible replacement and I think patient will need a replacement before she would consider the possibility of the labral tear secondary to the amount of arthritis.  Continue same medications.  Follow-up with me again in 4 weeks.  Spent  25 minutes with patient face-to-face and had greater than 50% of counseling including as described above in assessment and plan.  Updated 06/05/2018   Christina [name] is a 68 y.o. female coming in with complaint of left hip pain. Wants a recheck on everything and wants to know what she should do about therapy.  Patient feels like she is not improving and if anything seems to be getting worse.    Patient is an MRI of the hip done in November.  Found to have an acute distal gluteus medius tendon tear as well as the anterior superior acetabular labral tear of the left hip and moderate hip arthritis.  We attempted a hip injection March 23, 2018.  Helped some pain but did not help the rest of it.  Started in formal physical therapy as well.  Past Medical History:  Diagnosis Date  . Abrasion of right leg 12/17/2014  . Adrenal insufficiency ([place])   . Breast cancer of upper-outer quadrant of left female breast ([place]) 12/07/2014  . Cataract, immature    bilateral  . Dental bridge present    upper - x 2  . Dental crowns present   .  Hypothyroidism   . Personal history of radiation therapy   . Prediabetes    Past Surgical History:  Procedure Laterality Date  . ABDOMINOPLASTY    . BALLOON SINUPLASTY    . BREAST LUMPECTOMY Left 2016  . BREAST LUMPECTOMY WITH RADIOACTIVE SEED AND SENTINEL LYMPH NODE BIOPSY Left 12/22/2014   Procedure: BREAST LUMPECTOMY WITH RADIOACTIVE SEED AND SENTINEL LYMPH NODE BIOPSY;  Surgeon: Stark Klein, MD;  Location: [place];  Service: General;  Laterality: Left;  . BUNIONECTOMY Right   . HAMMER TOE SURGERY Bilateral   . RHINOPLASTY    . SEPTOPLASTY     Social History   Socioeconomic History  . Marital status: Widowed    Spouse name: Not on file  . Number of children: Not on file  . Years of education: Not on file  . Highest education level: Not on file  Occupational History    Employer: [employer]  . Financial resource strain: Not on file  . Food insecurity:    Worry: Not on file    Inability: Not on file  . Transportation needs:    Medical: Not on file    Non-medical: Not on file  Tobacco Use  . Smoking status: Former Smoker    Packs/day: 0.00    Years: 0.00    Pack years: 0.00    Last attempt to  quit: 05/01/1980    Years since quitting: 38.1  . Smokeless tobacco: Never Used  Substance and Sexual Activity  . Alcohol use: Yes    Alcohol/week: 0.0 standard drinks    Comment: occasionally  . Drug use: No  . Sexual activity: Not on file  Lifestyle  . Physical activity:    Days per week: Not on file    Minutes per session: Not on file  . Stress: Not on file  Relationships  . Social connections:    Talks on phone: Not on file    Gets together: Not on file    Attends religious service: Not on file    Active member of club or organization: Not on file    Attends meetings of clubs or organizations: Not on file    Relationship status: Not on file  Other Topics Concern  . Not on file  Social History Narrative  . Not on file    Allergies  Allergen Reactions  . Celery Oil Other (See Comments)    FEVER BLISTERS  . Doxycycline Nausea And Vomiting  . Rye Grass Flower Pollen Extract [Gramineae Pollens] Swelling    RYE:  SWELLING THROAT - DENIES SOB  . Sulfa Antibiotics Swelling    NOSE  . Tea Swelling    THROAT - DENIES SOB  . Adhesive [Tape] Other (See Comments)    TEARS SKIN   Family History  Problem Relation Age of Onset  . Diabetes Mother   . Diabetes Father   . Cancer Maternal Uncle        pancreatic cancer   . Diabetes Paternal Grandmother   . Diabetes Paternal Grandfather     Current Outpatient Medications (Endocrine & Metabolic):  .  liothyronine (CYTOMEL) 25 MCG tablet, Take 50 mcg by mouth daily. .  metFORMIN (GLUCOPHAGE-XR) 500 MG 24 hr tablet, Take 1,000 mg by mouth 2 (two) times daily. Takes 1000mg  in the morning, 500mg  at lunch, and 500mg  with supper .  methylPREDNISolone (MEDROL) 4 MG tablet, Take 2.5-4 mg by mouth 3 (three) times daily. Takes 4mg  in the morning and 2.5mg  midday and at bedtime  Current Facility-Administered Medications (Endocrine & Metabolic):  .  methylPREDNISolone acetate (DEPO-MEDROL) injection 80 mg    Current Outpatient Medications (Respiratory):  .  albuterol (PROVENTIL HFA;VENTOLIN HFA) 108 (90 BASE) MCG/ACT inhaler, Inhale into the lungs every 6 (six) hours as needed for wheezing or shortness of breath. .  cetirizine (ZYRTEC) 10 MG tablet, Take 10 mg by mouth daily.  .  fluticasone (FLONASE) 50 MCG/ACT nasal spray, Place 1 spray into both nostrils daily. Marland Kitchen  guaiFENesin (MUCINEX) 600 MG 12 hr tablet, Take 600 mg by mouth daily.  .  montelukast (SINGULAIR) 10 MG tablet, Take 10 mg by mouth at bedtime.   Current Outpatient Medications (Analgesics):  .  ibuprofen (ADVIL,MOTRIN) 200 MG tablet, Take 600 mg by mouth every 4 (four) hours as needed for fever, headache, mild pain, moderate pain or cramping. Marland Kitchen  oxyCODONE-acetaminophen (ROXICET) 5-325 MG per tablet,  Take 1-2 tablets by mouth every 4 (four) hours as needed for severe pain. (Patient taking differently: Take 0.25-1 tablets by mouth every 4 (four) hours as needed for severe pain. ) .  traMADol (ULTRAM) 50 MG tablet, Take 1 tablet (50 mg total) by mouth every 6 (six) hours as needed.     Current Outpatient Medications (Other):  Marland Kitchen  Alpha Lipoic Acid 200 MG CAPS, Take 600 mg by mouth 2 (two) times daily.  Marland Kitchen  Ascorbic Acid (VITAMIN C) 1000 MG tablet, Take 2,000-3,000 mg by mouth 2 (two) times daily. Takes 2000mg  in the morning and 3000mg  at night .  calcium carbonate (OS-CAL) 600 MG TABS tablet, Take 800 mg by mouth 2 (two) times daily with a meal. .  clonazePAM (KLONOPIN) 1 MG tablet, Take 1 mg by mouth at bedtime.  .  Coenzyme Q10 200 MG capsule, Take 200 mg by mouth daily. .  cyclobenzaprine (FLEXERIL) 10 MG tablet, Take 1 tablet (10 mg total) by mouth 3 (three) times daily as needed for muscle spasms. .  Eszopiclone (ESZOPICLONE) 3 MG TABS, Take 0.5 mg by mouth at bedtime. Take immediately before bedtime  .  Flaxseed, Linseed, (FLAXSEED OIL) 1000 MG CAPS, Take 1,000 mg by mouth daily.  Marland Kitchen  MAGNESIUM CITRATE PO*, Take 2 tablets by mouth daily. Marland Kitchen  MAGNESIUM LACTATE PO, Take 2 tablets by mouth at bedtime. .  niacin 500 MG tablet, Take 500 mg by mouth daily.  .  Nutritional Supplements (SYTRINOL PO), Take 1 tablet by mouth daily.  .  ondansetron (ZOFRAN ODT) 4 MG disintegrating tablet, Take 1 tablet (4 mg total) by mouth every 8 (eight) hours as needed for nausea or vomiting. Marland Kitchen  OVER THE COUNTER MEDICATION, Take 1-2 tablets by mouth 2 (two) times daily. *Nutrient 950 with NAC2* takes 2 tablets in the morning and 1 tablet at lunchtime .  OVER THE COUNTER MEDICATION, Take 2 tablets by mouth daily with breakfast. *LV GB Complex* .  OVER THE COUNTER MEDICATION, Take 1 tablet by mouth 2 (two) times daily. *Regenemax or Biosil* .  OVER THE COUNTER MEDICATION, Take 30 mg by mouth daily. *Borotab* .   OVER THE COUNTER MEDICATION, Take 1 capsule by mouth daily. *Lypozyme* .  potassium chloride (MICRO-K) 10 MEQ CR capsule, Take 2 tabs by mouth twice daily. .  STRONTIUM GLUCONATE-B6-B12-FA PO, Take 2 tablets by mouth daily.  .  Vitamin D, Ergocalciferol, (DRISDOL) 50000 units CAPS capsule, Take 50,000 Units by mouth every Sunday. Marland Kitchen  VITAMIN K, PHYTONADIONE, PO, Take 2 tablets by mouth daily.  Marland Kitchen  zinc gluconate 50 MG tablet, Take 50-100 mg by mouth 2 (two) times daily. Takes 100mg  in the morning and 50mg  later in the day  * These medications belong to multiple therapeutic classes and are listed under each applicable group.    Past medical history, social, surgical and family history all reviewed in electronic medical record.  No pertanent information unless stated regarding to the chief complaint.   Review of Systems:  No headache, visual changes, nausea, vomiting, diarrhea, constipation, dizziness, abdominal pain, skin rash, fevers, chills, night sweats, weight loss, swollen lymph nodes, body aches, joint swelling,  chest pain, shortness of breath, mood changes.  Positive muscle aches  Objective  Blood pressure (!) 150/90, pulse 86, height 5\' 3"  (1.6 m), weight 107 lb (48.5 kg), SpO2 97 %.    General: No apparent distress alert and oriented x3 mood and affect normal, dressed appropriately.  HEENT: Pupils equal, extraocular movements intact  Respiratory: Patient's speak in full sentences and does not appear short of breath  Cardiovascular: No lower extremity edema, non tender, no erythema  Skin: Warm dry intact with no signs of infection or rash on extremities or on axial skeleton.  Abdomen: Soft nontender  Neuro: Cranial nerves II through XII are intact, neurovascularly intact in all extremities with 2+ DTRs and 2+ pulses.  Lymph: No lymphadenopathy of posterior or anterior cervical chain or axillae bilaterally.  Gait antalgic.  MSK:  tender with full range of motion and good stability  and symmetric strength and tone of shoulders, elbows, wrist,  knee and ankles bilaterally.  Patient is minorly cachectic Hip: Left hip shows the patient does have an external rotation at baseline.  Patient has decreasing in internal rotation from previous exam with only 5 to 10 degrees at the moment.  Increasing pain in the groin area.  Patient actually has some mild improvement on the strength of the gluteal area and less tenderness as well.  Negative straight leg test.    Impression and Recommendations:     This case required medical decision making of moderate complexity. The above documentation has been reviewed and is accurate and complete Lyndal Pulley, DO       Note: This dictation was prepared with Dragon dictation along with smaller phrase technology. Any transcriptional errors that result from this process are unintentional.

## 2018-06-05 ENCOUNTER — Ambulatory Visit (INDEPENDENT_AMBULATORY_CARE_PROVIDER_SITE_OTHER): Payer: PPO | Admitting: Family Medicine

## 2018-06-05 ENCOUNTER — Encounter: Payer: Self-pay | Admitting: Family Medicine

## 2018-06-05 VITALS — BP 150/90 | HR 86 | Ht 63.0 in | Wt 107.0 lb

## 2018-06-05 DIAGNOSIS — M1612 Unilateral primary osteoarthritis, left hip: Secondary | ICD-10-CM

## 2018-06-05 NOTE — Patient Instructions (Signed)
Good to see you  Christina [name] is your friend Stay active We will get you in with Aretha Parrot to see if he can do something with the labral or his recommendation  Send me a message after meeting with him and then if going to [place] I will give you prednisone for the trip

## 2018-06-05 NOTE — Assessment & Plan Note (Signed)
Discussed with patient.  Labral tear noted as well  Failed all conservative therapy  Referred to Aretha Parrot to discuss possible labral vs replacement surgery.  In agreement with plan

## 2018-06-06 ENCOUNTER — Ambulatory Visit: Payer: PPO | Admitting: Physical Therapy

## 2018-06-10 ENCOUNTER — Encounter: Payer: PPO | Admitting: Physical Therapy

## 2018-06-13 ENCOUNTER — Ambulatory Visit: Payer: Self-pay | Admitting: Physical Therapy

## 2018-06-18 ENCOUNTER — Ambulatory Visit: Payer: PPO | Admitting: Family

## 2018-06-21 ENCOUNTER — Encounter: Payer: Self-pay | Admitting: Family

## 2018-06-21 ENCOUNTER — Other Ambulatory Visit (HOSPITAL_COMMUNITY)
Admission: RE | Admit: 2018-06-21 | Discharge: 2018-06-21 | Disposition: A | Discharge status: Home / Self Care | Payer: PPO | Source: Ambulatory Visit | Attending: Family | Admitting: Family

## 2018-06-21 ENCOUNTER — Encounter: Payer: PPO | Admitting: Family

## 2018-06-21 ENCOUNTER — Other Ambulatory Visit: Payer: PPO

## 2018-06-21 ENCOUNTER — Ambulatory Visit (INDEPENDENT_AMBULATORY_CARE_PROVIDER_SITE_OTHER): Payer: PPO | Admitting: Family

## 2018-06-21 VITALS — BP 138/80 | HR 74 | Temp 97.5°F | Ht 63.0 in | Wt 109.1 lb

## 2018-06-21 DIAGNOSIS — Z124 Encounter for screening for malignant neoplasm of cervix: Secondary | ICD-10-CM

## 2018-06-21 DIAGNOSIS — Z853 Personal history of malignant neoplasm of breast: Secondary | ICD-10-CM | POA: Diagnosis not present

## 2018-06-21 NOTE — Progress Notes (Addendum)
Christina [name] is a 68 y.o. female with the following history as recorded in [employer]:  Patient Active Problem List   Diagnosis Date Noted  . Arthritis of left hip 03/20/2018  . Left hip pain 02/26/2018  . Greater trochanteric bursitis of right hip 06/13/2017  . 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 Breast cancer of upper-outer quadrant of left female breast ([place]) 12/07/2014  . Acute recurrent sinusitis 01/17/2013    Current Outpatient Medications  Medication Sig Dispense Refill  . albuterol (PROVENTIL HFA;VENTOLIN HFA) 108 (90 BASE) MCG/ACT inhaler Inhale into the lungs every 6 (six) hours as needed for wheezing or shortness of breath.    Ilean Skill Lipoic Acid 200 MG CAPS Take 600 mg by mouth 2 (two) times daily.     . Ascorbic Acid (VITAMIN C) 1000 MG tablet Take 2,000-3,000 mg by mouth 2 (two) times daily. Takes 2000mg  in the morning and 3000mg  at night    . calcium carbonate (OS-CAL) 600 MG TABS tablet Take 800 mg by mouth 2 (two) times daily with a meal.    . cetirizine (ZYRTEC) 10 MG tablet Take 10 mg by mouth daily.     . clonazePAM (KLONOPIN) 1 MG tablet Take 1 mg by mouth at bedtime.     . Coenzyme Q10 200 MG capsule Take 200 mg by mouth daily.    . cyclobenzaprine (FLEXERIL) 10 MG tablet Take 1 tablet (10 mg total) by mouth 3 (three) times daily as needed for muscle spasms. 90 tablet 0  . Eszopiclone (ESZOPICLONE) 3 MG TABS Take 0.5 mg by mouth at bedtime. Take immediately before bedtime     . Flaxseed, Linseed, (FLAXSEED OIL) 1000 MG CAPS Take 1,000 mg by mouth daily.     . fluticasone (FLONASE) 50 MCG/ACT nasal spray Place 1 spray into both nostrils daily.  11  . guaiFENesin (MUCINEX) 600 MG 12 hr tablet Take 600 mg by mouth daily.     Marland Kitchen ibuprofen (ADVIL,MOTRIN) 200 MG tablet Take 600 mg by mouth every 4 (four) hours as needed for fever, headache, mild pain, moderate pain or cramping.    Marland Kitchen liothyronine (CYTOMEL) 25 MCG tablet Take 50 mcg by mouth daily.    Marland Kitchen  MAGNESIUM CITRATE PO Take 2 tablets by mouth daily.    Marland Kitchen MAGNESIUM LACTATE PO Take 2 tablets by mouth at bedtime.    . metFORMIN (GLUCOPHAGE-XR) 500 MG 24 hr tablet Take 1,000 mg by mouth 2 (two) times daily. Takes 1000mg  in the morning, 500mg  at lunch, and 500mg  with supper  1  . methylPREDNISolone (MEDROL) 4 MG tablet Take 2.5-4 mg by mouth 3 (three) times daily. Takes 4mg  in the morning and 2.5mg  midday and at bedtime    . montelukast (SINGULAIR) 10 MG tablet Take 10 mg by mouth at bedtime.    . niacin 500 MG tablet Take 500 mg by mouth daily.     . Nutritional Supplements (SYTRINOL PO) Take 1 tablet by mouth daily.     . ondansetron (ZOFRAN ODT) 4 MG disintegrating tablet Take 1 tablet (4 mg total) by mouth every 8 (eight) hours as needed for nausea or vomiting. 12 tablet 0  . OVER THE COUNTER MEDICATION Take 1-2 tablets by mouth 2 (two) times daily. *Nutrient 950 with NAC2* takes 2 tablets in the morning and 1 tablet at lunchtime    . OVER THE COUNTER MEDICATION Take 2 tablets by mouth daily with breakfast. *LV GB Complex*    .  OVER THE COUNTER MEDICATION Take 1 tablet by mouth 2 (two) times daily. *Regenemax or Biosil*    . OVER THE COUNTER MEDICATION Take 30 mg by mouth daily. *Borotab*    . OVER THE COUNTER MEDICATION Take 1 capsule by mouth daily. *Lypozyme*    . oxyCODONE-acetaminophen (ROXICET) 5-325 MG per tablet Take 1-2 tablets by mouth every 4 (four) hours as needed for severe pain. (Patient taking differently: Take 0.25-1 tablets by mouth every 4 (four) hours as needed for severe pain. ) 30 tablet 0  . potassium chloride (MICRO-K) 10 MEQ CR capsule Take 2 tabs by mouth twice daily.    . RESTASIS 0.05 % ophthalmic emulsion     . STRONTIUM GLUCONATE-B6-B12-FA PO Take 2 tablets by mouth daily.     . traMADol (ULTRAM) 50 MG tablet Take 1 tablet (50 mg total) by mouth every 6 (six) hours as needed. 20 tablet 0  . Vitamin D, Ergocalciferol, (DRISDOL) 50000 units CAPS capsule Take 50,000  Units by mouth every Sunday.    Marland Kitchen VITAMIN K, PHYTONADIONE, PO Take 2 tablets by mouth daily.     Marland Kitchen zinc gluconate 50 MG tablet Take 50-100 mg by mouth 2 (two) times daily. Takes 100mg  in the morning and 50mg  later in the day     Current Facility-Administered Medications  Medication Dose Route Frequency Provider Last Rate Last Dose  . methylPREDNISolone acetate (DEPO-MEDROL) injection 80 mg  80 mg Other Once Magnus Sinning, MD        Allergies: Celery oil; Doxycycline; Rye grass flower pollen extract [gramineae pollens]; Sulfa antibiotics; Tea; and Adhesive [tape]  Past Medical History:  Diagnosis Date  . Abrasion of right leg 12/17/2014  . Adrenal insufficiency ([place])   . Breast cancer of upper-outer quadrant of left female breast ([place]) 12/07/2014  . Cataract, immature    bilateral  . Dental bridge present    upper - x 2  . Dental crowns present   . Hypothyroidism   . Personal history of radiation therapy   . Prediabetes     Past Surgical History:  Procedure Laterality Date  . ABDOMINOPLASTY    . BALLOON SINUPLASTY    . BREAST LUMPECTOMY Left 2016  . BREAST LUMPECTOMY WITH RADIOACTIVE SEED AND SENTINEL LYMPH NODE BIOPSY Left 12/22/2014   Procedure: BREAST LUMPECTOMY WITH RADIOACTIVE SEED AND SENTINEL LYMPH NODE BIOPSY;  Surgeon: Stark Klein, MD;  Location: [facility];  Service: General;  Laterality: Left;  . BUNIONECTOMY Right   . HAMMER TOE SURGERY Bilateral   . RHINOPLASTY    . SEPTOPLASTY      Family History  Problem Relation Age of Onset  . Diabetes Mother   . Diabetes Father   . Cancer Maternal Uncle        pancreatic cancer   . Diabetes Paternal Grandmother   . Diabetes Paternal Grandfather     Social History   Tobacco Use  . Smoking status: Former Smoker    Packs/day: 0.00    Years: 0.00    Pack years: 0.00    Last attempt to quit: 05/01/1980    Years since quitting: 38.1  . Smokeless tobacco: Never Used  Substance Use Topics  . Alcohol use:  Yes    Alcohol/week: 0.0 standard drinks    Comment: occasionally    Subjective:  Patient is requesting breast exam and pap smear; she actually has her healthcare needs managed by [facility] provider; however that office does not offer pap smear services; with  history of breast cancer, needs to continue regular pap screening; sees breast surgeon at least 1 x per year- was told that needed to get breast exam  q 6 months; Planning to see orthopedist next week to discuss treatment options for labral tear;    Objective:  Vitals:   06/21/18 1054  BP: 138/80  Pulse: 74  Temp: (!) 97.5 F (36.4 C)  TempSrc: Oral  SpO2: 98%  Weight: 109 lb 1.9 oz (49.5 kg)  Height: 5\' 3"  (1.6 m)    General: Well developed, well nourished, in no acute distress  Skin : Warm and dry.  Head: Normocephalic and atraumatic  Lungs: Respirations unlabored;  Neurologic: Alert and oriented; speech intact; face symmetrical; moves all extremities well; CNII-XII intact without focal deficit  Pelvic exam: normal external genitalia, vulva, vagina, cervix, pelvic exam deferred due to pain today with hip Breast exam- normal bilaterally; no masses noted, no rashes seen; no axillary lymphadenopathy.  Assessment:  1. Cervical cancer screening     Plan:  Thin Prep pap collected; breast exam is reassuring; follow-up in 1 year, sooner prn; Of note, did start Shingrix at her local pharmacy.   Return in about 1 year (around 06/22/2019).  No orders of the defined types were placed in this encounter.   Requested Prescriptions    No prescriptions requested or ordered in this encounter

## 2018-06-25 LAB — CYTOLOGY - PAP
Diagnosis: NEGATIVE
Trichomonas: NEGATIVE

## 2018-06-28 DIAGNOSIS — M7062 Trochanteric bursitis, left hip: Secondary | ICD-10-CM | POA: Diagnosis not present

## 2018-06-28 DIAGNOSIS — M76892 Other specified enthesopathies of left lower limb, excluding foot: Secondary | ICD-10-CM | POA: Diagnosis not present

## 2018-06-28 DIAGNOSIS — M1612 Unilateral primary osteoarthritis, left hip: Secondary | ICD-10-CM | POA: Diagnosis not present

## 2018-06-28 DIAGNOSIS — M25552 Pain in left hip: Secondary | ICD-10-CM | POA: Diagnosis not present

## 2018-06-28 DIAGNOSIS — S76012A Strain of muscle, fascia and tendon of left hip, initial encounter: Secondary | ICD-10-CM | POA: Diagnosis not present

## 2018-08-05 DIAGNOSIS — L853 Xerosis cutis: Secondary | ICD-10-CM | POA: Diagnosis not present

## 2018-08-05 DIAGNOSIS — B0089 Other herpesviral infection: Secondary | ICD-10-CM | POA: Diagnosis not present

## 2018-08-05 DIAGNOSIS — L570 Actinic keratosis: Secondary | ICD-10-CM | POA: Diagnosis not present

## 2018-08-05 DIAGNOSIS — L298 Other pruritus: Secondary | ICD-10-CM | POA: Diagnosis not present

## 2018-08-05 DIAGNOSIS — L821 Other seborrheic keratosis: Secondary | ICD-10-CM | POA: Diagnosis not present

## 2018-08-05 DIAGNOSIS — Z85828 Personal history of other malignant neoplasm of skin: Secondary | ICD-10-CM | POA: Diagnosis not present

## 2018-08-08 ENCOUNTER — Encounter: Payer: Self-pay | Admitting: Family Medicine

## 2018-08-13 ENCOUNTER — Ambulatory Visit (INDEPENDENT_AMBULATORY_CARE_PROVIDER_SITE_OTHER): Payer: PPO | Admitting: Family Medicine

## 2018-08-13 DIAGNOSIS — M1612 Unilateral primary osteoarthritis, left hip: Secondary | ICD-10-CM | POA: Diagnosis not present

## 2018-08-13 NOTE — Progress Notes (Signed)
[name] [address], [place] [address] Phone: [phone] Subjective:    Virtual Visit via Video Note  I connected with Christina [name] on 08/13/18 at 10:45 AM EDT by a video enabled telemedicine application and verified that I am speaking with the correct person using two identifiers.   I discussed the limitations of evaluation and management by telemedicine and the availability of in person appointments. The patient expressed understanding and agreed to proceed.   Patient is in her house and I am in my office for the virtual visit.  Only 2 people that were within the visit   I discussed the assessment and treatment plan with the patient. The patient was provided an opportunity to ask questions and all were answered. The patient agreed with the plan and demonstrated an understanding of the instructions.   The patient was advised to call back or seek an in-person evaluation if the symptoms worsen or if the condition fails to improve as anticipated.  I provided 35 minutes of non-face-to-face time during this encounter.   Lyndal Pulley, DO    CC:  Left hip   [id number]  Christina [name] is a 68 y.o. female coming in with complaint of left hip pain. Seen, has OA and glute tear. Saw another provider.  Patient is seeing 2 different orthopedic physicians in both him at this time did not want to do any surgical intervention.  Patient was told that most of her pain seemed to be more in the gluteal area than truly pain hip arthritis.  States that most of the pain still seems to be medially.  Some pain laterally.  States that even with walking the leg seems to be weaker.      Past Medical History:  Diagnosis Date  . Abrasion of right leg 12/17/2014  . Adrenal insufficiency ([place])   . Breast cancer of upper-outer quadrant of left female breast ([place]) 12/07/2014  . Cataract, immature    bilateral  . Dental bridge present    upper - x 2   . Dental crowns present   . Hypothyroidism   . Personal history of radiation therapy   . Prediabetes    Past Surgical History:  Procedure Laterality Date  . ABDOMINOPLASTY    . BALLOON SINUPLASTY    . BREAST LUMPECTOMY Left 2016  . BREAST LUMPECTOMY WITH RADIOACTIVE SEED AND SENTINEL LYMPH NODE BIOPSY Left 12/22/2014   Procedure: BREAST LUMPECTOMY WITH RADIOACTIVE SEED AND SENTINEL LYMPH NODE BIOPSY;  Surgeon: Stark Klein, MD;  Location: [facility];  Service: General;  Laterality: Left;  . BUNIONECTOMY Right   . HAMMER TOE SURGERY Bilateral   . RHINOPLASTY    . SEPTOPLASTY     Social History   Socioeconomic History  . Marital status: Widowed    Spouse name: Not on file  . Number of children: Not on file  . Years of education: Not on file  . Highest education level: Not on file  Occupational History    Employer: [employer]  . Financial resource strain: Not on file  . Food insecurity:    Worry: Not on file    Inability: Not on file  . Transportation needs:    Medical: Not on file    Non-medical: Not on file  Tobacco Use  . Smoking status: Former Smoker    Packs/day: 0.00    Years: 0.00    Pack years: 0.00  Last attempt to quit: 05/01/1980    Years since quitting: 38.3  . Smokeless tobacco: Never Used  Substance and Sexual Activity  . Alcohol use: Yes    Alcohol/week: 0.0 standard drinks    Comment: occasionally  . Drug use: No  . Sexual activity: Not on file  Lifestyle  . Physical activity:    Days per week: Not on file    Minutes per session: Not on file  . Stress: Not on file  Relationships  . Social connections:    Talks on phone: Not on file    Gets together: Not on file    Attends religious service: Not on file    Active member of club or organization: Not on file    Attends meetings of clubs or organizations: Not on file    Relationship status: Not on file  Other Topics Concern  . Not on file  Social History  Narrative  . Not on file   Allergies  Allergen Reactions  . Celery Oil Other (See Comments)    FEVER BLISTERS  . Doxycycline Nausea And Vomiting  . Rye Grass Flower Pollen Extract [Gramineae Pollens] Swelling    RYE:  SWELLING THROAT - DENIES SOB  . Sulfa Antibiotics Swelling    NOSE  . Tea Swelling    THROAT - DENIES SOB  . Adhesive [Tape] Other (See Comments)    TEARS SKIN   Family History  Problem Relation Age of Onset  . Diabetes Mother   . Diabetes Father   . Cancer Maternal Uncle        pancreatic cancer   . Diabetes Paternal Grandmother   . Diabetes Paternal Grandfather     Current Outpatient Medications (Endocrine & Metabolic):  .  liothyronine (CYTOMEL) 25 MCG tablet, Take 50 mcg by mouth daily. .  metFORMIN (GLUCOPHAGE-XR) 500 MG 24 hr tablet, Take 1,000 mg by mouth 2 (two) times daily. Takes 1000mg  in the morning, 500mg  at lunch, and 500mg  with supper .  methylPREDNISolone (MEDROL) 4 MG tablet, Take 2.5-4 mg by mouth 3 (three) times daily. Takes 4mg  in the morning and 2.5mg  midday and at bedtime  Current Facility-Administered Medications (Endocrine & Metabolic):  .  methylPREDNISolone acetate (DEPO-MEDROL) injection 80 mg    Current Outpatient Medications (Respiratory):  .  albuterol (PROVENTIL HFA;VENTOLIN HFA) 108 (90 BASE) MCG/ACT inhaler, Inhale into the lungs every 6 (six) hours as needed for wheezing or shortness of breath. .  cetirizine (ZYRTEC) 10 MG tablet, Take 10 mg by mouth daily.  .  fluticasone (FLONASE) 50 MCG/ACT nasal spray, Place 1 spray into both nostrils daily. Marland Kitchen  guaiFENesin (MUCINEX) 600 MG 12 hr tablet, Take 600 mg by mouth daily.  .  montelukast (SINGULAIR) 10 MG tablet, Take 10 mg by mouth at bedtime.   Current Outpatient Medications (Analgesics):  .  ibuprofen (ADVIL,MOTRIN) 200 MG tablet, Take 600 mg by mouth every 4 (four) hours as needed for fever, headache, mild pain, moderate pain or cramping. Marland Kitchen  oxyCODONE-acetaminophen  (ROXICET) 5-325 MG per tablet, Take 1-2 tablets by mouth every 4 (four) hours as needed for severe pain. (Patient taking differently: Take 0.25-1 tablets by mouth every 4 (four) hours as needed for severe pain. ) .  traMADol (ULTRAM) 50 MG tablet, Take 1 tablet (50 mg total) by mouth every 6 (six) hours as needed.     Current Outpatient Medications (Other):  Marland Kitchen  Alpha Lipoic Acid 200 MG CAPS, Take 600 mg by mouth 2 (two) times daily.  Marland Kitchen  Ascorbic Acid (VITAMIN C) 1000 MG tablet, Take 2,000-3,000 mg by mouth 2 (two) times daily. Takes 2000mg  in the morning and 3000mg  at night .  calcium carbonate (OS-CAL) 600 MG TABS tablet, Take 800 mg by mouth 2 (two) times daily with a meal. .  clonazePAM (KLONOPIN) 1 MG tablet, Take 1 mg by mouth at bedtime.  .  Coenzyme Q10 200 MG capsule, Take 200 mg by mouth daily. .  cyclobenzaprine (FLEXERIL) 10 MG tablet, Take 1 tablet (10 mg total) by mouth 3 (three) times daily as needed for muscle spasms. .  Eszopiclone (ESZOPICLONE) 3 MG TABS, Take 0.5 mg by mouth at bedtime. Take immediately before bedtime  .  Flaxseed, Linseed, (FLAXSEED OIL) 1000 MG CAPS, Take 1,000 mg by mouth daily.  Marland Kitchen  MAGNESIUM CITRATE PO*, Take 2 tablets by mouth daily. Marland Kitchen  MAGNESIUM LACTATE PO, Take 2 tablets by mouth at bedtime. .  niacin 500 MG tablet, Take 500 mg by mouth daily.  .  Nutritional Supplements (SYTRINOL PO), Take 1 tablet by mouth daily.  .  ondansetron (ZOFRAN ODT) 4 MG disintegrating tablet, Take 1 tablet (4 mg total) by mouth every 8 (eight) hours as needed for nausea or vomiting. Marland Kitchen  OVER THE COUNTER MEDICATION, Take 1-2 tablets by mouth 2 (two) times daily. *Nutrient 950 with NAC2* takes 2 tablets in the morning and 1 tablet at lunchtime .  OVER THE COUNTER MEDICATION, Take 2 tablets by mouth daily with breakfast. *LV GB Complex* .  OVER THE COUNTER MEDICATION, Take 1 tablet by mouth 2 (two) times daily. *Regenemax or Biosil* .  OVER THE COUNTER MEDICATION, Take 30 mg by  mouth daily. *Borotab* .  OVER THE COUNTER MEDICATION, Take 1 capsule by mouth daily. *Lypozyme* .  potassium chloride (MICRO-K) 10 MEQ CR capsule, Take 2 tabs by mouth twice daily. .  RESTASIS 0.05 % ophthalmic emulsion,  .  STRONTIUM GLUCONATE-B6-B12-FA PO, Take 2 tablets by mouth daily.  .  Vitamin D, Ergocalciferol, (DRISDOL) 50000 units CAPS capsule, Take 50,000 Units by mouth every Sunday. Marland Kitchen  VITAMIN K, PHYTONADIONE, PO, Take 2 tablets by mouth daily.  Marland Kitchen  zinc gluconate 50 MG tablet, Take 50-100 mg by mouth 2 (two) times daily. Takes 100mg  in the morning and 50mg  later in the day  * These medications belong to multiple therapeutic classes and are listed under each applicable group.    Past medical history, social, surgical and family history all reviewed in electronic medical record.  No pertanent information unless stated regarding to the chief complaint.   Review of Systems:  No headache, visual changes, nausea, vomiting, diarrhea, constipation, dizziness, abdominal pain, skin rash, fevers, chills, night sweats, weight loss, swollen lymph nodes, body aches, joint swelling, chest pain, shortness of breath, mood changes.  Positive muscle aches  Objective     General: No apparent distress alert and oriented x3 mood and affect normal, dressed appropriately.  Patient does appear frustrated     Impression and Recommendations:     This case required medical decision making of moderate complexity. The above documentation has been reviewed and is accurate and complete Lyndal Pulley, DO       Note: This dictation was prepared with Dragon dictation along with smaller phrase technology. Any transcriptional errors that result from this process are unintentional.

## 2018-08-14 ENCOUNTER — Encounter: Payer: Self-pay | Admitting: Family Medicine

## 2018-08-14 NOTE — Assessment & Plan Note (Signed)
Discussed with patient in great length.  Did respond somewhat to the intra-articular hip injection.  He has not responded as well to the greater trochanteric injections previously.  We discussed the possibility of a gluteal injection or discussed the possibility of surgical intervention.  Patient is considering the possibility of the gluteal tendon repair.  We discussed the prognosis of this type of surgery.  We discussed the likelihood of needing a hip replacement in the near future as well though.  Patient will discussed with the 2 different orthopedic surgeon where she is seen previously.  I will forward my note as well.  Discussed with her the only other possibility is a PRP injection for the gluteal tendon which I am not highly optimistic for this individual.  Patient is going to consider it.  Follow-up with me again in 6 weeks but likely parenteral again

## 2018-09-02 ENCOUNTER — Encounter: Payer: Self-pay | Admitting: Family Medicine

## 2018-09-04 DIAGNOSIS — H2513 Age-related nuclear cataract, bilateral: Secondary | ICD-10-CM | POA: Diagnosis not present

## 2018-09-04 DIAGNOSIS — H25013 Cortical age-related cataract, bilateral: Secondary | ICD-10-CM | POA: Diagnosis not present

## 2018-09-04 DIAGNOSIS — H04123 Dry eye syndrome of bilateral lacrimal glands: Secondary | ICD-10-CM | POA: Diagnosis not present

## 2018-09-12 ENCOUNTER — Other Ambulatory Visit: Payer: Self-pay

## 2018-09-12 DIAGNOSIS — M1612 Unilateral primary osteoarthritis, left hip: Secondary | ICD-10-CM

## 2018-09-19 DIAGNOSIS — H52222 Regular astigmatism, left eye: Secondary | ICD-10-CM | POA: Diagnosis not present

## 2018-09-19 DIAGNOSIS — H25012 Cortical age-related cataract, left eye: Secondary | ICD-10-CM | POA: Diagnosis not present

## 2018-09-19 DIAGNOSIS — H25812 Combined forms of age-related cataract, left eye: Secondary | ICD-10-CM | POA: Diagnosis not present

## 2018-09-19 DIAGNOSIS — H2512 Age-related nuclear cataract, left eye: Secondary | ICD-10-CM | POA: Diagnosis not present

## 2018-09-24 ENCOUNTER — Encounter: Payer: Self-pay | Admitting: Family Medicine

## 2018-10-03 DIAGNOSIS — H2511 Age-related nuclear cataract, right eye: Secondary | ICD-10-CM | POA: Diagnosis not present

## 2018-10-03 DIAGNOSIS — H25011 Cortical age-related cataract, right eye: Secondary | ICD-10-CM | POA: Diagnosis not present

## 2018-10-03 DIAGNOSIS — H25811 Combined forms of age-related cataract, right eye: Secondary | ICD-10-CM | POA: Diagnosis not present

## 2018-10-18 DIAGNOSIS — M7062 Trochanteric bursitis, left hip: Secondary | ICD-10-CM | POA: Diagnosis not present

## 2018-10-18 DIAGNOSIS — M25552 Pain in left hip: Secondary | ICD-10-CM | POA: Diagnosis not present

## 2018-10-18 DIAGNOSIS — S76012A Strain of muscle, fascia and tendon of left hip, initial encounter: Secondary | ICD-10-CM | POA: Diagnosis not present

## 2018-10-24 DIAGNOSIS — S80861A Insect bite (nonvenomous), right lower leg, initial encounter: Secondary | ICD-10-CM | POA: Diagnosis not present

## 2018-10-24 DIAGNOSIS — Z85828 Personal history of other malignant neoplasm of skin: Secondary | ICD-10-CM | POA: Diagnosis not present

## 2018-10-29 ENCOUNTER — Other Ambulatory Visit: Payer: Self-pay | Admitting: General Surgery

## 2018-10-29 ENCOUNTER — Other Ambulatory Visit: Payer: Self-pay | Admitting: Family

## 2018-10-29 DIAGNOSIS — Z853 Personal history of malignant neoplasm of breast: Secondary | ICD-10-CM

## 2018-10-29 DIAGNOSIS — S76012A Strain of muscle, fascia and tendon of left hip, initial encounter: Secondary | ICD-10-CM | POA: Diagnosis not present

## 2018-10-29 DIAGNOSIS — M7062 Trochanteric bursitis, left hip: Secondary | ICD-10-CM | POA: Diagnosis not present

## 2018-11-05 DIAGNOSIS — R29898 Other symptoms and signs involving the musculoskeletal system: Secondary | ICD-10-CM | POA: Diagnosis not present

## 2018-11-05 DIAGNOSIS — S76012D Strain of muscle, fascia and tendon of left hip, subsequent encounter: Secondary | ICD-10-CM | POA: Diagnosis not present

## 2018-11-05 DIAGNOSIS — M7062 Trochanteric bursitis, left hip: Secondary | ICD-10-CM | POA: Diagnosis not present

## 2018-11-05 DIAGNOSIS — R2689 Other abnormalities of gait and mobility: Secondary | ICD-10-CM | POA: Diagnosis not present

## 2018-11-11 DIAGNOSIS — Z85828 Personal history of other malignant neoplasm of skin: Secondary | ICD-10-CM | POA: Diagnosis not present

## 2018-11-11 DIAGNOSIS — L821 Other seborrheic keratosis: Secondary | ICD-10-CM | POA: Diagnosis not present

## 2018-11-11 DIAGNOSIS — L720 Epidermal cyst: Secondary | ICD-10-CM | POA: Diagnosis not present

## 2018-11-11 DIAGNOSIS — L570 Actinic keratosis: Secondary | ICD-10-CM | POA: Diagnosis not present

## 2018-11-12 DIAGNOSIS — M25552 Pain in left hip: Secondary | ICD-10-CM | POA: Diagnosis not present

## 2018-11-12 DIAGNOSIS — R2689 Other abnormalities of gait and mobility: Secondary | ICD-10-CM | POA: Diagnosis not present

## 2018-11-12 DIAGNOSIS — M6281 Muscle weakness (generalized): Secondary | ICD-10-CM | POA: Diagnosis not present

## 2018-11-19 DIAGNOSIS — R2689 Other abnormalities of gait and mobility: Secondary | ICD-10-CM | POA: Diagnosis not present

## 2018-11-19 DIAGNOSIS — M25552 Pain in left hip: Secondary | ICD-10-CM | POA: Diagnosis not present

## 2018-11-19 DIAGNOSIS — M6281 Muscle weakness (generalized): Secondary | ICD-10-CM | POA: Diagnosis not present

## 2018-11-26 DIAGNOSIS — M6281 Muscle weakness (generalized): Secondary | ICD-10-CM | POA: Diagnosis not present

## 2018-11-26 DIAGNOSIS — M25552 Pain in left hip: Secondary | ICD-10-CM | POA: Diagnosis not present

## 2018-11-26 DIAGNOSIS — R2689 Other abnormalities of gait and mobility: Secondary | ICD-10-CM | POA: Diagnosis not present

## 2018-12-03 DIAGNOSIS — M6281 Muscle weakness (generalized): Secondary | ICD-10-CM | POA: Diagnosis not present

## 2018-12-03 DIAGNOSIS — M25552 Pain in left hip: Secondary | ICD-10-CM | POA: Diagnosis not present

## 2018-12-03 DIAGNOSIS — S76012D Strain of muscle, fascia and tendon of left hip, subsequent encounter: Secondary | ICD-10-CM | POA: Diagnosis not present

## 2018-12-03 DIAGNOSIS — R269 Unspecified abnormalities of gait and mobility: Secondary | ICD-10-CM | POA: Diagnosis not present

## 2018-12-03 DIAGNOSIS — M7062 Trochanteric bursitis, left hip: Secondary | ICD-10-CM | POA: Diagnosis not present

## 2018-12-16 ENCOUNTER — Ambulatory Visit
Admission: RE | Admit: 2018-12-16 | Discharge: 2018-12-16 | Disposition: A | Discharge status: Home / Self Care | Payer: PPO | Source: Ambulatory Visit | Attending: Family | Admitting: Family

## 2018-12-16 ENCOUNTER — Other Ambulatory Visit: Payer: Self-pay

## 2018-12-16 DIAGNOSIS — Z853 Personal history of malignant neoplasm of breast: Secondary | ICD-10-CM

## 2018-12-16 DIAGNOSIS — R928 Other abnormal and inconclusive findings on diagnostic imaging of breast: Secondary | ICD-10-CM | POA: Diagnosis not present

## 2018-12-27 DIAGNOSIS — Z853 Personal history of malignant neoplasm of breast: Secondary | ICD-10-CM | POA: Diagnosis not present

## 2019-01-01 DIAGNOSIS — S76012A Strain of muscle, fascia and tendon of left hip, initial encounter: Secondary | ICD-10-CM | POA: Diagnosis not present

## 2019-01-03 ENCOUNTER — Ambulatory Visit (INDEPENDENT_AMBULATORY_CARE_PROVIDER_SITE_OTHER): Payer: PPO | Admitting: Family Medicine

## 2019-01-03 ENCOUNTER — Other Ambulatory Visit: Payer: Self-pay

## 2019-01-03 VITALS — BP 122/64 | HR 86 | Ht 63.0 in

## 2019-01-03 DIAGNOSIS — M1612 Unilateral primary osteoarthritis, left hip: Secondary | ICD-10-CM | POA: Diagnosis not present

## 2019-01-03 DIAGNOSIS — S76012A Strain of muscle, fascia and tendon of left hip, initial encounter: Secondary | ICD-10-CM

## 2019-01-03 DIAGNOSIS — M545 Low back pain, unspecified: Secondary | ICD-10-CM

## 2019-01-03 NOTE — Progress Notes (Signed)
[name] [place],  [address] Phone: [phone] Subjective:       CC: Hip and back pain follow-up  [id number]     Update 01/03/2019 Christina [name] is a 68 y.o. female coming in with complaint of bilateral hip pain. Did speak with physician about surgery and decided to not have it. Walking up hills cause pain in her lower back. Denies any radiating symptoms. Is using Advil and Tylenol.  Patient has both a labral tear, moderate arthritic changes of the left hip as well as a gluteal tear.  Has been seen other providers.  At this moment patient is failed all therapy including formal physical therapy which he states seems to make it worse.  Patient states that the posterior aspect of the pain of the hip seems to be the worst.     Past Medical History:  Diagnosis Date   Abrasion of right leg 12/17/2014   Adrenal insufficiency (HCC)    Breast cancer of upper-outer quadrant of left female breast ([name]) 12/07/2014   Cataract, immature    bilateral   Dental bridge present    upper - x 2   Dental crowns present    Hypothyroidism    Personal history of radiation therapy    Prediabetes    Past Surgical History:  Procedure Laterality Date   ABDOMINOPLASTY     BALLOON SINUPLASTY     BREAST LUMPECTOMY Left 2016   BREAST LUMPECTOMY WITH RADIOACTIVE SEED AND SENTINEL LYMPH NODE BIOPSY Left 12/22/2014   Procedure: BREAST LUMPECTOMY WITH RADIOACTIVE SEED AND SENTINEL LYMPH NODE BIOPSY;  Surgeon: Stark Klein, MD;  Location: [facility];  Service: General;  Laterality: Left;   BUNIONECTOMY Right    HAMMER TOE SURGERY Bilateral    RHINOPLASTY     SEPTOPLASTY     Social History   Socioeconomic History   Marital status: Widowed    Spouse name: Not on file   Number of children: Not on file   Years of education: Not on file   Highest education level: Not on file  Occupational History    Employer:  [employer] resource strain: Not on file   Food insecurity    Worry: Not on file    Inability: Not on file   Transportation needs    Medical: Not on file    Non-medical: Not on file  Tobacco Use   Smoking status: Former Smoker    Packs/day: 0.00    Years: 0.00    Pack years: 0.00    Quit date: 05/01/1980    Years since quitting: 38.7   Smokeless tobacco: Never Used  Substance and Sexual Activity   Alcohol use: Yes    Alcohol/week: 0.0 standard drinks    Comment: occasionally   Drug use: No   Sexual activity: Not on file  Lifestyle   Physical activity    Days per week: Not on file    Minutes per session: Not on file   Stress: Not on file  Relationships   Social connections    Talks on phone: Not on file    Gets together: Not on file    Attends religious service: Not on file    Active member of club or organization: Not on file    Attends meetings of clubs or organizations: Not on file    Relationship status: Not on file  Other Topics Concern   Not on  file  Social History Narrative   Not on file   Allergies  Allergen Reactions   Celery Oil Other (See Comments)    FEVER BLISTERS   Doxycycline Nausea And Vomiting   Rye Grass Flower Pollen Extract [Gramineae Pollens] Swelling    RYE:  SWELLING THROAT - DENIES SOB   Sulfa Antibiotics Swelling    NOSE   Tea Swelling    THROAT - DENIES SOB   Adhesive [Tape] Other (See Comments)    TEARS SKIN   Family History  Problem Relation Age of Onset   Diabetes Mother    Diabetes Father    Cancer Maternal Uncle        pancreatic cancer    Diabetes Paternal Grandmother    Diabetes Paternal Grandfather     Current Outpatient Medications (Endocrine & Metabolic):    liothyronine (CYTOMEL) 25 MCG tablet, Take 50 mcg by mouth daily.   metFORMIN (GLUCOPHAGE-XR) 500 MG 24 hr tablet, Take 1,000 mg by mouth 2 (two) times daily. Takes 1000mg  in the morning, 500mg  at lunch, and  500mg  with supper   methylPREDNISolone (MEDROL) 4 MG tablet, Take 2.5-4 mg by mouth 3 (three) times daily. Takes 4mg  in the morning and 2.5mg  midday and at bedtime  Current Facility-Administered Medications (Endocrine & Metabolic):    methylPREDNISolone acetate (DEPO-MEDROL) injection 80 mg    Current Outpatient Medications (Respiratory):    albuterol (PROVENTIL HFA;VENTOLIN HFA) 108 (90 BASE) MCG/ACT inhaler, Inhale into the lungs every 6 (six) hours as needed for wheezing or shortness of breath.   cetirizine (ZYRTEC) 10 MG tablet, Take 10 mg by mouth daily.    fluticasone (FLONASE) 50 MCG/ACT nasal spray, Place 1 spray into both nostrils daily.   guaiFENesin (MUCINEX) 600 MG 12 hr tablet, Take 600 mg by mouth daily.    montelukast (SINGULAIR) 10 MG tablet, Take 10 mg by mouth at bedtime.   Current Outpatient Medications (Analgesics):    ibuprofen (ADVIL,MOTRIN) 200 MG tablet, Take 600 mg by mouth every 4 (four) hours as needed for fever, headache, mild pain, moderate pain or cramping.   oxyCODONE-acetaminophen (ROXICET) 5-325 MG per tablet, Take 1-2 tablets by mouth every 4 (four) hours as needed for severe pain. (Patient taking differently: Take 0.25-1 tablets by mouth every 4 (four) hours as needed for severe pain. )   traMADol (ULTRAM) 50 MG tablet, Take 1 tablet (50 mg total) by mouth every 6 (six) hours as needed.     Current Outpatient Medications (Other):    Alpha Lipoic Acid 200 MG CAPS, Take 600 mg by mouth 2 (two) times daily.    Ascorbic Acid (VITAMIN C) 1000 MG tablet, Take 2,000-3,000 mg by mouth 2 (two) times daily. Takes 2000mg  in the morning and 3000mg  at night   calcium carbonate (OS-CAL) 600 MG TABS tablet, Take 800 mg by mouth 2 (two) times daily with a meal.   clonazePAM (KLONOPIN) 1 MG tablet, Take 1 mg by mouth at bedtime.    Coenzyme Q10 200 MG capsule, Take 200 mg by mouth daily.   cyclobenzaprine (FLEXERIL) 10 MG tablet, Take 1 tablet (10 mg  total) by mouth 3 (three) times daily as needed for muscle spasms.   Flaxseed, Linseed, (FLAXSEED OIL) 1000 MG CAPS, Take 1,000 mg by mouth daily.    MAGNESIUM CITRATE PO*, Take 2 tablets by mouth daily.   MAGNESIUM LACTATE PO, Take 2 tablets by mouth at bedtime.   niacin 500 MG tablet, Take 500 mg by mouth daily.  Nutritional Supplements (SYTRINOL PO), Take 1 tablet by mouth daily.    ondansetron (ZOFRAN ODT) 4 MG disintegrating tablet, Take 1 tablet (4 mg total) by mouth every 8 (eight) hours as needed for nausea or vomiting.   OVER THE COUNTER MEDICATION, Take 1-2 tablets by mouth 2 (two) times daily. *Nutrient 950 with NAC2* takes 2 tablets in the morning and 1 tablet at lunchtime   OVER THE COUNTER MEDICATION, Take 2 tablets by mouth daily with breakfast. *LV GB Complex*   OVER THE COUNTER MEDICATION, Take 1 tablet by mouth 2 (two) times daily. *Regenemax or Biosil*   OVER THE COUNTER MEDICATION, Take 30 mg by mouth daily. *Borotab*   OVER THE COUNTER MEDICATION, Take 1 capsule by mouth daily. *Lypozyme*   potassium chloride (MICRO-K) 10 MEQ CR capsule, Take 2 tabs by mouth twice daily.   STRONTIUM GLUCONATE-B6-B12-FA PO, Take 2 tablets by mouth daily.    Vitamin D, Ergocalciferol, (DRISDOL) 50000 units CAPS capsule, Take 50,000 Units by mouth every Sunday.   VITAMIN K, PHYTONADIONE, PO, Take 2 tablets by mouth daily.    zinc gluconate 50 MG tablet, Take 50-100 mg by mouth 2 (two) times daily. Takes 100mg  in the morning and 50mg  later in the day   Eszopiclone (ESZOPICLONE) 3 MG TABS, Take 0.5 mg by mouth at bedtime. Take immediately before bedtime    RESTASIS 0.05 % ophthalmic emulsion,   * These medications belong to multiple therapeutic classes and are listed under each applicable group.    Past medical history, social, surgical and family history all reviewed in electronic medical record.  No pertanent information unless stated regarding to the chief complaint.    Review of Systems:  No headache, visual changes, nausea, vomiting, diarrhea, constipation, dizziness, abdominal pain, skin rash, fevers, chills, night sweats, weight loss, swollen lymph nodes,, chest pain, shortness of breath, mood changes.  Positive muscle aches, body aches  Objective  Blood pressure 122/64, pulse 86, height 5\' 3"  (1.6 m), SpO2 96 %.    General: No apparent distress alert and oriented x3 mood and affect normal, dressed appropriately.  Patient is underweight HEENT: Pupils equal, extraocular movements intact  Respiratory: Patient's speak in full sentences and does not appear short of breath  Cardiovascular: No lower extremity edema, non tender, no erythema  Skin: Warm dry intact with no signs of infection or rash on extremities or on axial skeleton.  Abdomen: Soft nontender  Neuro: Cranial nerves II through XII are intact, neurovascularly intact in all extremities with 2+ DTRs and 2+ pulses.  Lymph: No lymphadenopathy of posterior or anterior cervical chain or axillae bilaterally.  Gait severely antalgic MSK:  tender with full range of motion and good stability and symmetric strength and tone of shoulders, elbows, wrist,  knee and ankles bilaterally.  Mild arthritic changes of multiple joints Left hip does have significant weakness.  Trendelenburg noted, patient has minimal internal range of motion lacks the last 5 degrees of external range of motion as well.  Negative straight leg test.  4-5 strength of hip flexion and 3-5 strength of hip abduction     Impression and Recommendations:     This case required medical decision making of moderate complexity. The above documentation has been reviewed and is accurate and complete Lyndal Pulley, DO       Note: This dictation was prepared with Dragon dictation along with smaller phrase technology. Any transcriptional errors that result from this process are unintentional.

## 2019-01-03 NOTE — Patient Instructions (Addendum)
Liquid IV, Hydration Multiplier- Look on [employer] Read up on PRP for the hip Call to schedule PRP if you want

## 2019-01-04 ENCOUNTER — Encounter: Payer: Self-pay | Admitting: Family Medicine

## 2019-01-04 DIAGNOSIS — S76012A Strain of muscle, fascia and tendon of left hip, initial encounter: Secondary | ICD-10-CM | POA: Insufficient documentation

## 2019-01-04 NOTE — Assessment & Plan Note (Signed)
Tear noted.  Discussed posture and ergonomics discussed during possibility of the spinal nerves.  Patient did not make any improvement with that.  Mild improvement when we did the intra-articular injection of the hip.  Patient at this point has done all conservative therapy.  Secondary to patient's body weight and weakness of the hip girdle patient seems to not be a candidate for the possibility of a replacement.  Discussed home exercises and the possibility of PRP.  Patient will consider this and call us if she decides to do that.  Otherwise we have exhausted all treatment options that we have available for the individual.  Spent  25 minutes with patient face-to-face and had greater than 50% of counseling including as described above in assessment and plan.

## 2019-01-06 ENCOUNTER — Encounter: Payer: Self-pay | Admitting: Family Medicine

## 2019-01-07 ENCOUNTER — Encounter: Payer: Self-pay | Admitting: Family Medicine

## 2019-01-07 ENCOUNTER — Telehealth: Payer: Self-pay

## 2019-01-07 NOTE — Telephone Encounter (Signed)
Left message for patient to call back to schedule for PRP injection.

## 2019-01-08 ENCOUNTER — Ambulatory Visit: Payer: Self-pay

## 2019-01-08 ENCOUNTER — Other Ambulatory Visit: Payer: Self-pay

## 2019-01-08 ENCOUNTER — Ambulatory Visit (INDEPENDENT_AMBULATORY_CARE_PROVIDER_SITE_OTHER): Payer: Self-pay | Admitting: Family Medicine

## 2019-01-08 VITALS — Ht 63.0 in | Wt 109.0 lb

## 2019-01-08 DIAGNOSIS — S76012A Strain of muscle, fascia and tendon of left hip, initial encounter: Secondary | ICD-10-CM

## 2019-01-08 DIAGNOSIS — M25552 Pain in left hip: Secondary | ICD-10-CM

## 2019-01-08 NOTE — Assessment & Plan Note (Signed)
PRP injection given today.  Patient tolerated the procedure fairly well.  Discussed with patient secondary to the longevity of this as well as patient's other underlying problems at this time contribute.  We discussed which activities of doing which wants to avoid.  Patient will follow-up with me again in 3 to 4 weeks.  If continuing to have trouble will be difficult to the doing other treatments at this time.

## 2019-01-08 NOTE — Progress Notes (Signed)
[name] [address] [place], [place] [address] Phone: [phone] Subjective:   Christina [name], am serving as a scribe for Dr. Hulan Saas.     CC: Left hip pain follow-up  [id number]  Christina [name] is a 68 y.o. female coming in with complaint of left hip pain. Patient states continues have pain.  Has known gluteal tear and seems to be giving her more discomfort than truly the labral tear or the osteoarthritic changes of the hip that has been found with.  Previous imaging.  Here for PRP injection      Past Medical History:  Diagnosis Date  . Abrasion of right leg 12/17/2014  . Adrenal insufficiency ([facility])   . Breast cancer of upper-outer quadrant of left female breast ([facility]) 12/07/2014  . Cataract, immature    bilateral  . Dental bridge present    upper - x 2  . Dental crowns present   . Hypothyroidism   . Personal history of radiation therapy   . Prediabetes    Past Surgical History:  Procedure Laterality Date  . ABDOMINOPLASTY    . BALLOON SINUPLASTY    . BREAST LUMPECTOMY Left 2016  . BREAST LUMPECTOMY WITH RADIOACTIVE SEED AND SENTINEL LYMPH NODE BIOPSY Left 12/22/2014   Procedure: BREAST LUMPECTOMY WITH RADIOACTIVE SEED AND SENTINEL LYMPH NODE BIOPSY;  Surgeon: Stark Klein, MD;  Location: [facility];  Service: General;  Laterality: Left;  . BUNIONECTOMY Right   . HAMMER TOE SURGERY Bilateral   . RHINOPLASTY    . SEPTOPLASTY     Social History   Socioeconomic History  . Marital status: Widowed    Spouse name: Not on file  . Number of children: Not on file  . Years of education: Not on file  . Highest education level: Not on file  Occupational History    Employer: [employer]  . Financial resource strain: Not on file  . Food insecurity    Worry: Not on file    Inability: Not on file  . Transportation needs    Medical: Not on file    Non-medical: Not on file  Tobacco Use  . Smoking  status: Former Smoker    Packs/day: 0.00    Years: 0.00    Pack years: 0.00    Quit date: 05/01/1980    Years since quitting: 38.7  . Smokeless tobacco: Never Used  Substance and Sexual Activity  . Alcohol use: Yes    Alcohol/week: 0.0 standard drinks    Comment: occasionally  . Drug use: [name]  . Sexual activity: Not on file  Lifestyle  . Physical activity    Days per week: Not on file    Minutes per session: Not on file  . Stress: Not on file  Relationships  . Social Herbalist on phone: Not on file    Gets together: Not on file    Attends religious service: Not on file    Active member of club or organization: Not on file    Attends meetings of clubs or organizations: Not on file    Relationship status: Not on file  Other Topics Concern  . Not on file  Social History Narrative  . Not on file   Allergies  Allergen Reactions  . Celery Oil Other (See Comments)    FEVER BLISTERS  . Doxycycline Nausea And Vomiting  . Rye Grass Flower Pollen Extract [Gramineae Pollens] Swelling  RYE:  SWELLING THROAT - DENIES SOB  . Sulfa Antibiotics Swelling    NOSE  . Tea Swelling    THROAT - DENIES SOB  . Adhesive [Tape] Other (See Comments)    TEARS SKIN   Family History  Problem Relation Age of Onset  . Diabetes Mother   . Diabetes Father   . Cancer Maternal Uncle        pancreatic cancer   . Diabetes Paternal Grandmother   . Diabetes Paternal Grandfather     Current Outpatient Medications (Endocrine & Metabolic):  .  liothyronine (CYTOMEL) 25 MCG tablet, Take 50 mcg by mouth daily. .  metFORMIN (GLUCOPHAGE-XR) 500 MG 24 hr tablet, Take 1,000 mg by mouth 2 (two) times daily. Takes 1000mg  in the morning, 500mg  at lunch, and 500mg  with supper .  methylPREDNISolone (MEDROL) 4 MG tablet, Take 2.5-4 mg by mouth 3 (three) times daily. Takes 4mg  in the morning and 2.5mg  midday and at bedtime  Current Facility-Administered Medications (Endocrine & Metabolic):  .   methylPREDNISolone acetate (DEPO-MEDROL) injection 80 mg    Current Outpatient Medications (Respiratory):  .  albuterol (PROVENTIL HFA;VENTOLIN HFA) 108 (90 BASE) MCG/ACT inhaler, Inhale into the lungs every 6 (six) hours as needed for wheezing or shortness of breath. .  cetirizine (ZYRTEC) 10 MG tablet, Take 10 mg by mouth daily.  .  fluticasone (FLONASE) 50 MCG/ACT nasal spray, Place 1 spray into both nostrils daily. Marland Kitchen  guaiFENesin (MUCINEX) 600 MG 12 hr tablet, Take 600 mg by mouth daily.  .  montelukast (SINGULAIR) 10 MG tablet, Take 10 mg by mouth at bedtime.   Current Outpatient Medications (Analgesics):  .  ibuprofen (ADVIL,MOTRIN) 200 MG tablet, Take 600 mg by mouth every 4 (four) hours as needed for fever, headache, mild pain, moderate pain or cramping. Marland Kitchen  oxyCODONE-acetaminophen (ROXICET) 5-325 MG per tablet, Take 1-2 tablets by mouth every 4 (four) hours as needed for severe pain. (Patient taking differently: Take 0.25-1 tablets by mouth every 4 (four) hours as needed for severe pain. ) .  traMADol (ULTRAM) 50 MG tablet, Take 1 tablet (50 mg total) by mouth every 6 (six) hours as needed.     Current Outpatient Medications (Other):  Marland Kitchen  Alpha Lipoic Acid 200 MG CAPS, Take 600 mg by mouth 2 (two) times daily.  .  Ascorbic Acid (VITAMIN C) 1000 MG tablet, Take 2,000-3,000 mg by mouth 2 (two) times daily. Takes 2000mg  in the morning and 3000mg  at night .  calcium carbonate (OS-CAL) 600 MG TABS tablet, Take 800 mg by mouth 2 (two) times daily with a meal. .  clonazePAM (KLONOPIN) 1 MG tablet, Take 1 mg by mouth at bedtime.  .  Coenzyme Q10 200 MG capsule, Take 200 mg by mouth daily. .  cyclobenzaprine (FLEXERIL) 10 MG tablet, Take 1 tablet (10 mg total) by mouth 3 (three) times daily as needed for muscle spasms. .  Eszopiclone (ESZOPICLONE) 3 MG TABS, Take 0.5 mg by mouth at bedtime. Take immediately before bedtime  .  Flaxseed, Linseed, (FLAXSEED OIL) 1000 MG CAPS, Take 1,000 mg by  mouth daily.  Marland Kitchen  MAGNESIUM CITRATE PO*, Take 2 tablets by mouth daily. Marland Kitchen  MAGNESIUM LACTATE PO, Take 2 tablets by mouth at bedtime. .  niacin 500 MG tablet, Take 500 mg by mouth daily.  .  Nutritional Supplements (SYTRINOL PO), Take 1 tablet by mouth daily.  .  ondansetron (ZOFRAN ODT) 4 MG disintegrating tablet, Take 1 tablet (4 mg total) by  mouth every 8 (eight) hours as needed for nausea or vomiting. Marland Kitchen  OVER THE COUNTER MEDICATION, Take 1-2 tablets by mouth 2 (two) times daily. *Nutrient 950 with NAC2* takes 2 tablets in the morning and 1 tablet at lunchtime .  OVER THE COUNTER MEDICATION, Take 2 tablets by mouth daily with breakfast. *LV GB Complex* .  OVER THE COUNTER MEDICATION, Take 1 tablet by mouth 2 (two) times daily. *Regenemax or Biosil* .  OVER THE COUNTER MEDICATION, Take 30 mg by mouth daily. *Borotab* .  OVER THE COUNTER MEDICATION, Take 1 capsule by mouth daily. *Lypozyme* .  potassium chloride (MICRO-K) 10 MEQ CR capsule, Take 2 tabs by mouth twice daily. .  RESTASIS 0.05 % ophthalmic emulsion,  .  STRONTIUM GLUCONATE-B6-B12-FA PO, Take 2 tablets by mouth daily.  .  Vitamin D, Ergocalciferol, (DRISDOL) 50000 units CAPS capsule, Take 50,000 Units by mouth every Sunday. Marland Kitchen  VITAMIN K, PHYTONADIONE, PO, Take 2 tablets by mouth daily.  Marland Kitchen  zinc gluconate 50 MG tablet, Take 50-100 mg by mouth 2 (two) times daily. Takes 100mg  in the morning and 50mg  later in the day  * These medications belong to multiple therapeutic classes and are listed under each applicable group.    Past medical history, social, surgical and family history all reviewed in electronic medical record.  [name] pertanent information unless stated regarding to the chief complaint.   Review of Systems:  [name] headache, visual changes, nausea, vomiting, diarrhea, constipation, dizziness, abdominal pain, skin rash, fevers, chills, night sweats, weight loss, swollen lymph nodes, body aches, joint swelling, muscle aches, chest  pain, shortness of breath, mood changes.   Objective  There were [name] vitals taken for this visit. Systems examined below as of    General: [name] apparent distress alert and oriented x3 mood and affect normal, dressed appropriately.  HEENT: Pupils equal, extraocular movements intact  Respiratory: Patient's speak in full sentences and does not appear short of breath  Cardiovascular: [name] lower extremity edema, non tender, [name] erythema  Skin: Warm dry intact with [name] signs of infection or rash on extremities or on axial skeleton.  Abdomen: Soft nontender  Neuro: Cranial nerves II through XII are intact, neurovascularly intact in all extremities with 2+ DTRs and 2+ pulses.  Lymph: [name] lymphadenopathy of posterior or anterior cervical chain or axillae bilaterally.  Gait antalgic  Procedure: Real-time Ultrasound Guided Injection of left gluteal tendon  Device: GE Logiq Q7 Ultrasound guided injection is preferred based studies that show increased duration, increased effect, greater accuracy, decreased procedural pain, increased response rate, and decreased cost with ultrasound guided versus blind injection.  Verbal informed consent obtained.  Time-out conducted.  Noted [name] overlying erythema, induration, or other signs of local infection.  Skin prepped in a sterile fashion.  Local anesthesia: Topical Ethyl chloride.  With sterile technique and under real time ultrasound guidance: With a 21-gauge 2 inch needle injected with 0.5 cc of 0.5% Marcaine and then injected with pre-centrifuge PRP total of 6 cc Completed without difficulty  Pain immediately resolved suggesting accurate placement of the medication.  Advised to call if fevers/chills, erythema, induration, drainage, or persistent bleeding.  Images permanently stored and available for review in the ultrasound unit.  Impression: Technically successful ultrasound guided injection.   Impression and Recommendations:     This case required medical  decision making of moderate complexity. The above documentation has been reviewed and is accurate and complete Lyndal Pulley, DO       Note:  This dictation was prepared with Dragon dictation along with smaller phrase technology. Any transcriptional errors that result from this process are unintentional.

## 2019-01-08 NOTE — Patient Instructions (Signed)
Good to see you  Read the instructions See me again in 3-4 weeks

## 2019-01-09 ENCOUNTER — Encounter: Payer: Self-pay | Admitting: Family Medicine

## 2019-01-16 ENCOUNTER — Ambulatory Visit
Admission: RE | Admit: 2019-01-16 | Discharge: 2019-01-16 | Disposition: A | Discharge status: Home / Self Care | Payer: PPO | Source: Ambulatory Visit | Attending: Family Medicine | Admitting: Family Medicine

## 2019-01-16 DIAGNOSIS — M545 Low back pain, unspecified: Secondary | ICD-10-CM

## 2019-01-16 DIAGNOSIS — M5416 Radiculopathy, lumbar region: Secondary | ICD-10-CM | POA: Diagnosis not present

## 2019-01-16 MED ORDER — METHYLPREDNISOLONE ACETATE 40 MG/ML INJ SUSP (RADIOLOG
120.0000 mg | Freq: Once | INTRAMUSCULAR | Status: AC
  Administered 2019-01-16: 120 mg via EPIDURAL

## 2019-01-16 MED ORDER — IOPAMIDOL (ISOVUE-M 200) INJECTION 41%
1.0000 mL | Freq: Once | INTRAMUSCULAR | Status: AC
  Administered 2019-01-16: 1 mL via EPIDURAL

## 2019-01-16 NOTE — Discharge Instructions (Signed)

## 2019-01-31 ENCOUNTER — Encounter: Payer: Self-pay | Admitting: Family Medicine

## 2019-01-31 ENCOUNTER — Ambulatory Visit: Payer: PPO | Admitting: Family Medicine

## 2019-01-31 ENCOUNTER — Other Ambulatory Visit: Payer: Self-pay

## 2019-01-31 DIAGNOSIS — M1612 Unilateral primary osteoarthritis, left hip: Secondary | ICD-10-CM

## 2019-01-31 DIAGNOSIS — S76012D Strain of muscle, fascia and tendon of left hip, subsequent encounter: Secondary | ICD-10-CM

## 2019-01-31 DIAGNOSIS — S3421XD Injury of nerve root of lumbar spine, subsequent encounter: Secondary | ICD-10-CM | POA: Diagnosis not present

## 2019-01-31 NOTE — Assessment & Plan Note (Signed)
Responding well to the nerve root injection.  Hopefully patient will make some improvement still.  Patient will increase activity as tolerated.  Follow-up again in 4 to 6 weeks

## 2019-01-31 NOTE — Patient Instructions (Signed)
Happy with where she is going Stick with biking Send message in 3 weeks If not better can consider PRP

## 2019-01-31 NOTE — Progress Notes (Signed)
[name] [address], [place] [address] Phone: [phone] Subjective:   Christina [name], am serving as a scribe for Dr. Hulan Saas.  I'm seeing this patient by the request  of:    CC: Left hip pain follow-up  [id number]  Christina [name] is a 68 y.o. female coming in with complaint of left hip pain. PRP injection given last visit on 01/08/2019. Patient states that is having less pain in her hip since PRP injection. Also feels that the epidural injection helped to alleviate her pain. Only notes a stiffness in her legs. [name] pain in back or hips. Has started to work out again.  Patient would like to start walking on a regular basis but finds that he has increased tightness.     Past Medical History:  Diagnosis Date   Abrasion of right leg 12/17/2014   Adrenal insufficiency (HCC)    Breast cancer of upper-outer quadrant of left female breast ([place]) 12/07/2014   Cataract, immature    bilateral   Dental bridge present    upper - x 2   Dental crowns present    Hypothyroidism    Personal history of radiation therapy    Prediabetes    Past Surgical History:  Procedure Laterality Date   ABDOMINOPLASTY     BALLOON SINUPLASTY     BREAST LUMPECTOMY Left 2016   BREAST LUMPECTOMY WITH RADIOACTIVE SEED AND SENTINEL LYMPH NODE BIOPSY Left 12/22/2014   Procedure: BREAST LUMPECTOMY WITH RADIOACTIVE SEED AND SENTINEL LYMPH NODE BIOPSY;  Surgeon: Stark Klein, MD;  Location: [facility];  Service: General;  Laterality: Left;   BUNIONECTOMY Right    HAMMER TOE SURGERY Bilateral    RHINOPLASTY     SEPTOPLASTY     Social History   Socioeconomic History   Marital status: Widowed    Spouse name: Not on file   Number of children: Not on file   Years of education: Not on file   Highest education level: Not on file  Occupational History    Employer: [employer] resource strain: Not on  file   Food insecurity    Worry: Not on file    Inability: Not on file   Transportation needs    Medical: Not on file    Non-medical: Not on file  Tobacco Use   Smoking status: Former Smoker    Packs/day: 0.00    Years: 0.00    Pack years: 0.00    Quit date: 05/01/1980    Years since quitting: 38.7   Smokeless tobacco: Never Used  Substance and Sexual Activity   Alcohol use: Yes    Alcohol/week: 0.0 standard drinks    Comment: occasionally   Drug use: [name]   Sexual activity: Not on file  Lifestyle   Physical activity    Days per week: Not on file    Minutes per session: Not on file   Stress: Not on file  Relationships   Social connections    Talks on phone: Not on file    Gets together: Not on file    Attends religious service: Not on file    Active member of club or organization: Not on file    Attends meetings of clubs or organizations: Not on file    Relationship status: Not on file  Other Topics Concern   Not on file  Social History Narrative   Not on file  Allergies  Allergen Reactions   Celery Oil Other (See Comments)    FEVER BLISTERS   Doxycycline Nausea And Vomiting   Rye Grass Flower Pollen Extract [Gramineae Pollens] Swelling    RYE:  SWELLING THROAT - DENIES SOB   Sulfa Antibiotics Swelling    NOSE   Tea Swelling    THROAT - DENIES SOB   Adhesive [Tape] Other (See Comments)    TEARS SKIN   Family History  Problem Relation Age of Onset   Diabetes Mother    Diabetes Father    Cancer Maternal Uncle        pancreatic cancer    Diabetes Paternal Grandmother    Diabetes Paternal Grandfather     Current Outpatient Medications (Endocrine & Metabolic):    liothyronine (CYTOMEL) 25 MCG tablet, Take 50 mcg by mouth daily.   metFORMIN (GLUCOPHAGE-XR) 500 MG 24 hr tablet, Take 1,000 mg by mouth 2 (two) times daily. Takes 1000mg  in the morning, 500mg  at lunch, and 500mg  with supper   methylPREDNISolone (MEDROL) 4 MG tablet, Take  2.5-4 mg by mouth 3 (three) times daily. Takes 4mg  in the morning and 2.5mg  midday and at bedtime  Current Facility-Administered Medications (Endocrine & Metabolic):    methylPREDNISolone acetate (DEPO-MEDROL) injection 80 mg    Current Outpatient Medications (Respiratory):    albuterol (PROVENTIL HFA;VENTOLIN HFA) 108 (90 BASE) MCG/ACT inhaler, Inhale into the lungs every 6 (six) hours as needed for wheezing or shortness of breath.   cetirizine (ZYRTEC) 10 MG tablet, Take 10 mg by mouth daily.    fluticasone (FLONASE) 50 MCG/ACT nasal spray, Place 1 spray into both nostrils daily.   guaiFENesin (MUCINEX) 600 MG 12 hr tablet, Take 600 mg by mouth daily.    montelukast (SINGULAIR) 10 MG tablet, Take 10 mg by mouth at bedtime.   Current Outpatient Medications (Analgesics):    ibuprofen (ADVIL,MOTRIN) 200 MG tablet, Take 600 mg by mouth every 4 (four) hours as needed for fever, headache, mild pain, moderate pain or cramping.   oxyCODONE-acetaminophen (ROXICET) 5-325 MG per tablet, Take 1-2 tablets by mouth every 4 (four) hours as needed for severe pain. (Patient taking differently: Take 0.25-1 tablets by mouth every 4 (four) hours as needed for severe pain. )   traMADol (ULTRAM) 50 MG tablet, Take 1 tablet (50 mg total) by mouth every 6 (six) hours as needed.     Current Outpatient Medications (Other):    Alpha Lipoic Acid 200 MG CAPS, Take 600 mg by mouth 2 (two) times daily.    Ascorbic Acid (VITAMIN C) 1000 MG tablet, Take 2,000-3,000 mg by mouth 2 (two) times daily. Takes 2000mg  in the morning and 3000mg  at night   calcium carbonate (OS-CAL) 600 MG TABS tablet, Take 800 mg by mouth 2 (two) times daily with a meal.   clonazePAM (KLONOPIN) 1 MG tablet, Take 1 mg by mouth at bedtime.    Coenzyme Q10 200 MG capsule, Take 200 mg by mouth daily.   cyclobenzaprine (FLEXERIL) 10 MG tablet, Take 1 tablet (10 mg total) by mouth 3 (three) times daily as needed for muscle spasms.    Eszopiclone (ESZOPICLONE) 3 MG TABS, Take 0.5 mg by mouth at bedtime. Take immediately before bedtime    Flaxseed, Linseed, (FLAXSEED OIL) 1000 MG CAPS, Take 1,000 mg by mouth daily.    MAGNESIUM CITRATE PO*, Take 2 tablets by mouth daily.   MAGNESIUM LACTATE PO, Take 2 tablets by mouth at bedtime.   niacin 500 MG tablet, Take  500 mg by mouth daily.    Nutritional Supplements (SYTRINOL PO), Take 1 tablet by mouth daily.    ondansetron (ZOFRAN ODT) 4 MG disintegrating tablet, Take 1 tablet (4 mg total) by mouth every 8 (eight) hours as needed for nausea or vomiting.   OVER THE COUNTER MEDICATION, Take 1-2 tablets by mouth 2 (two) times daily. *Nutrient 950 with NAC2* takes 2 tablets in the morning and 1 tablet at lunchtime   OVER THE COUNTER MEDICATION, Take 2 tablets by mouth daily with breakfast. *LV GB Complex*   OVER THE COUNTER MEDICATION, Take 1 tablet by mouth 2 (two) times daily. *Regenemax or Biosil*   OVER THE COUNTER MEDICATION, Take 30 mg by mouth daily. *Borotab*   OVER THE COUNTER MEDICATION, Take 1 capsule by mouth daily. *Lypozyme*   potassium chloride (MICRO-K) 10 MEQ CR capsule, Take 2 tabs by mouth twice daily.   RESTASIS 0.05 % ophthalmic emulsion,    STRONTIUM GLUCONATE-B6-B12-FA PO, Take 2 tablets by mouth daily.    Vitamin D, Ergocalciferol, (DRISDOL) 50000 units CAPS capsule, Take 50,000 Units by mouth every Sunday.   VITAMIN K, PHYTONADIONE, PO, Take 2 tablets by mouth daily.    zinc gluconate 50 MG tablet, Take 50-100 mg by mouth 2 (two) times daily. Takes 100mg  in the morning and 50mg  later in the day  * These medications belong to multiple therapeutic classes and are listed under each applicable group.    Past medical history, social, surgical and family history all reviewed in electronic medical record.  [name] pertanent information unless stated regarding to the chief complaint.   Review of Systems:  [name] headache, visual changes, nausea, vomiting,  diarrhea, constipation, dizziness, abdominal pain, skin rash, fevers, chills, night sweats, weight loss, swollen lymph nodes, body aches, joint swelling,  chest pain, shortness of breath, mood changes.  Positive muscle aches  Objective  Blood pressure 124/66, pulse 88, height 5\' 3"  (1.6 m), weight 117 lb (53.1 kg), SpO2 97 %.    General: [name] apparent distress alert and oriented x3 mood and affect normal, dressed appropriately.  HEENT: Pupils equal, extraocular movements intact  Respiratory: Patient's speak in full sentences and does not appear short of breath  Cardiovascular: [name] lower extremity edema, non tender, [name] erythema  Skin: Warm dry intact with [name] signs of infection or rash on extremities or on axial skeleton.  Abdomen: Soft nontender  Neuro: Cranial nerves II through XII are intact, neurovascularly intact in all extremities with 2+ DTRs and 2+ pulses.  Lymph: [name] lymphadenopathy of posterior or anterior cervical chain or axillae bilaterally.  Gait antalgic MSK:  Non tender with full range of motion and good stability and symmetric strength and tone of shoulders, elbows, wrist,  knee and ankles bilaterally.  Left hip exam shows the patient has a negative Trendelenburg which is an improvement but still has significant antalgic gait.  4-5 strength noted of the hip abductor still noted.  Negative straight leg test.  Decreased range of motion in all planes though.  Still some mild tenderness over the piriformis and gluteal tendon on the left side    Impression and Recommendations:      The above documentation has been reviewed and is accurate and complete Lyndal Pulley, DO       Note: This dictation was prepared with Dragon dictation along with smaller phrase technology. Any transcriptional errors that result from this process are unintentional.

## 2019-01-31 NOTE — Assessment & Plan Note (Signed)
Still underlying arthritic changes.

## 2019-01-31 NOTE — Assessment & Plan Note (Signed)
Patient seems to be doing relatively well.  Still has weakness of the hip though noted.  Discussed with patient in great length about icing regimen.  Patient is to increase activity slowly.  Follow-up with me again in 4 to 8 weeks.

## 2019-02-06 DIAGNOSIS — R7301 Impaired fasting glucose: Secondary | ICD-10-CM | POA: Diagnosis not present

## 2019-02-06 DIAGNOSIS — E639 Nutritional deficiency, unspecified: Secondary | ICD-10-CM | POA: Diagnosis not present

## 2019-02-06 DIAGNOSIS — D6489 Other specified anemias: Secondary | ICD-10-CM | POA: Diagnosis not present

## 2019-02-06 DIAGNOSIS — E271 Primary adrenocortical insufficiency: Secondary | ICD-10-CM | POA: Diagnosis not present

## 2019-02-06 DIAGNOSIS — E063 Autoimmune thyroiditis: Secondary | ICD-10-CM | POA: Diagnosis not present

## 2019-02-06 DIAGNOSIS — R946 Abnormal results of thyroid function studies: Secondary | ICD-10-CM | POA: Diagnosis not present

## 2019-02-06 DIAGNOSIS — M8580 Other specified disorders of bone density and structure, unspecified site: Secondary | ICD-10-CM | POA: Diagnosis not present

## 2019-02-06 DIAGNOSIS — Q782 Osteopetrosis: Secondary | ICD-10-CM | POA: Diagnosis not present

## 2019-02-06 DIAGNOSIS — N951 Menopausal and female climacteric states: Secondary | ICD-10-CM | POA: Diagnosis not present

## 2019-02-06 DIAGNOSIS — N210 Calculus in bladder: Secondary | ICD-10-CM | POA: Diagnosis not present

## 2019-02-06 DIAGNOSIS — E559 Vitamin D deficiency, unspecified: Secondary | ICD-10-CM | POA: Diagnosis not present

## 2019-02-06 DIAGNOSIS — Z170 Estrogen receptor positive status [ER+]: Secondary | ICD-10-CM | POA: Diagnosis not present

## 2019-02-10 ENCOUNTER — Encounter: Payer: Self-pay | Admitting: Family

## 2019-02-12 DIAGNOSIS — D692 Other nonthrombocytopenic purpura: Secondary | ICD-10-CM | POA: Diagnosis not present

## 2019-02-12 DIAGNOSIS — L821 Other seborrheic keratosis: Secondary | ICD-10-CM | POA: Diagnosis not present

## 2019-02-12 DIAGNOSIS — Z85828 Personal history of other malignant neoplasm of skin: Secondary | ICD-10-CM | POA: Diagnosis not present

## 2019-03-13 ENCOUNTER — Ambulatory Visit: Payer: Self-pay | Admitting: Family Medicine

## 2019-03-13 ENCOUNTER — Encounter: Payer: Self-pay | Admitting: Family Medicine

## 2019-03-13 ENCOUNTER — Other Ambulatory Visit: Payer: Self-pay

## 2019-03-13 ENCOUNTER — Other Ambulatory Visit: Payer: PPO

## 2019-03-13 ENCOUNTER — Ambulatory Visit: Payer: Self-pay

## 2019-03-13 VITALS — Ht 63.0 in

## 2019-03-13 DIAGNOSIS — M25551 Pain in right hip: Secondary | ICD-10-CM

## 2019-03-13 NOTE — Patient Instructions (Signed)
Good to see you You know the drill Follow up in 6 weeks

## 2019-03-13 NOTE — Progress Notes (Signed)
[name] [address], [place] [address] Phone: [phone] Subjective:   Christina [name], am serving as a scribe for Dr. Hulan Saas.   CC: Bilateral buttocks pain  [id number]  Christina [name] is a 68 y.o. female coming in with complaint of  Bilateral gluteal pain.  Patient did have a gluteal tear on the left side and did respond fairly well the PRP.  Feels like she has made some improvement but she would like to have a refresher PRP injection.  Right side given her trouble and would like to see if she could split the material.    Past Medical History:  Diagnosis Date   Abrasion of right leg 12/17/2014   Adrenal insufficiency (HCC)    Breast cancer of upper-outer quadrant of left female breast ([place]) 12/07/2014   Cataract, immature    bilateral   Dental bridge present    upper - x 2   Dental crowns present    Hypothyroidism    Personal history of radiation therapy    Prediabetes    Past Surgical History:  Procedure Laterality Date   ABDOMINOPLASTY     BALLOON SINUPLASTY     BREAST LUMPECTOMY Left 2016   BREAST LUMPECTOMY WITH RADIOACTIVE SEED AND SENTINEL LYMPH NODE BIOPSY Left 12/22/2014   Procedure: BREAST LUMPECTOMY WITH RADIOACTIVE SEED AND SENTINEL LYMPH NODE BIOPSY;  Surgeon: Stark Klein, MD;  Location: [facility];  Service: General;  Laterality: Left;   BUNIONECTOMY Right    HAMMER TOE SURGERY Bilateral    RHINOPLASTY     SEPTOPLASTY     Social History   Socioeconomic History   Marital status: Widowed    Spouse name: Not on file   Number of children: Not on file   Years of education: Not on file   Highest education level: Not on file  Occupational History    Employer: [employer] resource strain: Not on file   Food insecurity    Worry: Not on file    Inability: Not on file   Transportation needs    Medical: Not on file    Non-medical: Not  on file  Tobacco Use   Smoking status: Former Smoker    Packs/day: 0.00    Years: 0.00    Pack years: 0.00    Quit date: 05/01/1980    Years since quitting: 38.8   Smokeless tobacco: Never Used  Substance and Sexual Activity   Alcohol use: Yes    Alcohol/week: 0.0 standard drinks    Comment: occasionally   Drug use: [name]   Sexual activity: Not on file  Lifestyle   Physical activity    Days per week: Not on file    Minutes per session: Not on file   Stress: Not on file  Relationships   Social connections    Talks on phone: Not on file    Gets together: Not on file    Attends religious service: Not on file    Active member of club or organization: Not on file    Attends meetings of clubs or organizations: Not on file    Relationship status: Not on file  Other Topics Concern   Not on file  Social History Narrative   Not on file   Allergies  Allergen Reactions   Celery Oil Other (See Comments)    FEVER BLISTERS   Doxycycline Nausea And Vomiting   Rye Grass Flower  Pollen Extract [Gramineae Pollens] Swelling    RYE:  SWELLING THROAT - DENIES SOB   Sulfa Antibiotics Swelling    NOSE   Tea Swelling    THROAT - DENIES SOB   Adhesive [Tape] Other (See Comments)    TEARS SKIN   Family History  Problem Relation Age of Onset   Diabetes Mother    Diabetes Father    Cancer Maternal Uncle        pancreatic cancer    Diabetes Paternal Grandmother    Diabetes Paternal Grandfather     Current Outpatient Medications (Endocrine & Metabolic):    liothyronine (CYTOMEL) 25 MCG tablet, Take 50 mcg by mouth daily.   metFORMIN (GLUCOPHAGE-XR) 500 MG 24 hr tablet, Take 1,000 mg by mouth 2 (two) times daily. Takes 1000mg  in the morning, 500mg  at lunch, and 500mg  with supper   methylPREDNISolone (MEDROL) 4 MG tablet, Take 2.5-4 mg by mouth 3 (three) times daily. Takes 4mg  in the morning and 2.5mg  midday and at bedtime  Current Facility-Administered Medications  (Endocrine & Metabolic):    methylPREDNISolone acetate (DEPO-MEDROL) injection 80 mg    Current Outpatient Medications (Respiratory):    albuterol (PROVENTIL HFA;VENTOLIN HFA) 108 (90 BASE) MCG/ACT inhaler, Inhale into the lungs every 6 (six) hours as needed for wheezing or shortness of breath.   cetirizine (ZYRTEC) 10 MG tablet, Take 10 mg by mouth daily.    fluticasone (FLONASE) 50 MCG/ACT nasal spray, Place 1 spray into both nostrils daily.   guaiFENesin (MUCINEX) 600 MG 12 hr tablet, Take 600 mg by mouth daily.    montelukast (SINGULAIR) 10 MG tablet, Take 10 mg by mouth at bedtime.   Current Outpatient Medications (Analgesics):    ibuprofen (ADVIL,MOTRIN) 200 MG tablet, Take 600 mg by mouth every 4 (four) hours as needed for fever, headache, mild pain, moderate pain or cramping.   oxyCODONE-acetaminophen (ROXICET) 5-325 MG per tablet, Take 1-2 tablets by mouth every 4 (four) hours as needed for severe pain. (Patient taking differently: Take 0.25-1 tablets by mouth every 4 (four) hours as needed for severe pain. )   traMADol (ULTRAM) 50 MG tablet, Take 1 tablet (50 mg total) by mouth every 6 (six) hours as needed.     Current Outpatient Medications (Other):    Alpha Lipoic Acid 200 MG CAPS, Take 600 mg by mouth 2 (two) times daily.    Ascorbic Acid (VITAMIN C) 1000 MG tablet, Take 2,000-3,000 mg by mouth 2 (two) times daily. Takes 2000mg  in the morning and 3000mg  at night   calcium carbonate (OS-CAL) 600 MG TABS tablet, Take 800 mg by mouth 2 (two) times daily with a meal.   clonazePAM (KLONOPIN) 1 MG tablet, Take 1 mg by mouth at bedtime.    Coenzyme Q10 200 MG capsule, Take 200 mg by mouth daily.   cyclobenzaprine (FLEXERIL) 10 MG tablet, Take 1 tablet (10 mg total) by mouth 3 (three) times daily as needed for muscle spasms.   Eszopiclone (ESZOPICLONE) 3 MG TABS, Take 0.5 mg by mouth at bedtime. Take immediately before bedtime    Flaxseed, Linseed, (FLAXSEED OIL)  1000 MG CAPS, Take 1,000 mg by mouth daily.    MAGNESIUM CITRATE PO*, Take 2 tablets by mouth daily.   MAGNESIUM LACTATE PO, Take 2 tablets by mouth at bedtime.   niacin 500 MG tablet, Take 500 mg by mouth daily.    Nutritional Supplements (SYTRINOL PO), Take 1 tablet by mouth daily.    ondansetron (ZOFRAN ODT) 4 MG disintegrating  tablet, Take 1 tablet (4 mg total) by mouth every 8 (eight) hours as needed for nausea or vomiting.   OVER THE COUNTER MEDICATION, Take 1-2 tablets by mouth 2 (two) times daily. *Nutrient 950 with NAC2* takes 2 tablets in the morning and 1 tablet at lunchtime   OVER THE COUNTER MEDICATION, Take 2 tablets by mouth daily with breakfast. *LV GB Complex*   OVER THE COUNTER MEDICATION, Take 1 tablet by mouth 2 (two) times daily. *Regenemax or Biosil*   OVER THE COUNTER MEDICATION, Take 30 mg by mouth daily. *Borotab*   OVER THE COUNTER MEDICATION, Take 1 capsule by mouth daily. *Lypozyme*   potassium chloride (MICRO-K) 10 MEQ CR capsule, Take 2 tabs by mouth twice daily.   RESTASIS 0.05 % ophthalmic emulsion,    STRONTIUM GLUCONATE-B6-B12-FA PO, Take 2 tablets by mouth daily.    Vitamin D, Ergocalciferol, (DRISDOL) 50000 units CAPS capsule, Take 50,000 Units by mouth every Sunday.   VITAMIN K, PHYTONADIONE, PO, Take 2 tablets by mouth daily.    zinc gluconate 50 MG tablet, Take 50-100 mg by mouth 2 (two) times daily. Takes 100mg  in the morning and 50mg  later in the day  * These medications belong to multiple therapeutic classes and are listed under each applicable group.    Past medical history, social, surgical and family history all reviewed in electronic medical record.  [name] pertanent information unless stated regarding to the chief complaint.   Review of Systems:  [name] headache, visual changes, nausea, vomiting, diarrhea, constipation, dizziness, abdominal pain, skin rash, fevers, chills, night sweats, weight loss, swollen lymph nodes, body aches, joint  swelling,chest pain, shortness of breath, mood changes.  Positive muscle aches   Objective  Height 5\' 3"  (1.6 m).    General: [name] apparent distress alert and oriented x3 mood and affect normal, dressed appropriately.  HEENT: Pupils equal, extraocular movements intact  Respiratory: Patient's speak in full sentences and does not appear short of breath  Gait antalgic gait   Procedure: Real-time Ultrasound Guided Injection of  Right gluteal and left gluteal tendon sheath.   Device: GE Logiq Q7 Ultrasound guided injection is preferred based studies that show increased duration, increased effect, greater accuracy, decreased procedural pain, increased response rate, and decreased cost with ultrasound guided versus blind injection.  Verbal informed consent obtained.  Time-out conducted.  Noted [name] overlying erythema, induration, or other signs of local infection.  Skin prepped in a sterile fashion.  Local anesthesia: Topical Ethyl chloride.  With sterile technique and under real time ultrasound guidance:  With a 21 gauge 2 inch needle injected in right and left gluteal tendon with 5 cc of pre-centrifuge PRP 2.5 cc in each  Completed without difficulty  Pain immediately resolved suggesting accurate placement of the medication.  Advised to call if fevers/chills, erythema, induration, drainage, or persistent bleeding.  Images permanently stored and available for review in the ultrasound unit.  Impression: Technically successful ultrasound guided injection. Impression and Recommendations:     This case required medical decision making of moderate complexity. The above documentation has been reviewed and is accurate and complete Lyndal Pulley, DO       Note: This dictation was prepared with Dragon dictation along with smaller phrase technology. Any transcriptional errors that result from this process are unintentional.

## 2019-03-26 ENCOUNTER — Other Ambulatory Visit: Payer: Self-pay

## 2019-04-29 ENCOUNTER — Ambulatory Visit (INDEPENDENT_AMBULATORY_CARE_PROVIDER_SITE_OTHER): Payer: PPO | Admitting: Family Medicine

## 2019-04-29 ENCOUNTER — Encounter: Payer: Self-pay | Admitting: Family Medicine

## 2019-04-29 ENCOUNTER — Other Ambulatory Visit: Payer: Self-pay

## 2019-04-29 ENCOUNTER — Ambulatory Visit (INDEPENDENT_AMBULATORY_CARE_PROVIDER_SITE_OTHER): Payer: PPO

## 2019-04-29 VITALS — BP 146/90 | HR 77 | Ht 63.0 in | Wt 114.0 lb

## 2019-04-29 DIAGNOSIS — M25551 Pain in right hip: Secondary | ICD-10-CM | POA: Diagnosis not present

## 2019-04-29 DIAGNOSIS — M7061 Trochanteric bursitis, right hip: Secondary | ICD-10-CM | POA: Diagnosis not present

## 2019-04-29 DIAGNOSIS — G8929 Other chronic pain: Secondary | ICD-10-CM | POA: Diagnosis not present

## 2019-04-29 NOTE — Progress Notes (Signed)
[address] [place], [place] [address] [phone] Subjective:   I Christina [name] am serving as a Education administrator for Dr. Hulan Saas.  This visit occurred during the SARS-CoV-2 public health emergency.  Safety protocols were in place, including screening questions prior to the visit, additional usage of staff PPE, and extensive cleaning of exam room while observing appropriate contact time as indicated for disinfecting solutions.   I'm seeing this patient by the request  of:    CC: Right  [id number]  [name] C Kornbluth is a 68 y.o. female coming in with complaint of right hip pain. Yesterday it was great. Today the pain is a lot worse. Patient would like an injection. Left is doing well.  Patient states that she is still doing much better than where she was a year ago.  Has increased activity.  Patient feels the PRP has been helpful but is looking for some other aid in comfort at the moment.    Past Medical History:  Diagnosis Date  . Abrasion of right leg 12/17/2014  . Adrenal insufficiency ([place])   . Breast cancer of upper-outer quadrant of left female breast ([place]) 12/07/2014  . Cataract, immature    bilateral  . Dental bridge present    upper - x 2  . Dental crowns present   . Hypothyroidism   . Personal history of radiation therapy   . Prediabetes    Past Surgical History:  Procedure Laterality Date  . ABDOMINOPLASTY    . BALLOON SINUPLASTY    . BREAST LUMPECTOMY Left 2016  . BREAST LUMPECTOMY WITH RADIOACTIVE SEED AND SENTINEL LYMPH NODE BIOPSY Left 12/22/2014   Procedure: BREAST LUMPECTOMY WITH RADIOACTIVE SEED AND SENTINEL LYMPH NODE BIOPSY;  Surgeon: Stark Klein, MD;  Location: [facility];  Service: General;  Laterality: Left;  . BUNIONECTOMY Right   . HAMMER TOE SURGERY Bilateral   . RHINOPLASTY    . SEPTOPLASTY     Social History   Socioeconomic History  . Marital status: Widowed    Spouse name: Not on  file  . Number of children: Not on file  . Years of education: Not on file  . Highest education level: Not on file  Occupational History    Employer: [employer]  Tobacco Use  . Smoking status: Former Smoker    Packs/day: 0.00    Years: 0.00    Pack years: 0.00    Quit date: 05/01/1980    Years since quitting: 39.0  . Smokeless tobacco: Never Used  Substance and Sexual Activity  . Alcohol use: Yes    Alcohol/week: 0.0 standard drinks    Comment: occasionally  . Drug use: No  . Sexual activity: Not on file  Other Topics Concern  . Not on file  Social History Narrative  . Not on file   Social Determinants of Health   Financial Resource Strain:   . Difficulty of Paying Living Expenses: Not on file  Food Insecurity:   . Worried About Charity fundraiser in the Last Year: Not on file  . Ran Out of Food in the Last Year: Not on file  Transportation Needs:   . Lack of Transportation (Medical): Not on file  . Lack of Transportation (Non-Medical): Not on file  Physical Activity:   . Days of Exercise per Week: Not on file  . Minutes of Exercise per Session: Not on file  Stress:   . Feeling of Stress : Not on  file  Social Connections:   . Frequency of Communication with Friends and Family: Not on file  . Frequency of Social Gatherings with Friends and Family: Not on file  . Attends Religious Services: Not on file  . Active Member of Clubs or Organizations: Not on file  . Attends Archivist Meetings: Not on file  . Marital Status: Not on file   Allergies  Allergen Reactions  . Celery Oil Other (See Comments)    FEVER BLISTERS  . Doxycycline Nausea And Vomiting  . Rye Grass Flower Pollen Extract [Gramineae Pollens] Swelling    RYE:  SWELLING THROAT - DENIES SOB  . Sulfa Antibiotics Swelling    NOSE  . Tea Swelling    THROAT - DENIES SOB  . Adhesive [Tape] Other (See Comments)    TEARS SKIN   Family History  Problem Relation Age of Onset  . Diabetes Mother    . Diabetes Father   . Cancer Maternal Uncle        pancreatic cancer   . Diabetes Paternal Grandmother   . Diabetes Paternal Grandfather     Current Outpatient Medications (Endocrine & Metabolic):  .  liothyronine (CYTOMEL) 25 MCG tablet, Take 50 mcg by mouth daily. .  metFORMIN (GLUCOPHAGE-XR) 500 MG 24 hr tablet, Take 1,000 mg by mouth 2 (two) times daily. Takes 1000mg  in the morning, 500mg  at lunch, and 500mg  with supper .  methylPREDNISolone (MEDROL) 4 MG tablet, Take 2.5-4 mg by mouth 3 (three) times daily. Takes 4mg  in the morning and 2.5mg  midday and at bedtime  Current Facility-Administered Medications (Endocrine & Metabolic):  .  methylPREDNISolone acetate (DEPO-MEDROL) injection 80 mg    Current Outpatient Medications (Respiratory):  .  albuterol (PROVENTIL HFA;VENTOLIN HFA) 108 (90 BASE) MCG/ACT inhaler, Inhale into the lungs every 6 (six) hours as needed for wheezing or shortness of breath. .  cetirizine (ZYRTEC) 10 MG tablet, Take 10 mg by mouth daily.  .  fluticasone (FLONASE) 50 MCG/ACT nasal spray, Place 1 spray into both nostrils daily. Marland Kitchen  guaiFENesin (MUCINEX) 600 MG 12 hr tablet, Take 600 mg by mouth daily.  .  montelukast (SINGULAIR) 10 MG tablet, Take 10 mg by mouth at bedtime.   Current Outpatient Medications (Analgesics):  .  ibuprofen (ADVIL,MOTRIN) 200 MG tablet, Take 600 mg by mouth every 4 (four) hours as needed for fever, headache, mild pain, moderate pain or cramping. Marland Kitchen  oxyCODONE-acetaminophen (ROXICET) 5-325 MG per tablet, Take 1-2 tablets by mouth every 4 (four) hours as needed for severe pain. (Patient taking differently: Take 0.25-1 tablets by mouth every 4 (four) hours as needed for severe pain. ) .  traMADol (ULTRAM) 50 MG tablet, Take 1 tablet (50 mg total) by mouth every 6 (six) hours as needed.     Current Outpatient Medications (Other):  Marland Kitchen  Alpha Lipoic Acid 200 MG CAPS, Take 600 mg by mouth 2 (two) times daily.  .  Ascorbic Acid (VITAMIN  C) 1000 MG tablet, Take 2,000-3,000 mg by mouth 2 (two) times daily. Takes 2000mg  in the morning and 3000mg  at night .  calcium carbonate (OS-CAL) 600 MG TABS tablet, Take 800 mg by mouth 2 (two) times daily with a meal. .  clonazePAM (KLONOPIN) 1 MG tablet, Take 1 mg by mouth at bedtime.  .  Coenzyme Q10 200 MG capsule, Take 200 mg by mouth daily. .  cyclobenzaprine (FLEXERIL) 10 MG tablet, Take 1 tablet (10 mg total) by mouth 3 (three) times daily as needed  for muscle spasms. .  Flaxseed, Linseed, (FLAXSEED OIL) 1000 MG CAPS, Take 1,000 mg by mouth daily.  Marland Kitchen  MAGNESIUM CITRATE PO*, Take 2 tablets by mouth daily. Marland Kitchen  MAGNESIUM LACTATE PO, Take 2 tablets by mouth at bedtime. .  niacin 500 MG tablet, Take 500 mg by mouth daily.  .  Nutritional Supplements (SYTRINOL PO), Take 1 tablet by mouth daily.  .  ondansetron (ZOFRAN ODT) 4 MG disintegrating tablet, Take 1 tablet (4 mg total) by mouth every 8 (eight) hours as needed for nausea or vomiting. Marland Kitchen  OVER THE COUNTER MEDICATION, Take 1-2 tablets by mouth 2 (two) times daily. *Nutrient 950 with NAC2* takes 2 tablets in the morning and 1 tablet at lunchtime .  OVER THE COUNTER MEDICATION, Take 2 tablets by mouth daily with breakfast. *LV GB Complex* .  OVER THE COUNTER MEDICATION, Take 1 tablet by mouth 2 (two) times daily. *Regenemax or Biosil* .  OVER THE COUNTER MEDICATION, Take 30 mg by mouth daily. *Borotab* .  OVER THE COUNTER MEDICATION, Take 1 capsule by mouth daily. *Lypozyme* .  potassium chloride (MICRO-K) 10 MEQ CR capsule, Take 2 tabs by mouth twice daily. .  STRONTIUM GLUCONATE-B6-B12-FA PO, Take 2 tablets by mouth daily.  .  Vitamin D, Ergocalciferol, (DRISDOL) 50000 units CAPS capsule, Take 50,000 Units by mouth every Sunday. Marland Kitchen  VITAMIN K, PHYTONADIONE, PO, Take 2 tablets by mouth daily.  Marland Kitchen  zinc gluconate 50 MG tablet, Take 50-100 mg by mouth 2 (two) times daily. Takes 100mg  in the morning and 50mg  later in the day  * These  medications belong to multiple therapeutic classes and are listed under each applicable group.    Past medical history, social, surgical and family history all reviewed in electronic medical record.  No pertanent information unless stated regarding to the chief complaint.   Review of Systems:  No headache, visual changes, nausea, vomiting, diarrhea, constipation, dizziness, abdominal pain, skin rash, fevers, chills, night sweats, weight loss, swollen lymph nodes, body aches, joint swelling, muscle aches, chest pain, shortness of breath, mood changes.   Objective  Blood pressure (!) 146/90, pulse 77, height 5\' 3"  (1.6 m), weight 114 lb (51.7 kg), SpO2 90 %.    General: No apparent distress alert and oriented x3 mood and affect normal, dressed appropriately.  HEENT: Pupils equal, extraocular movements intact  Respiratory: Patient's speak in full sentences and does not appear short of breath  Cardiovascular: No lower extremity edema, non tender, no erythema  Skin: Warm dry intact with no signs of infection or rash on extremities or on axial skeleton.  Abdomen: Soft nontender  Neuro: Cranial nerves II through XII are intact, neurovascularly intact in all extremities with 2+ DTRs and 2+ pulses.  Lymph: No lymphadenopathy of posterior or anterior cervical chain or axillae bilaterally.  Gait antalgic MSK:  tender with full range of motion and good stability and symmetric strength and tone of shoulders, elbows, wrist, , knee and ankles bilaterally.  Moderate arthritic changes of multiple joints Left hip still has significant weakness especially when testing the gluteal area. Patient continues to have some discomfort over the greater trochanteric area on the right side.  More pain there as well as somewhat of the gluteal area.  Negative straight leg test.  Neurovascularly intact distally.   Procedure: Real-time Ultrasound Guided Injection of right greater trochanteric bursitis secondary to  patient's body habitus Device: GE Logiq Q7 Ultrasound guided injection is preferred based studies that show increased duration, increased  effect, greater accuracy, decreased procedural pain, increased response rate, and decreased cost with ultrasound guided versus blind injection.  Verbal informed consent obtained.  Time-out conducted.  Noted no overlying erythema, induration, or other signs of local infection.  Skin prepped in a sterile fashion.  Local anesthesia: Topical Ethyl chloride.  With sterile technique and under real time ultrasound guidance:  Greater trochanteric area was visualized and patient's bursa was noted. A 22-gauge 3 inch needle was inserted and 4 cc of 0.5% Marcaine and 1 cc of Kenalog 40 mg/dL was injected. Pictures taken Completed without difficulty  Pain immediately resolved suggesting accurate placement of the medication.  Advised to call if fevers/chills, erythema, induration, drainage, or persistent bleeding.  Images permanently stored and available for review in the ultrasound unit.  Impression: Technically successful ultrasound guided injection.   Impression and Recommendations:     This case required medical decision making of moderate complexity. The above documentation has been reviewed and is accurate and complete Lyndal Pulley, DO       Note: This dictation was prepared with Dragon dictation along with smaller phrase technology. Any transcriptional errors that result from this process are unintentional.

## 2019-04-29 NOTE — Assessment & Plan Note (Signed)
Repeat injection tomorrow, tolerated the procedure well, discussed which activity do which wants to avoid.  Patient is considering the possibility of PRP of the right gluteal area because patient is having increasing discomfort.  Feels that the left side though still is doing remarkably well.  Patient will follow up with me again in 4 to 8 weeks

## 2019-04-29 NOTE — Patient Instructions (Signed)
Good to see you See me again in 3-4 weeks for PRP

## 2019-05-05 ENCOUNTER — Encounter: Payer: Self-pay | Admitting: Family Medicine

## 2019-05-19 ENCOUNTER — Ambulatory Visit (INDEPENDENT_AMBULATORY_CARE_PROVIDER_SITE_OTHER): Payer: PPO | Admitting: Family

## 2019-05-19 ENCOUNTER — Other Ambulatory Visit: Payer: Self-pay

## 2019-05-19 ENCOUNTER — Encounter: Payer: Self-pay | Admitting: Family

## 2019-05-19 VITALS — BP 126/78 | HR 77 | Temp 98.0°F | Ht 63.0 in | Wt 109.4 lb

## 2019-05-19 DIAGNOSIS — C50412 Malignant neoplasm of upper-outer quadrant of left female breast: Secondary | ICD-10-CM

## 2019-05-19 NOTE — Patient Instructions (Signed)
Please go to [email or web address] and that link will walk you through getting on the wait list/ schedule

## 2019-05-19 NOTE — Progress Notes (Signed)
Christina [name] is a 69 y.o. female with the following history as recorded in Epic[facility]:  Patient Active Problem List   Diagnosis Date Noted  . Tear of left gluteus medius tendon 01/04/2019  . Arthritis of left hip 03/20/2018  . Left hip pain 02/26/2018  . Greater trochanteric bursitis of right hip 06/13/2017  . 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 Breast cancer of upper-outer quadrant of left female breast ([place]) 12/07/2014  . Acute recurrent sinusitis 01/17/2013    Current Outpatient Medications  Medication Sig Dispense Refill  . cycloSPORINE (RESTASIS) 0.05 % ophthalmic emulsion Place 1 drop into both eyes 2 (two) times daily.    . prednisoLONE acetate (PRED FORTE) 1 % ophthalmic suspension PLACE 1 DROP INTO OPERATIVE EYE 4 TIMES A DAY (LEFT EYE)    . albuterol (PROVENTIL HFA;VENTOLIN HFA) 108 (90 BASE) MCG/ACT inhaler Inhale into the lungs every 6 (six) hours as needed for wheezing or shortness of breath.    Ilean Skill Lipoic Acid 200 MG CAPS Take 600 mg by mouth 2 (two) times daily.     . Ascorbic Acid (VITAMIN C) 1000 MG tablet Take 2,000-3,000 mg by mouth 2 (two) times daily. Takes 2000mg  in the morning and 3000mg  at night    . calcium carbonate (OS-CAL) 600 MG TABS tablet Take 800 mg by mouth 2 (two) times daily with a meal.    . cetirizine (ZYRTEC) 10 MG tablet Take 10 mg by mouth daily.     . clonazePAM (KLONOPIN) 1 MG tablet Take 1 mg by mouth at bedtime.     . Coenzyme Q10 200 MG capsule Take 200 mg by mouth daily.    . cyclobenzaprine (FLEXERIL) 10 MG tablet Take 1 tablet (10 mg total) by mouth 3 (three) times daily as needed for muscle spasms. 90 tablet 0  . Flaxseed, Linseed, (FLAXSEED OIL) 1000 MG CAPS Take 1,000 mg by mouth daily.     . fluticasone (FLONASE) 50 MCG/ACT nasal spray Place 1 spray into both nostrils daily.  11  . guaiFENesin (MUCINEX) 600 MG 12 hr tablet Take 600 mg by mouth daily.     Marland Kitchen ibuprofen (ADVIL,MOTRIN) 200 MG tablet Take 600 mg by  mouth every 4 (four) hours as needed for fever, headache, mild pain, moderate pain or cramping.    Marland Kitchen liothyronine (CYTOMEL) 25 MCG tablet Take 50 mcg by mouth daily.    Marland Kitchen MAGNESIUM CITRATE PO Take 2 tablets by mouth daily.    . Magnesium Citrate POWD Take by mouth.    Marland Kitchen MAGNESIUM LACTATE PO Take 2 tablets by mouth at bedtime.    . metFORMIN (GLUCOPHAGE-XR) 500 MG 24 hr tablet Take 1,000 mg by mouth 2 (two) times daily. Takes 1000mg  in the morning, 500mg  at lunch, and 500mg  with supper  1  . methylPREDNISolone (MEDROL) 4 MG tablet Take 2.5-4 mg by mouth 3 (three) times daily. Takes 4mg  in the morning and 2.5mg  midday and at bedtime    . montelukast (SINGULAIR) 10 MG tablet Take 10 mg by mouth at bedtime.    . moxifloxacin (VIGAMOX) 0.5 % ophthalmic solution Place 1 drop into the right eye 4 (four) times daily.    . niacin 500 MG tablet Take 500 mg by mouth daily.     . Nutritional Supplements (SYTRINOL PO) Take 1 tablet by mouth daily.     . ondansetron (ZOFRAN ODT) 4 MG disintegrating tablet Take 1 tablet (4 mg total) by mouth every 8 (  eight) hours as needed for nausea or vomiting. 12 tablet 0  . OVER THE COUNTER MEDICATION Take 1-2 tablets by mouth 2 (two) times daily. *Nutrient 950 with NAC2* takes 2 tablets in the morning and 1 tablet at lunchtime    . OVER THE COUNTER MEDICATION Take 2 tablets by mouth daily with breakfast. *LV GB Complex*    . OVER THE COUNTER MEDICATION Take 1 tablet by mouth 2 (two) times daily. *Regenemax or Biosil*    . OVER THE COUNTER MEDICATION Take 30 mg by mouth daily. *Borotab*    . OVER THE COUNTER MEDICATION Take 1 capsule by mouth daily. *Lypozyme*    . oxyCODONE-acetaminophen (ROXICET) 5-325 MG per tablet Take 1-2 tablets by mouth every 4 (four) hours as needed for severe pain. (Patient taking differently: Take 0.25-1 tablets by mouth every 4 (four) hours as needed for severe pain. ) 30 tablet 0  . Phytonadione (VITAMIN K1) POWD Take by mouth.    . potassium  chloride (MICRO-K) 10 MEQ CR capsule Take 2 tabs by mouth twice daily.    . STRONTIUM GLUCONATE-B6-B12-FA PO Take 2 tablets by mouth daily.     . traMADol (ULTRAM) 50 MG tablet Take 1 tablet (50 mg total) by mouth every 6 (six) hours as needed. 20 tablet 0  . valACYclovir (VALTREX) 1000 MG tablet TAKE 2 TABLETS EVERY MORNING AND TAKE 2 TABLETS EVERY EVENING FOR 1 DAY AS NEEDED FOR FLARE    . Vitamin D, Ergocalciferol, (DRISDOL) 50000 units CAPS capsule Take 50,000 Units by mouth every Sunday.    Marland Kitchen VITAMIN K, PHYTONADIONE, PO Take 2 tablets by mouth daily.     Marland Kitchen zinc gluconate 50 MG tablet Take 50-100 mg by mouth 2 (two) times daily. Takes 100mg  in the morning and 50mg  later in the day     Current Facility-Administered Medications  Medication Dose Route Frequency Provider Last Rate Last Admin  . methylPREDNISolone acetate (DEPO-MEDROL) injection 80 mg  80 mg Other Once Magnus Sinning, MD        Allergies: Celery oil, Doxycycline, Rye grass flower pollen extract [gramineae pollens], Sulfa antibiotics, Tea, and Adhesive [tape]  Past Medical History:  Diagnosis Date  . Abrasion of right leg 12/17/2014  . Adrenal insufficiency ([place])   . Breast cancer of upper-outer quadrant of left female breast ([place]) 12/07/2014  . Cataract, immature    bilateral  . Dental bridge present    upper - x 2  . Dental crowns present   . Hypothyroidism   . Personal history of radiation therapy   . Prediabetes     Past Surgical History:  Procedure Laterality Date  . ABDOMINOPLASTY    . BALLOON SINUPLASTY    . BREAST LUMPECTOMY Left 2016  . BREAST LUMPECTOMY WITH RADIOACTIVE SEED AND SENTINEL LYMPH NODE BIOPSY Left 12/22/2014   Procedure: BREAST LUMPECTOMY WITH RADIOACTIVE SEED AND SENTINEL LYMPH NODE BIOPSY;  Surgeon: Stark Klein, MD;  Location: [facility];  Service: General;  Laterality: Left;  . BUNIONECTOMY Right   . HAMMER TOE SURGERY Bilateral   . RHINOPLASTY    . SEPTOPLASTY      Family  History  Problem Relation Age of Onset  . Diabetes Mother   . Diabetes Father   . Cancer Maternal Uncle        pancreatic cancer   . Diabetes Paternal Grandmother   . Diabetes Paternal Grandfather     Social History   Tobacco Use  . Smoking status: Former Smoker  Packs/day: 0.00    Years: 0.00    Pack years: 0.00    Quit date: 05/01/1980    Years since quitting: 39.0  . Smokeless tobacco: Never Used  Substance Use Topics  . Alcohol use: Yes    Alcohol/week: 0.0 standard drinks    Comment: occasionally    Subjective:   Requesting breast exam today; has majority of her healthcare needs managed by her Integrative Provider; notes that Integrative Provider will not do breast exams or pap smears for her; is up to date on mammogram; history of breast cancer- sees Dr. Barry Dienes yearly for follow-up as well;   Continuing to work with Dr. Tamala Julian in sports medicine here for follow up of her chronic hip pain; scheduled for PRP injections later this week     Objective:  Vitals:   05/19/19 1112  BP: 126/78  Pulse: 77  Temp: 98 F (36.7 C)  TempSrc: Oral  SpO2: 95%  Weight: 109 lb 6.4 oz (49.6 kg)  Height: 5\' 3"  (1.6 m)    General: Well developed, well nourished, in no acute distress  Skin : Warm and dry.  Head: Normocephalic and atraumatic  Lungs: Respirations unlabored;  CVS exam: normal rate and regular rhythm.  Neurologic: Alert and oriented; speech intact; face symmetrical; moves all extremities well; CNII-XII intact without focal deficit   Assessment:  1. Malignant neoplasm of upper-outer quadrant of left female breast, unspecified estrogen receptor status ([place])     Plan:  Patient in remission- has no concerns; recent mammogram was normal; breast exam updated per patient request; she will continue with her Integrative MD for management of her healthcare needs and plan to see her breast surgeon later this summer for mammogram/ follow-up there.   This visit occurred during  the SARS-CoV-2 public health emergency.  Safety protocols were in place, including screening questions prior to the visit, additional usage of staff PPE, and extensive cleaning of exam room while observing appropriate contact time as indicated for disinfecting solutions.    No follow-ups on file.  No orders of the defined types were placed in this encounter.   Requested Prescriptions    No prescriptions requested or ordered in this encounter

## 2019-05-22 ENCOUNTER — Ambulatory Visit: Payer: Self-pay | Admitting: Family Medicine

## 2019-05-22 ENCOUNTER — Ambulatory Visit (INDEPENDENT_AMBULATORY_CARE_PROVIDER_SITE_OTHER): Payer: PPO

## 2019-05-22 ENCOUNTER — Other Ambulatory Visit: Payer: Self-pay

## 2019-05-22 ENCOUNTER — Encounter: Payer: Self-pay | Admitting: Family Medicine

## 2019-05-22 VITALS — BP 140/70 | Ht 63.0 in | Wt 115.0 lb

## 2019-05-22 DIAGNOSIS — M25551 Pain in right hip: Secondary | ICD-10-CM | POA: Diagnosis not present

## 2019-05-22 DIAGNOSIS — S76012D Strain of muscle, fascia and tendon of left hip, subsequent encounter: Secondary | ICD-10-CM

## 2019-05-22 DIAGNOSIS — M7601 Gluteal tendinitis, right hip: Secondary | ICD-10-CM | POA: Insufficient documentation

## 2019-05-22 DIAGNOSIS — M25552 Pain in left hip: Secondary | ICD-10-CM | POA: Diagnosis not present

## 2019-05-22 NOTE — Assessment & Plan Note (Signed)
Injection given today, tolerated the procedure well, discussed icing regimen and home exercise, discussed avoiding certain activities.  Patient is to increase activity slowly.  Follow-up again in 4 to 8 weeks

## 2019-05-22 NOTE — Progress Notes (Signed)
[place] [address] [place] Phone: [phone] Subjective:   I Christina [name] am serving as a Education administrator for Dr. Hulan Saas.  This visit occurred during the SARS-CoV-2 public health emergency.  Safety protocols were in place, including screening questions prior to the visit, additional usage of staff PPE, and extensive cleaning of exam room while observing appropriate contact time as indicated for disinfecting solutions.   I'm seeing this patient by the request  of:  Marrian Salvage, FNP  CC: Back pain follow-up  [id number]   04/29/2019 Repeat injection tomorrow, tolerated the procedure well, discussed which activity do which wants to avoid.  Patient is considering the possibility of PRP of the right gluteal area because patient is having increasing discomfort.  Feels that the left side though still is doing remarkably well.  Patient will follow up with me again in 4 to 8 weeks  05/22/2019 Christina [name] is a 69 y.o. female coming in with complaint of hip pain. Would like another injection. Has switched orthotics to address hip pain.  Patient feels that the PRP has been the most beneficial.  Would like bilateral injections at this time.     Past Medical History:  Diagnosis Date  . Abrasion of right leg 12/17/2014  . Adrenal insufficiency ([place])   . Breast cancer of upper-outer quadrant of left female breast ([place]) 12/07/2014  . Cataract, immature    bilateral  . Dental bridge present    upper - x 2  . Dental crowns present   . Hypothyroidism   . Personal history of radiation therapy   . Prediabetes    Past Surgical History:  Procedure Laterality Date  . ABDOMINOPLASTY    . BALLOON SINUPLASTY    . BREAST LUMPECTOMY Left 2016  . BREAST LUMPECTOMY WITH RADIOACTIVE SEED AND SENTINEL LYMPH NODE BIOPSY Left 12/22/2014   Procedure: BREAST LUMPECTOMY WITH RADIOACTIVE SEED AND SENTINEL LYMPH NODE BIOPSY;  Surgeon: Stark Klein, MD;  Location: [facility];  Service: General;  Laterality: Left;  . BUNIONECTOMY Right   . HAMMER TOE SURGERY Bilateral   . RHINOPLASTY    . SEPTOPLASTY     Social History   Socioeconomic History  . Marital status: Widowed    Spouse name: Not on file  . Number of children: Not on file  . Years of education: Not on file  . Highest education level: Not on file  Occupational History    Employer: [employer]  Tobacco Use  . Smoking status: Former Smoker    Packs/day: 0.00    Years: 0.00    Pack years: 0.00    Quit date: 05/01/1980    Years since quitting: 39.0  . Smokeless tobacco: Never Used  Substance and Sexual Activity  . Alcohol use: Yes    Alcohol/week: 0.0 standard drinks    Comment: occasionally  . Drug use: No  . Sexual activity: Not on file  Other Topics Concern  . Not on file  Social History Narrative  . Not on file   Social Determinants of Health   Financial Resource Strain:   . Difficulty of Paying Living Expenses: Not on file  Food Insecurity:   . Worried About Charity fundraiser in the Last Year: Not on file  . Ran Out of Food in the Last Year: Not on file  Transportation Needs:   . Lack of Transportation (Medical): Not on file  . Lack of Transportation (Non-Medical):  Not on file  Physical Activity:   . Days of Exercise per Week: Not on file  . Minutes of Exercise per Session: Not on file  Stress:   . Feeling of Stress : Not on file  Social Connections:   . Frequency of Communication with Friends and Family: Not on file  . Frequency of Social Gatherings with Friends and Family: Not on file  . Attends Religious Services: Not on file  . Active Member of Clubs or Organizations: Not on file  . Attends Archivist Meetings: Not on file  . Marital Status: Not on file   Allergies  Allergen Reactions  . Celery Oil Other (See Comments)    FEVER BLISTERS  . Doxycycline Nausea And Vomiting  . Rye Grass Flower Pollen  Extract [Gramineae Pollens] Swelling    RYE:  SWELLING THROAT - DENIES SOB  . Sulfa Antibiotics Swelling    NOSE  . Tea Swelling    THROAT - DENIES SOB  . Adhesive [Tape] Other (See Comments)    TEARS SKIN   Family History  Problem Relation Age of Onset  . Diabetes Mother   . Diabetes Father   . Cancer Maternal Uncle        pancreatic cancer   . Diabetes Paternal Grandmother   . Diabetes Paternal Grandfather     Current Outpatient Medications (Endocrine & Metabolic):  .  liothyronine (CYTOMEL) 25 MCG tablet, Take 50 mcg by mouth daily. .  metFORMIN (GLUCOPHAGE-XR) 500 MG 24 hr tablet, Take 1,000 mg by mouth 2 (two) times daily. Takes 1000mg  in the morning, 500mg  at lunch, and 500mg  with supper .  methylPREDNISolone (MEDROL) 4 MG tablet, Take 2.5-4 mg by mouth 3 (three) times daily. Takes 4mg  in the morning and 2.5mg  midday and at bedtime  Current Facility-Administered Medications (Endocrine & Metabolic):  .  methylPREDNISolone acetate (DEPO-MEDROL) injection 80 mg    Current Outpatient Medications (Respiratory):  .  albuterol (PROVENTIL HFA;VENTOLIN HFA) 108 (90 BASE) MCG/ACT inhaler, Inhale into the lungs every 6 (six) hours as needed for wheezing or shortness of breath. .  cetirizine (ZYRTEC) 10 MG tablet, Take 10 mg by mouth daily.  .  fluticasone (FLONASE) 50 MCG/ACT nasal spray, Place 1 spray into both nostrils daily. Marland Kitchen  guaiFENesin (MUCINEX) 600 MG 12 hr tablet, Take 600 mg by mouth daily.  .  montelukast (SINGULAIR) 10 MG tablet, Take 10 mg by mouth at bedtime.   Current Outpatient Medications (Analgesics):  .  ibuprofen (ADVIL,MOTRIN) 200 MG tablet, Take 600 mg by mouth every 4 (four) hours as needed for fever, headache, mild pain, moderate pain or cramping. Marland Kitchen  oxyCODONE-acetaminophen (ROXICET) 5-325 MG per tablet, Take 1-2 tablets by mouth every 4 (four) hours as needed for severe pain. (Patient taking differently: Take 0.25-1 tablets by mouth every 4 (four) hours as  needed for severe pain. ) .  traMADol (ULTRAM) 50 MG tablet, Take 1 tablet (50 mg total) by mouth every 6 (six) hours as needed.     Current Outpatient Medications (Other):  Marland Kitchen  Alpha Lipoic Acid 200 MG CAPS, Take 600 mg by mouth 2 (two) times daily.  .  Ascorbic Acid (VITAMIN C) 1000 MG tablet, Take 2,000-3,000 mg by mouth 2 (two) times daily. Takes 2000mg  in the morning and 3000mg  at night .  calcium carbonate (OS-CAL) 600 MG TABS tablet, Take 800 mg by mouth 2 (two) times daily with a meal. .  clonazePAM (KLONOPIN) 1 MG tablet, Take 1 mg by  mouth at bedtime.  .  Coenzyme Q10 200 MG capsule, Take 200 mg by mouth daily. .  cyclobenzaprine (FLEXERIL) 10 MG tablet, Take 1 tablet (10 mg total) by mouth 3 (three) times daily as needed for muscle spasms. .  cycloSPORINE (RESTASIS) 0.05 % ophthalmic emulsion, Place 1 drop into both eyes 2 (two) times daily. .  Flaxseed, Linseed, (FLAXSEED OIL) 1000 MG CAPS, Take 1,000 mg by mouth daily.  Marland Kitchen  MAGNESIUM CITRATE PO*, Take 2 tablets by mouth daily. .  Magnesium Citrate POWD, Take by mouth. Marland Kitchen  MAGNESIUM LACTATE PO, Take 2 tablets by mouth at bedtime. .  moxifloxacin (VIGAMOX) 0.5 % ophthalmic solution, Place 1 drop into the right eye 4 (four) times daily. .  niacin 500 MG tablet, Take 500 mg by mouth daily.  .  Nutritional Supplements (SYTRINOL PO), Take 1 tablet by mouth daily.  .  ondansetron (ZOFRAN ODT) 4 MG disintegrating tablet, Take 1 tablet (4 mg total) by mouth every 8 (eight) hours as needed for nausea or vomiting. Marland Kitchen  OVER THE COUNTER MEDICATION, Take 1-2 tablets by mouth 2 (two) times daily. *Nutrient 950 with NAC2* takes 2 tablets in the morning and 1 tablet at lunchtime .  OVER THE COUNTER MEDICATION, Take 2 tablets by mouth daily with breakfast. *LV GB Complex* .  OVER THE COUNTER MEDICATION, Take 1 tablet by mouth 2 (two) times daily. *Regenemax or Biosil* .  OVER THE COUNTER MEDICATION, Take 30 mg by mouth daily. *Borotab* .  OVER THE  COUNTER MEDICATION, Take 1 capsule by mouth daily. *Lypozyme* .  Phytonadione (VITAMIN K1) POWD, Take by mouth. .  potassium chloride (MICRO-K) 10 MEQ CR capsule, Take 2 tabs by mouth twice daily. .  prednisoLONE acetate (PRED FORTE) 1 % ophthalmic suspension, PLACE 1 DROP INTO OPERATIVE EYE 4 TIMES A DAY (LEFT EYE) .  STRONTIUM GLUCONATE-B6-B12-FA PO, Take 2 tablets by mouth daily.  .  valACYclovir (VALTREX) 1000 MG tablet, TAKE 2 TABLETS EVERY MORNING AND TAKE 2 TABLETS EVERY EVENING FOR 1 DAY AS NEEDED FOR FLARE .  Vitamin D, Ergocalciferol, (DRISDOL) 50000 units CAPS capsule, Take 50,000 Units by mouth every Sunday. Marland Kitchen  VITAMIN K, PHYTONADIONE, PO, Take 2 tablets by mouth daily.  Marland Kitchen  zinc gluconate 50 MG tablet, Take 50-100 mg by mouth 2 (two) times daily. Takes 100mg  in the morning and 50mg  later in the day  * These medications belong to multiple therapeutic classes and are listed under each applicable group.    Past medical history, social, surgical and family history all reviewed in electronic medical record.  No pertanent information unless stated regarding to the chief complaint.   Review of Systems:  No headache, visual changes, nausea, vomiting, diarrhea, constipation, dizziness, abdominal pain, skin rash, fevers, chills, night sweats, weight loss, swollen lymph nodes, body aches, joint swelling, chest pain, shortness of breath, mood changes. POSITIVE muscle aches  Objective  Blood pressure 140/70, height 5\' 3"  (1.6 m), weight 115 lb (52.2 kg).   General: No apparent distress alert and oriented x3 mood and affect normal, dressed appropriately.  Continues have weakness of more of the right hip to the left hip.  Positive Trendelenburg.  Patient is severely tender to palpation over the gluteal tendons bilaterally.  External rotation with walking on the left hip noted.  Procedure: Real-time Ultrasound Guided Injection of left gluteal tendon Device: GE Logiq Q7 Ultrasound guided  injection is preferred based studies that show increased duration, increased effect, greater accuracy, decreased procedural  pain, increased response rate, and decreased cost with ultrasound guided versus blind injection.  Verbal informed consent obtained.  Time-out conducted.  Noted no overlying erythema, induration, or other signs of local infection.  Skin prepped in a sterile fashion.  Local anesthesia: Topical Ethyl chloride.  With sterile technique and under real time ultrasound guidance: With a 21-gauge 2 inch needle injected with 1 cc of 0.5% Marcaine and then 5 cc in precentrifuge PRP Completed without difficulty  Pain immediately resolved suggesting accurate placement of the medication.  Advised to call if fevers/chills, erythema, induration, drainage, or persistent bleeding.  Images permanently stored and available for review in the ultrasound unit.  Impression: Technically successful ultrasound guided injection.  Procedure: Real-time Ultrasound Guided Injection of right gluteal tendon Device: GE Logiq Q7 Ultrasound guided injection is preferred based studies that show increased duration, increased effect, greater accuracy, decreased procedural pain, increased response rate, and decreased cost with ultrasound guided versus blind injection.  Verbal informed consent obtained.  Time-out conducted.  Noted no overlying erythema, induration, or other signs of local infection.  Skin prepped in a sterile fashion.  Local anesthesia: Topical Ethyl chloride.  With sterile technique and under real time ultrasound guidance: With a 21-gauge 2 inch needle injected with 1 cc of 0.5% Marcaine and then 5 cc of present diffuse PRP Completed without difficulty  Pain immediately resolved suggesting accurate placement of the medication.  Advised to call if fevers/chills, erythema, induration, drainage, or persistent bleeding.  Images permanently stored and available for review in the ultrasound unit.    Impression: Technically successful ultrasound guided injection.    Impression and Recommendations:     This case required medical decision making of moderate complexity. The above documentation has been reviewed and is accurate and complete Lyndal Pulley, DO       Note: This dictation was prepared with Dragon dictation along with smaller phrase technology. Any transcriptional errors that result from this process are unintentional.

## 2019-05-22 NOTE — Assessment & Plan Note (Signed)
Patient given injection today and tolerated the procedure well, discussed icing regimen and home exercise, which activities of doing which wants to avoid.  Follow-up 4 to 8 weeks

## 2019-05-26 DIAGNOSIS — D692 Other nonthrombocytopenic purpura: Secondary | ICD-10-CM | POA: Diagnosis not present

## 2019-05-26 DIAGNOSIS — Z85828 Personal history of other malignant neoplasm of skin: Secondary | ICD-10-CM | POA: Diagnosis not present

## 2019-05-26 DIAGNOSIS — L570 Actinic keratosis: Secondary | ICD-10-CM | POA: Diagnosis not present

## 2019-05-26 DIAGNOSIS — L738 Other specified follicular disorders: Secondary | ICD-10-CM | POA: Diagnosis not present

## 2019-05-26 DIAGNOSIS — L821 Other seborrheic keratosis: Secondary | ICD-10-CM | POA: Diagnosis not present

## 2019-05-28 ENCOUNTER — Encounter: Payer: Self-pay | Admitting: Family

## 2019-05-29 ENCOUNTER — Encounter: Payer: Self-pay | Admitting: Family

## 2019-05-29 DIAGNOSIS — R7301 Impaired fasting glucose: Secondary | ICD-10-CM | POA: Diagnosis not present

## 2019-05-29 DIAGNOSIS — E063 Autoimmune thyroiditis: Secondary | ICD-10-CM | POA: Diagnosis not present

## 2019-05-29 DIAGNOSIS — E782 Mixed hyperlipidemia: Secondary | ICD-10-CM | POA: Diagnosis not present

## 2019-07-02 ENCOUNTER — Other Ambulatory Visit: Payer: Self-pay

## 2019-07-02 ENCOUNTER — Other Ambulatory Visit: Payer: Self-pay | Admitting: Family Medicine

## 2019-07-02 ENCOUNTER — Ambulatory Visit (INDEPENDENT_AMBULATORY_CARE_PROVIDER_SITE_OTHER): Payer: PPO

## 2019-07-02 ENCOUNTER — Ambulatory Visit: Payer: PPO | Admitting: Family Medicine

## 2019-07-02 ENCOUNTER — Encounter: Payer: Self-pay | Admitting: Family Medicine

## 2019-07-02 VITALS — BP 122/78 | Ht 63.0 in | Wt 115.0 lb

## 2019-07-02 DIAGNOSIS — M25551 Pain in right hip: Secondary | ICD-10-CM | POA: Diagnosis not present

## 2019-07-02 DIAGNOSIS — M7061 Trochanteric bursitis, right hip: Secondary | ICD-10-CM

## 2019-07-02 DIAGNOSIS — S79911A Unspecified injury of right hip, initial encounter: Secondary | ICD-10-CM | POA: Diagnosis not present

## 2019-07-02 NOTE — Assessment & Plan Note (Addendum)
Repeat injection given today, tolerated the procedure well, discussed icing regimen and home exercise, I do believe that this is a chronic problem exacerbated from the fall.  Differential includes a lumbar radiculopathy.  Nonpalpable but this does make a benefit.  Patient is concerned if she is actually healing.  Due to the fall we will also get x-ray rule out a greater trochanteric fracture

## 2019-07-02 NOTE — Progress Notes (Signed)
[facility] Phone: [phone] Subjective:   Christina [name], am serving as a scribe for Dr. Hulan Saas. This visit occurred during the SARS-CoV-2 public health emergency.  Safety protocols were in place, including screening questions prior to the visit, additional usage of staff PPE, and extensive cleaning of exam room while observing appropriate contact time as indicated for disinfecting solutions.    I'm seeing this patient by the request  of:  Marrian Salvage, FNP  CC: Left hip follow-up new onset of right hip pain  [id number]   05/22/2019 Injection given today, tolerated the procedu table re well, discussed icing regimen and home exercise, discussed avoiding certain activities.  Patient is to increase activity slowly.  Follow-up again in 4 to 8 weeks  Patient given injection today and tolerated the procedure well, discussed icing regimen and home exercise, which activities of doing which wants to avoid.  Follow-up 4 to 8 weeks  Update 07/02/2019 Christina [name] is a 69 y.o. female coming in with complaint of right glute tendonitis. Slipped 2 weeks ago in the grass and had immediate pain in the glute. Pain has not subsided since slipping. Using Tylenol and Advil. Using Tylenol and Advil and Tramadol prn.  Patient did have an abrasion on the side of the leg.  He felt like that was from something else.  Patient feels that from time to time.  Seems to be almost a weak and unstable.  Past Medical History:  Diagnosis Date  . Abrasion of right leg 12/17/2014  . Adrenal insufficiency ([place])   . Breast cancer of upper-outer quadrant of left female breast ([place]) 12/07/2014  . Cataract, immature    bilateral  . Dental bridge present    upper - x 2  . Dental crowns present   . Hypothyroidism   . Personal history of radiation therapy   . Prediabetes    Past Surgical History:  Procedure Laterality Date  .  ABDOMINOPLASTY    . BALLOON SINUPLASTY    . BREAST LUMPECTOMY Left 2016  . BREAST LUMPECTOMY WITH RADIOACTIVE SEED AND SENTINEL LYMPH NODE BIOPSY Left 12/22/2014   Procedure: BREAST LUMPECTOMY WITH RADIOACTIVE SEED AND SENTINEL LYMPH NODE BIOPSY;  Surgeon: Stark Klein, MD;  Location: [facility];  Service: General;  Laterality: Left;  . BUNIONECTOMY Right   . HAMMER TOE SURGERY Bilateral   . RHINOPLASTY    . SEPTOPLASTY     Social History   Socioeconomic History  . Marital status: Widowed    Spouse name: Not on file  . Number of children: Not on file  . Years of education: Not on file  . Highest education level: Not on file  Occupational History    Employer: [employer]  Tobacco Use  . Smoking status: Former Smoker    Packs/day: 0.00    Years: 0.00    Pack years: 0.00    Quit date: 05/01/1980    Years since quitting: 39.1  . Smokeless tobacco: Never Used  Substance and Sexual Activity  . Alcohol use: Yes    Alcohol/week: 0.0 standard drinks    Comment: occasionally  . Drug use: [name]  . Sexual activity: Not on file  Other Topics Concern  . Not on file  Social History Narrative  . Not on file   Social Determinants of Health   Financial Resource Strain:   . Difficulty of Paying Living Expenses: Not on file  Food  Insecurity:   . Worried About Charity fundraiser in the Last Year: Not on file  . Ran Out of Food in the Last Year: Not on file  Transportation Needs:   . Lack of Transportation (Medical): Not on file  . Lack of Transportation (Non-Medical): Not on file  Physical Activity:   . Days of Exercise per Week: Not on file  . Minutes of Exercise per Session: Not on file  Stress:   . Feeling of Stress : Not on file  Social Connections:   . Frequency of Communication with Friends and Family: Not on file  . Frequency of Social Gatherings with Friends and Family: Not on file  . Attends Religious Services: Not on file  . Active Member of Clubs or  Organizations: Not on file  . Attends Archivist Meetings: Not on file  . Marital Status: Not on file   Allergies  Allergen Reactions  . Celery Oil Other (See Comments)    FEVER BLISTERS  . Doxycycline Nausea And Vomiting  . Rye Grass Flower Pollen Extract [Gramineae Pollens] Swelling    RYE:  SWELLING THROAT - DENIES SOB  . Sulfa Antibiotics Swelling    NOSE  . Tea Swelling    THROAT - DENIES SOB  . Adhesive [Tape] Other (See Comments)    TEARS SKIN   Family History  Problem Relation Age of Onset  . Diabetes Mother   . Diabetes Father   . Cancer Maternal Uncle        pancreatic cancer   . Diabetes Paternal Grandmother   . Diabetes Paternal Grandfather     Current Outpatient Medications (Endocrine & Metabolic):  .  liothyronine (CYTOMEL) 25 MCG tablet, Take 50 mcg by mouth daily. .  metFORMIN (GLUCOPHAGE-XR) 500 MG 24 hr tablet, Take 1,000 mg by mouth 2 (two) times daily. Takes 1000mg  in the morning, 500mg  at lunch, and 500mg  with supper .  methylPREDNISolone (MEDROL) 4 MG tablet, Take 2.5-4 mg by mouth 3 (three) times daily. Takes 4mg  in the morning and 2.5mg  midday and at bedtime  Current Facility-Administered Medications (Endocrine & Metabolic):  .  methylPREDNISolone acetate (DEPO-MEDROL) injection 80 mg    Current Outpatient Medications (Respiratory):  .  albuterol (PROVENTIL HFA;VENTOLIN HFA) 108 (90 BASE) MCG/ACT inhaler, Inhale into the lungs every 6 (six) hours as needed for wheezing or shortness of breath. .  cetirizine (ZYRTEC) 10 MG tablet, Take 10 mg by mouth daily.  .  fluticasone (FLONASE) 50 MCG/ACT nasal spray, Place 1 spray into both nostrils daily. Marland Kitchen  guaiFENesin (MUCINEX) 600 MG 12 hr tablet, Take 600 mg by mouth daily.  .  montelukast (SINGULAIR) 10 MG tablet, Take 10 mg by mouth at bedtime.   Current Outpatient Medications (Analgesics):  .  ibuprofen (ADVIL,MOTRIN) 200 MG tablet, Take 600 mg by mouth every 4 (four) hours as needed for  fever, headache, mild pain, moderate pain or cramping. Marland Kitchen  oxyCODONE-acetaminophen (ROXICET) 5-325 MG per tablet, Take 1-2 tablets by mouth every 4 (four) hours as needed for severe pain. (Patient taking differently: Take 0.25-1 tablets by mouth every 4 (four) hours as needed for severe pain. ) .  traMADol (ULTRAM) 50 MG tablet, Take 1 tablet (50 mg total) by mouth every 6 (six) hours as needed.     Current Outpatient Medications (Other):  Marland Kitchen  Alpha Lipoic Acid 200 MG CAPS, Take 600 mg by mouth 2 (two) times daily.  .  Ascorbic Acid (VITAMIN C) 1000 MG tablet, Take  2,000-3,000 mg by mouth 2 (two) times daily. Takes 2000mg  in the morning and 3000mg  at night .  calcium carbonate (OS-CAL) 600 MG TABS tablet, Take 800 mg by mouth 2 (two) times daily with a meal. .  clonazePAM (KLONOPIN) 1 MG tablet, Take 1 mg by mouth at bedtime.  .  Coenzyme Q10 200 MG capsule, Take 200 mg by mouth daily. .  cyclobenzaprine (FLEXERIL) 10 MG tablet, Take 1 tablet (10 mg total) by mouth 3 (three) times daily as needed for muscle spasms. .  cycloSPORINE (RESTASIS) 0.05 % ophthalmic emulsion, Place 1 drop into both eyes 2 (two) times daily. .  Flaxseed, Linseed, (FLAXSEED OIL) 1000 MG CAPS, Take 1,000 mg by mouth daily.  Marland Kitchen  MAGNESIUM CITRATE PO*, Take 2 tablets by mouth daily. .  Magnesium Citrate POWD, Take by mouth. Marland Kitchen  MAGNESIUM LACTATE PO, Take 2 tablets by mouth at bedtime. .  moxifloxacin (VIGAMOX) 0.5 % ophthalmic solution, Place 1 drop into the right eye 4 (four) times daily. .  niacin 500 MG tablet, Take 500 mg by mouth daily.  .  Nutritional Supplements (SYTRINOL PO), Take 1 tablet by mouth daily.  .  ondansetron (ZOFRAN ODT) 4 MG disintegrating tablet, Take 1 tablet (4 mg total) by mouth every 8 (eight) hours as needed for nausea or vomiting. Marland Kitchen  OVER THE COUNTER MEDICATION, Take 1-2 tablets by mouth 2 (two) times daily. *Nutrient 950 with NAC2* takes 2 tablets in the morning and 1 tablet at lunchtime .  OVER  THE COUNTER MEDICATION, Take 2 tablets by mouth daily with breakfast. *LV GB Complex* .  OVER THE COUNTER MEDICATION, Take 1 tablet by mouth 2 (two) times daily. *Regenemax or Biosil* .  OVER THE COUNTER MEDICATION, Take 30 mg by mouth daily. *Borotab* .  OVER THE COUNTER MEDICATION, Take 1 capsule by mouth daily. *Lypozyme* .  Phytonadione (VITAMIN K1) POWD, Take by mouth. .  potassium chloride (MICRO-K) 10 MEQ CR capsule, Take 2 tabs by mouth twice daily. .  prednisoLONE acetate (PRED FORTE) 1 % ophthalmic suspension, PLACE 1 DROP INTO OPERATIVE EYE 4 TIMES A DAY (LEFT EYE) .  STRONTIUM GLUCONATE-B6-B12-FA PO, Take 2 tablets by mouth daily.  .  valACYclovir (VALTREX) 1000 MG tablet, TAKE 2 TABLETS EVERY MORNING AND TAKE 2 TABLETS EVERY EVENING FOR 1 DAY AS NEEDED FOR FLARE .  Vitamin D, Ergocalciferol, (DRISDOL) 50000 units CAPS capsule, Take 50,000 Units by mouth every Sunday. Marland Kitchen  VITAMIN K, PHYTONADIONE, PO, Take 2 tablets by mouth daily.  Marland Kitchen  zinc gluconate 50 MG tablet, Take 50-100 mg by mouth 2 (two) times daily. Takes 100mg  in the morning and 50mg  later in the day  * These medications belong to multiple therapeutic classes and are listed under each applicable group.   Reviewed prior external information including notes and imaging from  primary care provider As well as notes that were available from care everywhere and other healthcare systems.  Past medical history, social, surgical and family history all reviewed in electronic medical record.  [name] pertanent information unless stated regarding to the chief complaint.   Review of Systems:  [name] headache, visual changes, nausea, vomiting, diarrhea, constipation, dizziness, abdominal pain, skin rash, fevers, chills, night sweats, weight loss, swollen lymph nodes, , joint swelling, chest pain, shortness of breath, mood changes. POSITIVE muscle aches, body aches  Objective  Blood pressure 122/78, height 5\' 3"  (1.6 m), weight 115 lb (52.2  kg).   General: [name] apparent distress alert and oriented  x3 mood and affect normal, dressed appropriately.  Underweight HEENT: Pupils equal, extraocular movements intact  Respiratory: Patient's speak in full sentences and does not appear short of breath  Cardiovascular: [name] lower extremity edema, non tender, [name] erythema  Skin: Warm dry intact with [name] signs of infection or rash on extremities or on axial skeleton.  Abdomen: Soft nontender  Neuro: Cranial nerves II through XII are intact, neurovascularly intact in all extremities with 2+ DTRs and 2+ pulses.  Lymph: [name] lymphadenopathy of posterior or anterior cervical chain or axillae bilaterally.  Gait antalgic with external rotation of the hips bilaterally MSK: Left gluteal tendon seems less tender than previous exam.  He still has decreased range of motion with internal range of motion of the hip. Severely tender to palpation over the greater trochanteric area of the right hip.  Minimal over the gluteal area.  Negative straight leg test but worsening pain in the back with extension.  4+ out of 5 strength in lower extremities but symmetric.    Procedure: Real-time Ultrasound Guided Injection of right greater trochanteric bursitis secondary to patient's body habitus Device: GE Logiq Q7 Ultrasound guided injection is preferred based studies that show increased duration, increased effect, greater accuracy, decreased procedural pain, increased response rate, and decreased cost with ultrasound guided versus blind injection.  Verbal informed consent obtained.  Time-out conducted.  Noted [name] overlying erythema, induration, or other signs of local infection.  Skin prepped in a sterile fashion.  Local anesthesia: Topical Ethyl chloride.  With sterile technique and under real time ultrasound guidance:  Greater trochanteric area was visualized and patient's bursa was noted. A 22-gauge 3 inch needle was inserted and 4 cc of 0.5% Marcaine and 1 cc of Kenalog  40 mg/dL was injected. Pictures taken Completed without difficulty  Pain immediately resolved suggesting accurate placement of the medication.  Advised to call if fevers/chills, erythema, induration, drainage, or persistent bleeding.  Images permanently stored and available for review in the ultrasound unit.  Impression: Technically successful ultrasound guided injection.   Impression and Recommendations:     This case required medical decision making of moderate complexity. The above documentation has been reviewed and is accurate and complete Lyndal Pulley, DO       Note: This dictation was prepared with Dragon dictation along with smaller phrase technology. Any transcriptional errors that result from this process are unintentional.

## 2019-07-02 NOTE — Patient Instructions (Signed)
Arnica lotion See me again in 5-6 weeks

## 2019-07-14 ENCOUNTER — Ambulatory Visit (INDEPENDENT_AMBULATORY_CARE_PROVIDER_SITE_OTHER): Payer: PPO | Admitting: Family Medicine

## 2019-07-14 ENCOUNTER — Encounter: Payer: Self-pay | Admitting: Family Medicine

## 2019-07-14 ENCOUNTER — Other Ambulatory Visit: Payer: Self-pay

## 2019-07-14 VITALS — BP 126/84 | HR 68 | Ht 63.0 in | Wt 113.0 lb

## 2019-07-14 DIAGNOSIS — M7601 Gluteal tendinitis, right hip: Secondary | ICD-10-CM | POA: Diagnosis not present

## 2019-07-14 DIAGNOSIS — M25551 Pain in right hip: Secondary | ICD-10-CM | POA: Diagnosis not present

## 2019-07-14 DIAGNOSIS — M7061 Trochanteric bursitis, right hip: Secondary | ICD-10-CM

## 2019-07-14 NOTE — Progress Notes (Signed)
Rito Ehrlich, am serving as a Education administrator for Dr. Lynne Leader.  Christina [name] is a 69 y.o. female who presents to [place] at [facility] today for f/u R lateral hip and thigh pain after slipping on the grass.  She was last seen by Dr. Tamala Julian on 07/02/19 and had a R STEROID GT injection and advised to use Arnica lotion.  She has been taking Tylenol, Advil and Tramadol.  Since her last visit, pt reports injection helped for a day the pain is constat and aching patient has been limping and is worried about falling all the time.  She had a PRP injection to the right gluteus medius tendon in January which was working reasonably well until she slipped and fell again.  She notes her pain in her buttocks is of noxious interfering with her quality of life.  She cannot walk normally nor exercise normally nor ride her horse.  She would like to proceed with more definitive evaluation of possible.  Diagnostic testing: R hip XR- 07/02/19    Pertinent review of systems: No fevers or chills.  Relevant historical information: History of breast cancer   Exam:  BP 126/84 (BP Location: Right Arm, Patient Position: Sitting, Cuff Size: Normal)   Pulse 68   Ht 5\' 3"  (1.6 m)   Wt 113 lb (51.3 kg)   SpO2 99%   BMI 20.02 kg/m  General: Well Developed, well nourished, and in no acute distress.   MSK: Right hip normal-appearing Normal motion. Tender palpation overlying greater trochanter and hip abductor tendon insertion sites. Hip abduction strength is diminished 3+/5.  External rotation strength is also diminished 4/5. Pain with resisted abduction and external rotation. Adduction and internal rotation strength is normal without pain.  Cap refill and sensation are intact distally.    Lab and Radiology Results EXAM: DG HIP (WITH OR WITHOUT PELVIS) 2-3V RIGHT  COMPARISON:  None.  FINDINGS: There is no evidence of hip fracture or dislocation. There is no evidence of arthropathy  or other focal bone abnormality.  IMPRESSION: Negative.   Electronically Signed   By: Marijo Conception M.D.   On: 07/02/2019 15:51. I, Lynne Leader, personally (independently) visualized and performed the interpretation of the images attached in this note.     Assessment and Plan: 69 y.o. female with right hip pain.  Patient has hip pain consistent with gluteus medius tendinopathy or tear.  She is absolutely failing conservative management with trials of physician directed conservative management greater than 6 weeks including injection with steroids, PRP, home exercise program, and in the past physical therapy trial.  Plan for MRI right hip without contrast for potential injection or surgical planning.  Recheck back with myself or Dr. Tamala Julian in the near future following MRI.   PDMP not reviewed this encounter. Orders Placed This Encounter  Procedures  . MR HIP RIGHT WO CONTRAST    Standing Status:   Future    Standing Expiration Date:   09/12/2020    Order Specific Question:   ** REASON FOR EXAM (FREE TEXT)    Answer:   Suspect Glut med or piriformis tear    Order Specific Question:   What is the patient's sedation requirement?    Answer:   No Sedation    Order Specific Question:   Does the patient have a pacemaker or implanted devices?    Answer:   No    Order Specific Question:   Preferred imaging location?  Answer:   Product/process development scientist (table limit-350lbs)    Order Specific Question:   Radiology Contrast Protocol - do NOT remove file path    Answer:   [email or web address]   No orders of the defined types were placed in this encounter.    Discussed warning signs or symptoms. Please see discharge instructions. Patient expresses understanding.   The above documentation has been reviewed and is accurate and complete Lynne Leader

## 2019-07-14 NOTE — Patient Instructions (Signed)
Thank you for coming in today. Plan for MRI hip.  Follow up with me after MRI.

## 2019-07-27 ENCOUNTER — Other Ambulatory Visit: Payer: PPO

## 2019-08-03 ENCOUNTER — Ambulatory Visit (INDEPENDENT_AMBULATORY_CARE_PROVIDER_SITE_OTHER): Payer: PPO

## 2019-08-03 ENCOUNTER — Other Ambulatory Visit: Payer: Self-pay

## 2019-08-03 DIAGNOSIS — M25551 Pain in right hip: Secondary | ICD-10-CM

## 2019-08-03 DIAGNOSIS — M7061 Trochanteric bursitis, right hip: Secondary | ICD-10-CM

## 2019-08-04 NOTE — Progress Notes (Signed)
MRI hip shows mild to moderate bilateral hip arthritis but fortunately no stress fracture or avascular necrosis (AVN).  The tissue surrounding the tendon and the hip looks a little irritated.  In the report this is called "peritendinosis."  You can discuss this further during your follow-up visit with Dr. Tamala Julian on the seventh.

## 2019-08-06 ENCOUNTER — Other Ambulatory Visit: Payer: Self-pay

## 2019-08-06 ENCOUNTER — Encounter: Payer: Self-pay | Admitting: Family Medicine

## 2019-08-06 ENCOUNTER — Ambulatory Visit: Payer: PPO | Admitting: Family Medicine

## 2019-08-06 DIAGNOSIS — S73191A Other sprain of right hip, initial encounter: Secondary | ICD-10-CM | POA: Diagnosis not present

## 2019-08-06 NOTE — Progress Notes (Signed)
Christina [name] Phone: [phone] Subjective:   Christina [name], am serving as a scribe for Dr. Hulan Saas. This visit occurred during the SARS-CoV-2 public health emergency.  Safety protocols were in place, including screening questions prior to the visit, additional usage of staff PPE, and extensive cleaning of exam room while observing appropriate contact time as indicated for disinfecting solutions.   I'm seeing this patient by the request  of:  Marrian Salvage, FNP  CC: Hip pain follow-up  [id number]   07/02/2019 Repeat injection given today, tolerated the procedure well, discussed icing regimen and home exercise, I do believe that this is a chronic problem exacerbated from the fall.  Differential includes a lumbar radiculopathy.  Nonpalpable but this does make a benefit.  Patient is concerned if she is actually healing.  Due to the fall we will also get x-ray rule out a greater trochanteric fracture patient's x-rays were fairly unremarkable but MRI was done.  Update 08/06/2019 Christina [name] is a 69 y.o. female coming in with complaint of right hip pain. MRI performed on 08/03/2019. Constant pain from right hip to knee. Is here for MRI results.   MRI IMPRESSION 1. Mild to moderate bilateral hip joint degenerative changes but [name] stress fracture or AVN. 2. Degenerated and torn superior labrum. 3. [name] significant intrapelvic abnormalities.  Patient's MRI did show a labral tear. Continues to have what she feels is almost instability of the leg. States that a compression sleeve on the thigh has been helpful though. Trying to avoid anti-inflammatories regularly.    Past Medical History:  Diagnosis Date  . Abrasion of right leg 12/17/2014  . Adrenal insufficiency ([place])   . Breast cancer of upper-outer quadrant of left female breast ([place]) 12/07/2014  . Cataract, immature    bilateral  . Dental bridge present     upper - x 2  . Dental crowns present   . Hypothyroidism   . Personal history of radiation therapy   . Prediabetes    Past Surgical History:  Procedure Laterality Date  . ABDOMINOPLASTY    . BALLOON SINUPLASTY    . BREAST LUMPECTOMY Left 2016  . BREAST LUMPECTOMY WITH RADIOACTIVE SEED AND SENTINEL LYMPH NODE BIOPSY Left 12/22/2014   Procedure: BREAST LUMPECTOMY WITH RADIOACTIVE SEED AND SENTINEL LYMPH NODE BIOPSY;  Surgeon: Stark Klein, MD;  Location: [facility];  Service: General;  Laterality: Left;  . BUNIONECTOMY Right   . HAMMER TOE SURGERY Bilateral   . RHINOPLASTY    . SEPTOPLASTY     Social History   Socioeconomic History  . Marital status: Widowed    Spouse name: Not on file  . Number of children: Not on file  . Years of education: Not on file  . Highest education level: Not on file  Occupational History    Employer: [employer]  Tobacco Use  . Smoking status: Former Smoker    Packs/day: 0.00    Years: 0.00    Pack years: 0.00    Quit date: 05/01/1980    Years since quitting: 39.2  . Smokeless tobacco: Never Used  Substance and Sexual Activity  . Alcohol use: Yes    Alcohol/week: 0.0 standard drinks    Comment: occasionally  . Drug use: [name]  . Sexual activity: Not on file  Other Topics Concern  . Not on file  Social History Narrative  . Not on file   Social Determinants  of Health   Financial Resource Strain:   . Difficulty of Paying Living Expenses:   Food Insecurity:   . Worried About Charity fundraiser in the Last Year:   . Arboriculturist in the Last Year:   Transportation Needs:   . Film/video editor (Medical):   Marland Kitchen Lack of Transportation (Non-Medical):   Physical Activity:   . Days of Exercise per Week:   . Minutes of Exercise per Session:   Stress:   . Feeling of Stress :   Social Connections:   . Frequency of Communication with Friends and Family:   . Frequency of Social Gatherings with Friends and Family:   .  Attends Religious Services:   . Active Member of Clubs or Organizations:   . Attends Archivist Meetings:   Marland Kitchen Marital Status:    Allergies  Allergen Reactions  . Celery Oil Other (See Comments)    FEVER BLISTERS  . Doxycycline Nausea And Vomiting  . Rye Grass Flower Pollen Extract [Gramineae Pollens] Swelling    RYE:  SWELLING THROAT - DENIES SOB  . Sulfa Antibiotics Swelling    NOSE  . Tea Swelling    THROAT - DENIES SOB  . Adhesive [Tape] Other (See Comments)    TEARS SKIN   Family History  Problem Relation Age of Onset  . Diabetes Mother   . Diabetes Father   . Cancer Maternal Uncle        pancreatic cancer   . Diabetes Paternal Grandmother   . Diabetes Paternal Grandfather     Current Outpatient Medications (Endocrine & Metabolic):  .  liothyronine (CYTOMEL) 25 MCG tablet, Take 50 mcg by mouth daily. .  metFORMIN (GLUCOPHAGE-XR) 500 MG 24 hr tablet, Take 1,000 mg by mouth 2 (two) times daily. Takes 1000mg  in the morning, 500mg  at lunch, and 500mg  with supper .  methylPREDNISolone (MEDROL) 4 MG tablet, Take 2.5-4 mg by mouth 3 (three) times daily. Takes 4mg  in the morning and 2.5mg  midday and at bedtime  Current Facility-Administered Medications (Endocrine & Metabolic):  .  methylPREDNISolone acetate (DEPO-MEDROL) injection 80 mg    Current Outpatient Medications (Respiratory):  .  albuterol (PROVENTIL HFA;VENTOLIN HFA) 108 (90 BASE) MCG/ACT inhaler, Inhale into the lungs every 6 (six) hours as needed for wheezing or shortness of breath. .  cetirizine (ZYRTEC) 10 MG tablet, Take 10 mg by mouth daily.  .  fluticasone (FLONASE) 50 MCG/ACT nasal spray, Place 1 spray into both nostrils daily. Marland Kitchen  guaiFENesin (MUCINEX) 600 MG 12 hr tablet, Take 600 mg by mouth daily.  .  montelukast (SINGULAIR) 10 MG tablet, Take 10 mg by mouth at bedtime.   Current Outpatient Medications (Analgesics):  .  ibuprofen (ADVIL,MOTRIN) 200 MG tablet, Take 600 mg by mouth every 4  (four) hours as needed for fever, headache, mild pain, moderate pain or cramping. Marland Kitchen  oxyCODONE-acetaminophen (ROXICET) 5-325 MG per tablet, Take 1-2 tablets by mouth every 4 (four) hours as needed for severe pain. (Patient taking differently: Take 0.25-1 tablets by mouth every 4 (four) hours as needed for severe pain. ) .  traMADol (ULTRAM) 50 MG tablet, Take 1 tablet (50 mg total) by mouth every 6 (six) hours as needed.     Current Outpatient Medications (Other):  Marland Kitchen  Alpha Lipoic Acid 200 MG CAPS, Take 600 mg by mouth 2 (two) times daily.  .  Ascorbic Acid (VITAMIN C) 1000 MG tablet, Take 2,000-3,000 mg by mouth 2 (two) times daily.  Takes 2000mg  in the morning and 3000mg  at night .  calcium carbonate (OS-CAL) 600 MG TABS tablet, Take 800 mg by mouth 2 (two) times daily with a meal. .  clonazePAM (KLONOPIN) 1 MG tablet, Take 1 mg by mouth at bedtime.  .  Coenzyme Q10 200 MG capsule, Take 200 mg by mouth daily. .  cyclobenzaprine (FLEXERIL) 10 MG tablet, Take 1 tablet (10 mg total) by mouth 3 (three) times daily as needed for muscle spasms. .  cycloSPORINE (RESTASIS) 0.05 % ophthalmic emulsion, Place 1 drop into both eyes 2 (two) times daily. .  Flaxseed, Linseed, (FLAXSEED OIL) 1000 MG CAPS, Take 1,000 mg by mouth daily.  Marland Kitchen  MAGNESIUM CITRATE PO*, Take 2 tablets by mouth daily. .  Magnesium Citrate POWD, Take by mouth. Marland Kitchen  MAGNESIUM LACTATE PO, Take 2 tablets by mouth at bedtime. .  moxifloxacin (VIGAMOX) 0.5 % ophthalmic solution, Place 1 drop into the right eye 4 (four) times daily. .  niacin 500 MG tablet, Take 500 mg by mouth daily.  .  Nutritional Supplements (SYTRINOL PO), Take 1 tablet by mouth daily.  .  ondansetron (ZOFRAN ODT) 4 MG disintegrating tablet, Take 1 tablet (4 mg total) by mouth every 8 (eight) hours as needed for nausea or vomiting. Marland Kitchen  OVER THE COUNTER MEDICATION, Take 1-2 tablets by mouth 2 (two) times daily. *Nutrient 950 with NAC2* takes 2 tablets in the morning and 1  tablet at lunchtime .  OVER THE COUNTER MEDICATION, Take 2 tablets by mouth daily with breakfast. *LV GB Complex* .  OVER THE COUNTER MEDICATION, Take 1 tablet by mouth 2 (two) times daily. *Regenemax or Biosil* .  OVER THE COUNTER MEDICATION, Take 30 mg by mouth daily. *Borotab* .  OVER THE COUNTER MEDICATION, Take 1 capsule by mouth daily. *Lypozyme* .  Phytonadione (VITAMIN K1) POWD, Take by mouth. .  potassium chloride (MICRO-K) 10 MEQ CR capsule, Take 2 tabs by mouth twice daily. .  prednisoLONE acetate (PRED FORTE) 1 % ophthalmic suspension, PLACE 1 DROP INTO OPERATIVE EYE 4 TIMES A DAY (LEFT EYE) .  STRONTIUM GLUCONATE-B6-B12-FA PO, Take 2 tablets by mouth daily.  .  valACYclovir (VALTREX) 1000 MG tablet, TAKE 2 TABLETS EVERY MORNING AND TAKE 2 TABLETS EVERY EVENING FOR 1 DAY AS NEEDED FOR FLARE .  Vitamin D, Ergocalciferol, (DRISDOL) 50000 units CAPS capsule, Take 50,000 Units by mouth every Sunday. Marland Kitchen  VITAMIN K, PHYTONADIONE, PO, Take 2 tablets by mouth daily.  Marland Kitchen  zinc gluconate 50 MG tablet, Take 50-100 mg by mouth 2 (two) times daily. Takes 100mg  in the morning and 50mg  later in the day  * These medications belong to multiple therapeutic classes and are listed under each applicable group.   Reviewed prior external information including notes and imaging from  primary care provider As well as notes that were available from care everywhere and other healthcare systems.  Past medical history, social, surgical and family history all reviewed in electronic medical record.  [name] pertanent information unless stated regarding to the chief complaint.   Review of Systems:  [name] headache, visual changes, nausea, vomiting, diarrhea, constipation, dizziness, abdominal pain, skin rash, fevers, chills, night sweats, weight loss, swollen lymph nodes, body aches, joint swelling, chest pain, shortness of breath, mood changes. POSITIVE muscle aches  Objective  Blood pressure 112/82, pulse 75, height  5\' 3"  (1.6 m), weight 108 lb (49 kg), SpO2 99 %.   General: [name] apparent distress alert and oriented x3 mood and affect  normal, dressed appropriately.  HEENT: Pupils equal, extraocular movements intact  Respiratory: Patient's speak in full sentences and does not appear short of breath  Cardiovascular: [name] lower extremity edema, non tender, [name] erythema  Neuro: Cranial nerves II through XII are intact, neurovascularly intact in all extremities with 2+ DTRs and 2+ pulses.  Gait severely antalgic Back exam still significant tenderness to palpation.  Patient's right hip positive grind test noted. Increased pain with internal rotation. Negative straight leg test. In 4-5 strength symmetric to the contralateral side  Osteopathic findings C4 flexed rotated and side bent left T3 extended rotated and side bent right inhaled third rib T5 extended rotated and side bent left L2 flexed rotated and side bent right Sacrum right on right     Impression and Recommendations:     This case required medical decision making of moderate complexity. The above documentation has been reviewed and is accurate and complete Lyndal Pulley, DO       Note: This dictation was prepared with Dragon dictation along with smaller phrase technology. Any transcriptional errors that result from this process are unintentional.

## 2019-08-06 NOTE — Assessment & Plan Note (Addendum)
Mild arthritic changes with a labral tear noted of the superior labrum.  Likely recent.  We discussed with patient about different treatment options.  We have elected to try to have patient go see other specialist for her contralateral hip who also had arthritis and labral tear.  They decided to do more conservative therapy.  Patient would like to try PRP which she did get some relief with on the contralateral side.  Patient will try this and come back and see me again in 4 to 6 weeks reviewing patient's chart as well as discussing different medical options with patient total time with patient today 35 minutes

## 2019-08-13 ENCOUNTER — Ambulatory Visit (INDEPENDENT_AMBULATORY_CARE_PROVIDER_SITE_OTHER): Payer: PPO | Admitting: Family Medicine

## 2019-08-13 ENCOUNTER — Other Ambulatory Visit: Payer: Self-pay

## 2019-08-13 ENCOUNTER — Encounter: Payer: Self-pay | Admitting: Family Medicine

## 2019-08-13 ENCOUNTER — Ambulatory Visit (INDEPENDENT_AMBULATORY_CARE_PROVIDER_SITE_OTHER): Payer: PPO

## 2019-08-13 VITALS — BP 112/74 | HR 83 | Ht 63.0 in | Wt 108.0 lb

## 2019-08-13 DIAGNOSIS — M25551 Pain in right hip: Secondary | ICD-10-CM | POA: Diagnosis not present

## 2019-08-13 DIAGNOSIS — M7601 Gluteal tendinitis, right hip: Secondary | ICD-10-CM

## 2019-08-13 NOTE — Assessment & Plan Note (Signed)
Patient given injection of PRP here.  Did do relatively well in the contralateral side.  Was concern for more of the labral pathology and was planning on doing an injection of the hip with patient decided that she felt this was more where her pain was localized.  We will consider the possibility of the injection for the labrum as well later on.

## 2019-08-13 NOTE — Patient Instructions (Addendum)
No ice or IBU for 3 days See me again in 5 weeks

## 2019-08-13 NOTE — Progress Notes (Signed)
[place] [facility] [place] Phone: [phone] Subjective:   Christina [name], am serving as a scribe for Dr. Hulan Saas. This visit occurred during the SARS-CoV-2 public health emergency.  Safety protocols were in place, including screening questions prior to the visit, additional usage of staff PPE, and extensive cleaning of exam room while observing appropriate contact time as indicated for disinfecting solutions.   I'm seeing this patient by the request  of:  Marrian Salvage, FNP  CC: Right hip pain  [id number]   08/06/2019 Mild arthritic changes with a labral tear noted of the superior labrum.  Likely recent.  We discussed with patient about different treatment options.  We have elected to try to have patient go see other specialist for her contralateral hip who also had arthritis and labral tear.  They decided to do more conservative therapy.  Patient would like to try PRP which she did get some relief with on the contralateral side.  Patient will try this and come back and see me again in 4 to 6 weeks reviewing patient's chart as well as discussing different medical options with patient total time with patient today 35 minutes  Update 08/13/2019 Christina [name] is a 69 y.o. female coming in with complaint of right hip pain. Patient states that she continues to have right hip pain.  Patient was having more pain and seemed to have more of a labral pathology of the right hip.  Does have some gluteal tendinitis as well noted on MRI.  Patient is elected to try PRP and is here for the injections today     Past Medical History:  Diagnosis Date  . Abrasion of right leg 12/17/2014  . Adrenal insufficiency ([place])   . Breast cancer of upper-outer quadrant of left female breast ([place]) 12/07/2014  . Cataract, immature    bilateral  . Dental bridge present    upper - x 2  . Dental crowns present   . Hypothyroidism   . Personal  history of radiation therapy   . Prediabetes    Past Surgical History:  Procedure Laterality Date  . ABDOMINOPLASTY    . BALLOON SINUPLASTY    . BREAST LUMPECTOMY Left 2016  . BREAST LUMPECTOMY WITH RADIOACTIVE SEED AND SENTINEL LYMPH NODE BIOPSY Left 12/22/2014   Procedure: BREAST LUMPECTOMY WITH RADIOACTIVE SEED AND SENTINEL LYMPH NODE BIOPSY;  Surgeon: Stark Klein, MD;  Location: [facility];  Service: General;  Laterality: Left;  . BUNIONECTOMY Right   . HAMMER TOE SURGERY Bilateral   . RHINOPLASTY    . SEPTOPLASTY     Social History   Socioeconomic History  . Marital status: Widowed    Spouse name: Not on file  . Number of children: Not on file  . Years of education: Not on file  . Highest education level: Not on file  Occupational History    Employer: [employer]  Tobacco Use  . Smoking status: Former Smoker    Packs/day: 0.00    Years: 0.00    Pack years: 0.00    Quit date: 05/01/1980    Years since quitting: 39.3  . Smokeless tobacco: Never Used  Substance and Sexual Activity  . Alcohol use: Yes    Alcohol/week: 0.0 standard drinks    Comment: occasionally  . Drug use: [name]  . Sexual activity: Not on file  Other Topics Concern  . Not on file  Social History Narrative  .  Not on file   Social Determinants of Health   Financial Resource Strain:   . Difficulty of Paying Living Expenses:   Food Insecurity:   . Worried About Charity fundraiser in the Last Year:   . Arboriculturist in the Last Year:   Transportation Needs:   . Film/video editor (Medical):   Marland Kitchen Lack of Transportation (Non-Medical):   Physical Activity:   . Days of Exercise per Week:   . Minutes of Exercise per Session:   Stress:   . Feeling of Stress :   Social Connections:   . Frequency of Communication with Friends and Family:   . Frequency of Social Gatherings with Friends and Family:   . Attends Religious Services:   . Active Member of Clubs or Organizations:   .  Attends Archivist Meetings:   Marland Kitchen Marital Status:    Allergies  Allergen Reactions  . Celery Oil Other (See Comments)    FEVER BLISTERS  . Doxycycline Nausea And Vomiting  . Rye Grass Flower Pollen Extract [Gramineae Pollens] Swelling    RYE:  SWELLING THROAT - DENIES SOB  . Sulfa Antibiotics Swelling    NOSE  . Tea Swelling    THROAT - DENIES SOB  . Adhesive [Tape] Other (See Comments)    TEARS SKIN   Family History  Problem Relation Age of Onset  . Diabetes Mother   . Diabetes Father   . Cancer Maternal Uncle        pancreatic cancer   . Diabetes Paternal Grandmother   . Diabetes Paternal Grandfather     Current Outpatient Medications (Endocrine & Metabolic):  .  liothyronine (CYTOMEL) 25 MCG tablet, Take 50 mcg by mouth daily. .  metFORMIN (GLUCOPHAGE-XR) 500 MG 24 hr tablet, Take 1,000 mg by mouth 2 (two) times daily. Takes 1000mg  in the morning, 500mg  at lunch, and 500mg  with supper .  methylPREDNISolone (MEDROL) 4 MG tablet, Take 2.5-4 mg by mouth 3 (three) times daily. Takes 4mg  in the morning and 2.5mg  midday and at bedtime  Current Facility-Administered Medications (Endocrine & Metabolic):  .  methylPREDNISolone acetate (DEPO-MEDROL) injection 80 mg    Current Outpatient Medications (Respiratory):  .  albuterol (PROVENTIL HFA;VENTOLIN HFA) 108 (90 BASE) MCG/ACT inhaler, Inhale into the lungs every 6 (six) hours as needed for wheezing or shortness of breath. .  cetirizine (ZYRTEC) 10 MG tablet, Take 10 mg by mouth daily.  .  fluticasone (FLONASE) 50 MCG/ACT nasal spray, Place 1 spray into both nostrils daily. Marland Kitchen  guaiFENesin (MUCINEX) 600 MG 12 hr tablet, Take 600 mg by mouth daily.  .  montelukast (SINGULAIR) 10 MG tablet, Take 10 mg by mouth at bedtime.   Current Outpatient Medications (Analgesics):  .  ibuprofen (ADVIL,MOTRIN) 200 MG tablet, Take 600 mg by mouth every 4 (four) hours as needed for fever, headache, mild pain, moderate pain or  cramping. Marland Kitchen  oxyCODONE-acetaminophen (ROXICET) 5-325 MG per tablet, Take 1-2 tablets by mouth every 4 (four) hours as needed for severe pain. (Patient taking differently: Take 0.25-1 tablets by mouth every 4 (four) hours as needed for severe pain. ) .  traMADol (ULTRAM) 50 MG tablet, Take 1 tablet (50 mg total) by mouth every 6 (six) hours as needed.     Current Outpatient Medications (Other):  Marland Kitchen  Alpha Lipoic Acid 200 MG CAPS, Take 600 mg by mouth 2 (two) times daily.  .  Ascorbic Acid (VITAMIN C) 1000 MG tablet, Take 2,000-3,000  mg by mouth 2 (two) times daily. Takes 2000mg  in the morning and 3000mg  at night .  calcium carbonate (OS-CAL) 600 MG TABS tablet, Take 800 mg by mouth 2 (two) times daily with a meal. .  clonazePAM (KLONOPIN) 1 MG tablet, Take 1 mg by mouth at bedtime.  .  Coenzyme Q10 200 MG capsule, Take 200 mg by mouth daily. .  cyclobenzaprine (FLEXERIL) 10 MG tablet, Take 1 tablet (10 mg total) by mouth 3 (three) times daily as needed for muscle spasms. .  cycloSPORINE (RESTASIS) 0.05 % ophthalmic emulsion, Place 1 drop into both eyes 2 (two) times daily. .  Flaxseed, Linseed, (FLAXSEED OIL) 1000 MG CAPS, Take 1,000 mg by mouth daily.  Marland Kitchen  MAGNESIUM CITRATE PO*, Take 2 tablets by mouth daily. .  Magnesium Citrate POWD, Take by mouth. Marland Kitchen  MAGNESIUM LACTATE PO, Take 2 tablets by mouth at bedtime. .  moxifloxacin (VIGAMOX) 0.5 % ophthalmic solution, Place 1 drop into the right eye 4 (four) times daily. .  niacin 500 MG tablet, Take 500 mg by mouth daily.  .  Nutritional Supplements (SYTRINOL PO), Take 1 tablet by mouth daily.  .  ondansetron (ZOFRAN ODT) 4 MG disintegrating tablet, Take 1 tablet (4 mg total) by mouth every 8 (eight) hours as needed for nausea or vomiting. Marland Kitchen  OVER THE COUNTER MEDICATION, Take 1-2 tablets by mouth 2 (two) times daily. *Nutrient 950 with NAC2* takes 2 tablets in the morning and 1 tablet at lunchtime .  OVER THE COUNTER MEDICATION, Take 2 tablets by  mouth daily with breakfast. *LV GB Complex* .  OVER THE COUNTER MEDICATION, Take 1 tablet by mouth 2 (two) times daily. *Regenemax or Biosil* .  OVER THE COUNTER MEDICATION, Take 30 mg by mouth daily. *Borotab* .  OVER THE COUNTER MEDICATION, Take 1 capsule by mouth daily. *Lypozyme* .  Phytonadione (VITAMIN K1) POWD, Take by mouth. .  potassium chloride (MICRO-K) 10 MEQ CR capsule, Take 2 tabs by mouth twice daily. .  prednisoLONE acetate (PRED FORTE) 1 % ophthalmic suspension, PLACE 1 DROP INTO OPERATIVE EYE 4 TIMES A DAY (LEFT EYE) .  STRONTIUM GLUCONATE-B6-B12-FA PO, Take 2 tablets by mouth daily.  .  valACYclovir (VALTREX) 1000 MG tablet, TAKE 2 TABLETS EVERY MORNING AND TAKE 2 TABLETS EVERY EVENING FOR 1 DAY AS NEEDED FOR FLARE .  Vitamin D, Ergocalciferol, (DRISDOL) 50000 units CAPS capsule, Take 50,000 Units by mouth every Sunday. Marland Kitchen  VITAMIN K, PHYTONADIONE, PO, Take 2 tablets by mouth daily.  Marland Kitchen  zinc gluconate 50 MG tablet, Take 50-100 mg by mouth 2 (two) times daily. Takes 100mg  in the morning and 50mg  later in the day  * These medications belong to multiple therapeutic classes and are listed under each applicable group.   Reviewed prior external information including notes and imaging from  primary care provider As well as notes that were available from care everywhere and other healthcare systems.  Past medical history, social, surgical and family history all reviewed in electronic medical record.  [name] pertanent information unless stated regarding to the chief complaint.   Review of Systems:  [name] headache, visual changes, nausea, vomiting, diarrhea, constipation, dizziness, abdominal pain, skin rash, fevers, chills, night sweats, weight loss, swollen lymph nodes, body aches, joint swelling, chest pain, shortness of breath, mood changes. POSITIVE muscle aches  Objective  Blood pressure 112/74, pulse 83, height 5\' 3"  (1.6 m), weight 108 lb (49 kg), SpO2 98 %.   General: [name]  apparent distress  alert and oriented x3 mood and affect normal, dressed appropriately.   Procedure: Real-time Ultrasound Guided Injection of right gluteal tendon sheath Device: GE Logiq Q7 Ultrasound guided injection is preferred based studies that show increased duration, increased effect, greater accuracy, decreased procedural pain, increased response rate, and decreased cost with ultrasound guided versus blind injection.  Verbal informed consent obtained.  Time-out conducted.  Noted [name] overlying erythema, induration, or other signs of local infection.  Skin prepped in a sterile fashion.  Local anesthesia: Topical Ethyl chloride.  With sterile technique and under real time ultrasound guidance: With a 21-gauge 2 inch needle injected with 0.5 cc of 0.5% Marcaine at the gluteal muscular tendon juncture.  Patient then given an injection of 5 cc of precentrifuge PRP into the area.  Tolerated the procedure well. Completed without difficulty  Pain immediately improved suggesting accurate placement of the medication.  Advised to call if fevers/chills, erythema, induration, drainage, or persistent bleeding.  Images permanently stored and available for review in the ultrasound unit.  Impression: Technically successful ultrasound guided injection.    Impression and Recommendations:     This case required medical decision making of moderate complexity. The above documentation has been reviewed and is accurate and complete Lyndal Pulley, DO       Note: This dictation was prepared with Dragon dictation along with smaller phrase technology. Any transcriptional errors that result from this process are unintentional.

## 2019-08-25 DIAGNOSIS — L821 Other seborrheic keratosis: Secondary | ICD-10-CM | POA: Diagnosis not present

## 2019-08-25 DIAGNOSIS — D692 Other nonthrombocytopenic purpura: Secondary | ICD-10-CM | POA: Diagnosis not present

## 2019-08-25 DIAGNOSIS — L570 Actinic keratosis: Secondary | ICD-10-CM | POA: Diagnosis not present

## 2019-08-25 DIAGNOSIS — Z85828 Personal history of other malignant neoplasm of skin: Secondary | ICD-10-CM | POA: Diagnosis not present

## 2019-09-08 DIAGNOSIS — R110 Nausea: Secondary | ICD-10-CM | POA: Diagnosis not present

## 2019-09-08 DIAGNOSIS — W228XXA Striking against or struck by other objects, initial encounter: Secondary | ICD-10-CM | POA: Diagnosis not present

## 2019-09-08 DIAGNOSIS — S81811A Laceration without foreign body, right lower leg, initial encounter: Secondary | ICD-10-CM | POA: Diagnosis not present

## 2019-09-08 DIAGNOSIS — L03115 Cellulitis of right lower limb: Secondary | ICD-10-CM | POA: Diagnosis not present

## 2019-09-13 DIAGNOSIS — S81811D Laceration without foreign body, right lower leg, subsequent encounter: Secondary | ICD-10-CM | POA: Diagnosis not present

## 2019-09-13 DIAGNOSIS — X58XXXA Exposure to other specified factors, initial encounter: Secondary | ICD-10-CM | POA: Diagnosis not present

## 2019-09-17 ENCOUNTER — Other Ambulatory Visit: Payer: Self-pay

## 2019-09-17 ENCOUNTER — Ambulatory Visit: Payer: PPO | Admitting: Family Medicine

## 2019-09-17 ENCOUNTER — Encounter: Payer: Self-pay | Admitting: Family Medicine

## 2019-09-17 DIAGNOSIS — M1611 Unilateral primary osteoarthritis, right hip: Secondary | ICD-10-CM

## 2019-09-17 DIAGNOSIS — S73191A Other sprain of right hip, initial encounter: Secondary | ICD-10-CM

## 2019-09-17 NOTE — Progress Notes (Signed)
[facility] [place] Phone: [phone] Subjective:   Christina [name], am serving as a scribe for Dr. Hulan Saas. This visit occurred during the SARS-CoV-2 public health emergency.  Safety protocols were in place, including screening questions prior to the visit, additional usage of staff PPE, and extensive cleaning of exam room while observing appropriate contact time as indicated for disinfecting solutions.   I'm seeing this patient by the request  of:  Marrian Salvage, FNP  CC: Right hip pain follow-up  [id number]   08/13/2019 Patient given injection of PRP here.  Did do relatively well in the contralateral side.  Was concern for more of the labral pathology and was planning on doing an injection of the hip with patient decided that she felt this was more where her pain was localized.  We will consider the possibility of the injection for the labrum as well later on.  Update 09/17/2019 Christina [name] is a 69 y.o. female coming in with complaint of right hip pain. Patient had PRP injection last visit. Patient states that she has good and bad days. Pain is slightly better than before.  Patient states that the PRP in the gluteal tendon has been somewhat helpful.  Continuing not to have more groin pain.  Patient states that there is instability of the hip.  Patient states that if she tries to do too much activity it seems to start aggravating her left side which she has had trouble with previously as well.  Patient states that even going up and down stairs can sometimes feel like a chore.   Patient describes nighttime pain as well.  Patient has tried many different interventions over the course of time including first patient's nerve root injections for the lumbar spine, intra-articular injections for the hips, as well as the gluteal tendons.  Patient feels that the only problem or the worst problem at the moment is the  groin pain.        Past Medical History:  Diagnosis Date  . Abrasion of right leg 12/17/2014  . Adrenal insufficiency ([place])   . Breast cancer of upper-outer quadrant of left female breast ([place]) 12/07/2014  . Cataract, immature    bilateral  . Dental bridge present    upper - x 2  . Dental crowns present   . Hypothyroidism   . Personal history of radiation therapy   . Prediabetes    Past Surgical History:  Procedure Laterality Date  . ABDOMINOPLASTY    . BALLOON SINUPLASTY    . BREAST LUMPECTOMY Left 2016  . BREAST LUMPECTOMY WITH RADIOACTIVE SEED AND SENTINEL LYMPH NODE BIOPSY Left 12/22/2014   Procedure: BREAST LUMPECTOMY WITH RADIOACTIVE SEED AND SENTINEL LYMPH NODE BIOPSY;  Surgeon: Stark Klein, MD;  Location: [facility];  Service: General;  Laterality: Left;  . BUNIONECTOMY Right   . HAMMER TOE SURGERY Bilateral   . RHINOPLASTY    . SEPTOPLASTY     Social History   Socioeconomic History  . Marital status: Widowed    Spouse name: Not on file  . Number of children: Not on file  . Years of education: Not on file  . Highest education level: Not on file  Occupational History    Employer: [employer]  Tobacco Use  . Smoking status: Former Smoker    Packs/day: 0.00    Years: 0.00    Pack years: 0.00    Quit date: 05/01/1980  Years since quitting: 39.4  . Smokeless tobacco: Never Used  Substance and Sexual Activity  . Alcohol use: Yes    Alcohol/week: 0.0 standard drinks    Comment: occasionally  . Drug use: [name]  . Sexual activity: Not on file  Other Topics Concern  . Not on file  Social History Narrative  . Not on file   Social Determinants of Health   Financial Resource Strain:   . Difficulty of Paying Living Expenses:   Food Insecurity:   . Worried About Charity fundraiser in the Last Year:   . Arboriculturist in the Last Year:   Transportation Needs:   . Film/video editor (Medical):   Marland Kitchen Lack of Transportation (Non-Medical):     Physical Activity:   . Days of Exercise per Week:   . Minutes of Exercise per Session:   Stress:   . Feeling of Stress :   Social Connections:   . Frequency of Communication with Friends and Family:   . Frequency of Social Gatherings with Friends and Family:   . Attends Religious Services:   . Active Member of Clubs or Organizations:   . Attends Archivist Meetings:   Marland Kitchen Marital Status:    Allergies  Allergen Reactions  . Celery Oil Other (See Comments)    FEVER BLISTERS  . Doxycycline Nausea And Vomiting  . Rye Grass Flower Pollen Extract [Gramineae Pollens] Swelling    RYE:  SWELLING THROAT - DENIES SOB  . Sulfa Antibiotics Swelling    NOSE  . Tea Swelling    THROAT - DENIES SOB  . Adhesive [Tape] Other (See Comments)    TEARS SKIN   Family History  Problem Relation Age of Onset  . Diabetes Mother   . Diabetes Father   . Cancer Maternal Uncle        pancreatic cancer   . Diabetes Paternal Grandmother   . Diabetes Paternal Grandfather     Current Outpatient Medications (Endocrine & Metabolic):  .  liothyronine (CYTOMEL) 25 MCG tablet, Take 50 mcg by mouth daily. .  metFORMIN (GLUCOPHAGE-XR) 500 MG 24 hr tablet, Take 1,000 mg by mouth 2 (two) times daily. Takes 1000mg  in the morning, 500mg  at lunch, and 500mg  with supper .  methylPREDNISolone (MEDROL) 4 MG tablet, Take 2.5-4 mg by mouth 3 (three) times daily. Takes 4mg  in the morning and 2.5mg  midday and at bedtime  Current Facility-Administered Medications (Endocrine & Metabolic):  .  methylPREDNISolone acetate (DEPO-MEDROL) injection 80 mg    Current Outpatient Medications (Respiratory):  .  albuterol (PROVENTIL HFA;VENTOLIN HFA) 108 (90 BASE) MCG/ACT inhaler, Inhale into the lungs every 6 (six) hours as needed for wheezing or shortness of breath. .  cetirizine (ZYRTEC) 10 MG tablet, Take 10 mg by mouth daily.  .  fluticasone (FLONASE) 50 MCG/ACT nasal spray, Place 1 spray into both nostrils daily. Marland Kitchen   guaiFENesin (MUCINEX) 600 MG 12 hr tablet, Take 600 mg by mouth daily.  .  montelukast (SINGULAIR) 10 MG tablet, Take 10 mg by mouth at bedtime.   Current Outpatient Medications (Analgesics):  .  ibuprofen (ADVIL,MOTRIN) 200 MG tablet, Take 600 mg by mouth every 4 (four) hours as needed for fever, headache, mild pain, moderate pain or cramping. Marland Kitchen  oxyCODONE-acetaminophen (ROXICET) 5-325 MG per tablet, Take 1-2 tablets by mouth every 4 (four) hours as needed for severe pain. (Patient taking differently: Take 0.25-1 tablets by mouth every 4 (four) hours as needed for severe pain. ) .  traMADol (ULTRAM) 50 MG tablet, Take 1 tablet (50 mg total) by mouth every 6 (six) hours as needed.     Current Outpatient Medications (Other):  Marland Kitchen  Alpha Lipoic Acid 200 MG CAPS, Take 600 mg by mouth 2 (two) times daily.  .  Ascorbic Acid (VITAMIN C) 1000 MG tablet, Take 2,000-3,000 mg by mouth 2 (two) times daily. Takes 2000mg  in the morning and 3000mg  at night .  calcium carbonate (OS-CAL) 600 MG TABS tablet, Take 800 mg by mouth 2 (two) times daily with a meal. .  clonazePAM (KLONOPIN) 1 MG tablet, Take 1 mg by mouth at bedtime.  .  Coenzyme Q10 200 MG capsule, Take 200 mg by mouth daily. .  cyclobenzaprine (FLEXERIL) 10 MG tablet, Take 1 tablet (10 mg total) by mouth 3 (three) times daily as needed for muscle spasms. .  cycloSPORINE (RESTASIS) 0.05 % ophthalmic emulsion, Place 1 drop into both eyes 2 (two) times daily. .  Flaxseed, Linseed, (FLAXSEED OIL) 1000 MG CAPS, Take 1,000 mg by mouth daily.  Marland Kitchen  MAGNESIUM CITRATE PO*, Take 2 tablets by mouth daily. .  Magnesium Citrate POWD, Take by mouth. Marland Kitchen  MAGNESIUM LACTATE PO, Take 2 tablets by mouth at bedtime. .  moxifloxacin (VIGAMOX) 0.5 % ophthalmic solution, Place 1 drop into the right eye 4 (four) times daily. .  niacin 500 MG tablet, Take 500 mg by mouth daily.  .  Nutritional Supplements (SYTRINOL PO), Take 1 tablet by mouth daily.  .  ondansetron  (ZOFRAN ODT) 4 MG disintegrating tablet, Take 1 tablet (4 mg total) by mouth every 8 (eight) hours as needed for nausea or vomiting. Marland Kitchen  OVER THE COUNTER MEDICATION, Take 1-2 tablets by mouth 2 (two) times daily. *Nutrient 950 with NAC2* takes 2 tablets in the morning and 1 tablet at lunchtime .  OVER THE COUNTER MEDICATION, Take 2 tablets by mouth daily with breakfast. *LV GB Complex* .  OVER THE COUNTER MEDICATION, Take 1 tablet by mouth 2 (two) times daily. *Regenemax or Biosil* .  OVER THE COUNTER MEDICATION, Take 30 mg by mouth daily. *Borotab* .  OVER THE COUNTER MEDICATION, Take 1 capsule by mouth daily. *Lypozyme* .  Phytonadione (VITAMIN K1) POWD, Take by mouth. .  potassium chloride (MICRO-K) 10 MEQ CR capsule, Take 2 tabs by mouth twice daily. .  prednisoLONE acetate (PRED FORTE) 1 % ophthalmic suspension, PLACE 1 DROP INTO OPERATIVE EYE 4 TIMES A DAY (LEFT EYE) .  STRONTIUM GLUCONATE-B6-B12-FA PO, Take 2 tablets by mouth daily.  .  valACYclovir (VALTREX) 1000 MG tablet, TAKE 2 TABLETS EVERY MORNING AND TAKE 2 TABLETS EVERY EVENING FOR 1 DAY AS NEEDED FOR FLARE .  Vitamin D, Ergocalciferol, (DRISDOL) 50000 units CAPS capsule, Take 50,000 Units by mouth every Sunday. Marland Kitchen  VITAMIN K, PHYTONADIONE, PO, Take 2 tablets by mouth daily.  Marland Kitchen  zinc gluconate 50 MG tablet, Take 50-100 mg by mouth 2 (two) times daily. Takes 100mg  in the morning and 50mg  later in the day  * These medications belong to multiple therapeutic classes and are listed under each applicable group.   Reviewed prior external information including notes and imaging from  primary care provider As well as notes that were available from care everywhere and other healthcare systems.  Past medical history, social, surgical and family history all reviewed in electronic medical record.  [name] pertanent information unless stated regarding to the chief complaint.   Review of Systems:  [name] headache, visual changes, nausea, vomiting,  diarrhea, constipation, dizziness, abdominal pain, skin rash, fevers, chills, night sweats, weight loss, swollen lymph nodes, , joint swelling, chest pain, shortness of breath, mood changes. POSITIVE muscle aches, body aches  Objective  Blood pressure 130/62, height 5\' 3"  (1.6 m), weight 113 lb (51.3 kg).   General: [name] apparent distress alert and oriented x3 mood and affect normal, dressed appropriately.  HEENT: Pupils equal, extraocular movements intact  Respiratory: Patient's speak in full sentences and does not appear short of breath  Cardiovascular: [name] lower extremity edema, non tender, [name] erythema  Gait severely antalgic. Patient's right hip has decreased range of motion especially with internal motion of only 5 degrees.  Has only 2 degrees on the contralateral side.  Patient does have tightness with Corky Sox bilaterally.  Weakness of the hip abductors bilaterally right greater than left.  Neurovascularly intact distally.  [name] radicular symptoms though with straight leg test.  Does have some mild diffuse tenderness of the lumbar spine noted.   Impression and Recommendations:     This case required medical decision making of moderate complexity. The above documentation has been reviewed and is accurate and complete Lyndal Pulley, DO       Note: This dictation was prepared with Dragon dictation along with smaller phrase technology. Any transcriptional errors that result from this process are unintentional.

## 2019-09-17 NOTE — Assessment & Plan Note (Signed)
Patient has arthritis of the right hip.  Seems to be giving her pain and increasing instability.  Has failed really all conservative therapy.  Patient has contralateral hip arthritis as well as gluteal tendon tears initially bilaterally.  Has made progress with physical therapy with the strengthening of the glutes and the core strengthening but is still continuing to have chronic pain.  At this point I do believe that surgical intervention may be necessary.  I believe that patient's quality of life and patient is having more pain with even activities of daily living.  Do not want to increase to any other medications other than the muscle relaxer which we did discuss about, intermittent ibuprofen, do not feel that more injections or PRP would be helpful in this individual.  Discussed with patient's imaging and was with her for greater than 32 minutes today.  Follow-up with me more on an as-needed basis and will be referred accordingly.

## 2019-09-17 NOTE — Patient Instructions (Signed)
Refer to [facility] Let's be in contact via [facility]

## 2019-09-23 ENCOUNTER — Ambulatory Visit (INDEPENDENT_AMBULATORY_CARE_PROVIDER_SITE_OTHER): Payer: PPO | Admitting: Family

## 2019-09-23 ENCOUNTER — Other Ambulatory Visit: Payer: Self-pay

## 2019-09-23 ENCOUNTER — Encounter: Payer: Self-pay | Admitting: Family

## 2019-09-23 VITALS — BP 128/68 | HR 80 | Temp 98.2°F | Ht 63.0 in

## 2019-09-23 DIAGNOSIS — L03115 Cellulitis of right lower limb: Secondary | ICD-10-CM | POA: Diagnosis not present

## 2019-09-23 MED ORDER — MUPIROCIN 2 % EX OINT: 1.0000 "application " | TOPICAL_OINTMENT | Freq: Two times a day (BID) | CUTANEOUS | 0 refills | Status: DC

## 2019-09-23 MED ORDER — CLINDAMYCIN HCL 300 MG PO CAPS: 300.0000 mg | ORAL_CAPSULE | Freq: Three times a day (TID) | ORAL | 0 refills | Status: DC

## 2019-09-23 NOTE — Progress Notes (Signed)
Christina [name] is a 69 y.o. female with the following history as recorded in [facility]:  Patient Active Problem List   Diagnosis Date Noted  . Arthritis of right hip 09/17/2019  . Labral tear of right hip joint 08/06/2019  . Gluteal tendonitis of right buttock 05/22/2019  . Tear of left gluteus medius tendon 01/04/2019  . Arthritis of left hip 03/20/2018  . Greater trochanteric bursitis of right hip 06/13/2017  . 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 Breast cancer of upper-outer quadrant of left female breast ([place]) 12/07/2014  . Acute recurrent sinusitis 01/17/2013    Current Outpatient Medications  Medication Sig Dispense Refill  . albuterol (PROVENTIL HFA;VENTOLIN HFA) 108 (90 BASE) MCG/ACT inhaler Inhale into the lungs every 6 (six) hours as needed for wheezing or shortness of breath.    Ilean Skill Lipoic Acid 200 MG CAPS Take 600 mg by mouth 2 (two) times daily.     . Ascorbic Acid (VITAMIN C) 1000 MG tablet Take 2,000-3,000 mg by mouth 2 (two) times daily. Takes 2000mg  in the morning and 3000mg  at night    . calcium carbonate (OS-CAL) 600 MG TABS tablet Take 800 mg by mouth 2 (two) times daily with a meal.    . cetirizine (ZYRTEC) 10 MG tablet Take 10 mg by mouth daily.     . clindamycin (CLEOCIN) 300 MG capsule Take 1 capsule (300 mg total) by mouth 3 (three) times daily. 21 capsule 0  . clonazePAM (KLONOPIN) 1 MG tablet Take 1 mg by mouth at bedtime.     . Coenzyme Q10 200 MG capsule Take 200 mg by mouth daily.    . cyclobenzaprine (FLEXERIL) 10 MG tablet Take 1 tablet (10 mg total) by mouth 3 (three) times daily as needed for muscle spasms. 90 tablet 0  . cycloSPORINE (RESTASIS) 0.05 % ophthalmic emulsion Place 1 drop into both eyes 2 (two) times daily.    . Flaxseed, Linseed, (FLAXSEED OIL) 1000 MG CAPS Take 1,000 mg by mouth daily.     . fluticasone (FLONASE) 50 MCG/ACT nasal spray Place 1 spray into both nostrils daily.  11  . guaiFENesin (MUCINEX) 600 MG 12 hr  tablet Take 600 mg by mouth daily.     Marland Kitchen ibuprofen (ADVIL,MOTRIN) 200 MG tablet Take 600 mg by mouth every 4 (four) hours as needed for fever, headache, mild pain, moderate pain or cramping.    Marland Kitchen liothyronine (CYTOMEL) 25 MCG tablet Take 50 mcg by mouth daily.    Marland Kitchen MAGNESIUM CITRATE PO Take 2 tablets by mouth daily.    . Magnesium Citrate POWD Take by mouth.    Marland Kitchen MAGNESIUM LACTATE PO Take 2 tablets by mouth at bedtime.    . metFORMIN (GLUCOPHAGE-XR) 500 MG 24 hr tablet Take 1,000 mg by mouth 2 (two) times daily. Takes 1000mg  in the morning, 500mg  at lunch, and 500mg  with supper  1  . methylPREDNISolone (MEDROL) 4 MG tablet Take 2.5-4 mg by mouth 3 (three) times daily. Takes 4mg  in the morning and 2.5mg  midday and at bedtime    . montelukast (SINGULAIR) 10 MG tablet Take 10 mg by mouth at bedtime.    . moxifloxacin (VIGAMOX) 0.5 % ophthalmic solution Place 1 drop into the right eye 4 (four) times daily.    . niacin 500 MG tablet Take 500 mg by mouth daily.     . Nutritional Supplements (SYTRINOL PO) Take 1 tablet by mouth daily.     . ondansetron (  ZOFRAN ODT) 4 MG disintegrating tablet Take 1 tablet (4 mg total) by mouth every 8 (eight) hours as needed for nausea or vomiting. 12 tablet 0  . OVER THE COUNTER MEDICATION Take 1-2 tablets by mouth 2 (two) times daily. *Nutrient 950 with NAC2* takes 2 tablets in the morning and 1 tablet at lunchtime    . OVER THE COUNTER MEDICATION Take 2 tablets by mouth daily with breakfast. *LV GB Complex*    . OVER THE COUNTER MEDICATION Take 1 tablet by mouth 2 (two) times daily. *Regenemax or Biosil*    . OVER THE COUNTER MEDICATION Take 30 mg by mouth daily. *Borotab*    . OVER THE COUNTER MEDICATION Take 1 capsule by mouth daily. *Lypozyme*    . oxyCODONE-acetaminophen (ROXICET) 5-325 MG per tablet Take 1-2 tablets by mouth every 4 (four) hours as needed for severe pain. (Patient taking differently: Take 0.25-1 tablets by mouth every 4 (four) hours as needed for  severe pain. ) 30 tablet 0  . Phytonadione (VITAMIN K1) POWD Take by mouth.    . potassium chloride (MICRO-K) 10 MEQ CR capsule Take 2 tabs by mouth twice daily.    . prednisoLONE acetate (PRED FORTE) 1 % ophthalmic suspension PLACE 1 DROP INTO OPERATIVE EYE 4 TIMES A DAY (LEFT EYE)    . STRONTIUM GLUCONATE-B6-B12-FA PO Take 2 tablets by mouth daily.     . traMADol (ULTRAM) 50 MG tablet Take 1 tablet (50 mg total) by mouth every 6 (six) hours as needed. 20 tablet 0  . valACYclovir (VALTREX) 1000 MG tablet TAKE 2 TABLETS EVERY MORNING AND TAKE 2 TABLETS EVERY EVENING FOR 1 DAY AS NEEDED FOR FLARE    . Vitamin D, Ergocalciferol, (DRISDOL) 50000 units CAPS capsule Take 50,000 Units by mouth every Sunday.    Marland Kitchen VITAMIN K, PHYTONADIONE, PO Take 2 tablets by mouth daily.     Marland Kitchen zinc gluconate 50 MG tablet Take 50-100 mg by mouth 2 (two) times daily. Takes 100mg  in the morning and 50mg  later in the day    . mupirocin ointment (BACTROBAN) 2 % Apply 1 application topically 2 (two) times daily. 22 g 0   Current Facility-Administered Medications  Medication Dose Route Frequency Provider Last Rate Last Admin  . methylPREDNISolone acetate (DEPO-MEDROL) injection 80 mg  80 mg Other Once Magnus Sinning, MD        Allergies: Celery oil, Doxycycline, Rye grass flower pollen extract [gramineae pollens], Sulfa antibiotics, Tea, and Adhesive [tape]  Past Medical History:  Diagnosis Date  . Abrasion of right leg 12/17/2014  . Adrenal insufficiency ([place])   . Breast cancer of upper-outer quadrant of left female breast ([place]) 12/07/2014  . Cataract, immature    bilateral  . Dental bridge present    upper - x 2  . Dental crowns present   . Hypothyroidism   . Personal history of radiation therapy   . Prediabetes     Past Surgical History:  Procedure Laterality Date  . ABDOMINOPLASTY    . BALLOON SINUPLASTY    . BREAST LUMPECTOMY Left 2016  . BREAST LUMPECTOMY WITH RADIOACTIVE SEED AND SENTINEL LYMPH NODE  BIOPSY Left 12/22/2014   Procedure: BREAST LUMPECTOMY WITH RADIOACTIVE SEED AND SENTINEL LYMPH NODE BIOPSY;  Surgeon: Stark Klein, MD;  Location: [facility];  Service: General;  Laterality: Left;  . BUNIONECTOMY Right   . HAMMER TOE SURGERY Bilateral   . RHINOPLASTY    . SEPTOPLASTY      Family History  Problem Relation Age of Onset  . Diabetes Mother   . Diabetes Father   . Cancer Maternal Uncle        pancreatic cancer   . Diabetes Paternal Grandmother   . Diabetes Paternal Grandfather     Social History   Tobacco Use  . Smoking status: Former Smoker    Packs/day: 0.00    Years: 0.00    Pack years: 0.00    Quit date: 05/01/1980    Years since quitting: 39.4  . Smokeless tobacco: Never Used  Substance Use Topics  . Alcohol use: Yes    Alcohol/week: 0.0 standard drinks    Comment: occasionally    Subjective:   Right shin injury occurred when stepping down from salon chair; was seen at [facility] on [facility]/10 for initial evaluation; she re-injured the skin when changing the bandage and went back to [facility] for re-check on 09/13/2019; was showing initial improvement while on oral Clindamycin ( cannot tolerate Sulfa or Doxycycline); however, since stopping oral antibiotic, the symptoms have returned;   Tdap up to date as of 2019;    Objective:  Vitals:   09/23/19 1035  BP: 128/68  Pulse: 80  Temp: 98.2 F (36.8 C)  TempSrc: Oral  SpO2: 97%  Height: 5\' 3"  (1.6 m)    General: Well developed, well nourished, in no acute distress  Skin : Warm and dry. 2 localized areas c/w skin tears noted over right lower shin; lateral wound has pustular drainage noted; medial wound appears to be healing;  Head: Normocephalic and atraumatic  Lungs: Respirations unlabored;  Neurologic: Alert and oriented; speech intact; face symmetrical; CNII-XII intact without focal deficit   Assessment:  1. Cellulitis of right lower extremity     Plan:  Wound culture of lateral wound is obtained;  extend Clindamycin x 7 more days; will also use Bactroban topical and encourage to apply bid; continue to use Telfa and non-stick bandages; follow-up worse, no better.   This visit occurred during the SARS-CoV-2 public health emergency.  Safety protocols were in place, including screening questions prior to the visit, additional usage of staff PPE, and extensive cleaning of exam room while observing appropriate contact time as indicated for disinfecting solutions.     No follow-ups on file.  Orders Placed This Encounter  Procedures  . Wound culture    Order Specific Question:   Source    Answer:   right shin    Requested Prescriptions   Signed Prescriptions Disp Refills  . clindamycin (CLEOCIN) 300 MG capsule 21 capsule 0    Sig: Take 1 capsule (300 mg total) by mouth 3 (three) times daily.  . mupirocin ointment (BACTROBAN) 2 % 22 g 0    Sig: Apply 1 application topically 2 (two) times daily.

## 2019-09-24 ENCOUNTER — Encounter: Payer: Self-pay | Admitting: Family

## 2019-09-26 LAB — WOUND CULTURE

## 2019-10-01 ENCOUNTER — Encounter: Payer: Self-pay | Admitting: Family

## 2019-10-01 ENCOUNTER — Ambulatory Visit (INDEPENDENT_AMBULATORY_CARE_PROVIDER_SITE_OTHER): Payer: PPO | Admitting: Family

## 2019-10-01 ENCOUNTER — Other Ambulatory Visit: Payer: Self-pay

## 2019-10-01 VITALS — BP 130/68 | HR 86 | Temp 98.1°F | Ht 63.0 in

## 2019-10-01 DIAGNOSIS — S81811D Laceration without foreign body, right lower leg, subsequent encounter: Secondary | ICD-10-CM | POA: Diagnosis not present

## 2019-10-01 NOTE — Progress Notes (Signed)
Christina [name] is a 69 y.o. female with the following history as recorded in Epic[facility]:  Patient Active Problem List   Diagnosis Date Noted  . Arthritis of right hip 09/17/2019  . Labral tear of right hip joint 08/06/2019  . Gluteal tendonitis of right buttock 05/22/2019  . Tear of left gluteus medius tendon 01/04/2019  . Arthritis of left hip 03/20/2018  . Greater trochanteric bursitis of right hip 06/13/2017  . 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 Breast cancer of upper-outer quadrant of left female breast ([place]) 12/07/2014  . Acute recurrent sinusitis 01/17/2013    Current Outpatient Medications  Medication Sig Dispense Refill  . albuterol (PROVENTIL HFA;VENTOLIN HFA) 108 (90 BASE) MCG/ACT inhaler Inhale into the lungs every 6 (six) hours as needed for wheezing or shortness of breath.    Ilean Skill Lipoic Acid 200 MG CAPS Take 600 mg by mouth 2 (two) times daily.     . Ascorbic Acid (VITAMIN C) 1000 MG tablet Take 2,000-3,000 mg by mouth 2 (two) times daily. Takes 2000mg  in the morning and 3000mg  at night    . calcium carbonate (OS-CAL) 600 MG TABS tablet Take 800 mg by mouth 2 (two) times daily with a meal.    . cetirizine (ZYRTEC) 10 MG tablet Take 10 mg by mouth daily.     . clonazePAM (KLONOPIN) 1 MG tablet Take 1 mg by mouth at bedtime.     . Coenzyme Q10 200 MG capsule Take 200 mg by mouth daily.    . cyclobenzaprine (FLEXERIL) 10 MG tablet Take 1 tablet (10 mg total) by mouth 3 (three) times daily as needed for muscle spasms. 90 tablet 0  . cycloSPORINE (RESTASIS) 0.05 % ophthalmic emulsion Place 1 drop into both eyes 2 (two) times daily.    . Flaxseed, Linseed, (FLAXSEED OIL) 1000 MG CAPS Take 1,000 mg by mouth daily.     . fluticasone (FLONASE) 50 MCG/ACT nasal spray Place 1 spray into both nostrils daily.  11  . guaiFENesin (MUCINEX) 600 MG 12 hr tablet Take 600 mg by mouth daily.     Marland Kitchen ibuprofen (ADVIL,MOTRIN) 200 MG tablet Take 600 mg by mouth every 4  (four) hours as needed for fever, headache, mild pain, moderate pain or cramping.    Marland Kitchen liothyronine (CYTOMEL) 25 MCG tablet Take 50 mcg by mouth daily.    Marland Kitchen MAGNESIUM CITRATE PO Take 2 tablets by mouth daily.    . Magnesium Citrate POWD Take by mouth.    Marland Kitchen MAGNESIUM LACTATE PO Take 2 tablets by mouth at bedtime.    . metFORMIN (GLUCOPHAGE-XR) 500 MG 24 hr tablet Take 1,000 mg by mouth 2 (two) times daily. Takes 1000mg  in the morning, 500mg  at lunch, and 500mg  with supper  1  . methylPREDNISolone (MEDROL) 4 MG tablet Take 2.5-4 mg by mouth 3 (three) times daily. Takes 4mg  in the morning and 2.5mg  midday and at bedtime    . montelukast (SINGULAIR) 10 MG tablet Take 10 mg by mouth at bedtime.    . moxifloxacin (VIGAMOX) 0.5 % ophthalmic solution Place 1 drop into the right eye 4 (four) times daily.    . mupirocin ointment (BACTROBAN) 2 % Apply 1 application topically 2 (two) times daily. 22 g 0  . niacin 500 MG tablet Take 500 mg by mouth daily.     . Nutritional Supplements (SYTRINOL PO) Take 1 tablet by mouth daily.     . ondansetron (ZOFRAN ODT) 4 MG  disintegrating tablet Take 1 tablet (4 mg total) by mouth every 8 (eight) hours as needed for nausea or vomiting. 12 tablet 0  . OVER THE COUNTER MEDICATION Take 1-2 tablets by mouth 2 (two) times daily. *Nutrient 950 with NAC2* takes 2 tablets in the morning and 1 tablet at lunchtime    . OVER THE COUNTER MEDICATION Take 2 tablets by mouth daily with breakfast. *LV GB Complex*    . OVER THE COUNTER MEDICATION Take 1 tablet by mouth 2 (two) times daily. *Regenemax or Biosil*    . OVER THE COUNTER MEDICATION Take 30 mg by mouth daily. *Borotab*    . OVER THE COUNTER MEDICATION Take 1 capsule by mouth daily. *Lypozyme*    . oxyCODONE-acetaminophen (ROXICET) 5-325 MG per tablet Take 1-2 tablets by mouth every 4 (four) hours as needed for severe pain. (Patient taking differently: Take 0.25-1 tablets by mouth every 4 (four) hours as needed for severe pain.  ) 30 tablet 0  . Phytonadione (VITAMIN K1) POWD Take by mouth.    . potassium chloride (MICRO-K) 10 MEQ CR capsule Take 2 tabs by mouth twice daily.    . prednisoLONE acetate (PRED FORTE) 1 % ophthalmic suspension PLACE 1 DROP INTO OPERATIVE EYE 4 TIMES A DAY (LEFT EYE)    . STRONTIUM GLUCONATE-B6-B12-FA PO Take 2 tablets by mouth daily.     . traMADol (ULTRAM) 50 MG tablet Take 1 tablet (50 mg total) by mouth every 6 (six) hours as needed. 20 tablet 0  . valACYclovir (VALTREX) 1000 MG tablet TAKE 2 TABLETS EVERY MORNING AND TAKE 2 TABLETS EVERY EVENING FOR 1 DAY AS NEEDED FOR FLARE    . Vitamin D, Ergocalciferol, (DRISDOL) 50000 units CAPS capsule Take 50,000 Units by mouth every Sunday.    Marland Kitchen VITAMIN K, PHYTONADIONE, PO Take 2 tablets by mouth daily.     Marland Kitchen zinc gluconate 50 MG tablet Take 50-100 mg by mouth 2 (two) times daily. Takes 100mg  in the morning and 50mg  later in the day     Current Facility-Administered Medications  Medication Dose Route Frequency Provider Last Rate Last Admin  . methylPREDNISolone acetate (DEPO-MEDROL) injection 80 mg  80 mg Other Once Magnus Sinning, MD        Allergies: Celery oil, Doxycycline, [place] grass flower pollen extract [gramineae pollens], Sulfa antibiotics, Tea, and Adhesive [tape]  Past Medical History:  Diagnosis Date  . Abrasion of right leg 12/17/2014  . Adrenal insufficiency ([place])   . Breast cancer of upper-outer quadrant of left female breast ([place]) 12/07/2014  . Cataract, immature    bilateral  . Dental bridge present    upper - x 2  . Dental crowns present   . Hypothyroidism   . Personal history of radiation therapy   . Prediabetes     Past Surgical History:  Procedure Laterality Date  . ABDOMINOPLASTY    . BALLOON SINUPLASTY    . BREAST LUMPECTOMY Left 2016  . BREAST LUMPECTOMY WITH RADIOACTIVE SEED AND SENTINEL LYMPH NODE BIOPSY Left 12/22/2014   Procedure: BREAST LUMPECTOMY WITH RADIOACTIVE SEED AND SENTINEL LYMPH NODE BIOPSY;   Surgeon: Stark Klein, MD;  Location: [facility];  Service: General;  Laterality: Left;  . BUNIONECTOMY Right   . HAMMER TOE SURGERY Bilateral   . RHINOPLASTY    . SEPTOPLASTY      Family History  Problem Relation Age of Onset  . Diabetes Mother   . Diabetes Father   . Cancer Maternal Uncle  pancreatic cancer   . Diabetes Paternal Grandmother   . Diabetes Paternal Grandfather     Social History   Tobacco Use  . Smoking status: Former Smoker    Packs/day: 0.00    Years: 0.00    Pack years: 0.00    Quit date: 05/01/1980    Years since quitting: 39.4  . Smokeless tobacco: Never Used  Substance Use Topics  . Alcohol use: Yes    Alcohol/week: 0.0 standard drinks    Comment: occasionally    Subjective:  Re-check of skin tear of left lower leg; was seen last week and antibiotic started/ reassurance given; notes that one of the areas opened up again last week while she was sleeping and she started bleeding; has not had further problems since that time but wanted reassurance that the wound was healing;     Objective:  Vitals:   10/01/19 0938  BP: 130/68  Pulse: 86  Temp: 98.1 F (36.7 C)  TempSrc: Oral  SpO2: 96%  Height: 5\' 3"  (1.6 m)    General: Well developed, well nourished, in no acute distress  Skin : Warm and dry. Area of previous skin tear on right lower leg has scabbed nicely with no concerning erythema/ pustular drainage;  Head: Normocephalic and atraumatic  Lungs: Respirations unlabored;  Neurologic: Alert and oriented; speech intact; face symmetrical; moves all extremities well; CNII-XII intact without focal deficit   Assessment:  1. Noninfected skin tear of right lower extremity, subsequent encounter     Plan:  Reassurance given; area appears to be healing well; no concerns for infection; follow-up as needed otherwise.  This visit occurred during the SARS-CoV-2 public health emergency.  Safety protocols were in place, including  screening questions prior to the visit, additional usage of staff PPE, and extensive cleaning of exam room while observing appropriate contact time as indicated for disinfecting solutions.     No follow-ups on file.  No orders of the defined types were placed in this encounter.   Requested Prescriptions    No prescriptions requested or ordered in this encounter

## 2019-10-06 ENCOUNTER — Other Ambulatory Visit: Payer: Self-pay

## 2019-10-06 ENCOUNTER — Ambulatory Visit: Payer: PPO | Admitting: Family Medicine

## 2019-10-06 ENCOUNTER — Ambulatory Visit: Payer: Self-pay

## 2019-10-06 DIAGNOSIS — M25551 Pain in right hip: Secondary | ICD-10-CM

## 2019-10-06 DIAGNOSIS — S76111A Strain of right quadriceps muscle, fascia and tendon, initial encounter: Secondary | ICD-10-CM | POA: Diagnosis not present

## 2019-10-06 NOTE — Patient Instructions (Addendum)
Thank you for coming in today.  I think you strained your quad muscle.   Do not use heat for a few weeks.  Ice is OK   I recommend you obtained a compression sleeve to help with your joint problems. There are many options on the market however I recommend obtaining a Thigh Body Helix compression sleeve.  You can find information (including how to appropriate measure yourself for sizing) can be found at www.Body http://www.lambert.com/.  Many of these products are health savings account (HSA) eligible.   You can use the compression sleeve at any time throughout the day but is most important to use while being active as well as for 2 hours post-activity.   It is appropriate to ice following activity with the compression sleeve in place.   This will resolve but if it does not go away completely in 3-6 weeks we can drain it.   This may turn into a seroma (a collection of fluid)   Quadriceps Strain  A quadriceps strain is an injury to the muscles or tendons on the front of the thigh. The quadriceps muscles are used in straightening the knee and bending the hip. A strain occurs when the muscle is overstretched or overloaded. There are three types of strains:  Grade 1 is a mild strain. It involves a stretching or minor tearing of your muscle fibers or tendons. You should have little, if any, trouble using your thigh.  Grade 2 is a moderate strain. It involves a partial tearing of your muscle fibers or tendons. You will have pain and some loss of strength in your thigh.  Grade 3 is a severe strain. It involves a complete tearing of your muscle fibers or tendons. It causes severe pain and loss of strength in your thigh. Recovery will take a few weeks or longer, depending on how bad your strain is. What are the causes? This injury is caused by overextending the muscles in the thigh. What increases the risk? The following factors may make you more likely to develop this injury:  Participating in: ?  Activities that involve jumping, sprinting, or sudden twisting. ? Contact sports, such as football or soccer.  Having a previous injury to your thigh or knee.  Having poor strength and flexibility.  Not warming up properly before activity.  Having one leg that is much stronger than the other.  Exercising to the point of exhaustion. What are the signs or symptoms? Symptoms of this condition include:  Sudden, severe pain in your thigh.  Pain and tenderness over your quadriceps muscles. The pain gets worse when you use these muscles.  Muscle spasm in your thigh.  Swelling in your thigh.  Bruising.  Having trouble with tasks that involve using your quadriceps muscle, such as walking.  A crackling sound when the tendon is moved or touched. How is this diagnosed? This condition is diagnosed based on:  A physical exam.  Your medical history.  Imaging tests, such as: ? X-rays. ? Ultrasound. ? MRI. How is this treated? Treatment for this condition may include:  Resting your leg and avoiding activities that cause pain.  Taking medicine to help reduce pain and inflammation.  Applying ice to the area to relieve swelling and inflammation.  Elevating the leg to reduce or prevent swelling.  Applying a compression wrap to the muscle.  Using crutches until you can walk without pain.  Working with a physical therapist on exercises to restore strength and flexibility in your thigh. In  rare cases, surgery may be needed. Follow these instructions at home: Managing pain, stiffness, and swelling   If directed, put ice on the injured area. ? Put ice in a plastic bag. ? Place a towel between your skin and the bag. ? Leave the ice on for 20 minutes, 2-3 times a day.  Raise (elevate) the injured area above the level of your heart while you are sitting or lying down. Activity  Do not use the injured leg to support your body weight until your health care provider says that you  can. Use crutches as told by your health care provider.  Do exercises as told by your health care provider.  Return to your normal activities as told by your health care provider. Ask your health care provider what activities are safe for you. General instructions  Take over-the-counter and prescription medicines only as told by your health care provider.  Use compression wraps to apply pressure as told by your health care provider.  Keep all follow-up visits as told by your health care provider. This is important. How is this prevented?  Warm up and stretch before being active.  Cool down and stretch after being active.  Give your body time to rest between periods of activity.  Maintain physical fitness, including: ? Strength. ? Flexibility.  Be safe and responsible while being active. This will help you to avoid falls.  Do at least 150 minutes of moderate-intensity exercise each week, such as brisk walking or water aerobics. Contact a health care provider if:  Your pain, bruising, or tenderness gets worse, even with treatment.  Your leg becomes weaker. Summary  A quadriceps strain is an injury to the muscles or tendons on the front of the thigh.  This injury is caused by overextending the muscles in the thigh.  Treatment may include rest, ice, medicines, and physical therapy. In rare cases, surgery may be needed. This information is not intended to replace advice given to you by your health care provider. Make sure you discuss any questions you have with your health care provider. Document Revised: 08/09/2018 Document Reviewed: 03/14/2018 Elsevier Patient Education  [employer].   Seroma A seroma is a collection of fluid on the body that looks like swelling or a mass. Seromas form where tissue has been injured or cut. Seromas vary in size. Some are small and painless. Others may become large and cause pain or discomfort. Many seromas go away on their own as the  fluid is naturally absorbed by the body, and some seromas need to be drained. What are the causes? Seromas form as the result of damage to tissue or the removal of tissue. This tissue damage may occur during surgery or because of an injury or trauma. When tissue is disrupted or removed, empty space is created. The body's natural defense system (immune system) causes fluid to enter the empty space and form a seroma. What are the signs or symptoms? Symptoms of this condition include:  Swelling at the site of a surgical cut (incision) or an injury.  Drainage of clear fluid at the surgery or injury site.  Discomfort or pain. How is this diagnosed? This condition is diagnosed based on your symptoms, your medical history, and a physical exam. During the exam, your health care provider will press on the seroma. You may also have tests, including:  Blood tests.  Imaging tests, such as an ultrasound or CT scan. How is this treated? Some seromas go away (resolve) on  their own. Your health care provider may monitor you to make sure the seroma does not cause any complications. If your seroma does not resolve on its own, treatment may include:  Using a needle to drain the fluid from the seroma (needle aspiration).  Inserting a flexible tube (catheter) to drain the fluid.  Applying a bandage (dressing), such as an elastic bandage or binder.  Antibiotic medicines, if the seroma becomes infected. In rare cases, surgery may be done to remove the seroma and repair the area. Follow these instructions at home:   If you were prescribed an antibiotic medicine, take it as told by your health care provider. Do not stop taking the antibiotic even if you start to feel better.  Return to your normal activities as told by your health care provider. Ask your health care provider what activities are safe for you.  Take over-the-counter and prescription medicines only as told by your health care provider.   Check your seroma every day for signs of infection. Check for: ? Redness or pain. ? Fluid or pus. ? More swelling. ? Warmth.  Keep all follow-up visits as told by your health care provider. This is important. Contact a health care provider if:  You have a fever.  You have redness or pain at the site of the seroma.  You have fluid or pus coming from the seroma.  Your seroma is more swollen or is getting bigger.  Your seroma is warm to the touch. This information is not intended to replace advice given to you by your health care provider. Make sure you discuss any questions you have with your health care provider. Document Revised: 03/30/2017 Document Reviewed: 01/28/2016 Elsevier Patient Education  2020 [employer].

## 2019-10-06 NOTE — Progress Notes (Signed)
   I, Wendy Poet, LAT, ATC, am serving as scribe for Dr. Lynne Leader.  Christina [name] is a 69 y.o. female who presents to [place] at [facility] today for f/u of R hip/glute pain.  She was last seen by Dr. Tamala Julian on 09/17/19 for f/u of R glute PRP that she had on 08/13/19.  She has had multiple interventions including lumbar nerve root injections and hip injections.  Since her last visit w/ Dr. Tamala Julian, pt reports that she developed pain in the right anterior thigh over the weekend while doing stretching of her hip flexors.  She felt a pulling sensation and developed swelling in the right anterior medial thigh.  She notes this is very painful and she is having trouble with normal gait.  She has not resumed stretching since.    Diagnostic testing: R hip XR- 07/02/19; R hip MRI- 08/03/19   Pertinent review of systems: No fevers or chills  Relevant historical information: Right hip arthritis and tendinitis.  History of breast cancer   Exam:   General: Well Developed, well nourished, and in no acute distress.   MSK: Right thigh swollen area anterior medial mid thigh.  Tender to palpation.  No visible bruising. No lower leg swelling. Strength intact to hip flexion abduction adduction. Painful with diminished strength in knee extension 4/5. Knee flexion strength intact 5/5.    Lab and Radiology Results Diagnostic Limited MSK Ultrasound of: Right thigh Enlarged but intact appearing quadricep musculature with hypoechoic fluid tracking along the muscle fascicles.  No visible full-thickness tear present. Impression: Quadricep muscle tear partial with bleeding.     Assessment and Plan: 69 y.o. female with quad strain/tear.  Doubtful for full-thickness tear.  Plan for compression physical therapy and ice.  Recheck back in 2 weeks.  We will proceed with repeat evaluation at that point.   PDMP not reviewed this encounter. Orders Placed This Encounter  Procedures  . [place]  LIMITED JOINT SPACE STRUCTURES LOW RIGHT(NO LINKED CHARGES)    Order Specific Question:   Reason for Exam (SYMPTOM  OR DIAGNOSIS REQUIRED)    Answer:   eval quad mass    Order Specific Question:   Preferred imaging location?    Answer:   [facility]  . Ambulatory referral to Physical Therapy    Referral Priority:   Routine    Referral Type:   Physical Medicine    Referral Reason:   Specialty Services Required    Requested Specialty:   Physical Therapy   No orders of the defined types were placed in this encounter.    Discussed warning signs or symptoms. Please see discharge instructions. Patient expresses understanding.   The above documentation has been reviewed and is accurate and complete Lynne Leader, M.D.

## 2019-10-07 ENCOUNTER — Other Ambulatory Visit: Payer: Self-pay

## 2019-10-07 ENCOUNTER — Encounter: Payer: Self-pay | Admitting: Physical Therapy

## 2019-10-07 ENCOUNTER — Ambulatory Visit: Payer: PPO | Attending: Family Medicine | Admitting: Physical Therapy

## 2019-10-07 DIAGNOSIS — S76111A Strain of right quadriceps muscle, fascia and tendon, initial encounter: Secondary | ICD-10-CM | POA: Diagnosis not present

## 2019-10-07 DIAGNOSIS — M6281 Muscle weakness (generalized): Secondary | ICD-10-CM | POA: Diagnosis not present

## 2019-10-07 DIAGNOSIS — M79604 Pain in right leg: Secondary | ICD-10-CM | POA: Insufficient documentation

## 2019-10-07 DIAGNOSIS — M62838 Other muscle spasm: Secondary | ICD-10-CM | POA: Diagnosis not present

## 2019-10-07 NOTE — Therapy (Signed)
[facility] [address], [place], [place], [address] Phone: [phone]   Fax:  [phone]  Physical Therapy Evaluation  Patient Details  Name: Christina [name] MRN: [id number] Date of Birth: 1951/02/23 Referring Provider (PT): Gregor Hams, MD   Encounter Date: 10/07/2019  PT End of Session - 10/07/19 1112    Visit Number  1    Date for PT Re-Evaluation  12/02/19    PT Start Time  1100    PT Stop Time  1150    PT Time Calculation (min)  50 min    Activity Tolerance  Patient tolerated treatment well    Behavior During Therapy  [facility] for tasks assessed/performed       Past Medical History:  Diagnosis Date  . Abrasion of right leg 12/17/2014  . Adrenal insufficiency ([place])   . Breast cancer of upper-outer quadrant of left female breast ([place]) 12/07/2014  . Cataract, immature    bilateral  . Dental bridge present    upper - x 2  . Dental crowns present   . Hypothyroidism   . Personal history of radiation therapy   . Prediabetes     Past Surgical History:  Procedure Laterality Date  . ABDOMINOPLASTY    . BALLOON SINUPLASTY    . BREAST LUMPECTOMY Left 2016  . BREAST LUMPECTOMY WITH RADIOACTIVE SEED AND SENTINEL LYMPH NODE BIOPSY Left 12/22/2014   Procedure: BREAST LUMPECTOMY WITH RADIOACTIVE SEED AND SENTINEL LYMPH NODE BIOPSY;  Surgeon: Stark Klein, MD;  Location: [place];  Service: General;  Laterality: Left;  . BUNIONECTOMY Right   . HAMMER TOE SURGERY Bilateral   . RHINOPLASTY    . SEPTOPLASTY      There were no vitals filed for this visit.   Subjective Assessment - 10/07/19 1105    Subjective  Pt has large lumb on Rt mid and upper thigh.  It is very sore to touch and aches at night. I cannot lift my leg, it feels like a dead weight.    How long can you walk comfortably?  cannot walk comfortably    Patient Stated Goals  be able to walk and lift my leg without pain    Currently in  Pain?  Yes    Pain Score  5    up to 10 with pressure   Pain Location  Leg    Pain Orientation  Right    Pain Descriptors / Indicators  Aching;Sore    Pain Type  Acute pain    Pain Onset  In the past 7 days    Aggravating Factors   using the Rt    Pain Relieving Factors  ice, rest    Effect of Pain on Daily Activities  walking and standing    Multiple Pain Sites  No         [facility] PT Assessment - 10/07/19 0001      Assessment   Medical Diagnosis  M25.551 (ICD-10-CM) - Pain in joint involving right pelvic region and thigh; S76.111A (ICD-10-CM) - Strain of right quadriceps, initial encounter    Referring Provider (PT)  Gregor Hams, MD    Prior Therapy  No      Precautions   Precautions  None      Restrictions   Weight Bearing Restrictions  No      Balance Screen   Has the patient fallen in the past 6 months  Yes    How many times?  --  slipped mud and tore labrum     [place] residence    Living Arrangements  Alone      Prior Function   Level of Independence  Independent      Cognition   Overall Cognitive Status  Within Functional Limits for tasks assessed      Observation/Other Assessments   Focus on Therapeutic Outcomes (FOTO)   51% limited      Observation/Other Assessments-Edema    Edema  Circumferential      Circumferential Edema   Circumferential - Right  42   around widest part of upper thigh     ROM / Strength   AROM / PROM / Strength  AROM;PROM;Strength      PROM   Overall PROM Comments  hip IR Rt 0 deg; Lt 5 deg      Strength   Overall Strength Comments  hip grossly 4+/5      Flexibility   Soft Tissue Assessment /Muscle Length  yes    Hamstrings  25% limited with pain at end range Lt>Rt      Palpation   Palpation comment  TTP quad at mid  to upper thigh, medial and around anteriorly; TTP Rt adductors      Ambulation/Gait   Gait Pattern  Decreased stance time - right;Decreased stride length;Antalgic                   Objective measurements completed on examination: See above findings.              PT Education - 10/07/19 1147    Education Details  Access Code: [email or web address]    Person(s) Educated  Patient    Methods  Explanation;Demonstration;Handout;Verbal cues    Comprehension  Verbalized understanding;Returned demonstration       PT Short Term Goals - 10/07/19 1407      PT SHORT TERM GOAL #1   Title  Pt will demo consistency and independence with her HEP to decrease pain and improve BLE strength.     Time  4    Period  Weeks    Status  New    Target Date  11/04/19      PT SHORT TERM GOAL #2   Title  Report 25% less leg pain    Time  4    Period  Weeks    Status  New    Target Date  11/04/19      PT SHORT TERM GOAL #3   Title  Pt will demonstrate at least 4/5 knee extension Rt side without pain for improved gait    Time  4    Period  Weeks    Status  New    Target Date  11/04/19        PT Long Term Goals - 10/07/19 1400      PT LONG TERM GOAL #1   Title  Pt will demonstrate A/SLR 10x with pelvic stability and pain free for ability to perform functional activities such as bed mobility and car transfers.    Time  8    Status  New    Target Date  12/02/19      PT LONG TERM GOAL #2   Title  Pt will be able to walk at least 8 minutes without limp or antalgic gait due to improved quad strength    Baseline  antalgice gait on Rt side    Time  8    Period  Weeks    Status  New    Target Date  12/02/19      PT LONG TERM GOAL #3   Title  Pt will report atleast 75% improvement in her symptoms to allow for her to resume walking activities without difficulty.    Time  8    Period  Weeks    Status  New    Target Date  12/02/19      PT LONG TERM GOAL #4   Title  FOTO < or = to 29% limited    Baseline  51%    Time  8    Period  Weeks    Status  New    Target Date  12/02/19      PT LONG TERM GOAL #5   Title  ind with advanced HEP    Time  8     Period  Weeks    Status  New    Target Date  12/02/19             Plan - 10/07/19 1357    Clinical Impression Statement  Pt presents to skilled PT due to quad strain on Rt thigh.  Pt has decreased quad strength and pain.  TTP Rt quad and there is a large knot most likely a hemotoma due to damaged soft tissue.  Pt  has LE weaknesses bilaterally and pain in her hips with IR MMT.  Pt has edema as measured in assessment.  Pt will benefit from skilled PT to facilitate healing and LE strength and flexibility so she can return to maximum function.    Examination-Activity Limitations  Stand;Locomotion Level;Transfers    Stability/Clinical Decision Making  Stable/Uncomplicated    Clinical Decision Making  Low    Rehab Potential  Excellent    PT Frequency  2x / week    PT Duration  8 weeks    PT Treatment/Interventions  ADLs/Self Care Home Management;Aquatic Therapy;Cryotherapy;Electrical Stimulation;Iontophoresis 4mg /ml Dexamethasone;Moist Heat;Traction;Neuromuscular re-education;Therapeutic exercise;Therapeutic activities;Ultrasound;Gait training;Patient/family education;Manual techniques;Dry needling;Passive range of motion;Taping    PT Next Visit Plan  f/u on response to stim; review stretches, gentle quad strain; gentle STM; circumference measurement; try vaso for reduced edema    PT Home Exercise Plan  Access Code: [email or web address]    Consulted and Agree with Plan of Care  Patient       Patient will benefit from skilled therapeutic intervention in order to improve the following deficits and impairments:  Increased edema, Decreased strength, Difficulty walking, Abnormal gait, Increased fascial restricitons, Increased muscle spasms, Pain  Visit Diagnosis: Other muscle spasm  Muscle weakness (generalized)  Pain in right leg  Strain of right quadriceps, initial encounter     Problem List Patient Active Problem List   Diagnosis Date Noted  . Arthritis of right hip 09/17/2019  . Labral  tear of right hip joint 08/06/2019  . Gluteal tendonitis of right buttock 05/22/2019  . Tear of left gluteus medius tendon 01/04/2019  . Arthritis of left hip 03/20/2018  . Greater trochanteric bursitis of right hip 06/13/2017  . 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 Breast cancer of upper-outer quadrant of left female breast ([place]) 12/07/2014  . Acute recurrent sinusitis 01/17/2013    Jule Ser, PT 10/07/2019, 5:26 PM  [facility] [address] [place], [place], [address] Phone: [phone]   Fax:  [phone]  Name: Christina [name] MRN: [id number] Date of Birth: 04-24-1951

## 2019-10-07 NOTE — Patient Instructions (Signed)
Access Code: [email or web address] URL: [email or web address] Date: 10/07/2019 Prepared by: Jari Favre  Exercises Standing Hamstring Stretch with Step - 1 x daily - 7 x weekly - 3 reps - 1 sets - 30 sec hold Supine Hamstring Stretch - 1 x daily - 7 x weekly - 3 reps - 1 sets - 30 sec hold Prone Quad Stretch with Towel Roll and Strap - 1 x daily - 7 x weekly - 10 reps - 3 sets

## 2019-10-13 ENCOUNTER — Other Ambulatory Visit: Payer: Self-pay

## 2019-10-13 ENCOUNTER — Encounter: Payer: Self-pay | Admitting: Physical Therapy

## 2019-10-13 ENCOUNTER — Ambulatory Visit: Payer: PPO | Admitting: Physical Therapy

## 2019-10-13 DIAGNOSIS — M62838 Other muscle spasm: Secondary | ICD-10-CM | POA: Diagnosis not present

## 2019-10-13 DIAGNOSIS — M79604 Pain in right leg: Secondary | ICD-10-CM

## 2019-10-13 DIAGNOSIS — M6281 Muscle weakness (generalized): Secondary | ICD-10-CM

## 2019-10-13 DIAGNOSIS — S76111A Strain of right quadriceps muscle, fascia and tendon, initial encounter: Secondary | ICD-10-CM

## 2019-10-13 NOTE — Therapy (Signed)
[facility] [address], [place], [place], [address] Phone: [phone]   Fax:  [phone]  Physical Therapy Treatment  Patient Details  Name: Christina [name] MRN: [id number] Date of Birth: [birth year] Referring Provider (PT): Gregor Hams, MD   Encounter Date: 10/13/2019   PT End of Session - 10/13/19 1620    Visit Number 2    Date for PT Re-Evaluation 12/02/19    PT Start Time 9509    PT Stop Time 1703    PT Time Calculation (min) 46 min    Activity Tolerance Patient tolerated treatment well    Behavior During Therapy [facility] for tasks assessed/performed           Past Medical History:  Diagnosis Date  . Abrasion of right leg 12/17/2014  . Adrenal insufficiency ([place])   . Breast cancer of upper-outer quadrant of left female breast ([place]) 12/07/2014  . Cataract, immature    bilateral  . Dental bridge present    upper - x 2  . Dental crowns present   . Hypothyroidism   . Personal history of radiation therapy   . Prediabetes     Past Surgical History:  Procedure Laterality Date  . ABDOMINOPLASTY    . BALLOON SINUPLASTY    . BREAST LUMPECTOMY Left 2016  . BREAST LUMPECTOMY WITH RADIOACTIVE SEED AND SENTINEL LYMPH NODE BIOPSY Left 12/22/2014   Procedure: BREAST LUMPECTOMY WITH RADIOACTIVE SEED AND SENTINEL LYMPH NODE BIOPSY;  Surgeon: Stark Klein, MD;  Location: [place];  Service: General;  Laterality: Left;  . BUNIONECTOMY Right   . HAMMER TOE SURGERY Bilateral   . RHINOPLASTY    . SEPTOPLASTY      There were no vitals filed for this visit.   Subjective Assessment - 10/13/19 1701    Subjective Feeling better with compression sleeve.  I can walk on it now.  No pain when resting.  Now feel it more where I tore my labrum.    Currently in Pain? No/denies                             [facility] Adult PT Treatment/Exercise - 10/13/19 0001      Exercises   Exercises Knee/Hip       Knee/Hip Exercises: Stretches   Active Hamstring Stretch Right;2 reps;20 seconds    Quad Stretch Right;1 rep;30 seconds    Piriformis Stretch Right;2 reps;20 seconds      Knee/Hip Exercises: Supine   Quad Sets Strengthening;Right;1 set;10 reps    [employer] Limitations 3 sec    Engineer, agricultural Limitations small - 8 reps    Straight Leg Raises Limitations 1 rep - unable to lift    Other Supine Knee/Hip Exercises hip adduction with towel roll and quad set - 8 reps 3 sec hold      Modalities   Modalities Vasopneumatic      Vasopneumatic   Number Minutes Vasopneumatic  10 minutes    Vasopnuematic Location  --   Rt thigh   Vasopneumatic Pressure Medium    Vasopneumatic Temperature  3 flakes                    PT Short Term Goals - 10/07/19 1407      PT SHORT TERM GOAL #1   Title Pt will demo consistency and independence with her HEP to decrease pain and improve  BLE strength.     Time 4    Period Weeks    Status New    Target Date 11/04/19      PT SHORT TERM GOAL #2   Title Report 25% less leg pain    Time 4    Period Weeks    Status New    Target Date 11/04/19      PT SHORT TERM GOAL #3   Title Pt will demonstrate at least 4/5 knee extension Rt side without pain for improved gait    Time 4    Period Weeks    Status New    Target Date 11/04/19             PT Long Term Goals - 10/07/19 1400      PT LONG TERM GOAL #1   Title Pt will demonstrate A/SLR 10x with pelvic stability and pain free for ability to perform functional activities such as bed mobility and car transfers.    Time 8    Status New    Target Date 12/02/19      PT LONG TERM GOAL #2   Title Pt will be able to walk at least 8 minutes without limp or antalgic gait due to improved quad strength    Baseline antalgice gait on Rt side    Time 8    Period Weeks    Status New    Target Date 12/02/19      PT LONG TERM GOAL #3   Title Pt will report atleast 75%  improvement in her symptoms to allow for her to resume walking activities without difficulty.    Time 8    Period Weeks    Status New    Target Date 12/02/19      PT LONG TERM GOAL #4   Title FOTO < or = to 29% limited    Baseline 51%    Time 8    Period Weeks    Status New    Target Date 12/02/19      PT LONG TERM GOAL #5   Title ind with advanced HEP    Time 8    Period Weeks    Status New    Target Date 12/02/19                 Plan - 10/13/19 1702    Clinical Impression Statement Pt did well with exercises but very easily fatigued.  She needed cues to not overstretch her muscles during stretches.  She had increased antalgic gait after the exercises.  Responded well to vaso for pain relief.  pt will benefit from skilled PT to continue working on functional strength and return to pain free and normal gait.    PT Treatment/Interventions ADLs/Self Care Home Management;Aquatic Therapy;Cryotherapy;Electrical Stimulation;Iontophoresis 4mg /ml Dexamethasone;Moist Heat;Traction;Neuromuscular re-education;Therapeutic exercise;Therapeutic activities;Ultrasound;Gait training;Patient/family education;Manual techniques;Dry needling;Passive range of motion;Taping    PT Next Visit Plan f/u on response to stim; review stretches, gentle quad strain; gentle STM; circumference measurement; try vaso for reduced edema    PT Home Exercise Plan Access Code: [email or web address]    Consulted and Agree with Plan of Care Patient           Patient will benefit from skilled therapeutic intervention in order to improve the following deficits and impairments:  Increased edema, Decreased strength, Difficulty walking, Abnormal gait, Increased fascial restricitons, Increased muscle spasms, Pain  Visit Diagnosis: Other muscle spasm  Muscle weakness (generalized)  Pain in right leg  Strain of  right quadriceps, initial encounter     Problem List Patient Active Problem List   Diagnosis Date Noted  .  Arthritis of right hip 09/17/2019  . Labral tear of right hip joint 08/06/2019  . Gluteal tendonitis of right buttock 05/22/2019  . Tear of left gluteus medius tendon 01/04/2019  . Arthritis of left hip 03/20/2018  . Greater trochanteric bursitis of right hip 06/13/2017  . 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 Breast cancer of upper-outer quadrant of left female breast ([place]) 12/07/2014  . Acute recurrent sinusitis 01/17/2013    Jule Ser, PT 10/13/2019, 5:05 PM  [facility] [address], [place], [place], [address] Phone: [phone]   Fax:  [phone]  Name: Christina [name] MRN: [id number] Date of Birth: 04-10-1951

## 2019-10-20 ENCOUNTER — Encounter: Payer: Self-pay | Admitting: Family Medicine

## 2019-10-20 ENCOUNTER — Ambulatory Visit: Payer: PPO | Admitting: Physical Therapy

## 2019-10-20 ENCOUNTER — Ambulatory Visit: Payer: PPO | Admitting: Family Medicine

## 2019-10-20 ENCOUNTER — Other Ambulatory Visit: Payer: Self-pay

## 2019-10-20 ENCOUNTER — Encounter: Payer: Self-pay | Admitting: Physical Therapy

## 2019-10-20 VITALS — BP 154/82 | HR 107 | Ht 63.0 in | Wt 115.0 lb

## 2019-10-20 DIAGNOSIS — S76111A Strain of right quadriceps muscle, fascia and tendon, initial encounter: Secondary | ICD-10-CM

## 2019-10-20 DIAGNOSIS — S76111D Strain of right quadriceps muscle, fascia and tendon, subsequent encounter: Secondary | ICD-10-CM | POA: Diagnosis not present

## 2019-10-20 DIAGNOSIS — M6281 Muscle weakness (generalized): Secondary | ICD-10-CM

## 2019-10-20 DIAGNOSIS — M62838 Other muscle spasm: Secondary | ICD-10-CM

## 2019-10-20 DIAGNOSIS — M79604 Pain in right leg: Secondary | ICD-10-CM

## 2019-10-20 NOTE — Progress Notes (Signed)
   I, Wendy Poet, LAT, ATC, am serving as scribe for Dr. Lynne Leader.  Christina [name] is a 69 y.o. female who presents to [place] [facility] today for f/u of R hip/thigh pain.  She was last seen by Dr. Georgina Snell on 10/06/19 after injuring herself stretching her hip flexors.  She was referred to PT of which she has completed 2 visits.  She was advised to use compression and ice.  She has had prior PRP injection to her R glute.  She has had lumbar nerve root injections and hip injections previously and had a R hip MRI on 08/03/19.  Since her last visit w/ Dr.Dijon Cosens, pt reports less pain in her R thigh.  She has been wearing a Body Helix compression sleeve.  She no longer has pain w/ walking and is only having pain w/ pressure to her R mid, medial quad.  Diagnostic testing: R hip MRI- 08/03/19; R hip XR- 07/02/19   Pertinent review of systems: No fevers or chills no fevers or chills  Relevant historical information: History of breast cancer   Exam:  BP (!) 154/82 (BP Location: Left Arm, Patient Position: Sitting, Cuff Size: Normal)   Pulse (!) 107   Ht 5\' 3"  (1.6 m)   Wt 115 lb (52.2 kg)   SpO2 98%   BMI 20.37 kg/m  General: Well Developed, well nourished, and in no acute distress.   MSK: Right thigh slight bruising present on medial thigh.  Area of firm mild tenderness present at medial to anterior thigh similar to previous visit.  No fluctuance palpated. Strength to resisted knee extension is improved without significant pain.     Assessment and Plan: 69 y.o. female with anterior medial thigh pain much improved with a bit of time and compression.  Plan to continue watchful waiting compression and some light physical therapy and home exercise program.  Recheck back with me or Dr. Tamala Julian as needed in the future.  Discussed the possible development into a seroma if so patient will return for aspiration if not improving.  Handout provided.    Discussed warning signs or  symptoms. Please see discharge instructions. Patient expresses understanding.   The above documentation has been reviewed and is accurate and complete Lynne Leader, M.D.  Total encounter time 20 minutes including charting time date of service. Discussed seroma as above

## 2019-10-20 NOTE — Therapy (Signed)
[facility] [address], [place], [place], [address] Phone: [phone]   Fax:  [phone]  Physical Therapy Treatment  Patient Details  Name: Christina [name] MRN: [id number] Date of Birth: April 09, 1951 Referring Provider (PT): Gregor Hams, MD   Encounter Date: 10/20/2019   PT End of Session - 10/20/19 1359    Visit Number 3    Date for PT Re-Evaluation 12/02/19    PT Start Time 1359    PT Stop Time 1439    PT Time Calculation (min) 40 min    Activity Tolerance Patient tolerated treatment well    Behavior During Therapy [facility] for tasks assessed/performed           Past Medical History:  Diagnosis Date  . Abrasion of right leg 12/17/2014  . Adrenal insufficiency ([place])   . Breast cancer of upper-outer quadrant of left female breast ([place]) 12/07/2014  . Cataract, immature    bilateral  . Dental bridge present    upper - x 2  . Dental crowns present   . Hypothyroidism   . Personal history of radiation therapy   . Prediabetes     Past Surgical History:  Procedure Laterality Date  . ABDOMINOPLASTY    . BALLOON SINUPLASTY    . BREAST LUMPECTOMY Left 2016  . BREAST LUMPECTOMY WITH RADIOACTIVE SEED AND SENTINEL LYMPH NODE BIOPSY Left 12/22/2014   Procedure: BREAST LUMPECTOMY WITH RADIOACTIVE SEED AND SENTINEL LYMPH NODE BIOPSY;  Surgeon: Stark Klein, MD;  Location: [place];  Service: General;  Laterality: Left;  . BUNIONECTOMY Right   . HAMMER TOE SURGERY Bilateral   . RHINOPLASTY    . SEPTOPLASTY      There were no vitals filed for this visit.   Subjective Assessment - 10/20/19 1447    Subjective States the quad has been feeling better and denies pain currently.    Patient Stated Goals be able to walk and lift my leg without pain    Currently in Pain? No/denies                             [facility] Adult PT Treatment/Exercise - 10/20/19 0001      Knee/Hip Exercises:  Stretches   Active Hamstring Stretch Right;2 reps;20 seconds    Quad Stretch Right;1 rep;30 seconds    Piriformis Stretch Right;2 reps;20 seconds    Gastroc Stretch Right;Left;2 reps;20 seconds    Other Knee/Hip Stretches AROM hip IR/ER with leg straight      Knee/Hip Exercises: Aerobic   Stationary Bike L1 x 2 min - stopped due to Rt hip pain      Knee/Hip Exercises: Seated   Long Arc Quad Strengthening;Right;15 reps      Knee/Hip Exercises: Supine   Quad Sets Strengthening;Right;1 set;10 reps    [employer] Limitations with heel slide - hip started hurting    Other Supine Knee/Hip Exercises ball squeezes knees bend and knees straight      Manual Therapy   Manual Therapy Soft tissue mobilization    Soft tissue mobilization Rt quads and adductors - effleurage for edema management                    PT Short Term Goals - 10/20/19 1440      PT SHORT TERM GOAL #1   Title Pt will demo consistency and independence with her HEP to decrease pain and improve  BLE strength.     Baseline only did 1x    Status On-going      PT SHORT TERM GOAL #2   Title Report 25% less leg pain    Baseline it is better    Status On-going      PT SHORT TERM GOAL #3   Title Pt will demonstrate at least 4/5 knee extension Rt side without pain for improved gait    Status On-going             PT Long Term Goals - 10/07/19 1400      PT LONG TERM GOAL #1   Title Pt will demonstrate A/SLR 10x with pelvic stability and pain free for ability to perform functional activities such as bed mobility and car transfers.    Time 8    Status New    Target Date 12/02/19      PT LONG TERM GOAL #2   Title Pt will be able to walk at least 8 minutes without limp or antalgic gait due to improved quad strength    Baseline antalgice gait on Rt side    Time 8    Period Weeks    Status New    Target Date 12/02/19      PT LONG TERM GOAL #3   Title Pt will report atleast 75% improvement in her symptoms to  allow for her to resume walking activities without difficulty.    Time 8    Period Weeks    Status New    Target Date 12/02/19      PT LONG TERM GOAL #4   Title FOTO < or = to 29% limited    Baseline 51%    Time 8    Period Weeks    Status New    Target Date 12/02/19      PT LONG TERM GOAL #5   Title ind with advanced HEP    Time 8    Period Weeks    Status New    Target Date 12/02/19                 Plan - 10/20/19 1441    Clinical Impression Statement Pt was not able to progress HEP due to unable to do it much this week.  Pt was having more hip pain in Rt hip during exercises even when modified to be lower resistance.  Pt did well with SAQ and ball squeezes due to minimal hip movements. Pt responded well to effluerage massage techniques and reported that felt good.  cues to not over stretch were given during session.    PT Treatment/Interventions ADLs/Self Care Home Management;Aquatic Therapy;Cryotherapy;Electrical Stimulation;Iontophoresis 4mg /ml Dexamethasone;Moist Heat;Traction;Neuromuscular re-education;Therapeutic exercise;Therapeutic activities;Ultrasound;Gait training;Patient/family education;Manual techniques;Dry needling;Passive range of motion;Taping    PT Next Visit Plan gentle quad strength; LE stretching; gentle core and hip strength; progress as tolerated; pt did not like vaso    PT Home Exercise Plan Access Code: [email or web address]    Consulted and Agree with Plan of Care Patient           Patient will benefit from skilled therapeutic intervention in order to improve the following deficits and impairments:  Increased edema, Decreased strength, Difficulty walking, Abnormal gait, Increased fascial restricitons, Increased muscle spasms, Pain  Visit Diagnosis: Other muscle spasm  Muscle weakness (generalized)  Pain in right leg  Strain of right quadriceps, initial encounter     Problem List Patient Active Problem List   Diagnosis Date Noted  . Arthritis  of  right hip 09/17/2019  . Labral tear of right hip joint 08/06/2019  . Gluteal tendonitis of right buttock 05/22/2019  . Tear of left gluteus medius tendon 01/04/2019  . Arthritis of left hip 03/20/2018  . Greater trochanteric bursitis of right hip 06/13/2017  . 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 Breast cancer of upper-outer quadrant of left female breast ([place]) 12/07/2014  . Acute recurrent sinusitis 01/17/2013    Jule Ser, PT 10/20/2019, 2:48 PM  [facility] [address] [place], [place], [address] Phone: [phone]   Fax:  [phone]  Name: Christina [name] MRN: [id number] Date of Birth: Jan 01, 1951

## 2019-10-20 NOTE — Patient Instructions (Signed)
Thank you for coming in today. Continue home exercise and PT and compression.   Let me know if not better.    Seroma A seroma is a collection of fluid on the body that looks like swelling or a mass. Seromas form where tissue has been injured or cut. Seromas vary in size. Some are small and painless. Others may become large and cause pain or discomfort. Many seromas go away on their own as the fluid is naturally absorbed by the body, and some seromas need to be drained. What are the causes? Seromas form as the result of damage to tissue or the removal of tissue. This tissue damage may occur during surgery or because of an injury or trauma. When tissue is disrupted or removed, empty space is created. The body's natural defense system (immune system) causes fluid to enter the empty space and form a seroma. What are the signs or symptoms? Symptoms of this condition include:  Swelling at the site of a surgical cut (incision) or an injury.  Drainage of clear fluid at the surgery or injury site.  Discomfort or pain. How is this diagnosed? This condition is diagnosed based on your symptoms, your medical history, and a physical exam. During the exam, your health care provider will press on the seroma. You may also have tests, including:  Blood tests.  Imaging tests, such as an ultrasound or CT scan. How is this treated? Some seromas go away (resolve) on their own. Your health care provider may monitor you to make sure the seroma does not cause any complications. If your seroma does not resolve on its own, treatment may include:  Using a needle to drain the fluid from the seroma (needle aspiration).  Inserting a flexible tube (catheter) to drain the fluid.  Applying a bandage (dressing), such as an elastic bandage or binder.  Antibiotic medicines, if the seroma becomes infected. In rare cases, surgery may be done to remove the seroma and repair the area. Follow these instructions at  home:   If you were prescribed an antibiotic medicine, take it as told by your health care provider. Do not stop taking the antibiotic even if you start to feel better.  Return to your normal activities as told by your health care provider. Ask your health care provider what activities are safe for you.  Take over-the-counter and prescription medicines only as told by your health care provider.  Check your seroma every day for signs of infection. Check for: ? Redness or pain. ? Fluid or pus. ? More swelling. ? Warmth.  Keep all follow-up visits as told by your health care provider. This is important. Contact a health care provider if:  You have a fever.  You have redness or pain at the site of the seroma.  You have fluid or pus coming from the seroma.  Your seroma is more swollen or is getting bigger.  Your seroma is warm to the touch. This information is not intended to replace advice given to you by your health care provider. Make sure you discuss any questions you have with your health care provider. Document Revised: 03/30/2017 Document Reviewed: 01/28/2016 Elsevier Patient Education  2020 [employer].

## 2019-10-23 ENCOUNTER — Ambulatory Visit: Payer: PPO | Admitting: Family Medicine

## 2019-10-27 ENCOUNTER — Other Ambulatory Visit: Payer: Self-pay

## 2019-10-27 ENCOUNTER — Ambulatory Visit: Payer: PPO | Admitting: Physical Therapy

## 2019-10-27 DIAGNOSIS — M6281 Muscle weakness (generalized): Secondary | ICD-10-CM

## 2019-10-27 DIAGNOSIS — S76111A Strain of right quadriceps muscle, fascia and tendon, initial encounter: Secondary | ICD-10-CM

## 2019-10-27 DIAGNOSIS — M62838 Other muscle spasm: Secondary | ICD-10-CM | POA: Diagnosis not present

## 2019-10-27 DIAGNOSIS — M79604 Pain in right leg: Secondary | ICD-10-CM

## 2019-10-27 NOTE — Therapy (Signed)
[facility] [address] [place], [place], [address] Phone: [phone]   Fax:  [phone]  Physical Therapy Treatment  Patient Details  Name: RANDY WHITENER MRN: [id number] Date of Birth: 1950/11/21 Referring Provider (PT): Gregor Hams, MD   Encounter Date: 10/27/2019   PT End of Session - 10/27/19 1624    Visit Number 4    Date for PT Re-Evaluation 12/02/19    PT Start Time 7253    PT Stop Time 1457    PT Time Calculation (min) 40 min    Activity Tolerance Patient tolerated treatment well    Behavior During Therapy [facility] for tasks assessed/performed           Past Medical History:  Diagnosis Date  . Abrasion of right leg 12/17/2014  . Adrenal insufficiency ([place])   . Breast cancer of upper-outer quadrant of left female breast ([place]) 12/07/2014  . Cataract, immature    bilateral  . Dental bridge present    upper - x 2  . Dental crowns present   . Hypothyroidism   . Personal history of radiation therapy   . Prediabetes     Past Surgical History:  Procedure Laterality Date  . ABDOMINOPLASTY    . BALLOON SINUPLASTY    . BREAST LUMPECTOMY Left 2016  . BREAST LUMPECTOMY WITH RADIOACTIVE SEED AND SENTINEL LYMPH NODE BIOPSY Left 12/22/2014   Procedure: BREAST LUMPECTOMY WITH RADIOACTIVE SEED AND SENTINEL LYMPH NODE BIOPSY;  Surgeon: Stark Klein, MD;  Location: [place];  Service: General;  Laterality: Left;  . BUNIONECTOMY Right   . HAMMER TOE SURGERY Bilateral   . RHINOPLASTY    . SEPTOPLASTY      There were no vitals filed for this visit.   Subjective Assessment - 10/27/19 1625    Subjective Pt states it is just walking that hurts and the leg gets stiff and the Lt hip hurts.  I think it feels like the hip is more of the problem    How long can you walk comfortably? cannot walk comfortably    Patient Stated Goals be able to walk and lift my leg without pain    Currently in Pain?  No/denies                             [facility] Adult PT Treatment/Exercise - 10/27/19 0001      Knee/Hip Exercises: Stretches   Other Knee/Hip Stretches butterfly stretch      Knee/Hip Exercises: Aerobic   Stationary Bike L2 x 6 min - PT present for status      Knee/Hip Exercises: Standing   Gait Training heel strike    Other Standing Knee Exercises tandem standing with feet slightly apart - need oneUE support      Knee/Hip Exercises: Seated   Long Arc Quad Strengthening;Right;20 reps;Weights    Long Arc Quad Weight 2 lbs.      Knee/Hip Exercises: Supine   Short Arc Quad Sets Strengthening;Right;20 reps   2 lb   Engineer, agricultural Limitations small - 8 reps    Other Supine Knee/Hip Exercises ball squeezes    Other Supine Knee/Hip Exercises clam red band - 5 sec hold x 10      Manual Therapy   Soft tissue mobilization Rt quads and adductors - effleurage for edema management  PT Short Term Goals - 10/20/19 1440      PT SHORT TERM GOAL #1   Title Pt will demo consistency and independence with her HEP to decrease pain and improve BLE strength.     Baseline only did 1x    Status On-going      PT SHORT TERM GOAL #2   Title Report 25% less leg pain    Baseline it is better    Status On-going      PT SHORT TERM GOAL #3   Title Pt will demonstrate at least 4/5 knee extension Rt side without pain for improved gait    Status On-going             PT Long Term Goals - 10/07/19 1400      PT LONG TERM GOAL #1   Title Pt will demonstrate A/SLR 10x with pelvic stability and pain free for ability to perform functional activities such as bed mobility and car transfers.    Time 8    Status New    Target Date 12/02/19      PT LONG TERM GOAL #2   Title Pt will be able to walk at least 8 minutes without limp or antalgic gait due to improved quad strength    Baseline antalgice gait on Rt side    Time 8    Period Weeks     Status New    Target Date 12/02/19      PT LONG TERM GOAL #3   Title Pt will report atleast 75% improvement in her symptoms to allow for her to resume walking activities without difficulty.    Time 8    Period Weeks    Status New    Target Date 12/02/19      PT LONG TERM GOAL #4   Title FOTO < or = to 29% limited    Baseline 51%    Time 8    Period Weeks    Status New    Target Date 12/02/19      PT LONG TERM GOAL #5   Title ind with advanced HEP    Time 8    Period Weeks    Status New    Target Date 12/02/19                 Plan - 10/27/19 1725    Clinical Impression Statement Pt was able to do more today that last visit.  She tolerated exercises well and added more quad strengthening to HEP.  Pt continues to have no heel strike with gait due to quad weakness.  She will benefit from skilled PT to work on LE strength.    PT Treatment/Interventions ADLs/Self Care Home Management;Aquatic Therapy;Cryotherapy;Electrical Stimulation;Iontophoresis 4mg /ml Dexamethasone;Moist Heat;Traction;Neuromuscular re-education;Therapeutic exercise;Therapeutic activities;Ultrasound;Gait training;Patient/family education;Manual techniques;Dry needling;Passive range of motion;Taping    PT Next Visit Plan progress balance and quad strength; core and hip strength; heel srtrike gait    PT Home Exercise Plan Access Code: [email or web address]    Consulted and Agree with Plan of Care Patient           Patient will benefit from skilled therapeutic intervention in order to improve the following deficits and impairments:  Increased edema, Decreased strength, Difficulty walking, Abnormal gait, Increased fascial restricitons, Increased muscle spasms, Pain  Visit Diagnosis: Other muscle spasm  Muscle weakness (generalized)  Pain in right leg  Strain of right quadriceps, initial encounter     Problem List Patient Active Problem List   Diagnosis  Date Noted  . Arthritis of right hip 09/17/2019  .  Labral tear of right hip joint 08/06/2019  . Gluteal tendonitis of right buttock 05/22/2019  . Tear of left gluteus medius tendon 01/04/2019  . Arthritis of left hip 03/20/2018  . Greater trochanteric bursitis of right hip 06/13/2017  . 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 Breast cancer of upper-outer quadrant of left female breast ([place]) 12/07/2014  . Acute recurrent sinusitis 01/17/2013    Jule Ser, PT 10/27/2019, 5:33 PM  [facility] [address] [place], [place], [address] Phone: [phone]   Fax:  [phone]  Name: VERONNICA HENNINGS MRN: [id number] Date of Birth: May 01, 1951

## 2019-11-10 DIAGNOSIS — H52203 Unspecified astigmatism, bilateral: Secondary | ICD-10-CM | POA: Diagnosis not present

## 2019-11-10 DIAGNOSIS — H0100A Unspecified blepharitis right eye, upper and lower eyelids: Secondary | ICD-10-CM | POA: Diagnosis not present

## 2019-11-10 DIAGNOSIS — H04123 Dry eye syndrome of bilateral lacrimal glands: Secondary | ICD-10-CM | POA: Diagnosis not present

## 2019-11-10 DIAGNOSIS — Z961 Presence of intraocular lens: Secondary | ICD-10-CM | POA: Diagnosis not present

## 2019-11-10 DIAGNOSIS — H43813 Vitreous degeneration, bilateral: Secondary | ICD-10-CM | POA: Diagnosis not present

## 2019-11-11 ENCOUNTER — Ambulatory Visit: Payer: PPO | Attending: Family Medicine | Admitting: Physical Therapy

## 2019-11-11 ENCOUNTER — Other Ambulatory Visit: Payer: Self-pay

## 2019-11-11 DIAGNOSIS — S76111A Strain of right quadriceps muscle, fascia and tendon, initial encounter: Secondary | ICD-10-CM | POA: Diagnosis not present

## 2019-11-11 DIAGNOSIS — M6281 Muscle weakness (generalized): Secondary | ICD-10-CM

## 2019-11-11 DIAGNOSIS — M62838 Other muscle spasm: Secondary | ICD-10-CM | POA: Diagnosis not present

## 2019-11-11 DIAGNOSIS — M79604 Pain in right leg: Secondary | ICD-10-CM

## 2019-11-11 NOTE — Patient Instructions (Signed)
Access Code: [email or web address] URL: [email or web address] Date: 11/11/2019 Prepared by: Jari Favre  Exercises Standing Hamstring Stretch with Step - 1 x daily - 7 x weekly - 3 reps - 1 sets - 30 sec hold Supine Hamstring Stretch - 1 x daily - 7 x weekly - 3 reps - 1 sets - 30 sec hold Prone Quad Stretch with Towel Roll and Strap - 1 x daily - 7 x weekly - 10 reps - 3 sets Hooklying Small March - 1 x daily - 7 x weekly - 10 reps - 2 sets Beginner Bridge - 1 x daily - 7 x weekly - 2 sets - 10 reps Supine Quad Set - 1 x daily - 7 x weekly - 10 reps - 3 sets Supine Straight Leg Hip Adduction and Quad Set with Ball - 1 x daily - 7 x weekly - 10 reps - 3 sets Seated Long Arc Quad - 1 x daily - 7 x weekly - 3 sets - 10 reps Heel Toe Raises with Counter Support - 1 x daily - 7 x weekly - 3 sets - 10 reps Standing Hamstring Curl with Chair Support - 1 x daily - 7 x weekly - 3 sets - 10 reps

## 2019-11-11 NOTE — Therapy (Signed)
[facility] [address], [place], [place], [address] Phone: [phone]   Fax:  [phone]  Physical Therapy Treatment  Patient Details  Name: Christina [name] MRN: [id number] Date of Birth: Mar 29, 1951 Referring Provider (PT): Gregor Hams, MD   Encounter Date: 11/11/2019   PT End of Session - 11/11/19 1141    Visit Number 5    Date for PT Re-Evaluation 12/02/19    PT Start Time 1100    PT Stop Time 1138    PT Time Calculation (min) 38 min    Activity Tolerance Patient tolerated treatment well    Behavior During Therapy [facility] for tasks assessed/performed           Past Medical History:  Diagnosis Date  . Abrasion of right leg 12/17/2014  . Adrenal insufficiency ([place])   . Breast cancer of upper-outer quadrant of left female breast ([place]) 12/07/2014  . Cataract, immature    bilateral  . Dental bridge present    upper - x 2  . Dental crowns present   . Hypothyroidism   . Personal history of radiation therapy   . Prediabetes     Past Surgical History:  Procedure Laterality Date  . ABDOMINOPLASTY    . BALLOON SINUPLASTY    . BREAST LUMPECTOMY Left 2016  . BREAST LUMPECTOMY WITH RADIOACTIVE SEED AND SENTINEL LYMPH NODE BIOPSY Left 12/22/2014   Procedure: BREAST LUMPECTOMY WITH RADIOACTIVE SEED AND SENTINEL LYMPH NODE BIOPSY;  Surgeon: Stark Klein, MD;  Location: [place];  Service: General;  Laterality: Left;  . BUNIONECTOMY Right   . HAMMER TOE SURGERY Bilateral   . RHINOPLASTY    . SEPTOPLASTY      There were no vitals filed for this visit.   Subjective Assessment - 11/11/19 1103    Subjective Pt ambulates in with more antalgic gait.  States she worked out a lot on Sunday and is more sore. Pt mentioned seeing small stationary mark on the wall and floor appeared to be moving but states this is normal for her due to vertigo.    How long can you walk comfortably? cannot walk comfortably     Patient Stated Goals be able to walk and lift my leg without pain    Currently in Pain? Yes    Pain Score 7     Pain Location Knee    Pain Orientation Right    Pain Descriptors / Indicators Aching    Pain Type Acute pain    Pain Onset More than a month ago    Pain Frequency Intermittent    Aggravating Factors  walking    Multiple Pain Sites No                             [facility] Adult PT Treatment/Exercise - 11/11/19 0001      Ambulation/Gait   Gait Comments practiced heel to toe throughout the gym and cues to pick up speen and swing arms      Knee/Hip Exercises: Standing   Terminal Knee Extension Strengthening;Right;20 reps;Theraband    Theraband Level (Terminal Knee Extension) Level 3 (Green)    Hip Abduction Stengthening;Right;Left;10 reps   2lb    Abduction Limitations standing on foam mat    Other Standing Knee Exercises tandem standing with feet slightly apart - no UE support    Other Standing Knee Exercises narrow base of support head turns on foam mat  Knee/Hip Exercises: Supine   Short Arc Quad Sets Strengthening;Right;2 sets;15 reps   2 lb   Bridges Strengthening    Other Supine Knee/Hip Exercises clam green band - 2 x 10                  PT Education - 11/11/19 1141    Education Details Access Code: [email or web address]    Person(s) Educated Patient    Methods Explanation;Demonstration;Verbal cues;Handout    Comprehension Verbalized understanding;Returned demonstration            PT Short Term Goals - 10/20/19 1440      PT SHORT TERM GOAL #1   Title Pt will demo consistency and independence with her HEP to decrease pain and improve BLE strength.     Baseline only did 1x    Status On-going      PT SHORT TERM GOAL #2   Title Report 25% less leg pain    Baseline it is better    Status On-going      PT SHORT TERM GOAL #3   Title Pt will demonstrate at least 4/5 knee extension Rt side without pain for improved gait    Status On-going              PT Long Term Goals - 10/07/19 1400      PT LONG TERM GOAL #1   Title Pt will demonstrate A/SLR 10x with pelvic stability and pain free for ability to perform functional activities such as bed mobility and car transfers.    Time 8    Status New    Target Date 12/02/19      PT LONG TERM GOAL #2   Title Pt will be able to walk at least 8 minutes without limp or antalgic gait due to improved quad strength    Baseline antalgice gait on Rt side    Time 8    Period Weeks    Status New    Target Date 12/02/19      PT LONG TERM GOAL #3   Title Pt will report atleast 75% improvement in her symptoms to allow for her to resume walking activities without difficulty.    Time 8    Period Weeks    Status New    Target Date 12/02/19      PT LONG TERM GOAL #4   Title FOTO < or = to 29% limited    Baseline 51%    Time 8    Period Weeks    Status New    Target Date 12/02/19      PT LONG TERM GOAL #5   Title ind with advanced HEP    Time 8    Period Weeks    Status New    Target Date 12/02/19                 Plan - 11/11/19 1141    Clinical Impression Statement Pt was able to progress strength today.  She was notably fatigued at the end of treatment with her knee buckling when she stepped off the foam pad.  Pt was able to do more bridges when she had to stop due to pain after 8 the first time.  She was able to increase difficulty of the balance exercises.  Pt needed cues for heel strike during gait and improved when given cues.  She was educated on paying more attention to this when walking around the house    PT Treatment/Interventions ADLs/Self  Care Home Management;Aquatic Therapy;Cryotherapy;Electrical Stimulation;Iontophoresis 4mg /ml Dexamethasone;Moist Heat;Traction;Neuromuscular re-education;Therapeutic exercise;Therapeutic activities;Ultrasound;Gait training;Patient/family education;Manual techniques;Dry needling;Passive range of motion;Taping    PT Next Visit  Plan progress balance and quad strength; core and hip strength; heel srtrike gait    PT Home Exercise Plan Access Code: [email or web address]    Consulted and Agree with Plan of Care Patient           Patient will benefit from skilled therapeutic intervention in order to improve the following deficits and impairments:  Increased edema, Decreased strength, Difficulty walking, Abnormal gait, Increased fascial restricitons, Increased muscle spasms, Pain  Visit Diagnosis: Other muscle spasm  Muscle weakness (generalized)  Pain in right leg  Strain of right quadriceps, initial encounter     Problem List Patient Active Problem List   Diagnosis Date Noted  . Arthritis of right hip 09/17/2019  . Labral tear of right hip joint 08/06/2019  . Gluteal tendonitis of right buttock 05/22/2019  . Tear of left gluteus medius tendon 01/04/2019  . Arthritis of left hip 03/20/2018  . Greater trochanteric bursitis of right hip 06/13/2017  . 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 Breast cancer of upper-outer quadrant of left female breast ([place]) 12/07/2014  . Acute recurrent sinusitis 01/17/2013    Jule Ser, PT 11/11/2019, 1:18 PM  [facility] [address], [place], [place], [address] Phone: [phone]   Fax:  [phone]  Name: Christina [name] MRN: [id number] Date of Birth: 1951-01-15

## 2019-11-17 ENCOUNTER — Other Ambulatory Visit: Payer: Self-pay

## 2019-11-17 ENCOUNTER — Encounter: Payer: Self-pay | Admitting: Physical Therapy

## 2019-11-17 ENCOUNTER — Ambulatory Visit: Payer: PPO | Admitting: Physical Therapy

## 2019-11-17 DIAGNOSIS — M79604 Pain in right leg: Secondary | ICD-10-CM

## 2019-11-17 DIAGNOSIS — M6281 Muscle weakness (generalized): Secondary | ICD-10-CM

## 2019-11-17 DIAGNOSIS — M62838 Other muscle spasm: Secondary | ICD-10-CM

## 2019-11-17 DIAGNOSIS — S76111A Strain of right quadriceps muscle, fascia and tendon, initial encounter: Secondary | ICD-10-CM

## 2019-11-17 NOTE — Therapy (Addendum)
[facility] [address], [address] [place], [place], [address] Phone: [phone]   Fax:  [phone]  Physical Therapy Treatment  Patient Details  Name: Christina [name] MRN: [id number] Date of Birth: 05/31/1950 Referring Provider (PT): Corey, Evan S, MD   Encounter Date: 11/17/2019   PT End of Session - 11/17/19 1102    Visit Number 6    Date for PT Re-Evaluation 12/02/19    PT Start Time 1056    PT Stop Time 1138    PT Time Calculation (min) 42 min    Activity Tolerance Patient tolerated treatment well    Behavior During Therapy WFL for tasks assessed/performed           Past Medical History:  Diagnosis Date  . Abrasion of right leg 12/17/2014  . Adrenal insufficiency (HCC)   . Breast cancer of upper-outer quadrant of left female breast (HCC) 12/07/2014  . Cataract, immature    bilateral  . Dental bridge present    upper - x 2  . Dental crowns present   . Hypothyroidism   . Personal history of radiation therapy   . Prediabetes     Past Surgical History:  Procedure Laterality Date  . ABDOMINOPLASTY    . BALLOON SINUPLASTY    . BREAST LUMPECTOMY Left 2016  . BREAST LUMPECTOMY WITH RADIOACTIVE SEED AND SENTINEL LYMPH NODE BIOPSY Left 12/22/2014   Procedure: BREAST LUMPECTOMY WITH RADIOACTIVE SEED AND SENTINEL LYMPH NODE BIOPSY;  Surgeon: Faera Byerly, MD;  Location: [facility];  Service: General;  Laterality: Left;  . BUNIONECTOMY Right   . HAMMER TOE SURGERY Bilateral   . RHINOPLASTY    . SEPTOPLASTY      There were no vitals filed for this visit.   Subjective Assessment - 11/17/19 1102    Patient Stated Goals be able to walk and lift my leg without pain    Currently in Pain? Yes    Pain Score 2     Pain Location Leg    Pain Orientation Right    Pain Descriptors / Indicators Aching    Pain Type Acute pain    Pain Radiating Towards thigh and groin    Pain Onset More than a month ago     Pain Frequency Intermittent    Aggravating Factors  walking    Multiple Pain Sites No                             [facility] Adult PT Treatment/Exercise - 11/17/19 0001      Ambulation/Gait   Gait Comments practiced heel to toe throughout the gym and cues to pick up speen and swing arms      Knee/Hip Exercises: Aerobic   Stationary Bike L2 x 6 min - PT present for status      Knee/Hip Exercises: Standing   Knee Flexion Strengthening;Right;Left;10 reps   2lb   Hip Flexion Right;Left;10 reps;Knee bent   2 lb   Terminal Knee Extension Strengthening;Right;20 reps;Theraband    Theraband Level (Terminal Knee Extension) Level 4 (Blue)    Hip Abduction Stengthening;Right;Left;10 reps   2lb    Abduction Limitations standing on foam mat    Other Standing Knee Exercises tandem standing with feet slightly apart - no UE support    Other Standing Knee Exercises narrow base of support head turns on foam mat      Knee/Hip Exercises: Seated   Long   [employer] Strengthening;Right;20 reps;Weights    Long Arc Quad Weight 2 lbs.      Knee/Hip Exercises: Supine   Short Arc Quad Sets Strengthening;Right;2 sets;15 reps   3 lb   Educational psychologist Leg Raises Strengthening;Right;10 reps    Other Supine Knee/Hip Exercises ball squeezes 20x    Other Supine Knee/Hip Exercises clam green band - 2 x 10      Manual Therapy   Soft tissue mobilization Rt quads and adductors - trigger point and adhesion release                    PT Short Term Goals - 11/17/19 1143      PT SHORT TERM GOAL #1   Title Pt will demo consistency and independence with her HEP to decrease pain and improve BLE strength.     Status Achieved      PT SHORT TERM GOAL #2   Title Report 25% less leg pain    Baseline reports a lot less today    Status Achieved      PT SHORT TERM GOAL #3   Title Pt will demonstrate at least 4/5 knee extension Rt side without pain for improved gait    Status  On-going             PT Long Term Goals - 10/07/19 1400      PT LONG TERM GOAL #1   Title Pt will demonstrate A/SLR 10x with pelvic stability and pain free for ability to perform functional activities such as bed mobility and car transfers.    Time 8    Status New    Target Date 12/02/19      PT LONG TERM GOAL #2   Title Pt will be able to walk at least 8 minutes without limp or antalgic gait due to improved quad strength    Baseline antalgice gait on Rt side    Time 8    Period Weeks    Status New    Target Date 12/02/19      PT LONG TERM GOAL #3   Title Pt will report atleast 75% improvement in her symptoms to allow for her to resume walking activities without difficulty.    Time 8    Period Weeks    Status New    Target Date 12/02/19      PT LONG TERM GOAL #4   Title FOTO < or = to 29% limited    Baseline 51%    Time 8    Period Weeks    Status New    Target Date 12/02/19      PT LONG TERM GOAL #5   Title ind with advanced HEP    Time 8    Period Weeks    Status New    Target Date 12/02/19                 Plan - 11/17/19 1140    Clinical Impression Statement Pt did well with exercise progressions today.  Pt was able to add resistance.  PT noticed less tension and smaller adhesions with palpation and STM to quads and adductors helped to reduce adhesions.  Pt was walking better with more heel strike but still lacks full extension of her Rt knee during gait.  It improves slightly when she has a lot of verbal cues.    PT Treatment/Interventions ADLs/Self Care Home Management;Aquatic Therapy;Cryotherapy;Electrical Stimulation;Iontophoresis 11m/ml Dexamethasone;Moist Heat;Traction;Neuromuscular re-education;Therapeutic exercise;Therapeutic activities;Ultrasound;Gait  training;Patient/family education;Manual techniques;Dry needling;Passive range of motion;Taping    PT Next Visit Plan progress balance and quad strength; core and hip strength; heel srtrike gait     PT Home Exercise Plan Access Code: [email or web address]    Consulted and Agree with Plan of Care Patient           Patient will benefit from skilled therapeutic intervention in order to improve the following deficits and impairments:  Increased edema, Decreased strength, Difficulty walking, Abnormal gait, Increased fascial restricitons, Increased muscle spasms, Pain  Visit Diagnosis: Other muscle spasm  Muscle weakness (generalized)  Pain in right leg  Strain of right quadriceps, initial encounter     Problem List Patient Active Problem List   Diagnosis Date Noted  . Arthritis of right hip 09/17/2019  . Labral tear of right hip joint 08/06/2019  . Gluteal tendonitis of right buttock 05/22/2019  . Tear of left gluteus medius tendon 01/04/2019  . Arthritis of left hip 03/20/2018  . Greater trochanteric bursitis of right hip 06/13/2017  . 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 Breast cancer of upper-outer quadrant of left female breast ([place]) 12/07/2014  . Acute recurrent sinusitis 01/17/2013    Jule Ser, PT 11/17/2019, 11:45 AM  [facility] [address] [place], [place], [address] Phone: [phone]   Fax:  [phone]  Name: SCHUYLER BEHAN MRN: [id number] Date of Birth: 03-21-1951  PHYSICAL THERAPY DISCHARGE SUMMARY  Visits from Start of Care: 6  Current functional level related to goals / functional outcomes: See above goals   Remaining deficits: See above   Education / Equipment: HEP  Plan: Patient agrees to discharge.  Patient goals were not met. Patient is being discharged due to not returning since the last visit.  ?????    [employer], PT 01/28/20 8:11 AM

## 2019-11-18 ENCOUNTER — Ambulatory Visit: Payer: PPO | Admitting: Family Medicine

## 2019-11-18 ENCOUNTER — Ambulatory Visit: Payer: Self-pay

## 2019-11-18 ENCOUNTER — Encounter: Payer: Self-pay | Admitting: Family Medicine

## 2019-11-18 VITALS — BP 148/68 | HR 119 | Ht 63.0 in | Wt 115.4 lb

## 2019-11-18 DIAGNOSIS — L2489 Irritant contact dermatitis due to other agents: Secondary | ICD-10-CM

## 2019-11-18 DIAGNOSIS — S76111D Strain of right quadriceps muscle, fascia and tendon, subsequent encounter: Secondary | ICD-10-CM | POA: Diagnosis not present

## 2019-11-18 MED ORDER — TRIAMCINOLONE ACETONIDE 0.1 % EX CREA: 1.0000 "application " | TOPICAL_CREAM | Freq: Two times a day (BID) | CUTANEOUS | 1 refills | Status: DC

## 2019-11-18 NOTE — Patient Instructions (Signed)
Thank you for coming in today. Apply the triamcinolone cream up to 2-3x daily for rash until settled down. May need to restart if it comes back.  Ok to also use gold bond itch or any other anti itch cream that contains Pramoxine hydrochloride 1%.   Continue PT and home exercises.   Recheck in about 1 month if needed.

## 2019-11-18 NOTE — Progress Notes (Signed)
   I, Wendy Poet, LAT, ATC, am serving as scribe for Dr. Lynne Leader.  Christina [name] is a 69 y.o. female who presents to [place] at [facility] today for a "knot on her R thigh."  She was last seen by Dr. Georgina Snell on 10/06/19 for f/u of R hip/glute pain.  She has completed 6 PT sessions for her R leg w/ the most recent one being yesterday, 11/17/19.  Today, she notes a bump on her R medial thigh that began itching and has been present for approximately 6 weeks.  She reports warmth to the touch in that area.  She has been wearing her Body Helix thigh sleeve.   Pertinent review of systems: No fevers or chills  Relevant historical information: Hypothyroidism history of adrenal insufficiency   Exam:  BP (!) 148/68 (BP Location: Right Arm, Patient Position: Sitting, Cuff Size: Normal)   Pulse (!) 119   Ht 5\' 3"  (1.6 m)   Wt 115 lb 6.4 oz (52.3 kg)   SpO2 96%   BMI 20.44 kg/m  General: Well Developed, well nourished, and in no acute distress.   MSK: Right thigh skin is slightly erythematous and nontender.  Normal hip and knee motion.      Assessment and Plan: 69 y.o. female with contact irritant dermatitis likely secondary to compression sleeve.  Plan to treat with triamcinolone cream and Goldbond itch.  Continue physical therapy and home exercises for muscle strain/tear.  Recheck back in about a month.    Meds ordered this encounter  Medications  . triamcinolone cream (KENALOG) 0.1 %    Sig: Apply 1 application topically 2 (two) times daily.    Dispense:  453.6 g    Refill:  1     Discussed warning signs or symptoms. Please see discharge instructions. Patient expresses understanding.   The above documentation has been reviewed and is accurate and complete Lynne Leader, M.D.

## 2019-11-20 DIAGNOSIS — M8589 Other specified disorders of bone density and structure, multiple sites: Secondary | ICD-10-CM | POA: Diagnosis not present

## 2019-11-20 DIAGNOSIS — M76891 Other specified enthesopathies of right lower limb, excluding foot: Secondary | ICD-10-CM | POA: Diagnosis not present

## 2019-11-20 DIAGNOSIS — M25552 Pain in left hip: Secondary | ICD-10-CM | POA: Diagnosis not present

## 2019-11-20 DIAGNOSIS — M25551 Pain in right hip: Secondary | ICD-10-CM | POA: Diagnosis not present

## 2019-11-20 DIAGNOSIS — M160 Bilateral primary osteoarthritis of hip: Secondary | ICD-10-CM | POA: Diagnosis not present

## 2019-11-20 DIAGNOSIS — M76892 Other specified enthesopathies of left lower limb, excluding foot: Secondary | ICD-10-CM | POA: Diagnosis not present

## 2019-11-24 ENCOUNTER — Other Ambulatory Visit: Payer: Self-pay | Admitting: General Surgery

## 2019-11-24 DIAGNOSIS — L821 Other seborrheic keratosis: Secondary | ICD-10-CM | POA: Diagnosis not present

## 2019-11-24 DIAGNOSIS — Z9889 Other specified postprocedural states: Secondary | ICD-10-CM

## 2019-11-24 DIAGNOSIS — D1801 Hemangioma of skin and subcutaneous tissue: Secondary | ICD-10-CM | POA: Diagnosis not present

## 2019-11-24 DIAGNOSIS — L570 Actinic keratosis: Secondary | ICD-10-CM | POA: Diagnosis not present

## 2019-11-24 DIAGNOSIS — D485 Neoplasm of uncertain behavior of skin: Secondary | ICD-10-CM | POA: Diagnosis not present

## 2019-11-24 DIAGNOSIS — C44529 Squamous cell carcinoma of skin of other part of trunk: Secondary | ICD-10-CM | POA: Diagnosis not present

## 2019-11-24 DIAGNOSIS — Z85828 Personal history of other malignant neoplasm of skin: Secondary | ICD-10-CM | POA: Diagnosis not present

## 2019-11-24 DIAGNOSIS — L308 Other specified dermatitis: Secondary | ICD-10-CM | POA: Diagnosis not present

## 2019-11-24 DIAGNOSIS — Z853 Personal history of malignant neoplasm of breast: Secondary | ICD-10-CM

## 2019-12-12 DIAGNOSIS — M160 Bilateral primary osteoarthritis of hip: Secondary | ICD-10-CM | POA: Diagnosis not present

## 2019-12-16 ENCOUNTER — Ambulatory Visit: Payer: PPO | Admitting: Family Medicine

## 2019-12-16 ENCOUNTER — Encounter: Payer: Self-pay | Admitting: Family Medicine

## 2019-12-16 ENCOUNTER — Other Ambulatory Visit: Payer: Self-pay

## 2019-12-16 VITALS — BP 130/70 | HR 98 | Ht 63.0 in | Wt 114.0 lb

## 2019-12-16 DIAGNOSIS — S76111D Strain of right quadriceps muscle, fascia and tendon, subsequent encounter: Secondary | ICD-10-CM | POA: Diagnosis not present

## 2019-12-16 NOTE — Patient Instructions (Signed)
Thank you for coming in today. Give the thigh a continued chance to improve.  If not better in 6-12 weeks return for [place] evaluation and possible aspiration and injection.

## 2019-12-16 NOTE — Progress Notes (Signed)
   I, Wendy Poet, LAT, ATC, am serving as scribe for Dr. Lynne Leader.  Christina [name] is a 69 y.o. female who presents to [place] at [facility] today for f/u of R thigh pain after injuring herself while trying to stretch her hip flexors in early June 2021.  She was last seen by Dr. Georgina Snell on 10/20/19 and was advised to con't w/ PT and con't using the Body Helix thigh sleeve.  She has completed 6 PT visits.  Since her last visit, pt reports that her R thigh knot/lump remains about the same but states that she is not having any pain in the area.  She con't to wear her thigh compression sleeve.  She does not have any more PT sessions scheduled  Diagnostic imaging: R hip MRI- 08/03/19; R hip XR- 07/02/19  Pertinent review of systems: No fevers or chills  Relevant historical information: History of breast cancer   Exam:  BP 130/70 (BP Location: Right Arm, Patient Position: Sitting, Cuff Size: Normal)   Pulse 98   Ht 5\' 3"  (1.6 m)   Wt 114 lb (51.7 kg)   SpO2 96%   BMI 20.19 kg/m  General: Well Developed, well nourished, and in no acute distress.   MSK: Right thigh nontender normal motion.      Assessment and Plan: 69 y.o. female with resolving seroma right thigh.  Significant improvement with time physical therapy compression.  Plan to give this a bit longer to improve.  If it still swollen in 6-12 more weeks we will consider aspiration injection.  Recheck back as needed.  Discussed warning signs or symptoms. Please see discharge instructions. Patient expresses understanding.   The above documentation has been reviewed and is accurate and complete Lynne Leader, M.D.

## 2019-12-17 ENCOUNTER — Ambulatory Visit
Admission: RE | Admit: 2019-12-17 | Discharge: 2019-12-17 | Disposition: A | Discharge status: Home / Self Care | Payer: PPO | Source: Ambulatory Visit | Attending: General Surgery | Admitting: General Surgery

## 2019-12-17 ENCOUNTER — Other Ambulatory Visit: Payer: Self-pay

## 2019-12-17 DIAGNOSIS — R928 Other abnormal and inconclusive findings on diagnostic imaging of breast: Secondary | ICD-10-CM | POA: Diagnosis not present

## 2019-12-17 DIAGNOSIS — Z853 Personal history of malignant neoplasm of breast: Secondary | ICD-10-CM | POA: Diagnosis not present

## 2019-12-17 DIAGNOSIS — Z9889 Other specified postprocedural states: Secondary | ICD-10-CM

## 2019-12-29 DIAGNOSIS — Z853 Personal history of malignant neoplasm of breast: Secondary | ICD-10-CM | POA: Diagnosis not present

## 2020-01-27 ENCOUNTER — Encounter: Payer: Self-pay | Admitting: Family Medicine

## 2020-01-27 ENCOUNTER — Ambulatory Visit: Payer: PPO | Admitting: Family Medicine

## 2020-01-27 ENCOUNTER — Ambulatory Visit: Payer: Self-pay

## 2020-01-27 VITALS — BP 124/80 | HR 103 | Ht 63.0 in | Wt 111.6 lb

## 2020-01-27 DIAGNOSIS — R29898 Other symptoms and signs involving the musculoskeletal system: Secondary | ICD-10-CM | POA: Diagnosis not present

## 2020-01-27 DIAGNOSIS — M79651 Pain in right thigh: Secondary | ICD-10-CM | POA: Diagnosis not present

## 2020-01-27 DIAGNOSIS — S3421XD Injury of nerve root of lumbar spine, subsequent encounter: Secondary | ICD-10-CM | POA: Diagnosis not present

## 2020-01-27 NOTE — Progress Notes (Signed)
I, Wendy Poet, LAT, ATC, am serving as scribe for Dr. Lynne Leader.  Christina [name] is a 69 y.o. female who presents to [place] at [facility] today for f/u of R quad knot/nodule.  She was last seen by Dr. Georgina Snell on 12/16/19 for f/u of a R thigh injury that occurred in early June 2021 while she was stretching her hip flexor.  She was feeling much better in terms of pain, having no pain at her last visit but con't to note a knot/lump in her R thigh.  Since her last visit, pt reports that her R thigh feels weak.  She denies any actual pain in her thigh and feels like she may still have a small nodule along her R medial quad/thigh.  She states that she feels like she has a hard time picking up her R foot when she walks and feels this may be associated w/ reported R thigh weakness.  She does have a history of right L3 nerve root impingement and epidural steroid injections in 2018 and 2019.  Diagnostic imaging: R hip MRI- 08/03/19; R hip XR- 07/02/19  Pertinent review of systems: No fevers or chills  Relevant historical information: As above right lumbar radiculopathy   Exam:  BP 124/80 (BP Location: Right Arm, Patient Position: Sitting, Cuff Size: Normal)   Pulse (!) 103   Ht 5\' 3"  (1.6 m)   Wt 111 lb 9.6 oz (50.6 kg)   SpO2 98%   BMI 19.77 kg/m  General: Well Developed, well nourished, and in no acute distress.   MSK: Right leg normal-appearing without significant swelling or tenderness at mid thigh.   Right lower extremity strength 4/5 to hip flexion otherwise intact and normal. No foot dorsiflexion weakness. Gait: Occasionally dragging foot due to lack of full hip flexion. No dropfoot gait.    Lab and Radiology Results MRI lumbar spine 11/02/2016 IMPRESSION: 1. Unchanged right foraminal and extraforaminal disc extrusion at L3-4 with right L3 nerve root impingement. 2. Mild right and mild-to-moderate left foraminal stenosis at L4-5. Mildly regressed left  foraminal/extraforaminal disc protrusion. 3. Unchanged lateral recess stenosis at L2-3. 4. Unchanged 4 mm right extraforaminal abnormality at L1-2, possibly a regressed disc protrusion or residua of a prior hemorrhage (less likely nerve sheath tumor given regression).   Electronically Signed   By: Logan Bores M.D.   On: 11/02/2016 12:52  I, Lynne Leader, personally (independently) visualized and performed the interpretation of the images attached in this note.      Assessment and Plan: 69 y.o. female with right hip flexion weakness.  Patient has had tendinitis of the gluteus medius hip abductors and rotators.  She also very likely has had some tendinitis of the hip flexors in the past as well.  She recently had an injury of her quadricep which contributed to this persistent weakness that she has as well.  However these have been pretty well addressed recently and given the lack of significant pain I think main issue is L3 nerve weakness.  She has had problems with this in the past.  Discussed options.  Plan for dedicated physical therapy for hip flexor weakness and backup plan to proceed with epidural steroid injection if that does not work well enough. Exacerbation of chronic problem.  As for the hematoma/seroma this has now resolved.    Orders Placed This Encounter  Procedures  . [place] LIMITED JOINT SPACE STRUCTURES LOW RIGHT(NO LINKED CHARGES)    Order Specific Question:  Reason for Exam (SYMPTOM  OR DIAGNOSIS REQUIRED)    Answer:   R thigh weakness    Order Specific Question:   Preferred imaging location?    Answer:   [place]  . Ambulatory referral to Physical Therapy    Referral Priority:   Routine    Referral Type:   Physical Medicine    Referral Reason:   Specialty Services Required    Requested Specialty:   Physical Therapy   No orders of the defined types were placed in this encounter.    Discussed warning signs or symptoms. Please see  discharge instructions. Patient expresses understanding.   The above documentation has been reviewed and is accurate and complete Lynne Leader, M.D.

## 2020-01-27 NOTE — Patient Instructions (Signed)
Thank you for coming in today. Plan for PT to work on hip flexor weakness.   We can try a repeat epidural steroid injection at Right L3 at any time either now or after trial of PT.   Reasses after about 1 month of PT.   Keep me updated.   Use the sleeve as needed.

## 2020-01-28 ENCOUNTER — Encounter: Payer: Self-pay | Admitting: Physical Therapy

## 2020-01-28 ENCOUNTER — Ambulatory Visit: Payer: PPO | Attending: Family Medicine | Admitting: Physical Therapy

## 2020-01-28 ENCOUNTER — Other Ambulatory Visit: Payer: Self-pay

## 2020-01-28 DIAGNOSIS — M6281 Muscle weakness (generalized): Secondary | ICD-10-CM | POA: Diagnosis not present

## 2020-01-28 DIAGNOSIS — M25551 Pain in right hip: Secondary | ICD-10-CM

## 2020-01-28 DIAGNOSIS — R2689 Other abnormalities of gait and mobility: Secondary | ICD-10-CM | POA: Diagnosis not present

## 2020-01-28 NOTE — Therapy (Signed)
[facility] [address], [place], [place], [address] Phone: [phone]   Fax:  [phone]  Physical Therapy Evaluation  Patient Details  Name: Christina [name] MRN: [id number] Date of Birth: 06/21/1950 Referring Provider (PT): Gregor Hams, MD   Encounter Date: 01/28/2020   PT End of Session - 01/28/20 1320    Visit Number 1    Date for PT Re-Evaluation 04/21/20    Authorization Type [employer]    Progress Note Due on Visit 10    PT Start Time 5701    PT Stop Time 1100    PT Time Calculation (min) 45 min    Activity Tolerance Patient tolerated treatment well    Behavior During Therapy [facility] for tasks assessed/performed           Past Medical History:  Diagnosis Date  . Abrasion of right leg 12/17/2014  . Adrenal insufficiency ([place])   . Breast cancer of upper-outer quadrant of left female breast ([place]) 12/07/2014  . Cataract, immature    bilateral  . Dental bridge present    upper - x 2  . Dental crowns present   . Hypothyroidism   . Personal history of radiation therapy   . Prediabetes     Past Surgical History:  Procedure Laterality Date  . ABDOMINOPLASTY    . BALLOON SINUPLASTY    . BREAST LUMPECTOMY Left 2016  . BREAST LUMPECTOMY WITH RADIOACTIVE SEED AND SENTINEL LYMPH NODE BIOPSY Left 12/22/2014   Procedure: BREAST LUMPECTOMY WITH RADIOACTIVE SEED AND SENTINEL LYMPH NODE BIOPSY;  Surgeon: Stark Klein, MD;  Location: [place];  Service: General;  Laterality: Left;  . BUNIONECTOMY Right   . HAMMER TOE SURGERY Bilateral   . RHINOPLASTY    . SEPTOPLASTY      There were no vitals filed for this visit.    Subjective Assessment - 01/28/20 1016    Subjective Pt is a return Pt with worsening of symptoms since travels to [place] and having to do a lot of stairs at a concert.  Rt anterior hip pain with walking and can't get heel strike - shuffles full foot.  I get really fatigued.   I can march to get my heel strike but then it gets tired and I can't.  I don't have pain but the weakness is troubling.    How long can you walk comfortably? cannot walk comfortably    Diagnostic tests MRI Rt hip 08/19/19: mod degen, spurring, superior labrum degen/torn, MRI lumbar 2018 multi-level disc dessication and lateral stenosis, right foraminal disc extrusion L3/4 with right L3 nerve root impingement    Patient Stated Goals be able to walk    Currently in Pain? No/denies    Aggravating Factors  walking fatigue              [facility] PT Assessment - 01/28/20 0001      Assessment   Medical Diagnosis S34.21XD (ICD-10-CM) - 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, subsequent encounter M79.651 (ICD-10-CM) - Right thigh pain, R29.898 (ICD-10-CM) - Weakness of right hip    Referring Provider (PT) Gregor Hams, MD    Onset Date/Surgical Date --   years and acute onset months ago   Next MD Visit --   1 month   Prior Therapy yes      Precautions   Precautions None      Restrictions   Weight Bearing Restrictions No  Balance Screen   Has the patient fallen in the past 6 months No    Has the patient had a decrease in activity level because of a fear of falling?  No    Is the patient reluctant to leave their home because of a fear of falling?  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      Observation/Other Assessments   Focus on Therapeutic Outcomes (FOTO)  deferred b/c Pt did not report pain but rather weakness      Functional Tests   Functional tests Sit to Stand      Sit to Stand   Comments able to  perform symmetrically x 5 reps      Posture/Postural Control   Posture/Postural Control Postural limitations    Postural Limitations Decreased lumbar lordosis      ROM / Strength   AROM / PROM / Strength Strength;AROM;PROM      AROM   Overall AROM Comments trunk and LEs WFL      PROM   PROM Assessment Site Hip    Right/Left Hip Right    Right Hip Flexion --   full no pain   Right Hip External Rotation  --   full no pain   Right Hip Internal Rotation  5      Strength   Overall Strength Comments Pt unable to initiate supine A/SLR on Rt, quad set is good but unable to lift from hip    Strength Assessment Site Hip;Knee    Right/Left Hip Right    Right Hip Flexion 4-/5   fatiguable weakness present with repeated reps   Right/Left Knee Right    Right Knee Extension 4-/5   fatiguable weakness present with repeated reps     Flexibility   Soft Tissue Assessment /Muscle Length yes    Hamstrings limited 20% bil      Palpation   Palpation comment non-tender throughout hip and thigh on Rt      Special Tests    Special Tests Hip Special Tests    Hip Special Tests  Saralyn Pilar (FABER) Test;Hip Scouring      Saralyn Pilar (FABER) Test   Findings Positive    Side Right    Comments mild pain      Hip Scouring   Findings Negative    Side Right      Ambulation/Gait   Ambulation/Gait Yes    Gait Pattern Step-through pattern;Step-to pattern    Gait Comments Rt hip flexion fatigues over time causing limited advancement of Rt LE with foot shuffling and step to gait, some stumbles as a result                      Objective measurements completed on examination: See above findings.       [place] Adult PT Treatment/Exercise - 01/28/20 0001      Self-Care   Self-Care Other Self-Care Comments    Other Self-Care Comments  lay down to unweight spine 2x/day x 10 min, do  HEP up to 3 times a day to fatigue, frequent walking for short duration for gait quality                   PT Education - 01/28/20 1338    Education Details Access Code: [email or web address]    Person(s) Educated Patient    Methods Explanation;Handout    Comprehension Verbalized understanding;Returned demonstration            PT Short Term Goals - 01/28/20 1403      PT SHORT TERM GOAL #1   Title Pt will be ind and consistent with high frequency low rep initial HEP to strengthen Rt hip and thigh with respect of Rt LE fatigue with ther ex.    Time 3    Period Weeks    Status New    Target Date 02/18/20      PT SHORT TERM GOAL #2   Title Pt will be compliant with unweighting spine 2x/day to see if this allows her greater functional strength of Rt LE throughout the day.    Baseline feels good in AM but signif fatigue in Rt hip flexor/quad as day progresses    Time 3    Period Weeks    Status New    Target Date 01/07/20      PT SHORT TERM GOAL #3   Title Pt will be able to perform step through gait with consistent heel strike on Rt LE x 80 feet    Time 4    Period Weeks    Status New    Target Date 02/25/20             PT Long Term Goals - 01/28/20 1406      PT LONG TERM GOAL #1   Title Pt with achieve Rt hip flexor and quad strength of at least 4+/5 for improved daily task endurance and ambulation.    Baseline -    Time 12    Period Weeks    Status New    Target Date 04/21/20      PT LONG TERM GOAL #2   Title Pt will be able to participate in 6 min walk test with step through gait pattern and consistent heel strike on Rt LE due to improved Rt LE hip advancement.    Baseline step through becomes step to with foot shuffle secondary to Rt hip flexor fatigue within 20-30 feet    Time 12    Period Weeks    Status New    Target Date 04/21/20      PT LONG TERM GOAL #3   Title Pt will report at least 70% improvement in Rt LE fatigue so that she may be more active throughout the day.    Baseline -    Time 12    Period Weeks    Status New    Target Date 04/21/20      PT LONG TERM  GOAL #4   Title Pt will be able to demo safety and stability with curbs and stairs for community ambulation and public access for outings.    Baseline -    Time 12    Period Weeks    Status New    Target Date 04/21/20      PT LONG TERM GOAL #5   Title Pt will be ind with advanced HEP    Baseline -    Time 12    Period Weeks    Status New  Target Date 04/21/20                  Plan - 01/28/20 1325    Clinical Impression Statement Pt is a pleasant 69yo female with ongoing Rt hip weakness, pain and gait dysfunction.  She has various factors that could be contributing including degenerative hip with labral tear, extruded disc with Rt L3 nerve impingement (2018 MRI), and a recent history of a partial tear of Rt quad for which she had PT and has healed.  She reports weakness vs pain at evaluation today, although did report onset of Rt anterior hip pain with repeated strength testing.  She has fatiguable weakness of Rt hip flexor and Rt quad which carries over into gait.  She can initiate gait with step through pattern with heel strike on Rt, but within 30 feet she cannot fully advance Rt LE and gait becomes step to with foot shuffle, losing heel strike.  Hip special tests were negative.  Pt is unable to perform an A/SLR on Rt secondary to weakness.  PT initiated a strength circuit to perform at high frequency/low reps with instructions to work to fatigue each round, 3 times daily.  PT also encouraged Pt to unweight spine in supine 2x/daily x 10 min to see if Rt LE fatigue improves with this.  Pt will benefit from ongoing assessment of response to PT to see if strength, function and gait improve.  She sees MD in 1 month to see if PT is helping, and if not, likely will have another MRI and possible Rt lumbar injections.    Personal Factors and Comorbidities Age;Time since onset of injury/illness/exacerbation    Examination-Activity Limitations Locomotion Level;Transfers     Examination-Participation Restrictions Community Activity;Shop;Cleaning    Stability/Clinical Decision Making Stable/Uncomplicated    Clinical Decision Making Low    Rehab Potential Good    PT Frequency 2x / week    PT Duration 12 weeks    PT Treatment/Interventions ADLs/Self Care Home Management;Electrical Stimulation;Neuromuscular re-education;Therapeutic exercise;Functional mobility training;Stair training;Gait training;Therapeutic activities;Manual techniques;Patient/family education;Dry needling;Passive range of motion;Joint Manipulations;Spinal Manipulations    PT Next Visit Plan review HEP, try supine hip flexion isometric on Rt, try SAQ to SLR from elevated pad with PT assist as needed or SL hip flexion if unable, gait training, Rt hip flexor and quad strength as tol, monitor fatiguable weakness of Rt hip flexors/quads    PT Home Exercise Plan Access Code: [email or web address]    Recommended Other Services MD has suggested possible new lumbar MRI (last one 2018) and lumbar injections if PT doesn't help    Consulted and Agree with Plan of Care Patient           Patient will benefit from skilled therapeutic intervention in order to improve the following deficits and impairments:  Abnormal gait, Decreased strength, Decreased endurance, Impaired flexibility, Postural dysfunction, Pain, Difficulty walking  Visit Diagnosis: Muscle weakness (generalized) - Plan: PT plan of care cert/re-cert  Other abnormalities of gait and mobility - Plan: PT plan of care cert/re-cert  Pain in right hip - Plan: PT plan of care cert/re-cert     Problem List Patient Active Problem List   Diagnosis Date Noted  . Arthritis of right hip 09/17/2019  . Labral tear of right hip joint 08/06/2019  . Gluteal tendonitis of right buttock 05/22/2019  . Tear of left gluteus medius tendon 01/04/2019  . Arthritis of left hip 03/20/2018  . Greater trochanteric bursitis of right hip 06/13/2017  . Injury  of spinal nerve root  at L3 level 11/29/2016  . Breast cancer of upper-outer quadrant of left female breast ([place]) 12/07/2014  . Acute recurrent sinusitis 01/17/2013    Baruch Merl, PT 01/28/20 2:15 PM    [facility] [address], [place], [place], [address] Phone: [phone]   Fax:  [phone]  Name: Christina [name] MRN: [id number] Date of Birth: 1950-07-13

## 2020-01-28 NOTE — Patient Instructions (Signed)
Access Code: [email or web address] URL: [email or web address] Date: 01/28/2020 Prepared by: Venetia Night Lafawn Lenoir  Exercises Seated March with Resistance - 3 x daily - 7 x weekly - 2 sets - 20 reps Seated Long Arc Quad - 3 x daily - 7 x weekly - 2 sets - 5 reps Seated Toe Raise - 3 x daily - 7 x weekly - 2 sets - 20 reps Sit to Stand - 3 x daily - 7 x weekly - 2 sets - 5 reps

## 2020-02-03 ENCOUNTER — Other Ambulatory Visit: Payer: Self-pay

## 2020-02-03 ENCOUNTER — Ambulatory Visit: Payer: PPO | Attending: Family Medicine | Admitting: Physical Therapy

## 2020-02-03 ENCOUNTER — Encounter: Payer: Self-pay | Admitting: Physical Therapy

## 2020-02-03 DIAGNOSIS — M25551 Pain in right hip: Secondary | ICD-10-CM | POA: Insufficient documentation

## 2020-02-03 DIAGNOSIS — M6281 Muscle weakness (generalized): Secondary | ICD-10-CM | POA: Diagnosis not present

## 2020-02-03 DIAGNOSIS — M62838 Other muscle spasm: Secondary | ICD-10-CM | POA: Insufficient documentation

## 2020-02-03 DIAGNOSIS — R2689 Other abnormalities of gait and mobility: Secondary | ICD-10-CM | POA: Insufficient documentation

## 2020-02-03 NOTE — Therapy (Signed)
[facility] [address] [place], [place], [address] Phone: [phone]   Fax:  [phone]  Physical Therapy Treatment  Patient Details  Name: Christina [name] MRN: [id number] Date of Birth: Aug 19, 1950 Referring Provider (PT): Gregor Hams, MD   Encounter Date: 02/03/2020   PT End of Session - 02/03/20 0848    Visit Number 2    Date for PT Re-Evaluation 04/21/20    Authorization Type [employer]    PT Start Time 0848    PT Stop Time 0931    PT Time Calculation (min) 43 min    Activity Tolerance Patient tolerated treatment well    Behavior During Therapy [facility] for tasks assessed/performed           Past Medical History:  Diagnosis Date  . Abrasion of right leg 12/17/2014  . Adrenal insufficiency ([place])   . Breast cancer of upper-outer quadrant of left female breast ([place]) 12/07/2014  . Cataract, immature    bilateral  . Dental bridge present    upper - x 2  . Dental crowns present   . Hypothyroidism   . Personal history of radiation therapy   . Prediabetes     Past Surgical History:  Procedure Laterality Date  . ABDOMINOPLASTY    . BALLOON SINUPLASTY    . BREAST LUMPECTOMY Left 2016  . BREAST LUMPECTOMY WITH RADIOACTIVE SEED AND SENTINEL LYMPH NODE BIOPSY Left 12/22/2014   Procedure: BREAST LUMPECTOMY WITH RADIOACTIVE SEED AND SENTINEL LYMPH NODE BIOPSY;  Surgeon: Stark Klein, MD;  Location: [facility];  Service: General;  Laterality: Left;  . BUNIONECTOMY Right   . HAMMER TOE SURGERY Bilateral   . RHINOPLASTY    . SEPTOPLASTY      There were no vitals filed for this visit.   Subjective Assessment - 02/03/20 0848    Subjective Patient did exercises to fatigue (which was very quickly) but then couldn't do anything after for four days.    How long can you walk comfortably? cannot walk comfortably    Diagnostic tests MRI Rt hip 08/19/19: mod degen, spurring, superior labrum  degen/torn, MRI lumbar 2018 multi-level disc dessication and lateral stenosis, right foraminal disc extrusion L3/4 with right L3 nerve root impingement    Patient Stated Goals be able to walk    Currently in Pain? No/denies                             [facility] Adult PT Treatment/Exercise - 02/03/20 0001      Exercises   Exercises Knee/Hip      Knee/Hip Exercises: Stretches   Active Hamstring Stretch Right;20 seconds    Active Hamstring Stretch Limitations standing    Piriformis Stretch Right;20 seconds    Piriformis Stretch Limitations supine knee to opp shoulder    Gastroc Stretch Both;2 reps;30 seconds   left only one rep     Knee/Hip Exercises: Standing   Heel Raises Both;10 reps    Heel Raises Limitations toe raises x 10    Hip Abduction Both;10 reps    Hip Extension Both;10 reps      Knee/Hip Exercises: Seated   Long Arc Quad Right;15 reps    Knee/Hip Flexion seated Hip flexion with straight leg x 10 Rt      Knee/Hip Exercises: Supine   Quad Sets Both;5 reps    Quad Sets Limitations 5 sec    Short Arc [employer]  Sets Right;5 reps    Straight Leg Raises Limitations only could raise 2 inches x 2; difficult with PT assist as well'      Knee/Hip Exercises: Sidelying   Hip ABduction Right;2 sets;5 sets    Clams Rt 2x5    Other Sidelying Knee/Hip Exercises hip flexion Rt with straight leg 2x5                  PT Education - 02/03/20 1820    Education Details HEP progressed    Person(s) Educated Patient    Methods Explanation;Demonstration;Handout    Comprehension Verbalized understanding;Returned demonstration            PT Short Term Goals - 01/28/20 1403      PT SHORT TERM GOAL #1   Title Pt will be ind and consistent with high frequency low rep initial HEP to strengthen Rt hip and thigh with respect of Rt LE fatigue with ther ex.    Time 3    Period Weeks    Status New    Target Date 02/18/20      PT SHORT TERM GOAL #2   Title Pt will be  compliant with unweighting spine 2x/day to see if this allows her greater functional strength of Rt LE throughout the day.    Baseline feels good in AM but signif fatigue in Rt hip flexor/quad as day progresses    Time 3    Period Weeks    Status New    Target Date 01/07/20      PT SHORT TERM GOAL #3   Title Pt will be able to perform step through gait with consistent heel strike on Rt LE x 80 feet    Time 4    Period Weeks    Status New    Target Date 02/25/20             PT Long Term Goals - 01/28/20 1406      PT LONG TERM GOAL #1   Title Pt with achieve Rt hip flexor and quad strength of at least 4+/5 for improved daily task endurance and ambulation.    Baseline -    Time 12    Period Weeks    Status New    Target Date 04/21/20      PT LONG TERM GOAL #2   Title Pt will be able to participate in 6 min walk test with step through gait pattern and consistent heel strike on Rt LE due to improved Rt LE hip advancement.    Baseline step through becomes step to with foot shuffle secondary to Rt hip flexor fatigue within 20-30 feet    Time 12    Period Weeks    Status New    Target Date 04/21/20      PT LONG TERM GOAL #3   Title Pt will report at least 70% improvement in Rt LE fatigue so that she may be more active throughout the day.    Baseline -    Time 12    Period Weeks    Status New    Target Date 04/21/20      PT LONG TERM GOAL #4   Title Pt will be able to demo safety and stability with curbs and stairs for community ambulation and public access for outings.    Baseline -    Time 12    Period Weeks    Status New    Target Date 04/21/20  PT LONG TERM GOAL #5   Title Pt will be ind with advanced HEP    Baseline -    Time 12    Period Weeks    Status New    Target Date 04/21/20                 Plan - 02/03/20 1313    Clinical Impression Statement Patient reporting that doing her exercises to fatigue caused her to not be able to do anything  for 4 days. We discussed how to pace her exercises while at the same time pushing herself. She did well with TE today with small rests as needed and some intermittent stretching. Gastroc stretch issued for tight right ankle as well as standing hip ABD and ext.    Personal Factors and Comorbidities Age;Time since onset of injury/illness/exacerbation    Examination-Activity Limitations Locomotion Level;Transfers    Examination-Participation Restrictions Community Activity;Shop;Cleaning    PT Frequency 2x / week    PT Duration 12 weeks    PT Treatment/Interventions ADLs/Self Care Home Management;Electrical Stimulation;Neuromuscular re-education;Therapeutic exercise;Functional mobility training;Stair training;Gait training;Therapeutic activities;Manual techniques;Patient/family education;Dry needling;Passive range of motion;Joint Manipulations;Spinal Manipulations    PT Next Visit Plan try supine hip flexion isometric on Rt, try SAQ to SLR from elevated pad with PT assist as needed or SL hip flexion if unable, gait training, Rt hip flexor and quad strength as tol, monitor fatiguable weakness of Rt hip flexors/quads    PT Home Exercise Plan Access Code: [email or web address]    Consulted and Agree with Plan of Care Patient           Patient will benefit from skilled therapeutic intervention in order to improve the following deficits and impairments:  Abnormal gait, Decreased strength, Decreased endurance, Impaired flexibility, Postural dysfunction, Pain, Difficulty walking  Visit Diagnosis: Muscle weakness (generalized)  Other abnormalities of gait and mobility  Pain in right hip     Problem List Patient Active Problem List   Diagnosis Date Noted  . Arthritis of right hip 09/17/2019  . Labral tear of right hip joint 08/06/2019  . Gluteal tendonitis of right buttock 05/22/2019  . Tear of left gluteus medius tendon 01/04/2019  . Arthritis of left hip 03/20/2018  . Greater trochanteric bursitis of  right hip 06/13/2017  . 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 Breast cancer of upper-outer quadrant of left female breast ([place]) 12/07/2014  . Acute recurrent sinusitis 01/17/2013    Madelyn Flavors PT 02/03/2020, 6:23 PM  [facility] [address] [place], [place], [address] Phone: [phone]   Fax:  [phone]  Name: Christina [name] MRN: [id number] Date of Birth: April 10, 1951

## 2020-02-03 NOTE — Patient Instructions (Signed)
Access Code: [email or web address] URL: [email or web address] Date: 02/03/2020 Prepared by: Almyra Free  Exercises Seated March with Resistance - 3 x daily - 7 x weekly - 2 sets - 20 reps Seated Long Arc Quad - 3 x daily - 7 x weekly - 2 sets - 5 reps Seated Toe Raise - 3 x daily - 7 x weekly - 2 sets - 20 reps Sit to Stand - 3 x daily - 7 x weekly - 2 sets - 5 reps Standing Gastroc Stretch - 2 x daily - 7 x weekly - 1 sets - 2 reps - 30 hold Standing Hip Extension with Counter Support - 1 x daily - 3-4 x weekly - 2-3 sets - 10 reps Standing Hip Abduction with Counter Support - 1 x daily - 3-4 x weekly - 2-3 sets - 10 reps

## 2020-02-05 ENCOUNTER — Other Ambulatory Visit: Payer: Self-pay

## 2020-02-05 ENCOUNTER — Ambulatory Visit: Payer: PPO

## 2020-02-05 DIAGNOSIS — M6281 Muscle weakness (generalized): Secondary | ICD-10-CM | POA: Diagnosis not present

## 2020-02-05 DIAGNOSIS — R2689 Other abnormalities of gait and mobility: Secondary | ICD-10-CM

## 2020-02-05 DIAGNOSIS — M25551 Pain in right hip: Secondary | ICD-10-CM

## 2020-02-05 NOTE — Therapy (Signed)
[facility] [address] [place], [place], [address] Phone: [phone]   Fax:  [phone]  Physical Therapy Treatment  Patient Details  Name: Christina [name] MRN: [id number] Date of Birth: June 15, 1950 Referring Provider (PT): Gregor Hams, MD   Encounter Date: 02/05/2020   PT End of Session - 02/05/20 1051    Visit Number 3    Date for PT Re-Evaluation 04/21/20    Authorization Type [employer]    Progress Note Due on Visit 10    PT Start Time 1018    PT Stop Time 1051   fatigue   PT Time Calculation (min) 33 min    Activity Tolerance Patient limited by fatigue    Behavior During Therapy [facility] for tasks assessed/performed           Past Medical History:  Diagnosis Date  . Abrasion of right leg 12/17/2014  . Adrenal insufficiency ([name])   . Breast cancer of upper-outer quadrant of left female breast ([place]) 12/07/2014  . Cataract, immature    bilateral  . Dental bridge present    upper - x 2  . Dental crowns present   . Hypothyroidism   . Personal history of radiation therapy   . Prediabetes     Past Surgical History:  Procedure Laterality Date  . ABDOMINOPLASTY    . BALLOON SINUPLASTY    . BREAST LUMPECTOMY Left 2016  . BREAST LUMPECTOMY WITH RADIOACTIVE SEED AND SENTINEL LYMPH NODE BIOPSY Left 12/22/2014   Procedure: BREAST LUMPECTOMY WITH RADIOACTIVE SEED AND SENTINEL LYMPH NODE BIOPSY;  Surgeon: Stark Klein, MD;  Location: [place];  Service: General;  Laterality: Left;  . BUNIONECTOMY Right   . HAMMER TOE SURGERY Bilateral   . RHINOPLASTY    . SEPTOPLASTY      There were no vitals filed for this visit.   Subjective Assessment - 02/05/20 1023    Subjective Last session was better.  I was not as fatigued but didn't do any exercises yeseterday.    Diagnostic tests MRI Rt hip 08/19/19: mod degen, spurring, superior labrum degen/torn, MRI lumbar 2018 multi-level disc  dessication and lateral stenosis, right foraminal disc extrusion L3/4 with right L3 nerve root impingement    Currently in Pain? No/denies                             [facility] Adult PT Treatment/Exercise - 02/05/20 0001      Ambulation/Gait   Gait Comments next to treadmill: heel strike and step through with emphasis on co-contraction of hips and knees in stance      Knee/Hip Exercises: Stretches   Active Hamstring Stretch Right;20 seconds;2 reps    Active Hamstring Stretch Limitations seated       Knee/Hip Exercises: Aerobic   Nustep Level 3 x 8 minutes      Knee/Hip Exercises: Standing   Heel Raises Limitations toe raises x 10      Knee/Hip Exercises: Seated   Long Arc Quad Strengthening;Both;2 sets;10 reps    [employer] 5" hold x 20-challenging     Marching Strengthening;Both;2 sets;10 reps    Abduction/Adduction  Strengthening;2 sets;10 reps    Abd/Adduction Limitations green loop      Knee/Hip Exercises: Supine   Short Arc [employer] reps                    PT Short Term  Goals - 02/05/20 1027      PT SHORT TERM GOAL #1   Title Pt will be ind and consistent with high frequency low rep initial HEP to strengthen Rt hip and thigh with respect of Rt LE fatigue with ther ex.    Period Weeks    Status On-going             PT Long Term Goals - 01/28/20 1406      PT LONG TERM GOAL #1   Title Pt with achieve Rt hip flexor and quad strength of at least 4+/5 for improved daily task endurance and ambulation.    Baseline -    Time 12    Period Weeks    Status New    Target Date 04/21/20      PT LONG TERM GOAL #2   Title Pt will be able to participate in 6 min walk test with step through gait pattern and consistent heel strike on Rt LE due to improved Rt LE hip advancement.    Baseline step through becomes step to with foot shuffle secondary to Rt hip flexor fatigue within 20-30 feet    Time 12    Period Weeks    Status New     Target Date 04/21/20      PT LONG TERM GOAL #3   Title Pt will report at least 70% improvement in Rt LE fatigue so that she may be more active throughout the day.    Baseline -    Time 12    Period Weeks    Status New    Target Date 04/21/20      PT LONG TERM GOAL #4   Title Pt will be able to demo safety and stability with curbs and stairs for community ambulation and public access for outings.    Baseline -    Time 12    Period Weeks    Status New    Target Date 04/21/20      PT LONG TERM GOAL #5   Title Pt will be ind with advanced HEP    Baseline -    Time 12    Period Weeks    Status New    Target Date 04/21/20                 Plan - 02/05/20 1034    Clinical Impression Statement Pt did better after last session due to reduced reps. We discussed how to pace her exercises while at the same time pushing herself. Pt required tactile and verbal cues for hip alignment with seated marching exercise. She did well with TE today with small rests as needed and some intermittent stretching. Pt continues to drag her Rt LE with swing through phase of gait.  Pt worked on this at the edge of treadmill to improve consistency with heel strike.  Pt will continue to benefit from skilled PT to address Rt LE weakness and gait abnormality.    PT Frequency 2x / week    PT Duration 12 weeks    PT Treatment/Interventions ADLs/Self Care Home Management;Electrical Stimulation;Neuromuscular re-education;Therapeutic exercise;Functional mobility training;Stair training;Gait training;Therapeutic activities;Manual techniques;Patient/family education;Dry needling;Passive range of motion;Joint Manipulations;Spinal Manipulations    PT Next Visit Plan gentle LE strength, work on gait to improve heel strike.    PT Home Exercise Plan Access Code: [email or web address]    Consulted and Agree with Plan of Care Patient           Patient will benefit  from skilled therapeutic intervention in order to improve the  following deficits and impairments:  Abnormal gait, Decreased strength, Decreased endurance, Impaired flexibility, Postural dysfunction, Pain, Difficulty walking  Visit Diagnosis: Other abnormalities of gait and mobility  Muscle weakness (generalized)  Pain in right hip     Problem List Patient Active Problem List   Diagnosis Date Noted  . Arthritis of right hip 09/17/2019  . Labral tear of right hip joint 08/06/2019  . Gluteal tendonitis of right buttock 05/22/2019  . Tear of left gluteus medius tendon 01/04/2019  . Arthritis of left hip 03/20/2018  . Greater trochanteric bursitis of right hip 06/13/2017  . 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 Breast cancer of upper-outer quadrant of left female breast ([place]) 12/07/2014  . Acute recurrent sinusitis 01/17/2013     Sigurd Sos, PT 02/05/20 10:55 AM  [facility] [address], [place], [place], [address] Phone: [phone]   Fax:  [phone]  Name: KHADEEJA ELDEN MRN: [id number] Date of Birth: 15-Jan-1951

## 2020-02-09 ENCOUNTER — Ambulatory Visit: Payer: PPO

## 2020-02-09 ENCOUNTER — Other Ambulatory Visit: Payer: Self-pay

## 2020-02-09 DIAGNOSIS — M6281 Muscle weakness (generalized): Secondary | ICD-10-CM

## 2020-02-09 DIAGNOSIS — M25551 Pain in right hip: Secondary | ICD-10-CM

## 2020-02-09 DIAGNOSIS — R2689 Other abnormalities of gait and mobility: Secondary | ICD-10-CM

## 2020-02-09 NOTE — Patient Instructions (Signed)
Access Code: [email or web address] URL: [email or web address] Date: 02/09/2020 Prepared by: Claiborne Billings   Long Sitting Ankle Dorsiflexion with Anchored Resistance - 2 x daily - 7 x weekly - 2 sets - 10 reps Ankle Inversion with Resistance - 2 x daily - 7 x weekly - 2 sets - 10 reps
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
## 2020-02-18 ENCOUNTER — Other Ambulatory Visit: Payer: Self-pay

## 2020-02-18 ENCOUNTER — Encounter: Payer: Self-pay | Admitting: Physical Therapy

## 2020-02-18 ENCOUNTER — Ambulatory Visit: Payer: PPO | Admitting: Physical Therapy

## 2020-02-18 ENCOUNTER — Encounter: Payer: Self-pay | Admitting: Family

## 2020-02-18 DIAGNOSIS — M62838 Other muscle spasm: Secondary | ICD-10-CM

## 2020-02-18 DIAGNOSIS — M6281 Muscle weakness (generalized): Secondary | ICD-10-CM

## 2020-02-18 DIAGNOSIS — M25551 Pain in right hip: Secondary | ICD-10-CM

## 2020-02-18 DIAGNOSIS — R2689 Other abnormalities of gait and mobility: Secondary | ICD-10-CM

## 2020-02-18 NOTE — Therapy (Signed)
[facility] [address] [place], [place], [address] Phone: [phone]   Fax:  [phone]  Physical Therapy Treatment  Patient Details  Name: Christina [name] MRN: [id number] Date of Birth: 1950-08-22 Referring Provider (PT): Gregor Hams, MD   Encounter Date: 02/18/2020   PT End of Session - 02/18/20 1012    Visit Number 5    Date for PT Re-Evaluation 04/21/20    Authorization Type [employer]    Progress Note Due on Visit 10    PT Start Time 1015    PT Stop Time 1100    PT Time Calculation (min) 45 min    Activity Tolerance No increased pain    Behavior During Therapy [facility] for tasks assessed/performed           Past Medical History:  Diagnosis Date  . Abrasion of right leg 12/17/2014  . Adrenal insufficiency ([place])   . Breast cancer of upper-outer quadrant of left female breast ([place]) 12/07/2014  . Cataract, immature    bilateral  . Dental bridge present    upper - x 2  . Dental crowns present   . Hypothyroidism   . Personal history of radiation therapy   . Prediabetes     Past Surgical History:  Procedure Laterality Date  . ABDOMINOPLASTY    . BALLOON SINUPLASTY    . BREAST LUMPECTOMY Left 2016  . BREAST LUMPECTOMY WITH RADIOACTIVE SEED AND SENTINEL LYMPH NODE BIOPSY Left 12/22/2014   Procedure: BREAST LUMPECTOMY WITH RADIOACTIVE SEED AND SENTINEL LYMPH NODE BIOPSY;  Surgeon: Stark Klein, MD;  Location: [place];  Service: General;  Laterality: Left;  . BUNIONECTOMY Right   . HAMMER TOE SURGERY Bilateral   . RHINOPLASTY    . SEPTOPLASTY      There were no vitals filed for this visit.   Subjective Assessment - 02/18/20 1017    Subjective I don't think I'm getting any better.  I bought the AFO and don't know how to use it.  When I walk I just start hurting everywhere and I love to walk so this really maddens me.  I feel like the Rt leg is numb and weak.    How long can  you walk comfortably? cannot walk comfortably    Diagnostic tests MRI Rt hip 08/19/19: mod degen, spurring, superior labrum degen/torn, MRI lumbar 2018 multi-level disc dessication and lateral stenosis, right foraminal disc extrusion L3/4 with right L3 nerve root impingement    Currently in Pain? Yes    Pain Score 8     Pain Location Leg    Pain Orientation Right    Pain Type Chronic pain;Acute pain    Pain Onset More than a month ago    Pain Frequency Intermittent    Aggravating Factors  walking - can't tell if it is pain or weakness                             [facility] Adult PT Treatment/Exercise - 02/18/20 0001      Ambulation/Gait   Ambulation/Gait Yes    Gait Comments PT cued focused hip advancement and heel strike 2x40 steps      Self-Care   Self-Care Other Self-Care Comments    Other Self-Care Comments  Rt OTC AFO donning and use      Exercises   Exercises Lumbar;Knee/Hip;Ankle      Lumbar Exercises: Aerobic   Nustep  L1 x 4', Pt noted signif fatigue and numbness in Rt LE with this, tried LEs only at first but at 1:30 needed UE assist      Knee/Hip Exercises: Supine   Short Arc Quad Sets Strengthening;2 sets;5 reps    Straight Leg Raises AAROM;5 reps;2 sets    Straight Leg Raises Limitations small range from SAQ bolster with PT assist and tapping at proximal quad    Other Supine Knee/Hip Exercises Rt hip isometrics 4-way in 90/90 Rt LE position, PT provided manual resistance 3x5 sec holds each way    Other Supine Knee/Hip Exercises hip flexion march from feet on bolster 5x each bil      Manual Therapy   Manual Therapy Manual Traction;Soft tissue mobilization    Soft tissue mobilization spikey roller massage to each hip muscle group prior to ther ex for improved blood flow and awareness given numbness/disconnect report by Pt    Manual Traction Rt long axis traction 3x20 sec bouts supine Gr II/III                    PT Short Term Goals - 02/05/20  1027      PT SHORT TERM GOAL #1   Title Pt will be ind and consistent with high frequency low rep initial HEP to strengthen Rt hip and thigh with respect of Rt LE fatigue with ther ex.    Period Weeks    Status On-going             PT Long Term Goals - 01/28/20 1406      PT LONG TERM GOAL #1   Title Pt with achieve Rt hip flexor and quad strength of at least 4+/5 for improved daily task endurance and ambulation.    Baseline -    Time 12    Period Weeks    Status New    Target Date 04/21/20      PT LONG TERM GOAL #2   Title Pt will be able to participate in 6 min walk test with step through gait pattern and consistent heel strike on Rt LE due to improved Rt LE hip advancement.    Baseline step through becomes step to with foot shuffle secondary to Rt hip flexor fatigue within 20-30 feet    Time 12    Period Weeks    Status New    Target Date 04/21/20      PT LONG TERM GOAL #3   Title Pt will report at least 70% improvement in Rt LE fatigue so that she may be more active throughout the day.    Baseline -    Time 12    Period Weeks    Status New    Target Date 04/21/20      PT LONG TERM GOAL #4   Title Pt will be able to demo safety and stability with curbs and stairs for community ambulation and public access for outings.    Baseline -    Time 12    Period Weeks    Status New    Target Date 04/21/20      PT LONG TERM GOAL #5   Title Pt will be ind with advanced HEP    Baseline -    Time 12    Period Weeks    Status New    Target Date 04/21/20                 Plan - 02/18/20 1145  Clinical Impression Statement Pt purchased OTC AFO for Rt ankle which PT helped don and understand use/purpose with gait training today.  She did have improved heel strike with PT cueing more focus on Rt hip flexion to advance the Rt LE.  PT tried manual compression at Rt hip during supine ther ex and used spikey roller massage to muscle groups before ther ex, both of which  helped Pt feel more awareness of Rt hip muscles.  She felt improved pain with 90/90 Rt LE positioning and manual resitance for 4-way hip isometrics today.  She is frustrated by weakness, numbness and pain surrounding Rt LE which limits walking which she used to love to do for exercise.  Pt sees MD next week.  She continues to have weakness preventing supine SLR without PT assist and fatigues quickly with hip flexors and adductors.  Pt will need ongoing assessment regarding response to treatment interventions.    PT Next Visit Plan f/u on AFO use, continue Rt LE strength with manual compression/spikey roller to muscle groups at proximal hip, gait for hip advancement and heel strike    PT Home Exercise Plan Access Code: [email or web address]    Consulted and Agree with Plan of Care Patient           Patient will benefit from skilled therapeutic intervention in order to improve the following deficits and impairments:     Visit Diagnosis: Muscle weakness (generalized)  Other abnormalities of gait and mobility  Pain in right hip  Other muscle spasm     Problem List Patient Active Problem List   Diagnosis Date Noted  . Arthritis of right hip 09/17/2019  . Labral tear of right hip joint 08/06/2019  . Gluteal tendonitis of right buttock 05/22/2019  . Tear of left gluteus medius tendon 01/04/2019  . Arthritis of left hip 03/20/2018  . Greater trochanteric bursitis of right hip 06/13/2017  . 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 Breast cancer of upper-outer quadrant of left female breast ([place]) 12/07/2014  . Acute recurrent sinusitis 01/17/2013    Baruch Merl, PT 02/18/20 11:50 AM   [facility] [address], [place], [place], [address] Phone: [phone]   Fax:  [phone]  Name: Christina [name] MRN: [id number] Date of Birth: 1950/12/19

## 2020-02-25 ENCOUNTER — Other Ambulatory Visit: Payer: Self-pay

## 2020-02-25 ENCOUNTER — Encounter: Payer: Self-pay | Admitting: Family Medicine

## 2020-02-25 ENCOUNTER — Ambulatory Visit (INDEPENDENT_AMBULATORY_CARE_PROVIDER_SITE_OTHER): Payer: PPO

## 2020-02-25 ENCOUNTER — Ambulatory Visit: Payer: PPO | Admitting: Family Medicine

## 2020-02-25 VITALS — BP 130/76 | Ht 63.0 in | Wt 113.8 lb

## 2020-02-25 DIAGNOSIS — R29898 Other symptoms and signs involving the musculoskeletal system: Secondary | ICD-10-CM

## 2020-02-25 DIAGNOSIS — M79651 Pain in right thigh: Secondary | ICD-10-CM | POA: Diagnosis not present

## 2020-02-25 DIAGNOSIS — S3421XD Injury of nerve root of lumbar spine, subsequent encounter: Secondary | ICD-10-CM

## 2020-02-25 DIAGNOSIS — M47816 Spondylosis without myelopathy or radiculopathy, lumbar region: Secondary | ICD-10-CM | POA: Diagnosis not present

## 2020-02-25 NOTE — Progress Notes (Signed)
   I, Wendy Poet, LAT, ATC, am serving as scribe for Dr. Lynne Leader.  Christina [name] is a 69 y.o. female who presents to [place] at [facility] today for f/u of R thigh pain and R LE weakness.  She was last seen by Dr. Georgina Snell on 01/27/20 and was referred to PT of which she has completed 5 visits.  She noted a con't nodule at her R medial quad/thigh and reported R hip/thigh/leg weakness.  Since her last visit, pt reports that her R leg weakness is worse and she feels like she's "lost control of her R leg and foot" and is afraid of feeling.  Diagnostic imaging: R hip MRI- 08/03/19; R hip XR- 07/02/19   Pertinent review of systems: No fevers or chills  Relevant historical information: History right L3 radiculopathy due to bulging disc identified MRI in 2018.  Had several epidural steroid injections last about a year ago with moderate benefit.   Exam:  BP 130/76 (BP Location: Right Arm, Patient Position: Sitting, Cuff Size: Normal)   Ht 5\' 3"  (1.6 m)   Wt 113 lb 12.8 oz (51.6 kg)   BMI 20.16 kg/m  General: Well Developed, well nourished, and in no acute distress.   MSK: L-spine normal-appearing nontender midline. Strength. Hip flexion: Right 3/5 left 5/5.   Hip abduction 5/5 bilaterally. Hip adduction 5/5 bilaterally. Knee extension: Right 3/5 left 5/5. Knee flexion: Right 5/5 left 5/5. Foot dorsiflexion: Right 4/5 left 4/5. Great toe dorsiflexion right 4/5 left 4/5. Foot plantarflexion right 5/5 left 5/5  Reflexes diminished bilaterally. Sensation intact throughout.   Lab and Radiology Results  X-ray images L-spine obtained today personally and independently interpreted DDD L2-3 moderate.  Calcification cartilaginous structures false ribs.  Fractures. Await formal radiology review    Assessment and Plan: 69 y.o. female with right leg weakness was profound and hip flexion and knee extension.  This corresponds to L3 dermatomal pattern where she has had  problems before.  She does have some symptoms extending beyond L3 nerve root.  Plan for x-ray and MRI and we will go ahead and order epidural steroid injection but plan to have that done after MRI.  Will change level if needed based on MRI.  Recheck after epidural steroid injection. Considerable weakness concern for fall and injury.  PDMP not reviewed this encounter. Orders Placed This Encounter  Procedures  . DG Lumbar Spine 2-3 Views    Standing Status:   Future    Standing Expiration Date:   02/24/2021    Order Specific Question:   Reason for Exam (SYMPTOM  OR DIAGNOSIS REQUIRED)    Answer:   eval right leg weakness    Order Specific Question:   Preferred imaging location?    Answer:   [facility]  . DG Epidural/Nerve Root    Standing Status:   Future    Standing Expiration Date:   02/24/2021    Order Specific Question:   Reason for Exam (SYMPTOM  OR DIAGNOSIS REQUIRED)    Answer:   Right L3 for weakness. Updated MRI Pending    Order Specific Question:   Preferred imaging location?    Answer:   [id number]   No orders of the defined types were placed in this encounter.    Discussed warning signs or symptoms. Please see discharge instructions. Patient expresses understanding.   The above documentation has been reviewed and is accurate and complete Lynne Leader, M.D.

## 2020-02-25 NOTE — Patient Instructions (Addendum)
Thank you for coming in today.  Plan for MRI lumbar spine and likely epidural steroid injection at L3 on the right.  Do not take ibuprofen or aleve the day before the shot.  Avoid aspirin 1 week prior to the shot.   Please get an Xray today before you leave  You should hear from MRI scheduling within 1 week. If you do not hear please let me know.   Please call [facility] at [phone] to schedule your spine injection.  Call to schedule the injection for a few days after when the MRI is scheduled for.

## 2020-02-27 ENCOUNTER — Ambulatory Visit: Payer: PPO | Admitting: Family

## 2020-03-01 ENCOUNTER — Ambulatory Visit (INDEPENDENT_AMBULATORY_CARE_PROVIDER_SITE_OTHER): Payer: PPO | Admitting: Family

## 2020-03-01 ENCOUNTER — Encounter: Payer: Self-pay | Admitting: Family

## 2020-03-01 ENCOUNTER — Telehealth: Payer: Self-pay | Admitting: Family Medicine

## 2020-03-01 ENCOUNTER — Other Ambulatory Visit: Payer: Self-pay

## 2020-03-01 VITALS — BP 144/72 | HR 94 | Temp 98.7°F | Ht 63.0 in | Wt 115.6 lb

## 2020-03-01 DIAGNOSIS — R7303 Prediabetes: Secondary | ICD-10-CM

## 2020-03-01 DIAGNOSIS — M79651 Pain in right thigh: Secondary | ICD-10-CM

## 2020-03-01 DIAGNOSIS — E039 Hypothyroidism, unspecified: Secondary | ICD-10-CM

## 2020-03-01 DIAGNOSIS — E038 Other specified hypothyroidism: Secondary | ICD-10-CM

## 2020-03-01 DIAGNOSIS — E2740 Unspecified adrenocortical insufficiency: Secondary | ICD-10-CM

## 2020-03-01 DIAGNOSIS — Z13220 Encounter for screening for lipoid disorders: Secondary | ICD-10-CM

## 2020-03-01 DIAGNOSIS — S3421XD Injury of nerve root of lumbar spine, subsequent encounter: Secondary | ICD-10-CM

## 2020-03-01 DIAGNOSIS — R29898 Other symptoms and signs involving the musculoskeletal system: Secondary | ICD-10-CM

## 2020-03-01 LAB — COMPREHENSIVE METABOLIC PANEL
ALT: 17 U/L (ref 0–35)
AST: 20 U/L (ref 0–37)
Albumin: 4.4 g/dL (ref 3.5–5.2)
Alkaline Phosphatase: 54 U/L (ref 39–117)
BUN: 10 mg/dL (ref 6–23)
CO2: 28 mEq/L (ref 19–32)
Calcium: 9.3 mg/dL (ref 8.4–10.5)
Chloride: 102 mEq/L (ref 96–112)
Creatinine, Ser: 0.63 mg/dL (ref 0.40–1.20)
GFR: 90 mL/min (ref >60.00)
Glucose, Bld: 98 mg/dL (ref 70–99)
Potassium: 4.5 mEq/L (ref 3.5–5.1)
Sodium: 138 mEq/L (ref 135–145)
Total Bilirubin: 0.3 mg/dL (ref 0.2–1.2)
Total Protein: 6.5 g/dL (ref 6.0–8.3)

## 2020-03-01 LAB — CBC WITH DIFFERENTIAL/PLATELET
Basophils Absolute: 0 10*3/uL (ref 0.0–0.1)
Basophils Relative: 0.4 % (ref 0.0–3.0)
Eosinophils Absolute: 0.1 10*3/uL (ref 0.0–0.7)
Eosinophils Relative: 0.8 % (ref 0.0–5.0)
HCT: 35.8 % — ABNORMAL LOW (ref 36.0–46.0)
Hemoglobin: 11.8 g/dL — ABNORMAL LOW (ref 12.0–15.0)
Lymphocytes Relative: 15.1 % (ref 12.0–46.0)
Lymphs Abs: 1.5 10*3/uL (ref 0.7–4.0)
MCHC: 33 g/dL (ref 30.0–36.0)
MCV: 95.1 fl (ref 78.0–100.0)
Monocytes Absolute: 0.6 10*3/uL (ref 0.1–1.0)
Monocytes Relative: 5.7 % (ref 3.0–12.0)
Neutro Abs: 7.9 10*3/uL — ABNORMAL HIGH (ref 1.4–7.7)
Neutrophils Relative %: 78 % — ABNORMAL HIGH (ref 43.0–77.0)
Platelets: 245 10*3/uL (ref 150.0–400.0)
RBC: 3.77 Mil/uL — ABNORMAL LOW (ref 3.87–5.11)
RDW: 13.5 % (ref 11.5–15.5)
WBC: 10.2 10*3/uL (ref 4.0–10.5)

## 2020-03-01 LAB — LIPID PANEL
Cholesterol: 201 mg/dL — ABNORMAL HIGH (ref 0–200)
HDL: 75.7 mg/dL (ref >39.00)
LDL Cholesterol: 91 mg/dL (ref 0–99)
NonHDL: 124.87
Total CHOL/HDL Ratio: 3
Triglycerides: 170 mg/dL — ABNORMAL HIGH (ref 0.0–149.0)
VLDL: 34 mg/dL (ref 0.0–40.0)

## 2020-03-01 LAB — HEMOGLOBIN A1C: Hgb A1c MFr Bld: 6.3 % (ref 4.6–6.5)

## 2020-03-01 MED ORDER — CLONAZEPAM 1 MG PO TABS: 1.0000 mg | ORAL_TABLET | Freq: Every day | ORAL | 2 refills | Status: DC

## 2020-03-01 MED ORDER — MONTELUKAST SODIUM 10 MG PO TABS: 10.0000 mg | ORAL_TABLET | Freq: Every day | ORAL | 3 refills | Status: AC

## 2020-03-01 MED ORDER — CYCLOBENZAPRINE HCL 10 MG PO TABS: 10.0000 mg | ORAL_TABLET | Freq: Three times a day (TID) | ORAL | 0 refills | Status: DC | PRN

## 2020-03-01 NOTE — Telephone Encounter (Signed)
Called pt and provided her w/ the phone number for [facility] so she can call and get herself scheduled.

## 2020-03-01 NOTE — Progress Notes (Signed)
Christina [name] is a 69 y.o. female with the following history as recorded in EpicCare:  Patient Active Problem List   Diagnosis Date Noted  . Arthritis of right hip 09/17/2019  . Labral tear of right hip joint 08/06/2019  . Gluteal tendonitis of right buttock 05/22/2019  . Tear of left gluteus medius tendon 01/04/2019  . Arthritis of left hip 03/20/2018  . Greater trochanteric bursitis of right hip 06/13/2017  . 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 Breast cancer of upper-outer quadrant of left female breast ([place]) 12/07/2014  . Acute recurrent sinusitis 01/17/2013    Current Outpatient Medications  Medication Sig Dispense Refill  . albuterol (PROVENTIL HFA;VENTOLIN HFA) 108 (90 BASE) MCG/ACT inhaler Inhale into the lungs every 6 (six) hours as needed for wheezing or shortness of breath.    Ilean Skill Lipoic Acid 200 MG CAPS Take 600 mg by mouth 2 (two) times daily.     . Ascorbic Acid (VITAMIN C) 1000 MG tablet Take 2,000-3,000 mg by mouth 2 (two) times daily. Takes 2065m in the morning and 30061mat night    . calcium carbonate (OS-CAL) 600 MG TABS tablet Take 800 mg by mouth 2 (two) times daily with a meal.    . cetirizine (ZYRTEC) 10 MG tablet Take 10 mg by mouth daily.     . clonazePAM (KLONOPIN) 1 MG tablet Take 1 tablet (1 mg total) by mouth at bedtime. 30 tablet 2  . Coenzyme Q10 200 MG capsule Take 200 mg by mouth daily.    . cyclobenzaprine (FLEXERIL) 10 MG tablet Take 1 tablet (10 mg total) by mouth 3 (three) times daily as needed for muscle spasms. 90 tablet 0  . cycloSPORINE (RESTASIS) 0.05 % ophthalmic emulsion Place 1 drop into both eyes 2 (two) times daily.    . Flaxseed, Linseed, (FLAXSEED OIL) 1000 MG CAPS Take 1,000 mg by mouth daily.     . fluticasone (FLONASE) 50 MCG/ACT nasal spray Place 1 spray into both nostrils daily.  11  . guaiFENesin (MUCINEX) 600 MG 12 hr tablet Take 600 mg by mouth daily.     . Marland Kitchenbuprofen (ADVIL,MOTRIN) 200 MG tablet Take 600  mg by mouth every 4 (four) hours as needed for fever, headache, mild pain, moderate pain or cramping.    . Marland Kitcheniothyronine (CYTOMEL) 25 MCG tablet Take 75 mcg by mouth daily. 2 in the am; 1 at noon    . MAGNESIUM LACTATE PO Take 2 tablets by mouth at bedtime.    . metFORMIN (GLUCOPHAGE-XR) 500 MG 24 hr tablet Take 1,000 mg by mouth 2 (two) times daily.   1  . methylPREDNISolone (MEDROL) 4 MG tablet Take 4 mg by mouth 3 (three) times daily. Takes 67m88mn the morning     . montelukast (SINGULAIR) 10 MG tablet Take 1 tablet (10 mg total) by mouth at bedtime. 90 tablet 3  . niacin 500 MG tablet Take 500 mg by mouth daily.     . ondansetron (ZOFRAN ODT) 4 MG disintegrating tablet Take 1 tablet (4 mg total) by mouth every 8 (eight) hours as needed for nausea or vomiting. 12 tablet 0  . OVER THE COUNTER MEDICATION Take 1-2 tablets by mouth 2 (two) times daily. *Nutrient 950 with NAC2* takes 2 tablets in the morning and 1 tablet at lunchtime    . OVER THE COUNTER MEDICATION Take 2 tablets by mouth daily with breakfast. *LV GB Complex*    . OVER THE COUNTER  MEDICATION Take 1 tablet by mouth 2 (two) times daily. *Regenemax or Biosil*    . OVER THE COUNTER MEDICATION Take 30 mg by mouth daily. *Borotab*    . STRONTIUM GLUCONATE-B6-B12-FA PO Take 2 tablets by mouth daily.     . triamcinolone cream (KENALOG) 0.1 % Apply 1 application topically 2 (two) times daily. 453.6 g 1  . valACYclovir (VALTREX) 1000 MG tablet TAKE 2 TABLETS EVERY MORNING AND TAKE 2 TABLETS EVERY EVENING FOR 1 DAY AS NEEDED FOR FLARE    . Vitamin D, Ergocalciferol, (DRISDOL) 50000 units CAPS capsule Take 50,000 Units by mouth every Sunday.    . VITAMIN K, PHYTONADIONE, PO Take 2 tablets by mouth daily.     . zinc gluconate 50 MG tablet Take 50-100 mg by mouth 2 (two) times daily. Takes 100mg in the morning and 50mg later in the day     No current facility-administered medications for this visit.    Allergies: Celery oil, Doxycycline, Rye  grass flower pollen extract [gramineae pollens], Sulfa antibiotics, Tea, and Adhesive [tape]  Past Medical History:  Diagnosis Date  . Abrasion of right leg 12/17/2014  . Adrenal insufficiency (HCC)   . Breast cancer of upper-outer quadrant of left female breast (HCC) 12/07/2014  . Cataract, immature    bilateral  . Dental bridge present    upper - x 2  . Dental crowns present   . Hypothyroidism   . Personal history of radiation therapy   . Prediabetes     Past Surgical History:  Procedure Laterality Date  . ABDOMINOPLASTY    . BALLOON SINUPLASTY    . BREAST LUMPECTOMY Left 2016  . BREAST LUMPECTOMY WITH RADIOACTIVE SEED AND SENTINEL LYMPH NODE BIOPSY Left 12/22/2014   Procedure: BREAST LUMPECTOMY WITH RADIOACTIVE SEED AND SENTINEL LYMPH NODE BIOPSY;  Surgeon: Faera Byerly, MD;  Location: [facility];  Service: General;  Laterality: Left;  . BUNIONECTOMY Right   . HAMMER TOE SURGERY Bilateral   . RHINOPLASTY    . SEPTOPLASTY      Family History  Problem Relation Age of Onset  . Diabetes Mother   . Diabetes Father   . Cancer Maternal Uncle        pancreatic cancer   . Diabetes Paternal Grandmother   . Diabetes Paternal Grandfather   . Breast cancer Sister     Social History   Tobacco Use  . Smoking status: Former Smoker    Packs/day: 0.00    Years: 0.00    Pack years: 0.00    Quit date: 05/01/1980    Years since quitting: 39.8  . Smokeless tobacco: Never Used  Substance Use Topics  . Alcohol use: Yes    Alcohol/week: 0.0 standard drinks    Comment: occasionally    Subjective:  Patient has been having the majority of her healthcare needs managed by her integrative healthcare provider; however, that provider is not going to prescribe medications any longer and patient would like our office to take over prescriptive treatments for her; Needs to review medication list today and make sure up to date;    Objective:  Vitals:   03/01/20 1102  BP: (!) 144/72   Pulse: 94  Temp: 98.7 F (37.1 C)  TempSrc: Oral  SpO2: 96%  Weight: 115 lb 9.6 oz (52.4 kg)  Height: 5' 3" (1.6 m)    General: Well developed, well nourished, in no acute distress  Skin : Warm and dry.  Head: Normocephalic and atraumatic    Eyes: Sclera and conjunctiva clear; pupils round and reactive to light; extraocular movements intact  Ears: External normal; canals clear; tympanic membranes normal  Oropharynx: Pink, supple. No suspicious lesions  Neck: Supple without thyromegaly, adenopathy  Lungs: Respirations unlabored; clear to auscultation bilaterally without wheeze, rales, rhonchi  CVS exam: normal rate and regular rhythm.  Neurologic: Alert and oriented; speech intact; face symmetrical; moves all extremities well; CNII-XII intact without focal deficit   Assessment:  1. Hypothyroidism, unspecified type   2. Pre-diabetes   3. Lipid screening   4. TSH (thyroid-stimulating hormone deficiency)   5. Adrenal insufficiency ([place])     Plan:  Extensive medication review done today; Time spent 30 minutes;  1. Check TSH today; 2. Check Hgba1c today; 3. Check lipid panel today; patient defers statin therapy at this time; 4. No records are available for review; she is currently taking Prednisone 4 mg daily; will need to refer to endocrine for further evaluation/ discussion;   This visit occurred during the SARS-CoV-2 public health emergency.  Safety protocols were in place, including screening questions prior to the visit, additional usage of staff PPE, and extensive cleaning of exam room while observing appropriate contact time as indicated for disinfecting solutions.     No follow-ups on file.  Orders Placed This Encounter  Procedures  . Comp Met (CMET)    Standing Status:   Future    Number of Occurrences:   1    Standing Expiration Date:   03/01/2021  . CBC with Differential/Platelet    Standing Status:   Future    Number of Occurrences:   1    Standing Expiration  Date:   03/01/2021  . Hemoglobin A1c    Standing Status:   Future    Number of Occurrences:   1    Standing Expiration Date:   03/01/2021  . Lipid panel    Standing Status:   Future    Number of Occurrences:   1    Standing Expiration Date:   03/01/2021  . Ambulatory referral to Endocrinology    Referral Priority:   Routine    Referral Type:   Consultation    Referral Reason:   Specialty Services Required    Number of Visits Requested:   1    Requested Prescriptions   Signed Prescriptions Disp Refills  . clonazePAM (KLONOPIN) 1 MG tablet 30 tablet 2    Sig: Take 1 tablet (1 mg total) by mouth at bedtime.  . cyclobenzaprine (FLEXERIL) 10 MG tablet 90 tablet 0    Sig: Take 1 tablet (10 mg total) by mouth 3 (three) times daily as needed for muscle spasms.  . montelukast (SINGULAIR) 10 MG tablet 90 tablet 3    Sig: Take 1 tablet (10 mg total) by mouth at bedtime.

## 2020-03-01 NOTE — Progress Notes (Signed)
X-ray lumbar spine shows some arthritis in the low back worse at L2-3

## 2020-03-01 NOTE — Telephone Encounter (Signed)
Patient called following up on an MRI order that she was supposed to have done through the [facility] in [place]. She said that she has not heard from anyone. Can you check to make sure the order was sent?

## 2020-03-01 NOTE — Telephone Encounter (Signed)
MRI ordered.  Should take a day or 2 to get authorized you should hear soon.

## 2020-03-02 ENCOUNTER — Other Ambulatory Visit (INDEPENDENT_AMBULATORY_CARE_PROVIDER_SITE_OTHER): Payer: PPO

## 2020-03-02 DIAGNOSIS — E039 Hypothyroidism, unspecified: Secondary | ICD-10-CM

## 2020-03-02 LAB — TSH: TSH: 0.02 u[IU]/mL — ABNORMAL LOW (ref 0.35–4.50)

## 2020-03-05 ENCOUNTER — Ambulatory Visit: Payer: PPO | Attending: Family Medicine | Admitting: Physical Therapy

## 2020-03-05 ENCOUNTER — Encounter: Payer: Self-pay | Admitting: Physical Therapy

## 2020-03-05 ENCOUNTER — Other Ambulatory Visit: Payer: Self-pay

## 2020-03-05 DIAGNOSIS — M6281 Muscle weakness (generalized): Secondary | ICD-10-CM | POA: Insufficient documentation

## 2020-03-05 DIAGNOSIS — M25551 Pain in right hip: Secondary | ICD-10-CM | POA: Insufficient documentation

## 2020-03-05 DIAGNOSIS — M62838 Other muscle spasm: Secondary | ICD-10-CM | POA: Insufficient documentation

## 2020-03-05 DIAGNOSIS — M79604 Pain in right leg: Secondary | ICD-10-CM

## 2020-03-05 DIAGNOSIS — R2689 Other abnormalities of gait and mobility: Secondary | ICD-10-CM | POA: Insufficient documentation

## 2020-03-05 NOTE — Therapy (Signed)
[facility] [address], [place], [place], [address] Phone: [phone]   Fax:  [phone]  Physical Therapy Treatment  Patient Details  Name: Christina [name] MRN: [id number] Date of Birth: May 29, 1950 Referring Provider (PT): Gregor Hams, MD   Encounter Date: 03/05/2020   PT End of Session - 03/05/20 1014    Visit Number 6    Date for PT Re-Evaluation 04/21/20    Authorization Type [employer]    Progress Note Due on Visit 10    PT Start Time 1015    PT Stop Time 1102    PT Time Calculation (min) 47 min    Activity Tolerance No increased pain;Other (comment)   limited by weakness   Behavior During Therapy [facility] for tasks assessed/performed           Past Medical History:  Diagnosis Date  . Abrasion of right leg 12/17/2014  . Adrenal insufficiency ([place])   . Breast cancer of upper-outer quadrant of left female breast ([place]) 12/07/2014  . Cataract, immature    bilateral  . Dental bridge present    upper - x 2  . Dental crowns present   . Hypothyroidism   . Personal history of radiation therapy   . Prediabetes     Past Surgical History:  Procedure Laterality Date  . ABDOMINOPLASTY    . BALLOON SINUPLASTY    . BREAST LUMPECTOMY Left 2016  . BREAST LUMPECTOMY WITH RADIOACTIVE SEED AND SENTINEL LYMPH NODE BIOPSY Left 12/22/2014   Procedure: BREAST LUMPECTOMY WITH RADIOACTIVE SEED AND SENTINEL LYMPH NODE BIOPSY;  Surgeon: Stark Klein, MD;  Location: [facility];  Service: General;  Laterality: Left;  . BUNIONECTOMY Right   . HAMMER TOE SURGERY Bilateral   . RHINOPLASTY    . SEPTOPLASTY      There were no vitals filed for this visit.   Subjective Assessment - 03/05/20 1020    Subjective I continue to feel weakness and pain in front of Rt hip and thigh.    How long can you walk comfortably? cannot walk comfortably    Diagnostic tests MRI Rt hip 08/19/19: mod degen, spurring,  superior labrum degen/torn, MRI lumbar 2018 multi-level disc dessication and lateral stenosis, right foraminal disc extrusion L3/4 with right L3 nerve root impingement    Patient Stated Goals be able to walk    Currently in Pain? Yes    Pain Score 8     Pain Location Hip    Pain Orientation Right    Pain Descriptors / Indicators Tiring    Pain Type Acute pain;Chronic pain    Pain Onset More than a month ago    Pain Frequency Intermittent    Aggravating Factors  walking, weakness > pain                             [facility] Adult PT Treatment/Exercise - 03/05/20 0001      Ambulation/Gait   Gait Comments TCs for transverse pelvic motion, hip stability, advancement of limb via hip flexors      Neuro Re-ed    Neuro Re-ed Details  weight shifting UE support on edge of treadmill x 10 each, Rt hemipelvis pelvic PNF 2:00 to 8:00 manual facilitation and TCs      Lumbar Exercises: Aerobic   Nustep L2 x 4', tapered to L1 last min      Knee/Hip Exercises: Seated   Other  Seated Knee/Hip Exercises seated ankle DF bil x 15, slow eccentric lowering    Sit to Sand 10 reps;without UE support      Knee/Hip Exercises: Supine   Short Arc Quad Sets Strengthening;Right;2 sets;5 reps    Short Arc Quad Sets Limitations with ball squeeze    Straight Leg Raises AAROM;5 reps;2 sets    Straight Leg Raises Limitations small range from SAQ bolster with PT assist and tapping at proximal quad   improved hip flexion recruitment noted from last visit   Other Supine Knee/Hip Exercises Rt hip isometrics 4-way in 90/90 Rt LE position, PT provided manual resistance 3x5 sec holds each way    Other Supine Knee/Hip Exercises hip flexion isometric Rt 10x5 feet on rounded bolster                    PT Short Term Goals - 02/05/20 1027      PT SHORT TERM GOAL #1   Title Pt will be ind and consistent with high frequency low rep initial HEP to strengthen Rt hip and thigh with respect of Rt LE  fatigue with ther ex.    Period Weeks    Status On-going             PT Long Term Goals - 01/28/20 1406      PT LONG TERM GOAL #1   Title Pt with achieve Rt hip flexor and quad strength of at least 4+/5 for improved daily task endurance and ambulation.    Baseline -    Time 12    Period Weeks    Status New    Target Date 04/21/20      PT LONG TERM GOAL #2   Title Pt will be able to participate in 6 min walk test with step through gait pattern and consistent heel strike on Rt LE due to improved Rt LE hip advancement.    Baseline step through becomes step to with foot shuffle secondary to Rt hip flexor fatigue within 20-30 feet    Time 12    Period Weeks    Status New    Target Date 04/21/20      PT LONG TERM GOAL #3   Title Pt will report at least 70% improvement in Rt LE fatigue so that she may be more active throughout the day.    Baseline -    Time 12    Period Weeks    Status New    Target Date 04/21/20      PT LONG TERM GOAL #4   Title Pt will be able to demo safety and stability with curbs and stairs for community ambulation and public access for outings.    Baseline -    Time 12    Period Weeks    Status New    Target Date 04/21/20      PT LONG TERM GOAL #5   Title Pt will be ind with advanced HEP    Baseline -    Time 12    Period Weeks    Status New    Target Date 04/21/20                 Plan - 03/05/20 1222    Clinical Impression Statement Pt continues to have fatiguable weakness in Rt proximal anterior and medial hip.  She d/c'd using OTC AFO assist due to more pain/trouble than helpful.  She is getting an MRI tomorrow and is scheduled for an  ESI next week.  Previous ESI helped pain but she reports this is more weakness than pain so unsure if will help.  She continues to benefit from short reps with frequent rest due to fatigue of targeted muscles of Rt hip.  She benefits from tactile input to proximal Rt hip during ther ex as well as  intermittent traction to Rt LE.  PT used pelvic PNF, weight shifts, and TCs with gait to provide more input into Rt hemipelvic function.  Pt needs ongoing assessment of response to treatment.  Discussed that she should continue to participate in PT through [facility] to monitor effectiveness of shot on weakness of Rt LE.    PT Frequency 2x / week    PT Duration 12 weeks    PT Treatment/Interventions ADLs/Self Care Home Management;Electrical Stimulation;Neuromuscular re-education;Therapeutic exercise;Functional mobility training;Stair training;Gait training;Therapeutic activities;Manual techniques;Patient/family education;Dry needling;Passive range of motion;Joint Manipulations;Spinal Manipulations    PT Next Visit Plan Pt getting MRI before next visit, continue Rt proximal hip strength, weight shifts, gait training    PT Home Exercise Plan Access Code: [email or web address]    Consulted and Agree with Plan of Care Patient           Patient will benefit from skilled therapeutic intervention in order to improve the following deficits and impairments:     Visit Diagnosis: Muscle weakness (generalized)  Other abnormalities of gait and mobility  Pain in right hip  Pain in right leg     Problem List Patient Active Problem List   Diagnosis Date Noted  . Arthritis of right hip 09/17/2019  . Labral tear of right hip joint 08/06/2019  . Gluteal tendonitis of right buttock 05/22/2019  . Tear of left gluteus medius tendon 01/04/2019  . Arthritis of left hip 03/20/2018  . Greater trochanteric bursitis of right hip 06/13/2017  . 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 Breast cancer of upper-outer quadrant of left female breast ([place]) 12/07/2014  . Acute recurrent sinusitis 01/17/2013    Baruch Merl, PT 03/05/20 12:28 PM   [facility] [address], [place], [place], [address] Phone: [phone]   Fax:  [phone]  Name:  Christina [name] MRN: [id number] Date of Birth: 08-14-1950

## 2020-03-06 ENCOUNTER — Ambulatory Visit (INDEPENDENT_AMBULATORY_CARE_PROVIDER_SITE_OTHER): Payer: PPO

## 2020-03-06 DIAGNOSIS — M5136 Other intervertebral disc degeneration, lumbar region: Secondary | ICD-10-CM

## 2020-03-06 DIAGNOSIS — M79651 Pain in right thigh: Secondary | ICD-10-CM

## 2020-03-06 DIAGNOSIS — S3421XD Injury of nerve root of lumbar spine, subsequent encounter: Secondary | ICD-10-CM

## 2020-03-06 DIAGNOSIS — M47816 Spondylosis without myelopathy or radiculopathy, lumbar region: Secondary | ICD-10-CM

## 2020-03-06 DIAGNOSIS — M48061 Spinal stenosis, lumbar region without neurogenic claudication: Secondary | ICD-10-CM | POA: Diagnosis not present

## 2020-03-06 DIAGNOSIS — M545 Low back pain, unspecified: Secondary | ICD-10-CM | POA: Diagnosis not present

## 2020-03-06 DIAGNOSIS — R29898 Other symptoms and signs involving the musculoskeletal system: Secondary | ICD-10-CM

## 2020-03-08 NOTE — Progress Notes (Signed)
MRI lumbar spine shows not significant change from baseline previous MRI 2018.  However it does show severely compressed right L3 nerve root at L3-4.  The epidural steroid injection that already has been ordered should help.  Please let me know how you are feeling a week or 2 after the injection

## 2020-03-09 ENCOUNTER — Ambulatory Visit: Payer: PPO

## 2020-03-09 ENCOUNTER — Other Ambulatory Visit: Payer: Self-pay

## 2020-03-09 DIAGNOSIS — M6281 Muscle weakness (generalized): Secondary | ICD-10-CM

## 2020-03-09 DIAGNOSIS — M25551 Pain in right hip: Secondary | ICD-10-CM

## 2020-03-09 DIAGNOSIS — R2689 Other abnormalities of gait and mobility: Secondary | ICD-10-CM

## 2020-03-09 DIAGNOSIS — M62838 Other muscle spasm: Secondary | ICD-10-CM

## 2020-03-09 DIAGNOSIS — M79604 Pain in right leg: Secondary | ICD-10-CM

## 2020-03-09 NOTE — Therapy (Signed)
[facility] [address], [place], [place], [address] Phone: [phone]   Fax:  [phone]  Physical Therapy Treatment  Patient Details  Name: Christina [name] MRN: [id number] Date of Birth: 1950/09/26 Referring Provider (PT): Gregor Hams, MD   Encounter Date: 03/09/2020   PT End of Session - 03/09/20 1140    Visit Number 7    Date for PT Re-Evaluation 04/21/20    Authorization Type [employer]    Progress Note Due on Visit 10    PT Start Time 1104    PT Stop Time 1138    PT Time Calculation (min) 34 min    Activity Tolerance Patient limited by fatigue    Behavior During Therapy [facility] for tasks assessed/performed           Past Medical History:  Diagnosis Date  . Abrasion of right leg 12/17/2014  . Adrenal insufficiency ([place])   . Breast cancer of upper-outer quadrant of left female breast ([place]) 12/07/2014  . Cataract, immature    bilateral  . Dental bridge present    upper - x 2  . Dental crowns present   . Hypothyroidism   . Personal history of radiation therapy   . Prediabetes     Past Surgical History:  Procedure Laterality Date  . ABDOMINOPLASTY    . BALLOON SINUPLASTY    . BREAST LUMPECTOMY Left 2016  . BREAST LUMPECTOMY WITH RADIOACTIVE SEED AND SENTINEL LYMPH NODE BIOPSY Left 12/22/2014   Procedure: BREAST LUMPECTOMY WITH RADIOACTIVE SEED AND SENTINEL LYMPH NODE BIOPSY;  Surgeon: Stark Klein, MD;  Location: [place];  Service: General;  Laterality: Left;  . BUNIONECTOMY Right   . HAMMER TOE SURGERY Bilateral   . RHINOPLASTY    . SEPTOPLASTY      There were no vitals filed for this visit.   Subjective Assessment - 03/09/20 1105    Subjective I still cant walk.  MRI Saturday- not much change from last MRI in 2018.  will have injection at L3 tomorrow.    Patient Stated Goals be able to walk    Currently in Pain? No/denies                              [facility] Adult PT Treatment/Exercise - 03/09/20 0001      Lumbar Exercises: Aerobic   Nustep L2 x 4', tapered to L1 last min      Knee/Hip Exercises: Standing   Other Standing Knee Exercises Rt hip flexion to step tap on edge of treadmill x20 on Rt      Knee/Hip Exercises: Supine   Short Arc Quad Sets Strengthening;2 sets;10 reps    Short Arc Quad Sets Limitations with ball squeeze    Straight Leg Raises AAROM;5 reps;2 sets    Straight Leg Raises Limitations small range from SAQ bolster with PT assist and tapping at proximal quad   improved hip flexion recruitment noted from last visit     Knee/Hip Exercises: Prone   Hip Extension Strengthening;Right;2 sets;5 reps    Hip Extension Limitations minimal ROM                    PT Short Term Goals - 02/05/20 1027      PT SHORT TERM GOAL #1   Title Pt will be ind and consistent with high frequency low rep initial HEP to strengthen Rt hip and thigh  with respect of Rt LE fatigue with ther ex.    Period Weeks    Status On-going             PT Long Term Goals - 01/28/20 1406      PT LONG TERM GOAL #1   Title Pt with achieve Rt hip flexor and quad strength of at least 4+/5 for improved daily task endurance and ambulation.    Baseline -    Time 12    Period Weeks    Status New    Target Date 04/21/20      PT LONG TERM GOAL #2   Title Pt will be able to participate in 6 min walk test with step through gait pattern and consistent heel strike on Rt LE due to improved Rt LE hip advancement.    Baseline step through becomes step to with foot shuffle secondary to Rt hip flexor fatigue within 20-30 feet    Time 12    Period Weeks    Status New    Target Date 04/21/20      PT LONG TERM GOAL #3   Title Pt will report at least 70% improvement in Rt LE fatigue so that she may be more active throughout the day.    Baseline -    Time 12    Period Weeks    Status New    Target Date 04/21/20       PT LONG TERM GOAL #4   Title Pt will be able to demo safety and stability with curbs and stairs for community ambulation and public access for outings.    Baseline -    Time 12    Period Weeks    Status New    Target Date 04/21/20      PT LONG TERM GOAL #5   Title Pt will be ind with advanced HEP    Baseline -    Time 12    Period Weeks    Status New    Target Date 04/21/20                 Plan - 03/09/20 1120    Clinical Impression Statement Pt had MRI on Saturday and will have injection at L3 on the Rt tomorrow.  MRI didn't show much change from 2018. Pt continues to have fatiguable weakness in Rt proximal anterior and medial hip.   She continues to benefit from short reps with frequent rest due to fatigue of targeted muscles of Rt anterior hip.  PT provided tactile cueing to activate quad and hip flexor and verbal cues for speed and control of movement.    Pt needs ongoing assessment of response to treatment.  Pt will continue to benefit from skilled PT to address Rt LE weakness and gait abnormality.    Rehab Potential Good    PT Frequency 2x / week    PT Duration 12 weeks    PT Treatment/Interventions ADLs/Self Care Home Management;Electrical Stimulation;Neuromuscular re-education;Therapeutic exercise;Functional mobility training;Stair training;Gait training;Therapeutic activities;Manual techniques;Patient/family education;Dry needling;Passive range of motion;Joint Manipulations;Spinal Manipulations    PT Next Visit Plan see how injection goes, gait, weight shifting, proximal hip strength    PT Home Exercise Plan Access Code: [email or web address]    Consulted and Agree with Plan of Care Patient           Patient will benefit from skilled therapeutic intervention in order to improve the following deficits and impairments:  Abnormal gait, Decreased strength, Decreased endurance, Impaired flexibility,  Postural dysfunction, Pain, Difficulty walking  Visit Diagnosis: Other  abnormalities of gait and mobility  Pain in right leg  Pain in right hip  Other muscle spasm  Muscle weakness (generalized)     Problem List Patient Active Problem List   Diagnosis Date Noted  . Arthritis of right hip 09/17/2019  . Labral tear of right hip joint 08/06/2019  . Gluteal tendonitis of right buttock 05/22/2019  . Tear of left gluteus medius tendon 01/04/2019  . Arthritis of left hip 03/20/2018  . Greater trochanteric bursitis of right hip 06/13/2017  . 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 Breast cancer of upper-outer quadrant of left female breast ([place]) 12/07/2014  . Acute recurrent sinusitis 01/17/2013     Sigurd Sos, PT 03/09/20 11:44 AM  [facility] [address] [place], [place], [address] Phone: [phone]   Fax:  [phone]  Name: RAECHEL MARCOS MRN: [id number] Date of Birth: 1950/05/05

## 2020-03-10 ENCOUNTER — Ambulatory Visit
Admission: RE | Admit: 2020-03-10 | Discharge: 2020-03-10 | Disposition: A | Discharge status: Home / Self Care | Payer: PPO | Source: Ambulatory Visit | Attending: Family Medicine | Admitting: Family Medicine

## 2020-03-10 DIAGNOSIS — M79651 Pain in right thigh: Secondary | ICD-10-CM

## 2020-03-10 DIAGNOSIS — S3421XD Injury of nerve root of lumbar spine, subsequent encounter: Secondary | ICD-10-CM

## 2020-03-10 DIAGNOSIS — R29898 Other symptoms and signs involving the musculoskeletal system: Secondary | ICD-10-CM

## 2020-03-10 DIAGNOSIS — M47817 Spondylosis without myelopathy or radiculopathy, lumbosacral region: Secondary | ICD-10-CM | POA: Diagnosis not present

## 2020-03-10 MED ORDER — METHYLPREDNISOLONE ACETATE 40 MG/ML INJ SUSP (RADIOLOG
120.0000 mg | Freq: Once | INTRAMUSCULAR | Status: AC
  Administered 2020-03-10: 120 mg via EPIDURAL

## 2020-03-10 MED ORDER — IOPAMIDOL (ISOVUE-M 200) INJECTION 41%
1.0000 mL | Freq: Once | INTRAMUSCULAR | Status: AC
  Administered 2020-03-10: 1 mL via EPIDURAL

## 2020-03-10 NOTE — Discharge Instructions (Signed)

## 2020-03-12 ENCOUNTER — Encounter: Payer: Self-pay | Admitting: Internal Medicine

## 2020-03-16 ENCOUNTER — Encounter: Payer: Self-pay | Admitting: Physical Therapy

## 2020-03-16 ENCOUNTER — Other Ambulatory Visit: Payer: Self-pay

## 2020-03-16 ENCOUNTER — Ambulatory Visit: Payer: PPO | Admitting: Physical Therapy

## 2020-03-16 DIAGNOSIS — R2689 Other abnormalities of gait and mobility: Secondary | ICD-10-CM

## 2020-03-16 DIAGNOSIS — M79604 Pain in right leg: Secondary | ICD-10-CM

## 2020-03-16 DIAGNOSIS — M25551 Pain in right hip: Secondary | ICD-10-CM

## 2020-03-16 DIAGNOSIS — M6281 Muscle weakness (generalized): Secondary | ICD-10-CM | POA: Diagnosis not present

## 2020-03-16 NOTE — Patient Instructions (Signed)
Access Code: [email or web address] URL: [email or web address] Date: 03/16/2020 Prepared by: Venetia Night Kiani Wurtzel  Exercises Seated Long Arc Quad - 3 x daily - 7 x weekly - 2 sets - 5 reps Seated Toe Raise - 3 x daily - 7 x weekly - 2 sets - 20 reps Seated Hip Adduction Isometrics with Ball - 1 x daily - 7 x weekly - 2 sets - 5 reps - 5 hold Seated Hamstring Stretch - 1 x daily - 7 x weekly - 1 sets - 2 reps - 30 hold Supine Piriformis Stretch with Leg Straight - 2 x daily - 7 x weekly - 1 sets - 2 reps - 30 sec hold Standing Hip Extension with Counter Support - 1 x daily - 3-4 x weekly - 2 sets - 5 reps Standing Hip Abduction with Counter Support - 1 x daily - 3-4 x weekly - 2 sets - 5 reps Standing Hip Flexion with Counter Support - 1 x daily - 7 x weekly - 3 sets - 10 reps Standing Marching - 1 x daily - 7 x weekly - 3 sets - 10 reps

## 2020-03-16 NOTE — Therapy (Signed)
[facility] [address] [place], [place], [address] Phone: [phone]   Fax:  [phone]  Physical Therapy Treatment  Patient Details  Name: Christina [name] MRN: [id number] Date of Birth: 1950/08/07 Referring Provider (PT): Gregor Hams, MD   Encounter Date: 03/16/2020   PT End of Session - 03/16/20 1154    Visit Number 8    Date for PT Re-Evaluation 04/21/20    Authorization Type [employer]    Progress Note Due on Visit 10    PT Start Time 1150    PT Stop Time 1223    PT Time Calculation (min) 33 min    Activity Tolerance Patient tolerated treatment well    Behavior During Therapy [facility] for tasks assessed/performed           Past Medical History:  Diagnosis Date  . Abrasion of right leg 12/17/2014  . Adrenal insufficiency ([place])   . Breast cancer of upper-outer quadrant of left female breast ([place]) 12/07/2014  . Cataract, immature    bilateral  . Dental bridge present    upper - x 2  . Dental crowns present   . Hypothyroidism   . Personal history of radiation therapy   . Prediabetes     Past Surgical History:  Procedure Laterality Date  . ABDOMINOPLASTY    . BALLOON SINUPLASTY    . BREAST LUMPECTOMY Left 2016  . BREAST LUMPECTOMY WITH RADIOACTIVE SEED AND SENTINEL LYMPH NODE BIOPSY Left 12/22/2014   Procedure: BREAST LUMPECTOMY WITH RADIOACTIVE SEED AND SENTINEL LYMPH NODE BIOPSY;  Surgeon: Stark Klein, MD;  Location: [place];  Service: General;  Laterality: Left;  . BUNIONECTOMY Right   . HAMMER TOE SURGERY Bilateral   . RHINOPLASTY    . SEPTOPLASTY      There were no vitals filed for this visit.   Subjective Assessment - 03/16/20 1151    Subjective I had improved weakness x 2-3 days after the injection but then the weakness returned abruptly.  I am making myself go walk but the Rt leg gets so tired after 5-10 min.  I can do about 20 min on the NuStep at the gym.  I  am doing leg machines at the gym.  I'm going 1x/week.    How long can you walk comfortably? cannot walk comfortably    Diagnostic tests MRI Rt hip 08/19/19: mod degen, spurring, superior labrum degen/torn, MRI lumbar 2018 multi-level disc dessication and lateral stenosis, right foraminal disc extrusion L3/4 with right L3 nerve root impingement    Patient Stated Goals be able to walk    Currently in Pain? No/denies    Pain Onset More than a month ago                             [facility] Adult PT Treatment/Exercise - 03/16/20 0001      Ambulation/Gait   Gait Comments PT cued focused advancement of Rt LE from hip, heel strike, and maintenance of step length on Rt      Exercises   Exercises Lumbar;Knee/Hip      Lumbar Exercises: Stretches   Active Hamstring Stretch Right;Left;1 rep;30 seconds    Active Hamstring Stretch Limitations seated    Figure 4 Stretch 2 reps;20 seconds;With overpressure;Seated      Lumbar Exercises: Aerobic   Nustep L1 x 8', Pt reported Rt LE fatigue with this      Lumbar  Exercises: Standing   Heel Raises 20 reps    Heel Raises Limitations bil      Lumbar Exercises: Seated   Long Arc Quad on Chair Right;AROM;1 set;15 reps      Knee/Hip Exercises: Stretches   Piriformis Stretch Right;Left;1 rep;30 seconds      Knee/Hip Exercises: Standing   Hip Flexion Stengthening;Both;2 sets;10 reps;Knee bent;Knee straight    Hip Flexion Limitations bil UE support march taps edge of TM, then SLR flexion 1x10 bil    Hip Abduction Stengthening;Both;1 set;5 reps;Knee straight    Hip Extension Stengthening;Both;1 set;5 reps;Knee straight      Knee/Hip Exercises: Seated   Ball Squeeze 2 sets 5x5 sec holds Rt LE focus      Knee/Hip Exercises: Supine   Bridges Strengthening;Both;1 set;10 reps    Bridges Limitations with ball squeeze    Straight Leg Raises Strengthening;AROM;2 sets;5 reps      Knee/Hip Exercises: Sidelying   Hip ABduction  AROM;Strengthening;Right;2 sets;5 reps    Hip ADduction Strengthening;Right;1 set;10 reps                  PT Education - 03/16/20 1221    Education Details Access Code: [email or web address]    Person(s) Educated Patient    Methods Explanation;Handout;Demonstration    Comprehension Verbalized understanding;Returned demonstration            PT Short Term Goals - 02/05/20 1027      PT SHORT TERM GOAL #1   Title Pt will be ind and consistent with high frequency low rep initial HEP to strengthen Rt hip and thigh with respect of Rt LE fatigue with ther ex.    Period Weeks    Status On-going             PT Long Term Goals - 01/28/20 1406      PT LONG TERM GOAL #1   Title Pt with achieve Rt hip flexor and quad strength of at least 4+/5 for improved daily task endurance and ambulation.    Baseline -    Time 12    Period Weeks    Status New    Target Date 04/21/20      PT LONG TERM GOAL #2   Title Pt will be able to participate in 6 min walk test with step through gait pattern and consistent heel strike on Rt LE due to improved Rt LE hip advancement.    Baseline step through becomes step to with foot shuffle secondary to Rt hip flexor fatigue within 20-30 feet    Time 12    Period Weeks    Status New    Target Date 04/21/20      PT LONG TERM GOAL #3   Title Pt will report at least 70% improvement in Rt LE fatigue so that she may be more active throughout the day.    Baseline -    Time 12    Period Weeks    Status New    Target Date 04/21/20      PT LONG TERM GOAL #4   Title Pt will be able to demo safety and stability with curbs and stairs for community ambulation and public access for outings.    Baseline -    Time 12    Period Weeks    Status New    Target Date 04/21/20      PT LONG TERM GOAL #5   Title Pt will be ind with advanced HEP  Baseline -    Time 12    Period Weeks    Status New    Target Date 04/21/20                 Plan - 03/16/20 1228     Clinical Impression Statement Pt reports improved strength in Rt LE x 2 days following injection but then return of weakness.  PT noted improved tolerance of ther ex and ability to perform A/ROM for SLR 2x5 reps today as compared to previously needing AA/ROM.  Pt continues to have fatigue with prolonged walking where she loses step length and heel strike due to proximal hip flexor fatigue.  She is going to the gym and has improved tolerance on NuStep to 20 min. PT updated HEP and encouraged her to do some form of strength daily with ongoing monitoring of progress vs decline.  PT recommends d/c to HEP at this time as she is independent with ther ex.  Pt interested in potential contact with MD regarding option for another injection to see if further improvement in strength is gained.    PT Frequency 2x / week    PT Duration 12 weeks    PT Treatment/Interventions ADLs/Self Care Home Management;Electrical Stimulation;Neuromuscular re-education;Therapeutic exercise;Functional mobility training;Stair training;Gait training;Therapeutic activities;Manual techniques;Patient/family education;Dry needling;Passive range of motion;Joint Manipulations;Spinal Manipulations    PT Next Visit Plan d/c to HEP    PT Home Exercise Plan Access Code: [email or web address]    Consulted and Agree with Plan of Care Patient           Patient will benefit from skilled therapeutic intervention in order to improve the following deficits and impairments:     Visit Diagnosis: Other abnormalities of gait and mobility  Pain in right leg  Pain in right hip  Muscle weakness (generalized)     Problem List Patient Active Problem List   Diagnosis Date Noted  . Arthritis of right hip 09/17/2019  . Labral tear of right hip joint 08/06/2019  . Gluteal tendonitis of right buttock 05/22/2019  . Tear of left gluteus medius tendon 01/04/2019  . Arthritis of left hip 03/20/2018  . Greater trochanteric bursitis of right hip 06/13/2017  .  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 Breast cancer of upper-outer quadrant of left female breast ([place]) 12/07/2014  . Acute recurrent sinusitis 01/17/2013    PHYSICAL THERAPY DISCHARGE SUMMARY  Visits from Start of Care: 8  Current functional level related to goals / functional outcomes: Pt with improving tolerance of there ex before fatigue since injection.  She felt much improvement x 2 days with regression of improvement, but PT noted more strength as compared to pre-injection status.   Pt feels ready to progress to ind with HEP and gym time.    Remaining deficits: See above   Education / Equipment: HEP Plan: Patient agrees to discharge.  Patient goals were partially met. Patient is being discharged due to being pleased with the current functional level.  ?????         Baruch Merl, PT 03/16/20 4:45 PM   [facility] [address] [place], [place], [address] Phone: [phone]   Fax:  [phone]  Name: Christina [name] MRN: [id number] Date of Birth: Feb 04, 1951

## 2020-03-18 ENCOUNTER — Encounter: Payer: Self-pay | Admitting: Family Medicine

## 2020-03-18 DIAGNOSIS — M79651 Pain in right thigh: Secondary | ICD-10-CM

## 2020-03-18 DIAGNOSIS — R29898 Other symptoms and signs involving the musculoskeletal system: Secondary | ICD-10-CM

## 2020-03-18 DIAGNOSIS — S3421XD Injury of nerve root of lumbar spine, subsequent encounter: Secondary | ICD-10-CM

## 2020-03-19 ENCOUNTER — Ambulatory Visit: Payer: PPO | Admitting: Physical Therapy

## 2020-03-22 DIAGNOSIS — Z85828 Personal history of other malignant neoplasm of skin: Secondary | ICD-10-CM | POA: Diagnosis not present

## 2020-03-22 DIAGNOSIS — D2271 Melanocytic nevi of right lower limb, including hip: Secondary | ICD-10-CM | POA: Diagnosis not present

## 2020-03-22 DIAGNOSIS — D692 Other nonthrombocytopenic purpura: Secondary | ICD-10-CM | POA: Diagnosis not present

## 2020-03-22 DIAGNOSIS — L570 Actinic keratosis: Secondary | ICD-10-CM | POA: Diagnosis not present

## 2020-03-22 DIAGNOSIS — L0109 Other impetigo: Secondary | ICD-10-CM | POA: Diagnosis not present

## 2020-03-22 DIAGNOSIS — L820 Inflamed seborrheic keratosis: Secondary | ICD-10-CM | POA: Diagnosis not present

## 2020-03-22 DIAGNOSIS — L821 Other seborrheic keratosis: Secondary | ICD-10-CM | POA: Diagnosis not present

## 2020-03-23 ENCOUNTER — Encounter: Payer: PPO | Admitting: Physical Therapy

## 2020-03-31 ENCOUNTER — Other Ambulatory Visit: Payer: Self-pay | Admitting: Family Medicine

## 2020-03-31 ENCOUNTER — Ambulatory Visit
Admission: RE | Admit: 2020-03-31 | Discharge: 2020-03-31 | Disposition: A | Discharge status: Home / Self Care | Payer: PPO | Source: Ambulatory Visit | Attending: Family Medicine | Admitting: Family Medicine

## 2020-03-31 ENCOUNTER — Other Ambulatory Visit: Payer: Self-pay

## 2020-03-31 DIAGNOSIS — S3421XD Injury of nerve root of lumbar spine, subsequent encounter: Secondary | ICD-10-CM

## 2020-03-31 DIAGNOSIS — M47817 Spondylosis without myelopathy or radiculopathy, lumbosacral region: Secondary | ICD-10-CM | POA: Diagnosis not present

## 2020-03-31 DIAGNOSIS — R29898 Other symptoms and signs involving the musculoskeletal system: Secondary | ICD-10-CM

## 2020-03-31 DIAGNOSIS — M79651 Pain in right thigh: Secondary | ICD-10-CM

## 2020-03-31 MED ORDER — IOPAMIDOL (ISOVUE-M 200) INJECTION 41%
1.0000 mL | Freq: Once | INTRAMUSCULAR | Status: AC
  Administered 2020-03-31: 1 mL via EPIDURAL

## 2020-03-31 MED ORDER — METHYLPREDNISOLONE ACETATE 40 MG/ML INJ SUSP (RADIOLOG
120.0000 mg | Freq: Once | INTRAMUSCULAR | Status: AC
  Administered 2020-03-31: 120 mg via EPIDURAL

## 2020-03-31 NOTE — Discharge Instructions (Signed)

## 2020-04-01 ENCOUNTER — Other Ambulatory Visit: Payer: Self-pay | Admitting: Family

## 2020-04-06 ENCOUNTER — Encounter: Payer: PPO | Admitting: Physical Therapy

## 2020-04-13 NOTE — Progress Notes (Signed)
[name] Phone: [phone] Subjective:   Christina [name], am serving as a scribe for Dr. Hulan Saas. This visit occurred during the SARS-CoV-2 public health emergency.  Safety protocols were in place, including screening questions prior to the visit, additional usage of staff PPE, and extensive cleaning of exam room while observing appropriate contact time as indicated for disinfecting solutions.   I'm seeing this patient by the request  of:  Marrian Salvage, FNP  CC: Low back and hip pain follow-up  SMO:[id number]  Christina [name] is a 69 y.o. female coming in with complaint of hip and back pain. Epidural on 03/31/2020. Has been doing PT. Saw Dr. Georgina Snell multiple times since the last time we saw patient 08/2019. Injection did not provide patient with any relief. First epidural did help. Continues to have right thigh pain. Patient feels like she needs physical therapy for strength.  Patient is concerned because she does feel like she can fall.   Patient's most recent MRI was from November 2021.  This was independently visualized by me.  Patient does have severe left L4-L5 neuroforaminal narrowing but otherwise moderate arthritic changes.  Patient does have some right neuroforaminal narrowing at L3-L4 but does not appear to fully compress the right L3 nerve root  Patient has had 2 right-sided L3 nerve root block and transforaminal epidurals one on November 10 and one on December 1.  Past Medical History:  Diagnosis Date  . Abrasion of right leg 12/17/2014  . Adrenal insufficiency ([place])   . Breast cancer of upper-outer quadrant of left female breast ([place]) 12/07/2014  . Cataract, immature    bilateral  . Dental bridge present    upper - x 2  . Dental crowns present   . Hypothyroidism   . Personal history of radiation therapy   . Prediabetes    Past Surgical History:  Procedure Laterality Date  .  ABDOMINOPLASTY    . BALLOON SINUPLASTY    . BREAST LUMPECTOMY Left 2016  . BREAST LUMPECTOMY WITH RADIOACTIVE SEED AND SENTINEL LYMPH NODE BIOPSY Left 12/22/2014   Procedure: BREAST LUMPECTOMY WITH RADIOACTIVE SEED AND SENTINEL LYMPH NODE BIOPSY;  Surgeon: Stark Klein, MD;  Location: [facility];  Service: General;  Laterality: Left;  . BUNIONECTOMY Right   . HAMMER TOE SURGERY Bilateral   . RHINOPLASTY    . SEPTOPLASTY     Social History   Socioeconomic History  . Marital status: Widowed    Spouse name: Not on file  . Number of children: Not on file  . Years of education: Not on file  . Highest education level: Not on file  Occupational History    Employer: [employer]  Tobacco Use  . Smoking status: Former Smoker    Packs/day: 0.00    Years: 0.00    Pack years: 0.00    Quit date: 05/01/1980    Years since quitting: 39.9  . Smokeless tobacco: Never Used  Vaping Use  . Vaping Use: Never used  Substance and Sexual Activity  . Alcohol use: Yes    Alcohol/week: 0.0 standard drinks    Comment: occasionally  . Drug use: [name]  . Sexual activity: Not on file  Other Topics Concern  . Not on file  Social History Narrative  . Not on file   Social Determinants of Health   Financial Resource Strain: Not on file  Food Insecurity: Not on file  Transportation  Needs: Not on file  Physical Activity: Not on file  Stress: Not on file  Social Connections: Not on file   Allergies  Allergen Reactions  . Celery Oil Other (See Comments)    FEVER BLISTERS  . Doxycycline Nausea And Vomiting  . Rye Grass Flower Pollen Extract [Gramineae Pollens] Swelling    RYE:  SWELLING THROAT - DENIES SOB  . Sulfa Antibiotics Swelling    NOSE  . Tea Swelling    THROAT - DENIES SOB  . Adhesive [Tape] Other (See Comments)    TEARS SKIN   Family History  Problem Relation Age of Onset  . Diabetes Mother   . Diabetes Father   . Cancer Maternal Uncle        pancreatic cancer   .  Diabetes Paternal Grandmother   . Diabetes Paternal Grandfather   . Breast cancer Sister     Current Outpatient Medications (Endocrine & Metabolic):  .  liothyronine (CYTOMEL) 25 MCG tablet, Take 75 mcg by mouth daily. 2 in the am; 1 at noon .  metFORMIN (GLUCOPHAGE-XR) 500 MG 24 hr tablet, Take 1,000 mg by mouth 2 (two) times daily.  .  methylPREDNISolone (MEDROL) 4 MG tablet, Take 4 mg by mouth 3 (three) times daily. Takes 4mg  in the morning    Current Outpatient Medications (Respiratory):  .  albuterol (PROVENTIL HFA;VENTOLIN HFA) 108 (90 BASE) MCG/ACT inhaler, Inhale into the lungs every 6 (six) hours as needed for wheezing or shortness of breath. .  cetirizine (ZYRTEC) 10 MG tablet, Take 10 mg by mouth daily.  .  fluticasone (FLONASE) 50 MCG/ACT nasal spray, Place 1 spray into both nostrils daily. Marland Kitchen  guaiFENesin (MUCINEX) 600 MG 12 hr tablet, Take 600 mg by mouth daily.  .  montelukast (SINGULAIR) 10 MG tablet, Take 1 tablet (10 mg total) by mouth at bedtime.  Current Outpatient Medications (Analgesics):  .  ibuprofen (ADVIL,MOTRIN) 200 MG tablet, Take 600 mg by mouth every 4 (four) hours as needed for fever, headache, mild pain, moderate pain or cramping.   Current Outpatient Medications (Other):  Marland Kitchen  Alpha Lipoic Acid 200 MG CAPS, Take 600 mg by mouth 2 (two) times daily.  .  Ascorbic Acid (VITAMIN C) 1000 MG tablet, Take 2,000-3,000 mg by mouth 2 (two) times daily. Takes 2000mg  in the morning and 3000mg  at night .  calcium carbonate (OS-CAL) 600 MG TABS tablet, Take 800 mg by mouth 2 (two) times daily with a meal. .  clonazePAM (KLONOPIN) 1 MG tablet, Take 1 tablet (1 mg total) by mouth at bedtime. .  Coenzyme Q10 200 MG capsule, Take 200 mg by mouth daily. .  cyclobenzaprine (FLEXERIL) 10 MG tablet, TAKE 1 TABLET BY MOUTH THREE TIMES A DAY AS NEEDED FOR MUSCLE SPASMS .  cycloSPORINE (RESTASIS) 0.05 % ophthalmic emulsion, Place 1 drop into both eyes 2 (two) times daily. .   Flaxseed, Linseed, (FLAXSEED OIL) 1000 MG CAPS, Take 1,000 mg by mouth daily.  Marland Kitchen  MAGNESIUM LACTATE PO, Take 2 tablets by mouth at bedtime. .  niacin 500 MG tablet, Take 500 mg by mouth daily.  .  ondansetron (ZOFRAN ODT) 4 MG disintegrating tablet, Take 1 tablet (4 mg total) by mouth every 8 (eight) hours as needed for nausea or vomiting. Marland Kitchen  OVER THE COUNTER MEDICATION, Take 1-2 tablets by mouth 2 (two) times daily. *Nutrient 950 with NAC2* takes 2 tablets in the morning and 1 tablet at lunchtime .  OVER THE COUNTER MEDICATION, Take 2  tablets by mouth daily with breakfast. *LV GB Complex* .  OVER THE COUNTER MEDICATION, Take 1 tablet by mouth 2 (two) times daily. *Regenemax or Biosil* .  OVER THE COUNTER MEDICATION, Take 30 mg by mouth daily. *Borotab* .  STRONTIUM GLUCONATE-B6-B12-FA PO, Take 2 tablets by mouth daily.  Marland Kitchen  triamcinolone cream (KENALOG) 0.1 %, Apply 1 application topically 2 (two) times daily. .  valACYclovir (VALTREX) 1000 MG tablet, TAKE 2 TABLETS EVERY MORNING AND TAKE 2 TABLETS EVERY EVENING FOR 1 DAY AS NEEDED FOR FLARE .  Vitamin D, Ergocalciferol, (DRISDOL) 50000 units CAPS capsule, Take 50,000 Units by mouth every Sunday. Marland Kitchen  VITAMIN K, PHYTONADIONE, PO, Take 2 tablets by mouth daily.  Marland Kitchen  zinc gluconate 50 MG tablet, Take 50-100 mg by mouth 2 (two) times daily. Takes 100mg  in the morning and 50mg  later in the day   Reviewed prior external information including notes and imaging from  primary care provider As well as notes that were available from care everywhere and other healthcare systems.  Past medical history, social, surgical and family history all reviewed in electronic medical record.  [name] pertanent information unless stated regarding to the chief complaint.   Review of Systems:  [name] headache, visual changes, nausea, vomiting, diarrhea, constipation, dizziness, abdominal pain, skin rash, fevers, chills, night sweats, weight loss, swollen lymph nodes, , joint  swelling, chest pain, shortness of breath, mood changes. POSITIVE muscle aches, body aches  Objective  Blood pressure 130/80, pulse 87, height 5\' 3"  (1.6 m), weight 117 lb (53.1 kg).   General: [name] apparent distress alert and oriented x3 mood and affect normal, dressed appropriately.  HEENT: Pupils equal, extraocular movements intact  Respiratory: Patient's speak in full sentences and does not appear short of breath  Cardiovascular: [name] lower extremity edema, non tender, [name] erythema  Patient has severely antalgic gait.  Right leg is externally rotated.  Patient does have significant weakness with positive Trendelenburg MSK: Patient's right hip does have decrease in internal range of motion.  Patient does have good strength with flexion of the hip with seated position.  Negative straight leg test at this time but actually when putting the leg down starts to have some burning sensation in the quadricep patient states.  Neurovascularly intact distally.  Low back exam mild pain on the right side near the sacroiliac joint in the paraspinal musculature from L2-L4.  [name] spinous process tenderness.    Impression and Recommendations:     The above documentation has been reviewed and is accurate and complete Lyndal Pulley, DO

## 2020-04-14 ENCOUNTER — Encounter: Payer: Self-pay | Admitting: Family Medicine

## 2020-04-14 ENCOUNTER — Other Ambulatory Visit: Payer: Self-pay

## 2020-04-14 ENCOUNTER — Encounter: Payer: Self-pay | Admitting: Neurology

## 2020-04-14 ENCOUNTER — Ambulatory Visit: Payer: PPO | Admitting: Family Medicine

## 2020-04-14 VITALS — BP 130/80 | HR 87 | Ht 63.0 in | Wt 117.0 lb

## 2020-04-14 DIAGNOSIS — M79604 Pain in right leg: Secondary | ICD-10-CM | POA: Diagnosis not present

## 2020-04-14 DIAGNOSIS — S3421XD Injury of nerve root of lumbar spine, subsequent encounter: Secondary | ICD-10-CM | POA: Diagnosis not present

## 2020-04-14 DIAGNOSIS — S73191A Other sprain of right hip, initial encounter: Secondary | ICD-10-CM

## 2020-04-14 DIAGNOSIS — M1611 Unilateral primary osteoarthritis, right hip: Secondary | ICD-10-CM | POA: Diagnosis not present

## 2020-04-14 DIAGNOSIS — R29898 Other symptoms and signs involving the musculoskeletal system: Secondary | ICD-10-CM

## 2020-04-14 NOTE — Patient Instructions (Addendum)
PT will call you Continue being active as you can Neurology will be calling you See Dr. Georgina Snell or Dr. Tamala Julian in 6-8 weeks

## 2020-04-14 NOTE — Assessment & Plan Note (Signed)
Patient has known moderate arthritic changes of joint hip.  Patient continues to have trouble and does have weakness.  Walks with an externally rotated leg.  Could be secondary to weakness in the L3 nerve root.  Difficult to assess.  Due to the significant weakness I do want to do a nerve conduction test to see if that would further evaluate either the back or the hip is having more the weakness.  Patient wants to start physical therapy again.  I think at this point that is fine but if this continues I would like her to be further evaluated by surgery to discuss knee replacement of the joint or her back.

## 2020-04-14 NOTE — Assessment & Plan Note (Signed)
Patient has had this for quite some time.  Nerve conduction study and further to evaluate the health of the nerve at this time.  Patient has had the gluteal tendon tear previously as well which does give weakness on the side.

## 2020-04-15 ENCOUNTER — Encounter (HOSPITAL_BASED_OUTPATIENT_CLINIC_OR_DEPARTMENT_OTHER): Payer: Self-pay

## 2020-04-15 ENCOUNTER — Other Ambulatory Visit: Payer: Self-pay

## 2020-04-15 ENCOUNTER — Emergency Department (HOSPITAL_BASED_OUTPATIENT_CLINIC_OR_DEPARTMENT_OTHER)
Admission: EM | Admit: 2020-04-15 | Discharge: 2020-04-15 | Disposition: A | Discharge status: Home / Self Care | Payer: PPO | Attending: Emergency Medicine | Admitting: Emergency Medicine

## 2020-04-15 DIAGNOSIS — Z853 Personal history of malignant neoplasm of breast: Secondary | ICD-10-CM | POA: Diagnosis not present

## 2020-04-15 DIAGNOSIS — Z87891 Personal history of nicotine dependence: Secondary | ICD-10-CM | POA: Insufficient documentation

## 2020-04-15 DIAGNOSIS — Z79899 Other long term (current) drug therapy: Secondary | ICD-10-CM | POA: Insufficient documentation

## 2020-04-15 DIAGNOSIS — E039 Hypothyroidism, unspecified: Secondary | ICD-10-CM | POA: Diagnosis not present

## 2020-04-15 DIAGNOSIS — R42 Dizziness and giddiness: Secondary | ICD-10-CM

## 2020-04-15 DIAGNOSIS — R9431 Abnormal electrocardiogram [ECG] [EKG]: Secondary | ICD-10-CM | POA: Diagnosis not present

## 2020-04-15 LAB — BASIC METABOLIC PANEL
Anion gap: 16 — ABNORMAL HIGH (ref 5–15)
BUN: 15 mg/dL (ref 8–23)
CO2: 23 mmol/L (ref 22–32)
Calcium: 9.2 mg/dL (ref 8.9–10.3)
Chloride: 99 mmol/L (ref 98–111)
Creatinine, Ser: 0.66 mg/dL (ref 0.44–1.00)
GFR, Estimated: >60 mL/min (ref >60)
Glucose, Bld: 103 mg/dL — ABNORMAL HIGH (ref 70–99)
Potassium: 4.1 mmol/L (ref 3.5–5.1)
Sodium: 138 mmol/L (ref 135–145)

## 2020-04-15 LAB — CBG MONITORING, ED: Glucose-Capillary: 98 mg/dL (ref 70–99)

## 2020-04-15 LAB — URINALYSIS, ROUTINE W REFLEX MICROSCOPIC
Bilirubin Urine: NEGATIVE
Glucose, UA: NEGATIVE mg/dL
Hgb urine dipstick: NEGATIVE
Ketones, ur: NEGATIVE mg/dL
Leukocytes,Ua: NEGATIVE
Nitrite: NEGATIVE
Protein, ur: NEGATIVE mg/dL
Specific Gravity, Urine: 1.01 (ref 1.005–1.030)
pH: 6 (ref 5.0–8.0)

## 2020-04-15 LAB — CBC
HCT: 36.3 % (ref 36.0–46.0)
Hemoglobin: 12 g/dL (ref 12.0–15.0)
MCH: 31.7 pg (ref 26.0–34.0)
MCHC: 33.1 g/dL (ref 30.0–36.0)
MCV: 96 fL (ref 80.0–100.0)
Platelets: 260 10*3/uL (ref 150–400)
RBC: 3.78 MIL/uL — ABNORMAL LOW (ref 3.87–5.11)
RDW: 14.3 % (ref 11.5–15.5)
WBC: 13 10*3/uL — ABNORMAL HIGH (ref 4.0–10.5)
nRBC: 0 % (ref 0.0–0.2)

## 2020-04-15 MED ORDER — SODIUM CHLORIDE 0.9 % IV BOLUS
500.0000 mL | Freq: Once | INTRAVENOUS | Status: AC
  Administered 2020-04-15: 500 mL via INTRAVENOUS

## 2020-04-15 NOTE — ED Triage Notes (Signed)
Pt c/o intermittent dizziness x 1 week-states that sx started after receiving "a spinal injection"-states she has not sought medical tx for c/o-NAD-limping gait that she reports as her baseline

## 2020-04-15 NOTE — ED Provider Notes (Signed)
[facility] EMERGENCY DEPARTMENT Provider Note   CSN: [id number] Arrival date & time: 04/15/20  1235     History Chief Complaint  Patient presents with  . Dizziness    Christina [name] is a 69 y.o. female.  Presents to ER with concern for dizziness.  Patient states that she has had no room spinning sensation, describes her dizziness as a lightheadedness.  Symptoms ongoing for the past week or so.  States she had spinal injection couple days before this started.  She has not had any associated numbness, weakness, vision changes, speech changes.  Reports that she has some gait and chronic weakness that is unchanged.  No ear pain or ear infection.  No fever, no cough or difficulty in breathing.  No episodes of passing out.  No chest pain or back pain. HPI     Past Medical History:  Diagnosis Date  . Abrasion of right leg 12/17/2014  . Adrenal insufficiency ([place])   . Breast cancer of upper-outer quadrant of left female breast ([place]) 12/07/2014  . Cataract, immature    bilateral  . Dental bridge present    upper - x 2  . Dental crowns present   . Hypothyroidism   . Personal history of radiation therapy   . Prediabetes     Patient Active Problem List   Diagnosis Date Noted  . Arthritis of right hip 09/17/2019  . Labral tear of right hip joint 08/06/2019  . Gluteal tendonitis of right buttock 05/22/2019  . Tear of left gluteus medius tendon 01/04/2019  . Arthritis of left hip 03/20/2018  . Greater trochanteric bursitis of right hip 06/13/2017  . 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 Breast cancer of upper-outer quadrant of left female breast ([place]) 12/07/2014  . Acute recurrent sinusitis 01/17/2013    Past Surgical History:  Procedure Laterality Date  . ABDOMINOPLASTY    . BALLOON SINUPLASTY    . BREAST LUMPECTOMY Left 2016  . BREAST LUMPECTOMY WITH RADIOACTIVE SEED AND SENTINEL LYMPH NODE BIOPSY Left 12/22/2014   Procedure: BREAST LUMPECTOMY WITH  RADIOACTIVE SEED AND SENTINEL LYMPH NODE BIOPSY;  Surgeon: Stark Klein, MD;  Location: [facility];  Service: General;  Laterality: Left;  . BUNIONECTOMY Right   . HAMMER TOE SURGERY Bilateral   . RHINOPLASTY    . SEPTOPLASTY       OB History   No obstetric history on file.     Family History  Problem Relation Age of Onset  . Diabetes Mother   . Diabetes Father   . Cancer Maternal Uncle        pancreatic cancer   . Diabetes Paternal Grandmother   . Diabetes Paternal Grandfather   . Breast cancer Sister     Social History   Tobacco Use  . Smoking status: Former Smoker    Packs/day: 0.00    Years: 0.00    Pack years: 0.00    Quit date: 05/01/1980    Years since quitting: 39.9  . Smokeless tobacco: Never Used  Vaping Use  . Vaping Use: Never used  Substance Use Topics  . Alcohol use: Yes    Alcohol/week: 0.0 standard drinks    Comment: occasionally  . Drug use: No    Home Medications Prior to Admission medications   Medication Sig Start Date End Date Taking? Authorizing Provider  Alpha Lipoic Acid 200 MG CAPS Take 600 mg by mouth 2 (two) times daily.    Yes  [provider]  Ascorbic Acid (VITAMIN C) 1000 MG tablet Take 2,000-3,000 mg by mouth 2 (two) times daily. Takes 2000mg  in the morning and 3000mg  at night   Yes [provider]  cetirizine (ZYRTEC) 10 MG tablet Take 10 mg by mouth daily.    Yes [provider]  clonazePAM (KLONOPIN) 1 MG tablet Take 1 tablet (1 mg total) by mouth at bedtime. 03/01/20  Yes Marrian Salvage, FNP  Coenzyme Q10 200 MG capsule Take 200 mg by mouth daily.   Yes [provider]  cyclobenzaprine (FLEXERIL) 10 MG tablet TAKE 1 TABLET BY MOUTH THREE TIMES A DAY AS NEEDED FOR MUSCLE SPASMS 04/01/20  Yes Marrian Salvage, FNP  cycloSPORINE (RESTASIS) 0.05 % ophthalmic emulsion Place 1 drop into both eyes 2 (two) times daily. 05/22/18  Yes [provider]  Flaxseed, Linseed,  (FLAXSEED OIL) 1000 MG CAPS Take 1,000 mg by mouth daily.    Yes [provider]  fluticasone (FLONASE) 50 MCG/ACT nasal spray Place 1 spray into both nostrils daily. 02/23/15  Yes [provider]  guaiFENesin (MUCINEX) 600 MG 12 hr tablet Take 600 mg by mouth daily.    Yes [provider]  ibuprofen (ADVIL,MOTRIN) 200 MG tablet Take 600 mg by mouth every 4 (four) hours as needed for fever, headache, mild pain, moderate pain or cramping.   Yes [provider]  liothyronine (CYTOMEL) 25 MCG tablet Take 75 mcg by mouth daily. 2 in the am; 1 at noon 02/03/15  Yes [provider]  MAGNESIUM LACTATE PO Take 2 tablets by mouth at bedtime.   Yes [provider]  metFORMIN (GLUCOPHAGE-XR) 500 MG 24 hr tablet Take 1,000 mg by mouth 2 (two) times daily.  02/08/15  Yes [provider]  montelukast (SINGULAIR) 10 MG tablet Take 1 tablet (10 mg total) by mouth at bedtime. 03/01/20  Yes Marrian Salvage, FNP  niacin 500 MG tablet Take 500 mg by mouth daily.    Yes [provider]  OVER THE COUNTER MEDICATION Take 1-2 tablets by mouth 2 (two) times daily. *Nutrient 950 with NAC2* takes 2 tablets in the morning and 1 tablet at lunchtime   Yes [provider]  OVER THE COUNTER MEDICATION Take 2 tablets by mouth daily with breakfast. *LV GB Complex*   Yes [provider]  OVER THE COUNTER MEDICATION Take 1 tablet by mouth 2 (two) times daily. *Regenemax or Biosil*   Yes [provider]  OVER THE COUNTER MEDICATION Take 30 mg by mouth daily. *Borotab*   Yes [provider]  STRONTIUM GLUCONATE-B6-B12-FA PO Take 2 tablets by mouth daily.    Yes [provider]  valACYclovir (VALTREX) 1000 MG tablet TAKE 2 TABLETS EVERY MORNING AND TAKE 2 TABLETS EVERY EVENING FOR 1 DAY AS NEEDED FOR FLARE 03/12/19  Yes [provider]  Vitamin D, Ergocalciferol, (DRISDOL) 50000 units CAPS capsule Take 50,000  Units by mouth every Sunday.   Yes [provider]  VITAMIN K, PHYTONADIONE, PO Take 2 tablets by mouth daily.    Yes [provider]  zinc gluconate 50 MG tablet Take 50-100 mg by mouth 2 (two) times daily. Takes 100mg  in the morning and 50mg  later in the day   Yes [provider]  albuterol (PROVENTIL HFA;VENTOLIN HFA) 108 (90 BASE) MCG/ACT inhaler Inhale into the lungs every 6 (six) hours as needed for wheezing or shortness of breath.    [provider]  calcium carbonate (  OS-CAL) 600 MG TABS tablet Take 800 mg by mouth 2 (two) times daily with a meal.    [provider]  methylPREDNISolone (MEDROL) 4 MG tablet Take 4 mg by mouth 3 (three) times daily. Takes 4mg  in the morning     [provider]  ondansetron (ZOFRAN ODT) 4 MG disintegrating tablet Take 1 tablet (4 mg total) by mouth every 8 (eight) hours as needed for nausea or vomiting. 03/22/18   Nuala Alpha A, PA-C  triamcinolone cream (KENALOG) 0.1 % Apply 1 application topically 2 (two) times daily. 11/18/19   Gregor Hams, MD    Allergies    Celery oil, Doxycycline, Rye grass flower pollen extract [gramineae pollens], Sulfa antibiotics, Tea, and Adhesive [tape]  Review of Systems   Review of Systems  Constitutional: Negative for chills and fever.  HENT: Negative for ear pain and sore throat.   Eyes: Negative for pain and visual disturbance.  Respiratory: Negative for cough and shortness of breath.   Cardiovascular: Negative for chest pain and palpitations.  Gastrointestinal: Negative for abdominal pain and vomiting.  Genitourinary: Negative for dysuria and hematuria.  Musculoskeletal: Negative for arthralgias and back pain.  Skin: Negative for color change and rash.  Neurological: Positive for light-headedness. Negative for seizures and syncope.  All other systems reviewed and are negative.   Physical Exam Updated Vital Signs BP (!) 152/76 (BP Location: Left Arm)    Pulse 94   Temp 98.4 F (36.9 C) (Oral)   Resp 18   Ht 5\' 3"  (1.6 m)   Wt 53.5 kg   SpO2 97%   BMI 20.90 kg/m   Physical Exam Vitals and nursing note reviewed.  Constitutional:      General: She is not in acute distress.    Appearance: She is well-developed and well-nourished.  HENT:     Head: Normocephalic and atraumatic.  Eyes:     Conjunctiva/sclera: Conjunctivae normal.  Cardiovascular:     Rate and Rhythm: Normal rate and regular rhythm.     Heart sounds: No murmur heard.   Pulmonary:     Effort: Pulmonary effort is normal. No respiratory distress.     Breath sounds: Normal breath sounds.  Abdominal:     Palpations: Abdomen is soft.     Tenderness: There is no abdominal tenderness.  Musculoskeletal:        General: No edema.     Cervical back: Neck supple.  Skin:    General: Skin is warm and dry.  Neurological:     Mental Status: She is alert.     Comments: AAOx3 CN 2-12 intact, speech clear visual fields intact 5/5 strength in b/l UE and LE Sensation to light touch intact in b/l UE and LE Normal FNF Normal gait  Psychiatric:        Mood and Affect: Mood and affect and mood normal.        Behavior: Behavior normal.     ED Results / Procedures / Treatments   Labs (all labs ordered are listed, but only abnormal results are displayed) Labs Reviewed  BASIC METABOLIC PANEL - Abnormal; Notable for the following components:      Result Value   Glucose, Bld 103 (*)    Anion gap 16 (*)    All other components within normal limits  CBC - Abnormal; Notable for the following components:   WBC 13.0 (*)    RBC 3.78 (*)    All other components within normal limits  URINALYSIS,  ROUTINE W REFLEX MICROSCOPIC  CBG MONITORING, ED    EKG EKG Interpretation  Date/Time:  Thursday April 15 2020 12:49:14 EST Ventricular Rate:  98 PR Interval:  130 QRS Duration: 72 QT Interval:  334 QTC Calculation: 426 R Axis:   48 Text Interpretation: Normal sinus rhythm  Nonspecific ST and T wave abnormality Abnormal ECG Confirmed by Madalyn Rob [phone]) on 04/15/2020 3:06:35 PM   Radiology No results found.  Procedures Procedures (including critical care time)  Medications Ordered in ED Medications  sodium chloride 0.9 % bolus 500 mL (0 mLs Intravenous Stopped 04/15/20 1615)    ED Course  I have reviewed the triage vital signs and the nursing notes.  Pertinent labs & imaging results that were available during my care of the patient were reviewed by me and considered in my medical decision making (see chart for details).    MDM Rules/Calculators/A&P                         69 year old lady presents to ER with concern for lightheadedness.  On physical exam, patient noted to be remarkably well-appearing with no acute distress.  Vital signs stable.  Her basic labs were grossly within normal limits.  EKG with some nonspecific ST and T wave changes but no acute change from prior.  No associated chest pain.  Provided some IV fluids, her symptoms improved.  Given the reassuring work-up and reassuring exam, believe patient can be discharged and managed in the outpatient setting.  Recommend she follow-up with her primary doctor, return for any worsening or new concerning symptoms.   After the discussed management above, the patient was determined to be safe for discharge.  The patient was in agreement with this plan and all questions regarding their care were answered.  ED return precautions were discussed and the patient will return to the ED with any significant worsening of condition.  Final Clinical Impression(s) / ED Diagnoses Final diagnoses:  Lightheadedness    Rx / DC Orders ED Discharge Orders    None       Lucrezia Starch, MD 04/15/20 1627

## 2020-04-15 NOTE — Discharge Instructions (Addendum)
Please schedule follow-up appointment with your primary doctor to discuss your experiencing today.  Return to ER if you have any chest pain, difficulty ambulating, weakness, room spinning sensation for any episodes of passing out.

## 2020-04-19 ENCOUNTER — Encounter: Payer: PPO | Admitting: Physical Therapy

## 2020-04-28 ENCOUNTER — Encounter: Payer: PPO | Admitting: Physical Therapy

## 2020-05-04 ENCOUNTER — Other Ambulatory Visit: Payer: Self-pay | Admitting: Family

## 2020-05-11 ENCOUNTER — Encounter: Payer: Self-pay | Admitting: Physical Therapy

## 2020-05-11 ENCOUNTER — Other Ambulatory Visit: Payer: Self-pay

## 2020-05-11 ENCOUNTER — Ambulatory Visit: Payer: PPO | Attending: Family Medicine | Admitting: Physical Therapy

## 2020-05-11 DIAGNOSIS — M79604 Pain in right leg: Secondary | ICD-10-CM | POA: Insufficient documentation

## 2020-05-11 DIAGNOSIS — M62838 Other muscle spasm: Secondary | ICD-10-CM | POA: Diagnosis not present

## 2020-05-11 DIAGNOSIS — R2689 Other abnormalities of gait and mobility: Secondary | ICD-10-CM | POA: Diagnosis not present

## 2020-05-11 DIAGNOSIS — M6281 Muscle weakness (generalized): Secondary | ICD-10-CM | POA: Diagnosis not present

## 2020-05-11 DIAGNOSIS — M25551 Pain in right hip: Secondary | ICD-10-CM | POA: Diagnosis not present

## 2020-05-11 NOTE — Patient Instructions (Signed)
Access Code: [email or web address] URL: [email or web address] Date: 05/11/2020 Prepared by: Venetia Night Marvelous Bouwens  Exercises Standing Quadriceps Stretch - 1 x daily - 7 x weekly - 1 sets - 2 reps - 30 hold Standing Hip Flexion with Counter Support - 1 x daily - 7 x weekly - 3 sets - 5 reps Standing March with Counter Support - 1 x daily - 7 x weekly - 3 sets - 5 reps Seated March - 1 x daily - 7 x weekly - 3 sets - 5 reps Supine Hip Internal and External Rotation - 1 x daily - 7 x weekly - 1 sets - 20 reps

## 2020-05-11 NOTE — Therapy (Signed)
[facility] [address], [place], [place], [address] Phone: [phone]   Fax:  [phone]  Physical Therapy Evaluation  Patient Details  Name: Christina [name] MRN: [id number] Date of Birth: [birth year] Referring Provider (PT): Lyndal Pulley, DO   Encounter Date: 05/11/2020   PT End of Session - 05/11/20 1239    Visit Number 1    Date for PT Re-Evaluation 07/06/20    Authorization Type [employer]    Progress Note Due on Visit 10    PT Start Time 6237    PT Stop Time 1318    PT Time Calculation (min) 43 min    Activity Tolerance Patient tolerated treatment well    Behavior During Therapy [facility] for tasks assessed/performed           Past Medical History:  Diagnosis Date  . Abrasion of right leg 12/17/2014  . Adrenal insufficiency ([place])   . Breast cancer of upper-outer quadrant of left female breast ([place]) 12/07/2014  . Cataract, immature    bilateral  . Dental bridge present    upper - x 2  . Dental crowns present   . Hypothyroidism   . Personal history of radiation therapy   . Prediabetes     Past Surgical History:  Procedure Laterality Date  . ABDOMINOPLASTY    . BALLOON SINUPLASTY    . BREAST LUMPECTOMY Left 2016  . BREAST LUMPECTOMY WITH RADIOACTIVE SEED AND SENTINEL LYMPH NODE BIOPSY Left 12/22/2014   Procedure: BREAST LUMPECTOMY WITH RADIOACTIVE SEED AND SENTINEL LYMPH NODE BIOPSY;  Surgeon: Stark Klein, MD;  Location: [place];  Service: General;  Laterality: Left;  . BUNIONECTOMY Right   . HAMMER TOE SURGERY Bilateral   . RHINOPLASTY    . SEPTOPLASTY      There were no vitals filed for this visit.    Subjective Assessment - 05/11/20 1234    Subjective Pt is a previous patient with ongoing Rt LE weakness which signif affects gait and balance.  She has had multiple lumbar injections which seem to give partial relief for improved coordination and strength but this  does not last.  MD has ordered EMG which will be on 05/26/20.    Pertinent History EMG scheduled for 05/26/20    How long can you walk comfortably? cannot walk comfortably    Diagnostic tests MRI Rt hip 08/19/19: mod degen, spurring, superior labrum degen/torn, MRI lumbar 2018 multi-level disc dessication and lateral stenosis, right foraminal disc extrusion L3/4 with right L3 nerve root impingement    Patient Stated Goals be able to walk    Currently in Pain? No/denies    Pain Orientation Right    Pain Descriptors / Indicators Tiring;Other (Comment)   weak   Pain Type Chronic pain;Other (Comment)   chronic weakness vs pain   Pain Onset More than a month ago    Pain Frequency Constant    Aggravating Factors  walking, standing (weakness vs pain)              [facility] PT Assessment - 05/11/20 0001      Assessment   Medical Diagnosis R29.898 (ICD-10-CM) - Right leg weakness M16.11 (ICD-10-CM) - Arthritis of right hip S73.191A (ICD-10-CM) - Tear of right acetabular labrum, initial encounter    Referring Provider (PT) Lyndal Pulley, DO    Onset Date/Surgical Date --   1 year ago for progression of weakness   Next MD Visit 05/26/20   EMG  Prior Therapy yes at this facility for same diagnoses      Precautions   Precautions Fall    Precaution Comments due to weakness, no recent history of falls      Restrictions   Weight Bearing Restrictions No      Balance Screen   Has the patient fallen in the past 6 months No    Has the patient had a decrease in activity level because of a fear of falling?  Yes    Is the patient reluctant to leave their home because of a fear of falling?  Yes      [place] Private residence    Living Arrangements Alone    Type of [place] Access Level entry    Home Layout One level    [place] - single point      Prior Function   Level of [place] Retired    Leisure has horses  but can't ride right now      Charity fundraiser Status Within Functional Limits for tasks assessed      ROM / Strength   AROM / PROM / Strength AROM;PROM;Strength      PROM   Overall PROM Comments capsular pattern of Rt hip present, Lt hip WNL ROM    PROM Assessment Site Hip    Right/Left Hip Right    Right Hip Flexion 110    Right Hip External Rotation  65    Right Hip Internal Rotation  12    Right Hip ABduction 50      Strength   Overall Strength Comments fatiguable weakness Rt hip flexion, other bil LE strength 4/5 throughout    Strength Assessment Site Hip;Knee    Right/Left Hip Right    Right Hip Flexion 3+/5    Right/Left Knee Right;Left    Right Knee Extension 4/5    Left Knee Extension 4+/5      Flexibility   Soft Tissue Assessment /Muscle Length yes    Hamstrings limited 20% bil    Quadriceps limited 30% on Rt      Special Tests    Special Tests Hip Special Tests    Hip Special Tests  Hip Scouring      Hip Scouring   Findings Positive    Side Right    Comments pain in FADIR      Ambulation/Gait   Ambulation/Gait Yes    Ambulation/Gait Assistance 6: Modified independent (Device/Increase time)    Assistive device Straight cane   and without cane   Gait Pattern Step-to pattern;Step-through pattern    Gait Comments Pt can perform advancement of Rt LE and heel strike x approx 10-15 feet before fatigue and shuffle of Rt LE secondary to hip flexor weakness      Standardized Balance Assessment   Standardized Balance Assessment Timed Up and Go Test;Five Times Sit to Stand    Five times sit to stand comments  14 sec signif fatigue last 2 reps with use of thighs on table for assist      Timed Up and Go Test   TUG Normal TUG    Normal TUG (seconds) 14    TUG Comments drags Rt foot, multi-pivot turn on Lt foot                      Objective measurements completed on examination: See above findings.  PT Education  - 05/11/20 1316    Education Details Access Code: [email or web address]    Person(s) Educated Patient    Methods Explanation;Handout;Demonstration    Comprehension Verbalized understanding;Returned demonstration            PT Short Term Goals - 05/11/20 1330      PT SHORT TERM GOAL #1   Title Pt will be ind with initial HEP    Baseline -    Time 4    Period Weeks    Status New    Target Date 06/08/20      PT SHORT TERM GOAL #2   Title Pt will be able to perform 10 reps of SLR on Rt LE to demo improved Rt hip flexor strength    Baseline -    Time 4    Period Weeks    Status New    Target Date 06/08/20      PT SHORT TERM GOAL #3   Title Pt will be able to perform step through gait with consistent heel strike on Rt LE x 40 feet    Baseline 10 feet then shuffles LE    Time 4    Period Weeks    Status New    Target Date 06/08/20             PT Long Term Goals - 05/11/20 1331      PT LONG TERM GOAL #1   Title Pt with achieve Rt hip flexor strength of at least 4/5 for improved daily task endurance and ambulation.    Time 8    Period Weeks    Status New    Target Date 07/06/20      PT LONG TERM GOAL #2   Title Pt will perform 5x sit to stand without compensation of thighs pushing back on table in 12 sec or less to demo reduced fall risk and improved transfers.    Baseline 14, using back of thighs on table last 2 reps    Time 8    Period Weeks    Status New    Target Date 07/06/20      PT LONG TERM GOAL #3   Title Pt will perform TUG in 12 sec or less with pivot turn without LOB to demo reduced fall risk    Baseline 14, multi-step turns    Time 8    Period Weeks    Status New    Target Date 07/06/20      PT LONG TERM GOAL #4   Title Pt will be able to demo gait symmetry in a 3 min walk test with min shuffling of Rt LE    Baseline -    Time 8    Period Weeks    Status New    Target Date 07/06/20      PT LONG TERM GOAL #5   Title Pt will be ind with advanced HEP     Time 8    Period Weeks    Status New    Target Date 07/06/20                  Plan - 05/11/20 1321    Clinical Impression Statement Pt is a returning Pt with ongoing gait dysfunction related to Rt hip weakness and pain.  Pt has history of these symptoms for quite some time and gains some improvement with lumbar injections but this doesn't last.  Strength has regressed since she was last here and  she states she is unable to perform HEP she was doing at d/c from last PT episode.  She has limited activity due to fear of falling, although she hasn't fallen at this time.  She presents with Rt LE drag with gait although is able to perform step through pattern with heel strike for approx 10 feet before fatigue of hip flexors.  Rt hip flexor strength is 3+/5 and quad is 4/5.  All other LE strength is 4/5 thoughout.  TUG and 5x sit to stand is 14 sec with signif fatigue for last 2 reps of 5x sit to stand, using back of thighs on treatment table for assistance.  She has + hip impingement on Rt, + Scour on Rt, + FADIR on Rt with capsular pattern of restriction.  She has signif length limitations of Rt quad with pain on stretching.  PT attempted gait training with cane today but Pt seemed less coordinated with this compared to without.  PT encouraged her to use it for steadiness only vs attempting to bear weight through it.  PT gave updated HEP today with focus on hip flexor strength and quad stretching.  Pt will benefit from skilled PT to address deficits and improve safety and symmetry with ambulation.    Personal Factors and Comorbidities Age;Time since onset of injury/illness/exacerbation    Examination-Activity Limitations Locomotion Level;Transfers;Stairs;Squat    Examination-Participation Restrictions Community Activity;Shop;Cleaning;Driving    Stability/Clinical Decision Making Stable/Uncomplicated    Clinical Decision Making Low    Rehab Potential Good    PT Frequency 1x / week    PT Duration 8  weeks    PT Treatment/Interventions ADLs/Self Care Home Management;Electrical Stimulation;Neuromuscular re-education;Therapeutic exercise;Functional mobility training;Stair training;Gait training;Therapeutic activities;Manual techniques;Patient/family education;Dry needling;Passive range of motion;Joint Manipulations;Spinal Manipulations    PT Next Visit Plan review HEP, Rt hip distraction/mobs/P/ROM, Rt quad STM and stretching, neuro re-ed and strength for Rt hip flexors and hip advancement in gait    PT Home Exercise Plan Access Code: [email or web address]    Recommended Other Services EMG 05/26/20    Consulted and Agree with Plan of Care Patient           Patient will benefit from skilled therapeutic intervention in order to improve the following deficits and impairments:  Abnormal gait,Decreased strength,Decreased endurance,Impaired flexibility,Postural dysfunction,Pain,Difficulty walking,Decreased range of motion  Visit Diagnosis: Muscle weakness (generalized) - Plan: PT plan of care cert/re-cert  Pain in right hip - Plan: PT plan of care cert/re-cert  Other abnormalities of gait and mobility - Plan: PT plan of care cert/re-cert     Problem List Patient Active Problem List   Diagnosis Date Noted  . Arthritis of right hip 09/17/2019  . Labral tear of right hip joint 08/06/2019  . Gluteal tendonitis of right buttock 05/22/2019  . Tear of left gluteus medius tendon 01/04/2019  . Arthritis of left hip 03/20/2018  . Greater trochanteric bursitis of right hip 06/13/2017  . 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 Breast cancer of upper-outer quadrant of left female breast ([place]) 12/07/2014  . Acute recurrent sinusitis 01/17/2013    Baruch Merl, PT 05/11/20 1:39 PM   [facility] [address], [place], [place], [address] Phone: [phone]   Fax:  [phone]  Name: Christina [name] MRN: [id number] Date of  Birth: 1950-05-06

## 2020-05-19 ENCOUNTER — Ambulatory Visit (INDEPENDENT_AMBULATORY_CARE_PROVIDER_SITE_OTHER): Payer: PPO | Admitting: Internal Medicine

## 2020-05-19 ENCOUNTER — Other Ambulatory Visit: Payer: Self-pay

## 2020-05-19 ENCOUNTER — Encounter: Payer: Self-pay | Admitting: Internal Medicine

## 2020-05-19 VITALS — BP 140/82 | HR 110 | Ht 63.0 in | Wt 116.2 lb

## 2020-05-19 DIAGNOSIS — E039 Hypothyroidism, unspecified: Secondary | ICD-10-CM | POA: Diagnosis not present

## 2020-05-19 DIAGNOSIS — E2740 Unspecified adrenocortical insufficiency: Secondary | ICD-10-CM

## 2020-05-19 LAB — BASIC METABOLIC PANEL
BUN: 15 mg/dL (ref 6–23)
CO2: 27 mEq/L (ref 19–32)
Calcium: 9.9 mg/dL (ref 8.4–10.5)
Chloride: 105 mEq/L (ref 96–112)
Creatinine, Ser: 0.73 mg/dL (ref 0.40–1.20)
GFR: 83.3 mL/min (ref >60.00)
Glucose, Bld: 111 mg/dL — ABNORMAL HIGH (ref 70–99)
Potassium: 4.1 mEq/L (ref 3.5–5.1)
Sodium: 141 mEq/L (ref 135–145)

## 2020-05-19 LAB — T4, FREE: Free T4: 0.09 ng/dL — ABNORMAL LOW (ref 0.60–1.60)

## 2020-05-19 LAB — TSH: TSH: 0.02 u[IU]/mL — ABNORMAL LOW (ref 0.35–4.50)

## 2020-05-19 LAB — CORTISOL: Cortisol, Plasma: 12.2 ug/dL

## 2020-05-19 MED ORDER — LIOTHYRONINE SODIUM 25 MCG PO TABS
25.0000 ug | ORAL_TABLET | Freq: Every day | ORAL | 1 refills | Status: DC
Start: 2020-05-19 — End: 2020-08-31

## 2020-05-19 NOTE — Progress Notes (Signed)
Name: Christina [name]  MRN/ DOB: [id number], 03-08-1951    Age/ Sex: 70 y.o., female    PCP: Marrian Salvage, [place]   Reason for Endocrinology Evaluation: Low TSH      Date of Initial Endocrinology Evaluation: 05/19/2020     HPI: Ms. Christina [name] is a 70 y.o. female with a past medical history of Breast ca ( S/P lumpectomy 2016) . The patient presented for initial endocrinology clinic visit on 05/19/2020 for consultative assistance with her low TSH     Ms. Stenner used to follow up with a holistic doctor Dr. Sharol Roussel for year. She was diagnosed with hypothyroidism through holistic medicine, she was initially put on Armour thyroid but when this didn;t work she was started on Synthroid  but that had to be stopped due to hair loss and was started on LT3 replacement which she has been on since at least 15 yrs.   She has been noted to have a low TSh during labs in 03/2020 at 0.02 uIU/mL . Per pt this was done on purpose  ?  Through Dr. Sharol Roussel . She could not tell me the purpose of the suppression.     She also tells me she has been diagnosed with adrenal insufficiency through Dr. Sharol Roussel, was started on hydrocortisone at first but this was subsequently switched to medrol per pt. She tells me she has been taking it for years and has not missed a dose.   She denies weight loss, she also denies palpitations  Denies diarrhea or loose stools.  Of note the pt sees ortho for hip OA and received inta-articular injections. Last one 11/2019     HOME ENDOCRINE MEDICATIONS: Liothyronine 25 mg 2 tabs daily  Medrol 4 mg, 1 tablet daily      She believes mother and sister with thyroid disease but no official diagnosis      HISTORY:  Past Medical History:  Past Medical History:  Diagnosis Date  . Abrasion of right leg 12/17/2014  . Adrenal insufficiency ([place])   . Breast cancer of upper-outer quadrant of left female breast ([place]) 12/07/2014  . Cataract, immature    bilateral  .  Dental bridge present    upper - x 2  . Dental crowns present   . Hypothyroidism   . Personal history of radiation therapy   . Prediabetes    Past Surgical History:  Past Surgical History:  Procedure Laterality Date  . ABDOMINOPLASTY    . BALLOON SINUPLASTY    . BREAST LUMPECTOMY Left 2016  . BREAST LUMPECTOMY WITH RADIOACTIVE SEED AND SENTINEL LYMPH NODE BIOPSY Left 12/22/2014   Procedure: BREAST LUMPECTOMY WITH RADIOACTIVE SEED AND SENTINEL LYMPH NODE BIOPSY;  Surgeon: Stark Klein, MD;  Location: [place];  Service: General;  Laterality: Left;  . BUNIONECTOMY Right   . HAMMER TOE SURGERY Bilateral   . RHINOPLASTY    . SEPTOPLASTY        Social History:  reports that she quit smoking about 40 years ago. She smoked 0.00 packs per day for 0.00 years. She has never used smokeless tobacco. She reports current alcohol use. She reports that she does not use drugs.  Family History: family history includes Breast cancer in her sister; Cancer in her maternal uncle; Diabetes in her father, mother, paternal grandfather, and paternal grandmother.   HOME MEDICATIONS: Allergies as of 05/19/2020      Reactions   Celery Oil Other (See Comments)   FEVER BLISTERS  Doxycycline Nausea And Vomiting   Rye Grass Flower Pollen Extract [gramineae Pollens] Swelling   RYE:  SWELLING THROAT - DENIES SOB   Sulfa Antibiotics Swelling   NOSE   Tea Swelling   THROAT - DENIES SOB   Adhesive [tape] Other (See Comments)   TEARS SKIN      Medication List       Accurate as of May 19, 2020  1:56 PM. If you have any questions, ask your nurse or doctor.        albuterol 108 (90 Base) MCG/ACT inhaler Commonly known as: VENTOLIN HFA Inhale into the lungs every 6 (six) hours as needed for wheezing or shortness of breath.   Alpha Lipoic Acid 200 MG Caps Take 600 mg by mouth 2 (two) times daily.   calcium carbonate 600 MG Tabs tablet Commonly known as: OS-CAL Take 800 mg by mouth  2 (two) times daily with a meal.   cetirizine 10 MG tablet Commonly known as: ZYRTEC Take 10 mg by mouth daily.   clonazePAM 1 MG tablet Commonly known as: KLONOPIN Take 1 tablet (1 mg total) by mouth at bedtime.   Coenzyme Q10 200 MG capsule Take 200 mg by mouth daily.   cyclobenzaprine 10 MG tablet Commonly known as: FLEXERIL TAKE 1 TABLET BY MOUTH THREE TIMES A DAY AS NEEDED FOR MUSCLE SPASMS   Flaxseed Oil 1000 MG Caps Take 1,000 mg by mouth daily.   fluticasone 50 MCG/ACT nasal spray Commonly known as: FLONASE Place 1 spray into both nostrils daily.   guaiFENesin 600 MG 12 hr tablet Commonly known as: MUCINEX Take 600 mg by mouth daily.   ibuprofen 200 MG tablet Commonly known as: ADVIL Take 600 mg by mouth every 4 (four) hours as needed for fever, headache, mild pain, moderate pain or cramping.   liothyronine 25 MCG tablet Commonly known as: CYTOMEL Take 75 mcg by mouth daily. 2 in the am; 1 at noon   MAGNESIUM LACTATE PO Take 2 tablets by mouth at bedtime.   metFORMIN 500 MG 24 hr tablet Commonly known as: GLUCOPHAGE-XR Take 1,000 mg by mouth 2 (two) times daily.   methylPREDNISolone 4 MG tablet Commonly known as: MEDROL Take 4 mg by mouth 3 (three) times daily. Takes 4mg  in the morning   montelukast 10 MG tablet Commonly known as: SINGULAIR Take 1 tablet (10 mg total) by mouth at bedtime.   niacin 500 MG tablet Take 500 mg by mouth daily.   ondansetron 4 MG disintegrating tablet Commonly known as: Zofran ODT Take 1 tablet (4 mg total) by mouth every 8 (eight) hours as needed for nausea or vomiting.   OVER THE COUNTER MEDICATION Take 1-2 tablets by mouth 2 (two) times daily. *Nutrient 950 with NAC2* takes 2 tablets in the morning and 1 tablet at lunchtime   OVER THE COUNTER MEDICATION Take 2 tablets by mouth daily with breakfast. *LV GB Complex*   OVER THE COUNTER MEDICATION Take 1 tablet by mouth 2 (two) times daily. *Regenemax or Biosil*    OVER THE COUNTER MEDICATION Take 30 mg by mouth daily. *Borotab*   Restasis 0.05 % ophthalmic emulsion Generic drug: cycloSPORINE Place 1 drop into both eyes 2 (two) times daily.   STRONTIUM GLUCONATE-B6-B12-FA PO Take 2 tablets by mouth daily.   triamcinolone 0.1 % Commonly known as: KENALOG Apply 1 application topically 2 (two) times daily.   valACYclovir 1000 MG tablet Commonly known as: VALTREX TAKE 2 TABLETS EVERY MORNING AND TAKE 2 TABLETS  EVERY EVENING FOR 1 DAY AS NEEDED FOR FLARE   vitamin C 1000 MG tablet Take 2,000-3,000 mg by mouth 2 (two) times daily. Takes 2000mg  in the morning and 3000mg  at night   Vitamin D (Ergocalciferol) 1.25 MG (50000 UNIT) Caps capsule Commonly known as: DRISDOL Take 50,000 Units by mouth every Sunday.   VITAMIN K (PHYTONADIONE) PO Take 2 tablets by mouth daily.   zinc gluconate 50 MG tablet Take 50-100 mg by mouth 2 (two) times daily. Takes 100mg  in the morning and 50mg  later in the day         REVIEW OF SYSTEMS: A comprehensive ROS was conducted with the patient and is negative except as per HPI     OBJECTIVE:  VS: BP 140/82   Pulse (!) 110   Ht 5\' 3"  (1.6 m)   Wt 116 lb 4 oz (52.7 kg)   SpO2 96%   BMI 20.59 kg/m    Wt Readings from Last 3 Encounters:  05/19/20 116 lb 4 oz (52.7 kg)  04/15/20 118 lb (53.5 kg)  04/14/20 117 lb (53.1 kg)     EXAM: General: Pt appears well and is in NAD  Neck: General: Supple without adenopathy. Thyroid: Thyroid size normal.  No goiter or nodules appreciated.   Lungs: Clear with good BS bilat with no rales, rhonchi, or wheezes  Heart: Auscultation: Tachycardic   Abdomen: Normoactive bowel sounds, soft, nontender, without masses or organomegaly palpable  Extremities:  BL LE: No pretibial edema normal ROM and strength.  Mental Status: Judgment, insight: Intact Orientation: Oriented to time, place, and person Mood and affect: No depression, anxiety, or agitation     DATA  REVIEWED:  Results for ALMYRA, KORAL" (MRN [id number]) as of 05/19/2020 16:36  Ref. Range 05/19/2020 13:59  Sodium Latest Ref Range: 135 - 145 mEq/L 141  Potassium Latest Ref Range: 3.5 - 5.1 mEq/L 4.1  Chloride Latest Ref Range: 96 - 112 mEq/L 105  CO2 Latest Ref Range: 19 - 32 mEq/L 27  Glucose Latest Ref Range: 70 - 99 mg/dL 111 (H)  BUN Latest Ref Range: 6 - 23 mg/dL 15  Creatinine Latest Ref Range: 0.40 - 1.20 mg/dL 0.73  Calcium Latest Ref Range: 8.4 - 10.5 mg/dL 9.9  GFR Latest Ref Range: >60.00 mL/min 83.30  Cortisol, Plasma Latest Units: ug/dL 12.2  TSH Latest Ref Range: 0.35 - 4.50 uIU/mL 0.02 (L)  T4,Free(Direct) Latest Ref Range: 0.60 - 1.60 ng/dL 0.09 (L)     ASSESSMENT/PLAN/RECOMMENDATIONS:   1. Hypothyroidism :  - Pt on LT-3 replacement only, and has been for decades. - Armour thyroid was not effective and synthroid  - We discussed risk of  Cardiac arrhythmia (atrial fibrillation) , and consequences of increased risk of CVA and CHF with over-replacement of thyroid hormone.  - I have advised her to reduce Liothyronine as below  - I have recommended switching to LT-4 replacement but will defer this until her TSh is elevated  - Confirmed pick up history from the pharmacy , has been prescribed by Modena Nunnery , MD  - TSH and FT4 continue to be low  Medications : Decrease Liothyronine 25 mcg to 1 tablet daily     2. Adrenal Insufficiency :  - I am not clear on the circumstances of her diagnosis, this has been decades ago , but knowing she came from a holistic doctor, most like she has been diagnosed with " adrenal fatigue" I explained to the pt that the Endocrine  Society does NOT believe in the existence of adrenal fatigue but at any point, if she has been on medrol for years , I suspect she has secondary adrenal insufficiency.  - Pt assures me compliance with daily 4 mg tabs of Medrol , but I am not sure where she is getting it from ?, as she stated she has  [facility] but [facility]  Medrol dating back to 2019.  - Pt understands that I will not be refilling Medrol and if she does indeed has secondary adrenal insufficiency, will switch it to hydrocortisone .  - I am going to wait on ACTH level before making a final determination, in the meantime she will reduce the dose as below  - Cortisol is normal   Medication Methylprednisone 4 mg, half a tablet daily     F/U in 3 months   Signed electronically by: Mack Guise, MD  [facility] End[facility]in[facility]  [facility] [address] [place], [place] [address] Phone: [phone] FAX: [phone]   CC: Marrian Salvage, [place] [address] Phone: [phone] Fax: [phone]   Return to Endocrinology clinic as below: Future Appointments  Date Time Provider [place]  05/20/2020 11:00 AM Danie Binder, PT [facility]  05/24/2020  2:45 PM Danie Binder, PT [facility]  05/26/2020 10:15 AM Narda Amber K, DO [facility] None  05/27/2020 10:30 AM Gregor Hams, MD [facility] None  06/01/2020 10:15 AM Danie Binder, PT [facility]  06/08/2020 10:15 AM Danie Binder, PT [facility]  06/15/2020 10:15 AM Danie Binder, PT [facility]  06/22/2020 10:15 AM Danie Binder, PT [facility]  08/18/2020 10:30 AM Arael Piccione, Melanie Crazier, MD [facility] None

## 2020-05-19 NOTE — Patient Instructions (Signed)
-   Please reduce Liothyronine ( Cytomel ) to 1 tablet daily in the morning  - Please reduce Medrol 4 mg,  To HALF a tablet with Breakfast until your results are back

## 2020-05-20 ENCOUNTER — Other Ambulatory Visit: Payer: Self-pay

## 2020-05-20 ENCOUNTER — Ambulatory Visit: Payer: PPO

## 2020-05-20 DIAGNOSIS — R2689 Other abnormalities of gait and mobility: Secondary | ICD-10-CM

## 2020-05-20 DIAGNOSIS — M79604 Pain in right leg: Secondary | ICD-10-CM

## 2020-05-20 DIAGNOSIS — M62838 Other muscle spasm: Secondary | ICD-10-CM

## 2020-05-20 DIAGNOSIS — M6281 Muscle weakness (generalized): Secondary | ICD-10-CM | POA: Diagnosis not present

## 2020-05-20 DIAGNOSIS — M25551 Pain in right hip: Secondary | ICD-10-CM

## 2020-05-20 NOTE — Therapy (Signed)
[facility] [address] [place], [place], [address] Phone: [phone]   Fax:  [phone]  Physical Therapy Treatment  Patient Details  Name: Christina [name] MRN: [id number] Date of Birth: 1951/03/12 Referring Provider (PT): Lyndal Pulley, DO   Encounter Date: 05/20/2020   PT End of Session - 05/20/20 1142    Visit Number 2    Date for PT Re-Evaluation 07/06/20    Authorization Type [employer]    Progress Note Due on Visit 10    PT Start Time 1101    PT Stop Time 1146    PT Time Calculation (min) 45 min    Activity Tolerance Patient tolerated treatment well    Behavior During Therapy [facility] for tasks assessed/performed           Past Medical History:  Diagnosis Date  . Abrasion of right leg 12/17/2014  . Adrenal insufficiency ([place])   . Breast cancer of upper-outer quadrant of left female breast ([place]) 12/07/2014  . Cataract, immature    bilateral  . Dental bridge present    upper - x 2  . Dental crowns present   . Hypothyroidism   . Personal history of radiation therapy   . Prediabetes     Past Surgical History:  Procedure Laterality Date  . ABDOMINOPLASTY    . BALLOON SINUPLASTY    . BREAST LUMPECTOMY Left 2016  . BREAST LUMPECTOMY WITH RADIOACTIVE SEED AND SENTINEL LYMPH NODE BIOPSY Left 12/22/2014   Procedure: BREAST LUMPECTOMY WITH RADIOACTIVE SEED AND SENTINEL LYMPH NODE BIOPSY;  Surgeon: Stark Klein, MD;  Location: [place];  Service: General;  Laterality: Left;  . BUNIONECTOMY Right   . HAMMER TOE SURGERY Bilateral   . RHINOPLASTY    . SEPTOPLASTY      There were no vitals filed for this visit.   Subjective Assessment - 05/20/20 1103    Subjective Nothing new going on.  I'm doing homework.    Pertinent History EMG scheduled for 05/26/20    Patient Stated Goals be able to walk    Currently in Pain? No/denies                              [facility] Adult PT Treatment/Exercise - 05/20/20 0001      Ambulation/Gait   Pre-Gait Activities Heel strike with Rt and step through on Lt      Knee/Hip Exercises: Stretches   Sports administrator Right;5 reps;20 seconds    Quad Stretch Limitations prone with strap      Knee/Hip Exercises: Aerobic   Nustep Level 2  x 10 minutes      Knee/Hip Exercises: Prone   Hamstring Curl 1 set;10 reps    Hamstring Curl Limitations PT guiding motion to keep hip neutral      Manual Therapy   Manual Therapy Manual Traction;Soft tissue mobilization    Manual therapy comments addaday to Rt quad- pt very sensitive so light pressure      Ankle Exercises: Seated   Toe Raise 20 reps    Other Seated Ankle Exercises rockerboard with hold into DF x 3 minutes      Ankle Exercises: Supine   Other Supine Ankle Exercises active DF x 15                    PT Short Term Goals - 05/11/20 1330      PT  SHORT TERM GOAL #1   Title Pt will be ind with initial HEP    Baseline -    Time 4    Period Weeks    Status New    Target Date 06/08/20      PT SHORT TERM GOAL #2   Title Pt will be able to perform 10 reps of SLR on Rt LE to demo improved Rt hip flexor strength    Baseline -    Time 4    Period Weeks    Status New    Target Date 06/08/20      PT SHORT TERM GOAL #3   Title Pt will be able to perform step through gait with consistent heel strike on Rt LE x 40 feet    Baseline 10 feet then shuffles LE    Time 4    Period Weeks    Status New    Target Date 06/08/20             PT Long Term Goals - 05/11/20 1331      PT LONG TERM GOAL #1   Title Pt with achieve Rt hip flexor strength of at least 4/5 for improved daily task endurance and ambulation.    Time 8    Period Weeks    Status New    Target Date 07/06/20      PT LONG TERM GOAL #2   Title Pt will perform 5x sit to stand without compensation of thighs pushing back on table in 12 sec or less to demo reduced fall risk and improved  transfers.    Baseline 14, using back of thighs on table last 2 reps    Time 8    Period Weeks    Status New    Target Date 07/06/20      PT LONG TERM GOAL #3   Title Pt will perform TUG in 12 sec or less with pivot turn without LOB to demo reduced fall risk    Baseline 14, multi-step turns    Time 8    Period Weeks    Status New    Target Date 07/06/20      PT LONG TERM GOAL #4   Title Pt will be able to demo gait symmetry in a 3 min walk test with min shuffling of Rt LE    Baseline -    Time 8    Period Weeks    Status New    Target Date 07/06/20      PT LONG TERM GOAL #5   Title Pt will be ind with advanced HEP    Time 8    Period Weeks    Status New    Target Date 07/06/20                 Plan - 05/20/20 1147    Clinical Impression Statement First time follow-up after evaluation.  Pt with ongoing gait dysfunction related to Rt hip weakness and pain.  Strength has regressed since she was last here and she states she is unable to perform HEP she was doing at d/c from last PT episode.  She has limited activity due to fear of falling, although she hasn't fallen at this time.   Rt quads are extremely tight with reduced tissue mobility and session focused on tissue mobility and stretching across this area.   Pt was challenged with quad stretch in standing so modified to prone with strap today.  Pt fatigued very  quickly with pre-gait activity of heel strike and step-though to work on this phase of gait.  Pt will continue to benefit from skilled PT to address Rt LE weakness and gait abnormality.    PT Frequency 1x / week    PT Duration 8 weeks    PT Treatment/Interventions ADLs/Self Care Home Management;Electrical Stimulation;Neuromuscular re-education;Therapeutic exercise;Functional mobility training;Stair training;Gait training;Therapeutic activities;Manual techniques;Patient/family education;Dry needling;Passive range of motion;Joint Manipulations;Spinal Manipulations    PT  Next Visit Plan work on heel strike phase of gait with hip advancement, active DF and hip flexion, quad flexibility and mobility, Nu Step    PT Home Exercise Plan Access Code: [email or web address]    Consulted and Agree with Plan of Care Patient           Patient will benefit from skilled therapeutic intervention in order to improve the following deficits and impairments:  Abnormal gait,Decreased strength,Decreased endurance,Impaired flexibility,Postural dysfunction,Pain,Difficulty walking,Decreased range of motion  Visit Diagnosis: Pain in right hip  Muscle weakness (generalized)  Other abnormalities of gait and mobility  Pain in right leg  Other muscle spasm     Problem List Patient Active Problem List   Diagnosis Date Noted  . Arthritis of right hip 09/17/2019  . Labral tear of right hip joint 08/06/2019  . Gluteal tendonitis of right buttock 05/22/2019  . Tear of left gluteus medius tendon 01/04/2019  . Arthritis of left hip 03/20/2018  . Greater trochanteric bursitis of right hip 06/13/2017  . 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 Breast cancer of upper-outer quadrant of left female breast ([place]) 12/07/2014  . Acute recurrent sinusitis 01/17/2013    Sigurd Sos, PT 05/20/20 11:49 AM  [facility] [address], [place], [place], [address] Phone: [phone]   Fax:  [phone]  Name: Christina [name] MRN: [id number] Date of Birth: October 24, 1950

## 2020-05-21 LAB — T3: T3, Total: 297 ng/dL — ABNORMAL HIGH (ref 76–181)

## 2020-05-21 LAB — ACTH: C206 ACTH: 18 pg/mL (ref 6–50)

## 2020-05-24 ENCOUNTER — Other Ambulatory Visit: Payer: Self-pay

## 2020-05-24 ENCOUNTER — Ambulatory Visit: Payer: PPO

## 2020-05-24 DIAGNOSIS — M6281 Muscle weakness (generalized): Secondary | ICD-10-CM | POA: Diagnosis not present

## 2020-05-24 DIAGNOSIS — M79604 Pain in right leg: Secondary | ICD-10-CM

## 2020-05-24 DIAGNOSIS — M25551 Pain in right hip: Secondary | ICD-10-CM

## 2020-05-24 DIAGNOSIS — R202 Paresthesia of skin: Secondary | ICD-10-CM

## 2020-05-24 DIAGNOSIS — R2689 Other abnormalities of gait and mobility: Secondary | ICD-10-CM

## 2020-05-24 NOTE — Therapy (Signed)
[facility] [address] [place], [place], [address] Phone: [phone]   Fax:  [phone]  Physical Therapy Treatment  Patient Details  Name: Christina [name] MRN: [id number] Date of Birth: 1950-11-04 Referring Provider (PT): Lyndal Pulley, DO   Encounter Date: 05/24/2020   PT End of Session - 05/24/20 1531    Visit Number 3    Date for PT Re-Evaluation 07/06/20    Authorization Type [employer]    Progress Note Due on Visit 10    PT Start Time 1017    PT Stop Time 1530    PT Time Calculation (min) 47 min    Activity Tolerance Patient tolerated treatment well    Behavior During Therapy [facility] for tasks assessed/performed           Past Medical History:  Diagnosis Date  . Abrasion of right leg 12/17/2014  . Adrenal insufficiency ([place])   . Breast cancer of upper-outer quadrant of left female breast ([place]) 12/07/2014  . Cataract, immature    bilateral  . Dental bridge present    upper - x 2  . Dental crowns present   . Hypothyroidism   . Personal history of radiation therapy   . Prediabetes     Past Surgical History:  Procedure Laterality Date  . ABDOMINOPLASTY    . BALLOON SINUPLASTY    . BREAST LUMPECTOMY Left 2016  . BREAST LUMPECTOMY WITH RADIOACTIVE SEED AND SENTINEL LYMPH NODE BIOPSY Left 12/22/2014   Procedure: BREAST LUMPECTOMY WITH RADIOACTIVE SEED AND SENTINEL LYMPH NODE BIOPSY;  Surgeon: Stark Klein, MD;  Location: [place];  Service: General;  Laterality: Left;  . BUNIONECTOMY Right   . HAMMER TOE SURGERY Bilateral   . RHINOPLASTY    . SEPTOPLASTY      There were no vitals filed for this visit.   Subjective Assessment - 05/24/20 1442    Subjective I am doing better today than last week.  I think it is because I havent had to walk on any ice.    Patient Stated Goals be able to walk    Currently in Pain? No/denies                              [facility] Adult PT Treatment/Exercise - 05/24/20 0001      Ambulation/Gait   Assistive device Straight cane    Pre-Gait Activities heel strike with step through on Rt    Gait Comments emphasis on initiation of hip flexion when in extension on the Rt- cane on the Lt.  Multiple passes along the countertop.      Knee/Hip Exercises: Stretches   Personnel officer Limitations prone with strap      Knee/Hip Exercises: Aerobic   Nustep Level 2  x 10 minutes      Knee/Hip Exercises: Standing   Other Standing Knee Exercises step tap on 4" step with Rt: emphasis on alignment      Knee/Hip Exercises: Seated   Marching Strengthening;Both;2 sets;10 reps    Marching Limitations holding 5# kettle bell on thigh      Knee/Hip Exercises: Prone   Hamstring Curl 1 set;10 reps    Hamstring Curl Limitations green band partial range x 10      Manual Therapy   Manual Therapy Manual Traction;Soft tissue mobilization    Manual therapy comments addaday to Rt quad-  pt very sensitive so light pressure      Ankle Exercises: Seated   Toe Raise 20 reps    Other Seated Ankle Exercises rockerboard with hold into DF x 3 minutes                    PT Short Term Goals - 05/11/20 1330      PT SHORT TERM GOAL #1   Title Pt will be ind with initial HEP    Baseline -    Time 4    Period Weeks    Status New    Target Date 06/08/20      PT SHORT TERM GOAL #2   Title Pt will be able to perform 10 reps of SLR on Rt LE to demo improved Rt hip flexor strength    Baseline -    Time 4    Period Weeks    Status New    Target Date 06/08/20      PT SHORT TERM GOAL #3   Title Pt will be able to perform step through gait with consistent heel strike on Rt LE x 40 feet    Baseline 10 feet then shuffles LE    Time 4    Period Weeks    Status New    Target Date 06/08/20             PT Long Term Goals - 05/11/20 1331      PT  LONG TERM GOAL #1   Title Pt with achieve Rt hip flexor strength of at least 4/5 for improved daily task endurance and ambulation.    Time 8    Period Weeks    Status New    Target Date 07/06/20      PT LONG TERM GOAL #2   Title Pt will perform 5x sit to stand without compensation of thighs pushing back on table in 12 sec or less to demo reduced fall risk and improved transfers.    Baseline 14, using back of thighs on table last 2 reps    Time 8    Period Weeks    Status New    Target Date 07/06/20      PT LONG TERM GOAL #3   Title Pt will perform TUG in 12 sec or less with pivot turn without LOB to demo reduced fall risk    Baseline 14, multi-step turns    Time 8    Period Weeks    Status New    Target Date 07/06/20      PT LONG TERM GOAL #4   Title Pt will be able to demo gait symmetry in a 3 min walk test with min shuffling of Rt LE    Baseline -    Time 8    Period Weeks    Status New    Target Date 07/06/20      PT LONG TERM GOAL #5   Title Pt will be ind with advanced HEP    Time 8    Period Weeks    Status New    Target Date 07/06/20                 Plan - 05/24/20 1518    Clinical Impression Statement Pt with ongoing gait dysfunction related to Rt hip weakness and pain.  Pt's quads have improved mobility today and pt tolerated Addaday much better today.  Session focused on Rt hip strength with emphasis on DF and  hip flexors to improve gait and foot clearance.  PT required verbal and tactile cues for hip alignment with exercise.  Pt fatigued quickly with step-taps on 4" step. Gait pattern remains altered with consistent toe drag with each step.  Pt will continue to benefit from skilled PT to address Rt LE weakness and gait abnormality.    PT Frequency 1x / week    PT Duration 8 weeks    PT Treatment/Interventions ADLs/Self Care Home Management;Electrical Stimulation;Neuromuscular re-education;Therapeutic exercise;Functional mobility training;Stair  training;Gait training;Therapeutic activities;Manual techniques;Patient/family education;Dry needling;Passive range of motion;Joint Manipulations;Spinal Manipulations    PT Next Visit Plan work on heel strike phase of gait with hip advancement, active DF and hip flexion, quad flexibility and mobility, Nu Step    PT Home Exercise Plan Access Code: [email or web address]    Consulted and Agree with Plan of Care Patient           Patient will benefit from skilled therapeutic intervention in order to improve the following deficits and impairments:  Abnormal gait,Decreased strength,Decreased endurance,Impaired flexibility,Postural dysfunction,Pain,Difficulty walking,Decreased range of motion  Visit Diagnosis: Pain in right hip  Muscle weakness (generalized)  Other abnormalities of gait and mobility  Pain in right leg     Problem List Patient Active Problem List   Diagnosis Date Noted  . Arthritis of right hip 09/17/2019  . Labral tear of right hip joint 08/06/2019  . Gluteal tendonitis of right buttock 05/22/2019  . Tear of left gluteus medius tendon 01/04/2019  . Arthritis of left hip 03/20/2018  . Greater trochanteric bursitis of right hip 06/13/2017  . 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 Breast cancer of upper-outer quadrant of left female breast ([place]) 12/07/2014  . Acute recurrent sinusitis 01/17/2013    Sigurd Sos, PT 05/24/20 3:33 PM  [facility] [address] [place], [place], [address] Phone: [phone]   Fax:  [phone]  Name: Christina [name] MRN: [id number] Date of Birth: 1950/07/20

## 2020-05-26 ENCOUNTER — Ambulatory Visit: Payer: PPO | Admitting: Family Medicine

## 2020-05-26 ENCOUNTER — Encounter: Payer: PPO | Admitting: Neurology

## 2020-05-26 NOTE — Progress Notes (Signed)
I, Wendy Poet, LAT, ATC, am serving as scribe for Dr. Lynne Leader.  Christina [name] is a 70 y.o. female who presents to [place] at [facility] today for f/u of R thigh/leg pain and weakness.  She was last seen by Dr. Tamala Julian for these c/o on 12/ /21 and previously by Dr. Georgina Snell on 02/25/20.  She was referred to PT and has completed 3 visits to date.  She's had 2 epidurals to R L3 and a R L3 nerve root block (03/31/20 and 03/10/20).  She was also referred to neurology for an EMG/NCV at her last visit w/ Dr. Tamala Julian.  Since her last visit w/ Dr. Tamala Julian, pt reports that her R leg is better for a few days after PT and then not good again so she says that she "doesn't know how her leg is doing at the moment."  She con't to feel that her R leg is weak and wants to give out.  She feels the most weakness in her R quad/thigh.  Pt states that she fell at the grocery store yesterday but does not recall injuring her R leg when she fell.  She is walking w/ an externally rotated gait pattern on her R leg and dragging her leg/foot when she walks.  She notes she is having a lot of problems at the grocery store when she pushes the grocery cart.  She notes the cart tends to get away from her resulting in falls.  Diagnostic testing: L-spine MRI- 03/06/20; L-spine XR- 02/25/20  Pertinent review of systems: No fevers or chills  Relevant historical information: History of L3 nerve root lumbar radiculopathy.  History of breast cancer.   Exam:  BP (!) 142/82 (BP Location: Right Arm, Patient Position: Sitting, Cuff Size: Normal)   Ht 5\' 3"  (1.6 m)   Wt 118 lb 12.8 oz (53.9 kg)   BMI 21.04 kg/m  General: Well Developed, well nourished, and in no acute distress.   MSK: L-spine normal-appearing nontender midline.  Normal lumbar motion. Right lower extremity strength is intact to hip flexion and knee extension 5/5.  Right foot dorsiflexion is intact 5/5.  Reflexes are intact.  Cap refill and sensation  are intact.  Gait significantly abnormal.  Patient does not flex her hip fully with gait dragging her foot with every step.   Lab and Radiology Results  EXAM: MRI LUMBAR SPINE WITHOUT CONTRAST  TECHNIQUE: Multiplanar, multisequence MR imaging of the lumbar spine was performed. No intravenous contrast was administered.  COMPARISON:  Lumbar spine radiographs 02/25/2020. Lumbar spine MRI 11/02/2016.  FINDINGS: Segmentation: For the purposes of this dictation, five lumbar vertebrae are assumed and the caudal most well-formed intervertebral disc is designated L5-S1. Spinal numbering will remain consistent with that utilized on the prior MRI of 11/02/2016.  Alignment: Straightening of the expected lumbar lordosis. No significant spondylolisthesis.  Vertebrae: Vertebral body height is maintained. No significant marrow edema or focal suspicious osseous lesion.  Conus medullaris and cauda equina: Conus extends to the T12-L1 level. No signal abnormality within the visualized distal spinal cord.  Paraspinal and other soft tissues: No abnormality identified within included portions of the abdomen/retroperitoneum. Paraspinal soft tissues within normal limits.  Disc levels:  Unless otherwise stated, the level by level findings below have not significantly changed since prior MRI 11/02/2016.  Congenitally narrow lumbar spinal canal on the basis of short pedicles.  Multilevel disc degeneration. Most notably, there is mild/moderate disc degeneration at L2-L3 and L3-L4.  T12-L1: No  significant disc herniation or stenosis.  L1-L2: A previously described 4 mm T2 hyperintense lesion within the right extraforaminal region is no longer definitively appreciated. Minimal disc bulge. Mild facet arthrosis. No significant spinal canal or foraminal narrowing.  L2-L3: Disc bulge with endplate spurring. Superimposed small right foraminal disc protrusion. Mild facet  arthrosis/ligamentum flavum hypertrophy. Mild/moderate bilateral subarticular narrowing without frank nerve root impingement. Mild relative narrowing of the central canal. Mild right neural foraminal narrowing.  L3-L4: Disc bulge. Superimposed right foraminal/extraforaminal cranially migrated disc extrusion. Mild facet arthrosis/ligamentum flavum hypertrophy. Mild/moderate bilateral subarticular stenosis without frank nerve root impingement. Mild relative narrowing of the central canal. As before, the disc extrusion contributes to severe right neural foraminal narrowing with encroachment upon the exiting right L3 nerve root. Mild/moderate left neural foraminal narrowing.  L4-L5: Disc bulge with mild endplate spurring. Mild facet arthrosis. Mild bilateral subarticular narrowing without nerve root impingement. Minimal relative narrowing of the central canal. Mild right neural foraminal narrowing. Moderate to moderately severe left neural foraminal narrowing, slightly progressed.  L5-S1: Minimal facet arthrosis. No significant disc herniation or stenosis.  IMPRESSION: Lumbar spondylosis as outlined with findings most notably as follows. Apart from slight progression of moderate to moderately severe left L4-L5 neural foraminal narrowing, findings have not significantly changed since the MRI of 11/02/2016  At L3-L4, there is mild/moderate disc degeneration. Unchanged right foraminal/extraforaminal cranially migrated disc extrusion contributing to severe right neural foraminal narrowing, approaching upon the exiting right L3 nerve root. Multifactorial mild/moderate bilateral subarticular narrowing without frank nerve root impingement. Mild/moderate left neural foraminal narrowing.  At L2-L3, there is mild/moderate disc degeneration. Multifactorial mild/moderate bilateral subarticular narrowing without frank nerve root impingement. Mild right neural foraminal narrowing.  At  L4-L5, there is multifactorial moderate to moderately severe left neural foraminal narrowing, slightly progressed. Multifactorial mild spinal canal and right neural foraminal narrowing.   Electronically Signed   By: Kellie Simmering DO   On: 03/07/2020 15:49 I, Lynne Leader, personally (independently) visualized and performed the interpretation of the images attached in this note.    Assessment and Plan: 70 y.o. female with gait disturbance.  Patient has significantly abnormal gait.  She has been receiving physical therapy and progressing but still having real world problems with falls in the grocery store.  Fundamentally if she pushes her grocery cart she is dragging her right foot and the grocery cart gets ahead of her resulting ultimately in a fall.  She has preserved strength to hip flexion and knee extension so not sure exactly why she still having such a problem with her gait.  I think she will benefit from continued physical therapy.  Plan to reassess in 6 weeks.  If not better would consider nerve conduction study to further explain causes of dysfunction.  Ultimately she may benefit from nerve decompression based on MRI of spinal cord.   PDMP not reviewed this encounter. No orders of the defined types were placed in this encounter.  No orders of the defined types were placed in this encounter.    Discussed warning signs or symptoms. Please see discharge instructions. Patient expresses understanding.   The above documentation has been reviewed and is accurate and complete Lynne Leader, M.D.

## 2020-05-27 ENCOUNTER — Encounter: Payer: Self-pay | Admitting: Family Medicine

## 2020-05-27 ENCOUNTER — Ambulatory Visit: Payer: PPO | Admitting: Family Medicine

## 2020-05-27 VITALS — BP 142/82 | Ht 63.0 in | Wt 118.8 lb

## 2020-05-27 DIAGNOSIS — R269 Unspecified abnormalities of gait and mobility: Secondary | ICD-10-CM | POA: Diagnosis not present

## 2020-05-27 DIAGNOSIS — R296 Repeated falls: Secondary | ICD-10-CM | POA: Diagnosis not present

## 2020-05-27 DIAGNOSIS — S3421XD Injury of nerve root of lumbar spine, subsequent encounter: Secondary | ICD-10-CM | POA: Diagnosis not present

## 2020-05-27 NOTE — Patient Instructions (Signed)
Thank you for coming in today. Recheck with me in 6 weeks to reassess gait.   Really work on picking up the foot when walking.   The shopping cart try to pull it along instead of push it.   If not better we may do more testing.

## 2020-06-01 ENCOUNTER — Other Ambulatory Visit: Payer: Self-pay

## 2020-06-01 ENCOUNTER — Ambulatory Visit: Payer: PPO | Attending: Family Medicine

## 2020-06-01 DIAGNOSIS — M62838 Other muscle spasm: Secondary | ICD-10-CM | POA: Insufficient documentation

## 2020-06-01 DIAGNOSIS — M79604 Pain in right leg: Secondary | ICD-10-CM | POA: Diagnosis not present

## 2020-06-01 DIAGNOSIS — M6281 Muscle weakness (generalized): Secondary | ICD-10-CM

## 2020-06-01 DIAGNOSIS — R2689 Other abnormalities of gait and mobility: Secondary | ICD-10-CM | POA: Diagnosis not present

## 2020-06-01 DIAGNOSIS — S76111A Strain of right quadriceps muscle, fascia and tendon, initial encounter: Secondary | ICD-10-CM | POA: Insufficient documentation

## 2020-06-01 DIAGNOSIS — M25551 Pain in right hip: Secondary | ICD-10-CM | POA: Diagnosis not present

## 2020-06-01 NOTE — Therapy (Signed)
[facility] [address] [place], [place], [address] Phone: [phone]   Fax:  [phone]  Physical Therapy Treatment  Patient Details  Name: Christina [name] MRN: [id number] Date of Birth: [birth year] Referring Provider (PT): Lyndal Pulley, DO   Encounter Date: 06/01/2020   PT End of Session - 06/01/20 1101    Visit Number 4    Date for PT Re-Evaluation 07/06/20    Authorization Type [employer]    Progress Note Due on Visit 10    PT Start Time 1017    PT Stop Time 1102    PT Time Calculation (min) 45 min    Activity Tolerance Patient tolerated treatment well    Behavior During Therapy [facility] for tasks assessed/performed           Past Medical History:  Diagnosis Date  . Abrasion of right leg 12/17/2014  . Adrenal insufficiency ([place])   . Breast cancer of upper-outer quadrant of left female breast ([place]) 12/07/2014  . Cataract, immature    bilateral  . Dental bridge present    upper - x 2  . Dental crowns present   . Hypothyroidism   . Personal history of radiation therapy   . Prediabetes     Past Surgical History:  Procedure Laterality Date  . ABDOMINOPLASTY    . BALLOON SINUPLASTY    . BREAST LUMPECTOMY Left 2016  . BREAST LUMPECTOMY WITH RADIOACTIVE SEED AND SENTINEL LYMPH NODE BIOPSY Left 12/22/2014   Procedure: BREAST LUMPECTOMY WITH RADIOACTIVE SEED AND SENTINEL LYMPH NODE BIOPSY;  Surgeon: Stark Klein, MD;  Location: [place];  Service: General;  Laterality: Left;  . BUNIONECTOMY Right   . HAMMER TOE SURGERY Bilateral   . RHINOPLASTY    . SEPTOPLASTY      There were no vitals filed for this visit.   Subjective Assessment - 06/01/20 1029    Subjective I did good after last visit and then it got cold and my leg is stuck now.  My hip flexors feel sore.    Currently in Pain? No/denies                             [facility] Adult PT Treatment/Exercise -  06/01/20 0001      Ambulation/Gait   Pre-Gait Activities heel strike with step-through on the Rt    Gait Comments emphasis on initiation of hip flexion when in extension on the Rt. Multiple passes along the countertop.      Knee/Hip Exercises: Stretches   Personnel officer Limitations prone with strap      Knee/Hip Exercises: Aerobic   Nustep Level 2  x 10 minutes-PT present to discuss progress      Knee/Hip Exercises: Standing   Hip Abduction Stengthening;Both;2 sets;10 reps    Hip Extension Both;2 sets;10 reps    Other Standing Knee Exercises step tap on 4" step with Rt: emphasis on alignment      Knee/Hip Exercises: Seated   Marching Strengthening;Both;2 sets;10 reps    Marching Limitations holding 5# kettle bell on thigh      Knee/Hip Exercises: Supine   Straight Leg Raises Strengthening;AROM;2 sets;5 reps    Straight Leg Raises Limitations independent with 4-5 each set                  PT Education - 06/01/20 1046  Education Details Access Code: [email or web address]    Person(s) Educated Patient    Methods Explanation;Demonstration;Handout    Comprehension Verbalized understanding;Returned demonstration            PT Short Term Goals - 06/01/20 1031      PT SHORT TERM GOAL #1   Title Pt will be ind with initial HEP    Status Achieved      PT SHORT TERM GOAL #2   Title Pt will be able to perform 10 reps of SLR on Rt LE to demo improved Rt hip flexor strength    Baseline 4-5      PT SHORT TERM GOAL #3   Title Pt will be able to perform step through gait with consistent heel strike on Rt LE x 40 feet    Baseline 15-20 feet then shuffles LE    Status On-going             PT Long Term Goals - 05/11/20 1331      PT LONG TERM GOAL #1   Title Pt with achieve Rt hip flexor strength of at least 4/5 for improved daily task endurance and ambulation.    Time 8    Period Weeks    Status New    Target Date 07/06/20      PT LONG  TERM GOAL #2   Title Pt will perform 5x sit to stand without compensation of thighs pushing back on table in 12 sec or less to demo reduced fall risk and improved transfers.    Baseline 14, using back of thighs on table last 2 reps    Time 8    Period Weeks    Status New    Target Date 07/06/20      PT LONG TERM GOAL #3   Title Pt will perform TUG in 12 sec or less with pivot turn without LOB to demo reduced fall risk    Baseline 14, multi-step turns    Time 8    Period Weeks    Status New    Target Date 07/06/20      PT LONG TERM GOAL #4   Title Pt will be able to demo gait symmetry in a 3 min walk test with min shuffling of Rt LE    Baseline -    Time 8    Period Weeks    Status New    Target Date 07/06/20      PT LONG TERM GOAL #5   Title Pt will be ind with advanced HEP    Time 8    Period Weeks    Status New    Target Date 07/06/20                 Plan - 06/01/20 1056    Clinical Impression Statement Pt with ongoing gait dysfunction related to Rt hip weakness and pain.  PT added some exercises to HEP per pt request that were included in her HEP last time she had PT at this clinic.  Session focused on Rt hip strength with emphasis on DF and hip flexors to improve gait and foot clearance.  PT required verbal and tactile cues for hip alignment with exercise as she tends to toe out on the Rt.  Pt demonstrated improved tolerance and endurance for Rt step-taps on the 4" step.  Pt was able to do 5 SLRs on the Rt with good form before fatigue. Gait pattern remains altered with consistent toe drag with  each step.  Pt will continue to benefit from skilled PT to address Rt LE weakness and gait abnormality.    Rehab Potential Good    PT Frequency 1x / week    PT Duration 8 weeks    PT Treatment/Interventions ADLs/Self Care Home Management;Electrical Stimulation;Neuromuscular re-education;Therapeutic exercise;Functional mobility training;Stair training;Gait training;Therapeutic  activities;Manual techniques;Patient/family education;Dry needling;Passive range of motion;Joint Manipulations;Spinal Manipulations    PT Next Visit Plan work on heel strike phase of gait with hip advancement, active DF and hip flexion, quad flexibility and mobility, Nu Step    Consulted and Agree with Plan of Care Patient           Patient will benefit from skilled therapeutic intervention in order to improve the following deficits and impairments:  Abnormal gait,Decreased strength,Decreased endurance,Impaired flexibility,Postural dysfunction,Pain,Difficulty walking,Decreased range of motion  Visit Diagnosis: Pain in right hip  Muscle weakness (generalized)  Other abnormalities of gait and mobility     Problem List Patient Active Problem List   Diagnosis Date Noted  . Arthritis of right hip 09/17/2019  . Labral tear of right hip joint 08/06/2019  . Gluteal tendonitis of right buttock 05/22/2019  . Tear of left gluteus medius tendon 01/04/2019  . Arthritis of left hip 03/20/2018  . Greater trochanteric bursitis of right hip 06/13/2017  . 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 Breast cancer of upper-outer quadrant of left female breast ([facility]) 12/07/2014  . Acute recurrent sinusitis 01/17/2013     Lorrene Reid, PT 06/01/20 11:37 AM  [facility] [address] [place], [place], [address] Phone: [phone]   Fax:  [phone]  Name: SHELLEY COCKE MRN: [id number] Date of Birth: 10/05/1950

## 2020-06-01 NOTE — Patient Instructions (Signed)
Access Code: [email or web address] URL: [email or web address] Date: 06/01/2020 Prepared by: Claiborne Billings    Supine Piriformis Stretch with Leg Straight - 2 x daily - 7 x weekly - 1 sets - 3 reps - 20-30 hold Seated Hamstring Stretch - 2 x daily - 7 x weekly - 1 sets - 3 reps - 20-30 hold Standing Hip Abduction with Counter Support - 1 x daily - 7 x weekly - 2 sets - 10 reps Standing Hip Extension with Counter Support - 1 x daily - 7 x weekly - 2 sets - 10 reps Step Taps on High Step - 1 x daily - 7 x weekly - 2 sets - 10 reps

## 2020-06-02 ENCOUNTER — Other Ambulatory Visit: Payer: Self-pay | Admitting: Family

## 2020-06-08 ENCOUNTER — Other Ambulatory Visit: Payer: Self-pay

## 2020-06-08 ENCOUNTER — Ambulatory Visit: Payer: PPO

## 2020-06-08 DIAGNOSIS — M62838 Other muscle spasm: Secondary | ICD-10-CM

## 2020-06-08 DIAGNOSIS — M79604 Pain in right leg: Secondary | ICD-10-CM

## 2020-06-08 DIAGNOSIS — R2689 Other abnormalities of gait and mobility: Secondary | ICD-10-CM

## 2020-06-08 DIAGNOSIS — S76111A Strain of right quadriceps muscle, fascia and tendon, initial encounter: Secondary | ICD-10-CM

## 2020-06-08 DIAGNOSIS — M25551 Pain in right hip: Secondary | ICD-10-CM | POA: Diagnosis not present

## 2020-06-08 DIAGNOSIS — M6281 Muscle weakness (generalized): Secondary | ICD-10-CM

## 2020-06-08 NOTE — Therapy (Signed)
[facility] [address], [place], [place], [address] Phone: [phone]   Fax:  [phone]  Physical Therapy Treatment  Patient Details  Name: Christina [name] MRN: [id number] Date of Birth: 1950-07-24 Referring Provider (PT): Lyndal Pulley, DO   Encounter Date: 06/08/2020   PT End of Session - 06/08/20 1058    Visit Number 5    Date for PT Re-Evaluation 07/06/20    Authorization Type [employer]    Progress Note Due on Visit 10    PT Start Time 1013    PT Stop Time 1056    PT Time Calculation (min) 43 min    Activity Tolerance Patient tolerated treatment well    Behavior During Therapy [facility] for tasks assessed/performed           Past Medical History:  Diagnosis Date  . Abrasion of right leg 12/17/2014  . Adrenal insufficiency ([place])   . Breast cancer of upper-outer quadrant of left female breast ([place]) 12/07/2014  . Cataract, immature    bilateral  . Dental bridge present    upper - x 2  . Dental crowns present   . Hypothyroidism   . Personal history of radiation therapy   . Prediabetes     Past Surgical History:  Procedure Laterality Date  . ABDOMINOPLASTY    . BALLOON SINUPLASTY    . BREAST LUMPECTOMY Left 2016  . BREAST LUMPECTOMY WITH RADIOACTIVE SEED AND SENTINEL LYMPH NODE BIOPSY Left 12/22/2014   Procedure: BREAST LUMPECTOMY WITH RADIOACTIVE SEED AND SENTINEL LYMPH NODE BIOPSY;  Surgeon: Stark Klein, MD;  Location: [place];  Service: General;  Laterality: Left;  . BUNIONECTOMY Right   . HAMMER TOE SURGERY Bilateral   . RHINOPLASTY    . SEPTOPLASTY      There were no vitals filed for this visit.   Subjective Assessment - 06/08/20 1009    Subjective I got my NuStep at home and I am using it a lot.  My quads are sore.    Currently in Pain? No/denies                             [facility] Adult PT Treatment/Exercise - 06/08/20 0001       Ambulation/Gait   Pre-Gait Activities extensive work on heel strike and hip flexion to initiate step forward with Rt: used colored discs and short cones.  Many passes at this with verbal and demo cueing.    Gait Comments emphasis on initiation of hip flexion when in extension on the Rt. Multiple passes along the countertop.      High Level Balance   High Level Balance Comments colored discs on floor: step over short cone to tap disc when each color is called out      Knee/Hip Exercises: Stretches   Sports administrator Right;5 reps;20 seconds    Quad Stretch Limitations prone with strap      Knee/Hip Exercises: Aerobic   Nustep Level 2  x 5 minutes-PT present to discuss progress   Pt has NuStep at home and was fatigued today                   PT Short Term Goals - 06/08/20 1015      PT SHORT TERM GOAL #3   Title Pt will be able to perform step through gait with consistent heel strike on Rt LE x 40 feet  Baseline 20-25 feet then shuffles LE    Status On-going             PT Long Term Goals - 05/11/20 1331      PT LONG TERM GOAL #1   Title Pt with achieve Rt hip flexor strength of at least 4/5 for improved daily task endurance and ambulation.    Time 8    Period Weeks    Status New    Target Date 07/06/20      PT LONG TERM GOAL #2   Title Pt will perform 5x sit to stand without compensation of thighs pushing back on table in 12 sec or less to demo reduced fall risk and improved transfers.    Baseline 14, using back of thighs on table last 2 reps    Time 8    Period Weeks    Status New    Target Date 07/06/20      PT LONG TERM GOAL #3   Title Pt will perform TUG in 12 sec or less with pivot turn without LOB to demo reduced fall risk    Baseline 14, multi-step turns    Time 8    Period Weeks    Status New    Target Date 07/06/20      PT LONG TERM GOAL #4   Title Pt will be able to demo gait symmetry in a 3 min walk test with min shuffling of Rt LE    Baseline -     Time 8    Period Weeks    Status New    Target Date 07/06/20      PT LONG TERM GOAL #5   Title Pt will be ind with advanced HEP    Time 8    Period Weeks    Status New    Target Date 07/06/20                 Plan - 06/08/20 1041    Clinical Impression Statement Pt with ongoing gait dysfunction related to Rt hip weakness and pain.  Pt brought her cane and AFO brace with her today to reduce fatigue with compensation.   Session focused on Rt hip strength with emphasis on DF and hip flexors to improve gait and foot clearance.  Pt used discs and short cones to improve heel strike and activation of hip flexors.    Alignment of the Rt hip is improved today with fewer verbal cues needed.  Gait pattern remains altered with consistent toe drag with each step when PT was not providing cueing . Pt will continue to benefit from skilled PT to address Rt LE weakness and gait abnormality.    PT Frequency 1x / week    PT Duration 8 weeks    PT Treatment/Interventions ADLs/Self Care Home Management;Electrical Stimulation;Neuromuscular re-education;Therapeutic exercise;Functional mobility training;Stair training;Gait training;Therapeutic activities;Manual techniques;Patient/family education;Dry needling;Passive range of motion;Joint Manipulations;Spinal Manipulations    PT Next Visit Plan work on heel strike phase of gait with hip advancement, active DF and hip flexion, quad flexibility and mobility, Nu Step    PT Home Exercise Plan Access Code: [email or web address]    Consulted and Agree with Plan of Care Patient           Patient will benefit from skilled therapeutic intervention in order to improve the following deficits and impairments:  Abnormal gait,Decreased strength,Decreased endurance,Impaired flexibility,Postural dysfunction,Pain,Difficulty walking,Decreased range of motion  Visit Diagnosis: Pain in right hip  Muscle weakness (generalized)  Other  abnormalities of gait and mobility  Pain in  right leg  Other muscle spasm  Strain of right quadriceps, initial encounter     Problem List Patient Active Problem List   Diagnosis Date Noted  . Arthritis of right hip 09/17/2019  . Labral tear of right hip joint 08/06/2019  . Gluteal tendonitis of right buttock 05/22/2019  . Tear of left gluteus medius tendon 01/04/2019  . Arthritis of left hip 03/20/2018  . Greater trochanteric bursitis of right hip 06/13/2017  . 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 Breast cancer of upper-outer quadrant of left female breast ([place]) 12/07/2014  . Acute recurrent sinusitis 01/17/2013     Sigurd Sos, PT 06/08/20 11:01 AM  [facility] [address] [place], [place], [address] Phone: [phone]   Fax:  [phone]  Name: Christina [name] MRN: [id number] Date of Birth: 1950-12-14

## 2020-06-15 ENCOUNTER — Other Ambulatory Visit: Payer: Self-pay

## 2020-06-15 ENCOUNTER — Ambulatory Visit: Payer: PPO

## 2020-06-15 DIAGNOSIS — R2689 Other abnormalities of gait and mobility: Secondary | ICD-10-CM

## 2020-06-15 DIAGNOSIS — M79604 Pain in right leg: Secondary | ICD-10-CM

## 2020-06-15 DIAGNOSIS — M25551 Pain in right hip: Secondary | ICD-10-CM

## 2020-06-15 DIAGNOSIS — M62838 Other muscle spasm: Secondary | ICD-10-CM

## 2020-06-15 DIAGNOSIS — M6281 Muscle weakness (generalized): Secondary | ICD-10-CM

## 2020-06-15 NOTE — Therapy (Signed)
[facility] [address] [place], [place], [address] Phone: [phone]   Fax:  [phone]  Physical Therapy Treatment  Patient Details  Name: Christina [name] MRN: [id number] Date of Birth: [birth year] Referring Provider (PT): Lyndal Pulley, DO   Encounter Date: 06/15/2020   PT End of Session - 06/15/20 1054    Visit Number 6    Date for PT Re-Evaluation 07/06/20    Authorization Type [employer]    Progress Note Due on Visit 10    PT Start Time 1014    PT Stop Time 1054    PT Time Calculation (min) 40 min    Activity Tolerance Patient tolerated treatment well    Behavior During Therapy [facility] for tasks assessed/performed           Past Medical History:  Diagnosis Date  . Abrasion of right leg 12/17/2014  . Adrenal insufficiency ([place])   . Breast cancer of upper-outer quadrant of left female breast ([place]) 12/07/2014  . Cataract, immature    bilateral  . Dental bridge present    upper - x 2  . Dental crowns present   . Hypothyroidism   . Personal history of radiation therapy   . Prediabetes     Past Surgical History:  Procedure Laterality Date  . ABDOMINOPLASTY    . BALLOON SINUPLASTY    . BREAST LUMPECTOMY Left 2016  . BREAST LUMPECTOMY WITH RADIOACTIVE SEED AND SENTINEL LYMPH NODE BIOPSY Left 12/22/2014   Procedure: BREAST LUMPECTOMY WITH RADIOACTIVE SEED AND SENTINEL LYMPH NODE BIOPSY;  Surgeon: Stark Klein, MD;  Location: [place];  Service: General;  Laterality: Left;  . BUNIONECTOMY Right   . HAMMER TOE SURGERY Bilateral   . RHINOPLASTY    . SEPTOPLASTY      There were no vitals filed for this visit.   Subjective Assessment - 06/15/20 1015    Subjective I am working on clearing my foot.    Currently in Pain? No/denies                             [facility] Adult PT Treatment/Exercise - 06/15/20 0001      Ambulation/Gait   Pre-Gait Activities  extensive work on heel strike and hip flexion to initiate step forward with Rt: used colored discs and short cones.  Many passes at this with verbal and demo cueing.    Gait Comments emphasis on initiation of hip flexion when in extension on the Rt. Multiple passes along the countertop.      High Level Balance   High Level Balance Comments colored discs on floor: step over short cone to tap disc when each color is called out      Lumbar Exercises: Supine   Ab Set 10 reps    Bent Knee Raise 10 reps    Straight Leg Raise 10 reps    Straight Leg Raises Limitations 2x5 bil with abdominal bracing      Knee/Hip Exercises: Stretches   Quad Stretch Right;5 reps;20 seconds    Quad Stretch Limitations prone with strap      Knee/Hip Exercises: Seated   Marching Strengthening;Both;2 sets;10 reps    Marching Limitations holding 5# kettle bell on thigh                  PT Education - 06/15/20 1043    Education Details Access Code: [email or web address]    Person(s)  Educated Patient    Methods Explanation;Demonstration;Handout    Comprehension Verbalized understanding;Returned demonstration            PT Short Term Goals - 06/08/20 1015      PT SHORT TERM GOAL #3   Title Pt will be able to perform step through gait with consistent heel strike on Rt LE x 40 feet    Baseline 20-25 feet then shuffles LE    Status On-going             PT Long Term Goals - 05/11/20 1331      PT LONG TERM GOAL #1   Title Pt with achieve Rt hip flexor strength of at least 4/5 for improved daily task endurance and ambulation.    Time 8    Period Weeks    Status New    Target Date 07/06/20      PT LONG TERM GOAL #2   Title Pt will perform 5x sit to stand without compensation of thighs pushing back on table in 12 sec or less to demo reduced fall risk and improved transfers.    Baseline 14, using back of thighs on table last 2 reps    Time 8    Period Weeks    Status New    Target Date 07/06/20      PT  LONG TERM GOAL #3   Title Pt will perform TUG in 12 sec or less with pivot turn without LOB to demo reduced fall risk    Baseline 14, multi-step turns    Time 8    Period Weeks    Status New    Target Date 07/06/20      PT LONG TERM GOAL #4   Title Pt will be able to demo gait symmetry in a 3 min walk test with min shuffling of Rt LE    Baseline -    Time 8    Period Weeks    Status New    Target Date 07/06/20      PT LONG TERM GOAL #5   Title Pt will be ind with advanced HEP    Time 8    Period Weeks    Status New    Target Date 07/06/20                 Plan - 06/15/20 1056    Clinical Impression Statement Pt with ongoing gait dysfunction related to Rt hip weakness and pain.  Pt brought her cane and AFO brace with her today to reduce fatigue with compensation. Pt arrived with improved gait pattern with improved hip flexion and foot clearance.   Session focused on Rt hip strength with emphasis on DF and hip flexors to improve gait and foot clearance.  Pt used discs and short cones to improve heel strike and activation of hip flexors.    Gait pattern remains altered with intermittent toe drag with each step when PT was not providing cueing . Pt will continue to benefit from skilled PT to address Rt LE weakness and gait abnormality.    PT Frequency 1x / week    PT Duration 8 weeks    PT Treatment/Interventions ADLs/Self Care Home Management;Electrical Stimulation;Neuromuscular re-education;Therapeutic exercise;Functional mobility training;Stair training;Gait training;Therapeutic activities;Manual techniques;Patient/family education;Dry needling;Passive range of motion;Joint Manipulations;Spinal Manipulations    PT Next Visit Plan work on heel strike phase of gait with hip advancement, active DF and hip flexion, quad flexibility and mobility, Nu Step    PT Home  Exercise Plan Access Code: [email or web address]    Consulted and Agree with Plan of Care Patient           Patient will benefit  from skilled therapeutic intervention in order to improve the following deficits and impairments:  Abnormal gait,Decreased strength,Decreased endurance,Impaired flexibility,Postural dysfunction,Pain,Difficulty walking,Decreased range of motion  Visit Diagnosis: Pain in right hip  Muscle weakness (generalized)  Other abnormalities of gait and mobility  Pain in right leg  Other muscle spasm     Problem List Patient Active Problem List   Diagnosis Date Noted  . Arthritis of right hip 09/17/2019  . Labral tear of right hip joint 08/06/2019  . Gluteal tendonitis of right buttock 05/22/2019  . Tear of left gluteus medius tendon 01/04/2019  . Arthritis of left hip 03/20/2018  . Greater trochanteric bursitis of right hip 06/13/2017  . 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 Breast cancer of upper-outer quadrant of left female breast ([place]) 12/07/2014  . Acute recurrent sinusitis 01/17/2013    Claressa Hughley 06/15/2020, 10:57 AM  [facility] [address] [place], [place], [address] Phone: [phone]   Fax:  [phone]  Name: SATHVIKA OJO MRN: [id number] Date of Birth: Aug 02, 1950

## 2020-06-15 NOTE — Patient Instructions (Signed)
Access Code: [email or web address] URL: [email or web address] Date: 06/15/2020 Prepared by: Claiborne Billings  Exercises   Supine Active Straight Leg Raise - 2 x daily - 7 x weekly - 2 sets - 5 reps Supine Transversus Abdominis Bracing - Hands on Stomach - 1 x daily - 7 x weekly - 1 sets - 10 reps - 5 hold Seated Transversus Abdominis Bracing - 1 x daily - 7 x weekly - 1 sets - 10 reps - 5 hold Supine March - 2 x daily - 7 x weekly - 1 sets - 10 reps - 5 hold

## 2020-06-22 ENCOUNTER — Other Ambulatory Visit: Payer: Self-pay

## 2020-06-22 ENCOUNTER — Ambulatory Visit: Payer: PPO

## 2020-06-22 DIAGNOSIS — M6281 Muscle weakness (generalized): Secondary | ICD-10-CM

## 2020-06-22 DIAGNOSIS — R2689 Other abnormalities of gait and mobility: Secondary | ICD-10-CM

## 2020-06-22 DIAGNOSIS — M62838 Other muscle spasm: Secondary | ICD-10-CM

## 2020-06-22 DIAGNOSIS — M25551 Pain in right hip: Secondary | ICD-10-CM | POA: Diagnosis not present

## 2020-06-22 DIAGNOSIS — M79604 Pain in right leg: Secondary | ICD-10-CM

## 2020-06-22 NOTE — Therapy (Addendum)
[facility] [address], [place], [place], [address] Phone: [phone]   Fax:  [phone]  Physical Therapy Treatment  Patient Details  Name: Christina [name] MRN: [id number] Date of Birth: 08-Sep-1950 Referring Provider (PT): Lyndal Pulley, DO   Encounter Date: 06/22/2020   PT End of Session - 06/22/20 1056    Visit Number 7    Date for PT Re-Evaluation 07/06/20    Authorization Type [employer]    Progress Note Due on Visit 10    PT Start Time 1014    PT Stop Time 1056    PT Time Calculation (min) 42 min    Activity Tolerance Patient tolerated treatment well    Behavior During Therapy [facility] for tasks assessed/performed           Past Medical History:  Diagnosis Date  . Abrasion of right leg 12/17/2014  . Adrenal insufficiency ([place])   . Breast cancer of upper-outer quadrant of left female breast ([place]) 12/07/2014  . Cataract, immature    bilateral  . Dental bridge present    upper - x 2  . Dental crowns present   . Hypothyroidism   . Personal history of radiation therapy   . Prediabetes     Past Surgical History:  Procedure Laterality Date  . ABDOMINOPLASTY    . BALLOON SINUPLASTY    . BREAST LUMPECTOMY Left 2016  . BREAST LUMPECTOMY WITH RADIOACTIVE SEED AND SENTINEL LYMPH NODE BIOPSY Left 12/22/2014   Procedure: BREAST LUMPECTOMY WITH RADIOACTIVE SEED AND SENTINEL LYMPH NODE BIOPSY;  Surgeon: Stark Klein, MD;  Location: [facility] [facility];  Service: General;  Laterality: Left;  . BUNIONECTOMY Right   . HAMMER TOE SURGERY Bilateral   . RHINOPLASTY    . SEPTOPLASTY      There were no vitals filed for this visit.   Subjective Assessment - 06/22/20 1015    Subjective I am working on lifting my Rt leg.    Currently in Pain? No/denies                             [facility] Adult PT Treatment/Exercise - 06/22/20 0001      Ambulation/Gait   Pre-Gait Activities work  on heel strike and activating hip flexor using cones and discs    Gait Comments emphasis on initiation of hip flexion when in extension on the Rt. Multiple passes along the countertop.      High Level Balance   High Level Balance Comments colored discs on floor: step over short cone to tap disc when each color is called out      Knee/Hip Exercises: Stretches   Sports administrator Right;5 reps;20 seconds    Quad Stretch Limitations passive by PT to pt tolerance      Knee/Hip Exercises: Standing   Forward Step Up Right;2 sets;10 reps;Step Height: 6";Hand Hold: 2    Forward Step Up Limitations step tap on Rt to activate hip flexor prior to step-tap exercise      Manual Therapy   Manual Therapy Soft tissue mobilization    Manual therapy comments Addaday to Rt quad and gentle quad stretch in prone                    PT Short Term Goals - 06/22/20 1017      PT SHORT TERM GOAL #1   Title Pt will be ind with initial HEP  PT SHORT TERM GOAL #2   Title Pt will be able to perform 10 reps of SLR on Rt LE to demo improved Rt hip flexor strength    Status Achieved      PT SHORT TERM GOAL #3   Title Pt will be able to perform step through gait with consistent heel strike on Rt LE x 40 feet    Baseline 30 feet without verbal cueing    Status On-going             PT Long Term Goals - 05/11/20 1331      PT LONG TERM GOAL #1   Title Pt with achieve Rt hip flexor strength of at least 4/5 for improved daily task endurance and ambulation.    Time 8    Period Weeks    Status New    Target Date 07/06/20      PT LONG TERM GOAL #2   Title Pt will perform 5x sit to stand without compensation of thighs pushing back on table in 12 sec or less to demo reduced fall risk and improved transfers.    Baseline 14, using back of thighs on table last 2 reps    Time 8    Period Weeks    Status New    Target Date 07/06/20      PT LONG TERM GOAL #3   Title Pt will perform TUG in 12 sec or less  with pivot turn without LOB to demo reduced fall risk    Baseline 14, multi-step turns    Time 8    Period Weeks    Status New    Target Date 07/06/20      PT LONG TERM GOAL #4   Title Pt will be able to demo gait symmetry in a 3 min walk test with min shuffling of Rt LE    Baseline -    Time 8    Period Weeks    Status New    Target Date 07/06/20      PT LONG TERM GOAL #5   Title Pt will be ind with advanced HEP    Time 8    Period Weeks    Status New    Target Date 07/06/20                 Plan - 06/22/20 1020    Clinical Impression Statement Pt with ongoing gait dysfunction related to Rt hip weakness and pain.  Pt brought her cane and AFO brace with her today to reduce fatigue with compensation. Pt arrived with improved gait pattern with improved hip flexion and foot clearance and is able to show more consistency with clearing the Rt foot with step through phase of gait due to improved activation of Rt hip flexor.  Session focused on Rt hip strength with emphasis on DF and hip flexors to improve gait and foot clearance.  Pt used discs and short cones to improve heel strike and activation of hip flexors.    Gait pattern remains altered with intermittent toe drag with each step when PT was not providing cueing. Pt will continue to benefit from skilled PT to address Rt LE weakness and gait abnormality.    PT Frequency 1x / week    PT Duration 8 weeks    PT Treatment/Interventions ADLs/Self Care Home Management;Electrical Stimulation;Neuromuscular re-education;Therapeutic exercise;Functional mobility training;Stair training;Gait training;Therapeutic activities;Manual techniques;Patient/family education;Dry needling;Passive range of motion;Joint Manipulations;Spinal Manipulations    PT Next Visit Plan work  on heel strike phase of gait with hip advancement, active DF and hip flexion, quad flexibility and mobility, work on steps    PT Home Exercise Plan Access Code: [email or web address]     Consulted and Agree with Plan of Care Patient           Patient will benefit from skilled therapeutic intervention in order to improve the following deficits and impairments:  Abnormal gait,Decreased strength,Decreased endurance,Impaired flexibility,Postural dysfunction,Pain,Difficulty walking,Decreased range of motion  Visit Diagnosis: Other abnormalities of gait and mobility  Muscle weakness (generalized)  Pain in right hip  Pain in right leg  Other muscle spasm     Problem List Patient Active Problem List   Diagnosis Date Noted  . Arthritis of right hip 09/17/2019  . Labral tear of right hip joint 08/06/2019  . Gluteal tendonitis of right buttock 05/22/2019  . Tear of left gluteus medius tendon 01/04/2019  . Arthritis of left hip 03/20/2018  . Greater trochanteric bursitis of right hip 06/13/2017  . 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 Breast cancer of upper-outer quadrant of left female breast ([place]) 12/07/2014  . Acute recurrent sinusitis 01/17/2013     Sigurd Sos, PT 06/22/20 11:01 AM  [facility] [address], [place], [place], [address] Phone: [phone]   Fax:  [phone]  Name: KAILIA STARRY MRN: [id number] Date of Birth: Jun 14, 1950

## 2020-06-23 DIAGNOSIS — Z85828 Personal history of other malignant neoplasm of skin: Secondary | ICD-10-CM | POA: Diagnosis not present

## 2020-06-23 DIAGNOSIS — L820 Inflamed seborrheic keratosis: Secondary | ICD-10-CM | POA: Diagnosis not present

## 2020-06-23 DIAGNOSIS — L720 Epidermal cyst: Secondary | ICD-10-CM | POA: Diagnosis not present

## 2020-06-23 DIAGNOSIS — L570 Actinic keratosis: Secondary | ICD-10-CM | POA: Diagnosis not present

## 2020-06-23 DIAGNOSIS — D692 Other nonthrombocytopenic purpura: Secondary | ICD-10-CM | POA: Diagnosis not present

## 2020-06-23 DIAGNOSIS — L821 Other seborrheic keratosis: Secondary | ICD-10-CM | POA: Diagnosis not present

## 2020-06-25 ENCOUNTER — Other Ambulatory Visit: Payer: Self-pay | Admitting: Family

## 2020-06-28 ENCOUNTER — Encounter: Payer: Self-pay | Admitting: Physical Therapy

## 2020-06-28 ENCOUNTER — Ambulatory Visit: Payer: PPO | Admitting: Physical Therapy

## 2020-06-28 ENCOUNTER — Other Ambulatory Visit: Payer: Self-pay

## 2020-06-28 DIAGNOSIS — R2689 Other abnormalities of gait and mobility: Secondary | ICD-10-CM

## 2020-06-28 DIAGNOSIS — S76111A Strain of right quadriceps muscle, fascia and tendon, initial encounter: Secondary | ICD-10-CM

## 2020-06-28 DIAGNOSIS — M79604 Pain in right leg: Secondary | ICD-10-CM

## 2020-06-28 DIAGNOSIS — M62838 Other muscle spasm: Secondary | ICD-10-CM

## 2020-06-28 DIAGNOSIS — M6281 Muscle weakness (generalized): Secondary | ICD-10-CM

## 2020-06-28 DIAGNOSIS — M25551 Pain in right hip: Secondary | ICD-10-CM

## 2020-06-28 NOTE — Therapy (Signed)
[facility] [address] [place], [place], [address] Phone: [phone]   Fax:  [phone]  Physical Therapy Treatment  Patient Details  Name: Christina [name] MRN: [id number] Date of Birth: 1950-07-22 Referring Provider (PT): Lyndal Pulley, DO   Encounter Date: 06/28/2020   PT End of Session - 06/28/20 1100    Visit Number 8    Date for PT Re-Evaluation 07/06/20    Authorization Type [employer]    Progress Note Due on Visit 10    PT Start Time 1017    PT Stop Time 1100    PT Time Calculation (min) 43 min    Activity Tolerance Patient tolerated treatment well    Behavior During Therapy [facility] for tasks assessed/performed           Past Medical History:  Diagnosis Date  . Abrasion of right leg 12/17/2014  . Adrenal insufficiency ([place])   . Breast cancer of upper-outer quadrant of left female breast ([place]) 12/07/2014  . Cataract, immature    bilateral  . Dental bridge present    upper - x 2  . Dental crowns present   . Hypothyroidism   . Personal history of radiation therapy   . Prediabetes     Past Surgical History:  Procedure Laterality Date  . ABDOMINOPLASTY    . BALLOON SINUPLASTY    . BREAST LUMPECTOMY Left 2016  . BREAST LUMPECTOMY WITH RADIOACTIVE SEED AND SENTINEL LYMPH NODE BIOPSY Left 12/22/2014   Procedure: BREAST LUMPECTOMY WITH RADIOACTIVE SEED AND SENTINEL LYMPH NODE BIOPSY;  Surgeon: Stark Klein, MD;  Location: [facility];  Service: General;  Laterality: Left;  . BUNIONECTOMY Right   . HAMMER TOE SURGERY Bilateral   . RHINOPLASTY    . SEPTOPLASTY      There were no vitals filed for this visit.   Subjective Assessment - 06/28/20 1019    Subjective I have been cutting back last week on HEP b/c I get sore.  First thing in the morning I'm the worst.    Pertinent History EMG scheduled for 05/26/20    How long can you walk comfortably? cannot walk comfortably     Diagnostic tests MRI Rt hip 08/19/19: mod degen, spurring, superior labrum degen/torn, MRI lumbar 2018 multi-level disc dessication and lateral stenosis, right foraminal disc extrusion L3/4 with right L3 nerve root impingement    Patient Stated Goals be able to walk    Currently in Pain? No/denies              [facility] PT Assessment - 06/28/20 0001      AROM   AROM Assessment Site Knee    Right/Left Knee Right      Flexibility   Quadriceps prone 105 deg on Rt      High Level Balance   High Level Balance Comments colored discs on floor: step over short cone toheel strike on colored disc Rt LE x 6 passes      Standardized Balance Assessment   Five times sit to stand comments  2 trials: 21 sec, then 13 sec, much improved symmetrical use of LEs and control through Rt LE      Timed Up and Go Test   TUG Normal TUG    Normal TUG (seconds) 13    TUG Comments 2-step 180 turns, close to pivot turn no LOB  [facility] Adult PT Treatment/Exercise - 06/28/20 0001      Knee/Hip Exercises: Stretches   Quad Stretch Right;60 seconds    Quad Stretch Limitations prone with Lt LE giving overpressure and using contract/relax into own leg    Other Knee/Hip Stretches Thomas test stretch for ITB and quad on Rt off edge of table with Lt knee hug      Knee/Hip Exercises: Standing   Forward Step Up Right;2 sets;10 reps;Step Height: 6"    Forward Step Up Limitations march tap Lt LE with cue to use open hands on counter vs closed to avoid pulling, TC for knee straight and stand tall through hip    Gait Training along counter exagerrated Rt LE fwd/bwd stepping strategy fwd/bwd with weight shift with hip flexion march                    PT Short Term Goals - 06/28/20 1108      PT SHORT TERM GOAL #1   Title Pt will be ind with initial HEP    Status Achieved      PT SHORT TERM GOAL #2   Title Pt will be able to perform 10 reps of SLR on Rt LE to demo improved  Rt hip flexor strength    Status Achieved      PT SHORT TERM GOAL #3   Title Pt will be able to perform step through gait with consistent heel strike on Rt LE x 40 feet    Baseline 30 feet without verbal cueing    Status On-going             PT Long Term Goals - 06/28/20 1059      PT LONG TERM GOAL #1   Title Pt with achieve Rt hip flexor strength of at least 4/5 for improved daily task endurance and ambulation.    Status On-going      PT LONG TERM GOAL #2   Title Pt will perform 5x sit to stand without compensation of thighs pushing back on table in 12 sec or less to demo reduced fall risk and improved transfers.    Baseline 13 sec, good symmetry    Status On-going      PT LONG TERM GOAL #3   Title Pt will perform TUG in 12 sec or less with pivot turn without LOB to demo reduced fall risk    Baseline 13 sec, 2-pivot turn, no LOB    Status On-going      PT LONG TERM GOAL #4   Title Pt will be able to demo gait symmetry in a 3 min walk test with min shuffling of Rt LE    Status New      PT LONG TERM GOAL #5   Title Pt will be ind with advanced HEP    Status On-going                 Plan - 06/28/20 1101    Clinical Impression Statement Pt has nearly met 2 LTGs for 5x sit to stand (13 sec, goal 12 sec) and TUG test (13 sec using only a 2-step pivot turn over 2 trials) today.  PT added further focused Rt quad stretching using Thomas Test position geared for quad and ITB stretch along with prone quad stretch with contralateral LE to provide overpressure with contract/relax.  Pt needs repeated verbal cues to maintain improved Rt LE gait pattern for flexion from extented LE and heel strike but is  able to perform with high reps today.  Prone knee flexion ROM on Rt is 105 deg today with signif soft tissue stretch end feel.  Continue along POC with ongoing cueing during gait training tasks.    PT Frequency 1x / week    PT Duration 8 weeks    PT Treatment/Interventions  ADLs/Self Care Home Management;Electrical Stimulation;Neuromuscular re-education;Therapeutic exercise;Functional mobility training;Stair training;Gait training;Therapeutic activities;Manual techniques;Patient/family education;Dry needling;Passive range of motion;Joint Manipulations;Spinal Manipulations    PT Next Visit Plan review new quad stretches added last time, continue gait training for Rt LE hip flexion and heel strike control, pivot turns/changes of direction, work on steps    PT Home Exercise Plan Access Code: [email or web address]    Consulted and Agree with Plan of Care Patient           Patient will benefit from skilled therapeutic intervention in order to improve the following deficits and impairments:     Visit Diagnosis: Other abnormalities of gait and mobility  Muscle weakness (generalized)  Pain in right hip  Pain in right leg  Other muscle spasm  Strain of right quadriceps, initial encounter     Problem List Patient Active Problem List   Diagnosis Date Noted  . Arthritis of right hip 09/17/2019  . Labral tear of right hip joint 08/06/2019  . Gluteal tendonitis of right buttock 05/22/2019  . Tear of left gluteus medius tendon 01/04/2019  . Arthritis of left hip 03/20/2018  . Greater trochanteric bursitis of right hip 06/13/2017  . 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 Breast cancer of upper-outer quadrant of left female breast ([place]) 12/07/2014  . Acute recurrent sinusitis 01/17/2013    Alene Mires Corisa Montini 06/28/2020, 11:08 AM  [facility] [address], [place], [place], [address] Phone: [phone]   Fax:  [phone]  Name: Christina [name] MRN: [id number] Date of Birth: 1950/09/26

## 2020-06-28 NOTE — Patient Instructions (Signed)
Access Code: [email or web address] URL: [email or web address] Date: 06/28/2020 Prepared by: Venetia Night Feven Alderfer  Exercises Standing Quadriceps Stretch - 1 x daily - 7 x weekly - 1 sets - 2 reps - 30 hold Standing Hip Flexion with Counter Support - 1 x daily - 7 x weekly - 3 sets - 5 reps Standing March with Counter Support - 1 x daily - 7 x weekly - 3 sets - 5 reps Seated March - 1 x daily - 7 x weekly - 3 sets - 5 reps Supine Hip Internal and External Rotation - 1 x daily - 7 x weekly - 1 sets - 20 reps Supine Piriformis Stretch with Leg Straight - 2 x daily - 7 x weekly - 1 sets - 3 reps - 20-30 hold Seated Hamstring Stretch - 2 x daily - 7 x weekly - 1 sets - 3 reps - 20-30 hold Standing Hip Abduction with Counter Support - 1 x daily - 7 x weekly - 2 sets - 10 reps Standing Hip Extension with Counter Support - 1 x daily - 7 x weekly - 2 sets - 10 reps Step Taps on High Step - 1 x daily - 7 x weekly - 2 sets - 10 reps Supine Active Straight Leg Raise - 2 x daily - 7 x weekly - 2 sets - 5 reps Supine Transversus Abdominis Bracing - Hands on Stomach - 1 x daily - 7 x weekly - 1 sets - 10 reps - 5 hold Seated Transversus Abdominis Bracing - 1 x daily - 7 x weekly - 1 sets - 10 reps - 5 hold Supine March - 2 x daily - 7 x weekly - 1 sets - 10 reps - 5 hold Thomas Stretch on Table - 1 x daily - 7 x weekly - 1 sets - 1 reps - 60 hold Prone Knee Flexion AAROM with Overpressure - 1 x daily - 7 x weekly - 1 sets - 3 reps - 10 hold

## 2020-07-06 ENCOUNTER — Other Ambulatory Visit: Payer: Self-pay

## 2020-07-06 ENCOUNTER — Ambulatory Visit: Payer: PPO | Attending: Family Medicine

## 2020-07-06 DIAGNOSIS — S76111A Strain of right quadriceps muscle, fascia and tendon, initial encounter: Secondary | ICD-10-CM | POA: Diagnosis not present

## 2020-07-06 DIAGNOSIS — M6281 Muscle weakness (generalized): Secondary | ICD-10-CM | POA: Insufficient documentation

## 2020-07-06 DIAGNOSIS — M79604 Pain in right leg: Secondary | ICD-10-CM | POA: Insufficient documentation

## 2020-07-06 DIAGNOSIS — R2689 Other abnormalities of gait and mobility: Secondary | ICD-10-CM | POA: Diagnosis not present

## 2020-07-06 DIAGNOSIS — M62838 Other muscle spasm: Secondary | ICD-10-CM | POA: Diagnosis not present

## 2020-07-06 DIAGNOSIS — M25551 Pain in right hip: Secondary | ICD-10-CM | POA: Insufficient documentation

## 2020-07-06 NOTE — Therapy (Signed)
[facility] [address], [place], [place], [address] Phone: [phone]   Fax:  [phone]  Physical Therapy Treatment  Patient Details  Name: Christina [name] MRN: [id number] Date of Birth: May 04, 1950 Referring Provider (PT): Lyndal Pulley, DO   Encounter Date: 07/06/2020   PT End of Session - 07/06/20 1231    Visit Number 9    Date for PT Re-Evaluation 08/31/20    Authorization Type [employer]    Progress Note Due on Visit 10    PT Start Time 1018    PT Stop Time 1059    PT Time Calculation (min) 41 min    Activity Tolerance Patient tolerated treatment well    Behavior During Therapy [facility] for tasks assessed/performed           Past Medical History:  Diagnosis Date  . Abrasion of right leg 12/17/2014  . Adrenal insufficiency ([place])   . Breast cancer of upper-outer quadrant of left female breast ([place]) 12/07/2014  . Cataract, immature    bilateral  . Dental bridge present    upper - x 2  . Dental crowns present   . Hypothyroidism   . Personal history of radiation therapy   . Prediabetes     Past Surgical History:  Procedure Laterality Date  . ABDOMINOPLASTY    . BALLOON SINUPLASTY    . BREAST LUMPECTOMY Left 2016  . BREAST LUMPECTOMY WITH RADIOACTIVE SEED AND SENTINEL LYMPH NODE BIOPSY Left 12/22/2014   Procedure: BREAST LUMPECTOMY WITH RADIOACTIVE SEED AND SENTINEL LYMPH NODE BIOPSY;  Surgeon: Stark Klein, MD;  Location: [facility];  Service: General;  Laterality: Left;  . BUNIONECTOMY Right   . HAMMER TOE SURGERY Bilateral   . RHINOPLASTY    . SEPTOPLASTY      There were no vitals filed for this visit.   Subjective Assessment - 07/06/20 1017    Subjective I have had some good days.  Yesterday was bad and it might have been because I didn't do the quad stretch.  I tried to walk outside and I fell down twice.    Currently in Pain? No/denies              [facility] PT  Assessment - 07/06/20 0001      Assessment   Medical Diagnosis R29.898 (ICD-10-CM) - Right leg weakness M16.11 (ICD-10-CM) - Arthritis of right hip S73.191A (ICD-10-CM) - Tear of right acetabular labrum, initial encounter    Referring Provider (PT) Lyndal Pulley, DO    Hand Dominance Right      Precautions   Precautions Fall    Precaution Comments due to weakness, no recent history of falls      Balance Screen   Has the patient fallen in the past 6 months Yes    How many times? 2   working on balance   Has the patient had a decrease in activity level because of a fear of falling?  Yes      Prior Function   Level of Independence Independent      Cognition   Overall Cognitive Status Within Functional Limits for tasks assessed      Posture/Postural Control   Posture/Postural Control Postural limitations    Postural Limitations Decreased lumbar lordosis      AROM   Right Knee Flexion 105   prone     Flexibility   Quadriceps prone 105 deg on Rt      Transfers  Five time sit to stand comments  13 seconds- good symmetry      Ambulation/Gait   Ambulation/Gait Yes    Ambulation/Gait Assistance 6: Modified independent (Device/Increase time)    Gait Pattern Step-to pattern;Step-through pattern    Gait Comments emphasis on initiation of hip flexion and clearance of Rt foot-max verbal cues- multiple episodes of Rt leg catching on the ground.      Timed Up and Go Test   TUG Normal TUG    Normal TUG (seconds) 13    TUG Comments 2-step 180 turns, close to pivot turn no LOB                         [facility] Adult PT Treatment/Exercise - 07/06/20 0001      High Level Balance   High Level Balance Comments colored discs on floor: step over short cone toheel strike on colored disc Rt LE x 6 passes   max berbal cues and then reduced cueing     Knee/Hip Exercises: Stretches   Active Hamstring Stretch Right;20 seconds;2 reps    Piriformis Stretch Right;Left;1 rep;30  seconds      Knee/Hip Exercises: Seated   Marching Strengthening;Both;2 sets;10 reps    Marching Limitations holding 5# kettle bell on thigh      Knee/Hip Exercises: Supine   Straight Leg Raises Strengthening;AROM;2 sets;5 reps    Straight Leg Raises Limitations --   verbal cues for quad activation     Knee/Hip Exercises: Prone   Other Prone Exercises A/AROM flexion with Lt LE to assist      Manual Therapy   Manual Therapy Soft tissue mobilization;Passive ROM    Manual therapy comments Addaday to Rt quad and gentle quad stretch in prone    Passive ROM IR of Rt hip with long axis and knee flexion in prone to tolerance                    PT Short Term Goals - 06/28/20 1108      PT SHORT TERM GOAL #1   Title Pt will be ind with initial HEP    Status Achieved      PT SHORT TERM GOAL #2   Title Pt will be able to perform 10 reps of SLR on Rt LE to demo improved Rt hip flexor strength    Status Achieved      PT SHORT TERM GOAL #3   Title Pt will be able to perform step through gait with consistent heel strike on Rt LE x 40 feet    Baseline 30 feet without verbal cueing    Status On-going             PT Long Term Goals - 07/06/20 1055      PT LONG TERM GOAL #1   Title Pt with achieve Rt hip flexor strength of at least 4/5 for improved daily task endurance and ambulation.    Baseline not formally tested- fair quad contraction with SLR and fatigue after 3 reps of SLR on the Rt    Time 8    Period Weeks    Status On-going    Target Date 08/31/20      PT LONG TERM GOAL #2   Title Pt will perform 5x sit to stand without compensation of thighs pushing back on table in 12 sec or less to demo reduced fall risk and improved transfers.    Baseline 13 seconds-good symmetry  Time 8    Period Weeks    Status On-going    Target Date 08/31/20      PT LONG TERM GOAL #3   Title Pt will perform TUG in 12 sec or less with pivot turn without LOB to demo reduced fall risk     Baseline 13 sec, 2-pivot turn, no LOB    Time 8    Period Weeks    Status On-going    Target Date 08/31/20      PT LONG TERM GOAL #4   Title Pt will be able to demo gait symmetry in a 3 min walk test with min shuffling of Rt LE    Baseline able to do for short distances    Time 8    Period Weeks    Status On-going    Target Date 08/31/20      PT LONG TERM GOAL #5   Title Pt will be ind with advanced HEP    Time 8    Period Weeks    Status On-going    Target Date 08/31/20                 Plan - 07/06/20 1053    Clinical Impression Statement Pt with 2 falls when trying to walk outside this weekend.  Pt with significant fatigue of Rt hip flexors with exercise today.  Tactile and verbal cueing for hip flexor and quad activation with exercise today.  Pt able to perform 2 sets of 5 straight leg raises independently although significant fatigue at end of each set. Pt rests in Rt hip ER and PT encouraged gluteal stretches at home to help with this.  Significant Rt quad tension and limited Rt knee flexion.  Pt needs repeated verbal cues to maintain improved Rt LE gait pattern for flexion from extended LE and heel strike but is able to perform with high reps today. Pt is making very slow progress due to chronicity of condition.  Pt has EMG scheduled for 08/31/20.  Continue along POC with ongoing cueing during gait training tasks.    PT Frequency 1x / week    PT Duration 8 weeks    PT Treatment/Interventions ADLs/Self Care Home Management;Electrical Stimulation;Neuromuscular re-education;Therapeutic exercise;Functional mobility training;Stair training;Gait training;Therapeutic activities;Manual techniques;Patient/family education;Dry needling;Passive range of motion;Joint Manipulations;Spinal Manipulations    PT Next Visit Plan continue gait training for Rt LE hip flexion and heel strike control, pivot turns/changes of direction, work on steps    PT Home Exercise Plan Access Code: [email or web address]     Recommended Other Services recert sent 5/0/53    Consulted and Agree with Plan of Care Patient           Patient will benefit from skilled therapeutic intervention in order to improve the following deficits and impairments:  Abnormal gait,Decreased strength,Decreased endurance,Impaired flexibility,Postural dysfunction,Pain,Difficulty walking,Decreased range of motion  Visit Diagnosis: Muscle weakness (generalized) - Plan: PT plan of care cert/re-cert  Other abnormalities of gait and mobility - Plan: PT plan of care cert/re-cert  Pain in right hip - Plan: PT plan of care cert/re-cert  Pain in right leg - Plan: PT plan of care cert/re-cert  Other muscle spasm - Plan: PT plan of care cert/re-cert  Strain of right quadriceps, initial encounter - Plan: PT plan of care cert/re-cert     Problem List Patient Active Problem List   Diagnosis Date Noted  . Arthritis of right hip 09/17/2019  . Labral tear of right hip joint 08/06/2019  .  Gluteal tendonitis of right buttock 05/22/2019  . Tear of left gluteus medius tendon 01/04/2019  . Arthritis of left hip 03/20/2018  . Greater trochanteric bursitis of right hip 06/13/2017  . 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 Breast cancer of upper-outer quadrant of left female breast ([place]) 12/07/2014  . Acute recurrent sinusitis 01/17/2013     Sigurd Sos, PT 07/06/20 12:34 PM  [facility] [address] [place], [place], [address] Phone: [phone]   Fax:  [phone]  Name: SHONNA DEITER MRN: [id number] Date of Birth: Oct 05, 1950

## 2020-07-07 ENCOUNTER — Encounter: Payer: PPO | Admitting: Neurology

## 2020-07-07 NOTE — Progress Notes (Signed)
Christina [name] is a 70 y.o. female who presents to [facility] today for f/u of R quad/thigh pain and weakness and gait instability.  She was last seen by Dr. Georgina Snell on 05/27/20 and noted con't issues w/ R quad/thigh weakness.  She has had multiple falls due to weakness and general instability in her R leg.  She was referred to PT and has completed 9 sessions.  She has had an extensive workup and treatment for these symptoms including having had 2 epidurals to R L3 and a R L3 nerve root block (03/31/20 and 03/10/20).  She was also referred to neurology for an EMG/NCV at her last visit w/ Dr. Tamala Julian and is scheduled for this test on 08/31/20.  Since her last visit w/ Dr. Georgina Snell, pt reports that she feels like PT is helping a little.  She states that some days are better than others but today is not a good day.  Diagnostic testing: L-spine MRI- 03/06/20; L-spine XR- 02/25/20  Pertinent review of systems: No fevers or chills  Relevant historical information: History of breast cancer   Exam:  BP (!) 148/80 (BP Location: Right Arm, Patient Position: Sitting, Cuff Size: Normal)   Ht 5\' 3"  (1.6 m)   Wt 121 lb (54.9 kg)   BMI 21.43 kg/m  General: Well Developed, well nourished, and in no acute distress.   MSK: L-spine normal-appearing Nontender midline. Lower extremity strength diminished hip flexion right 4/5 compared to left.  Lennie Hummer strength is intact otherwise.     Lab and Radiology Results EXAM: MRI LUMBAR SPINE WITHOUT CONTRAST  TECHNIQUE: Multiplanar, multisequence MR imaging of the lumbar spine was performed. No intravenous contrast was administered.  COMPARISON:  Lumbar spine radiographs 02/25/2020. Lumbar spine MRI 11/02/2016.  FINDINGS: Segmentation: For the purposes of this dictation, five lumbar vertebrae are assumed and the caudal most well-formed intervertebral disc is designated L5-S1. Spinal numbering will remain consistent with  that utilized on the prior MRI of 11/02/2016.  Alignment: Straightening of the expected lumbar lordosis. No significant spondylolisthesis.  Vertebrae: Vertebral body height is maintained. No significant marrow edema or focal suspicious osseous lesion.  Conus medullaris and cauda equina: Conus extends to the T12-L1 level. No signal abnormality within the visualized distal spinal cord.  Paraspinal and other soft tissues: No abnormality identified within included portions of the abdomen/retroperitoneum. Paraspinal soft tissues within normal limits.  Disc levels:  Unless otherwise stated, the level by level findings below have not significantly changed since prior MRI 11/02/2016.  Congenitally narrow lumbar spinal canal on the basis of short pedicles.  Multilevel disc degeneration. Most notably, there is mild/moderate disc degeneration at L2-L3 and L3-L4.  T12-L1: No significant disc herniation or stenosis.  L1-L2: A previously described 4 mm T2 hyperintense lesion within the right extraforaminal region is no longer definitively appreciated. Minimal disc bulge. Mild facet arthrosis. No significant spinal canal or foraminal narrowing.  L2-L3: Disc bulge with endplate spurring. Superimposed small right foraminal disc protrusion. Mild facet arthrosis/ligamentum flavum hypertrophy. Mild/moderate bilateral subarticular narrowing without frank nerve root impingement. Mild relative narrowing of the central canal. Mild right neural foraminal narrowing.  L3-L4: Disc bulge. Superimposed right foraminal/extraforaminal cranially migrated disc extrusion. Mild facet arthrosis/ligamentum flavum hypertrophy. Mild/moderate bilateral subarticular stenosis without frank nerve root impingement. Mild relative narrowing of the central canal. As before, the disc extrusion contributes to severe right neural foraminal narrowing with encroachment upon the exiting right L3 nerve root.  Mild/moderate left neural foraminal  narrowing.  L4-L5: Disc bulge with mild endplate spurring. Mild facet arthrosis. Mild bilateral subarticular narrowing without nerve root impingement. Minimal relative narrowing of the central canal. Mild right neural foraminal narrowing. Moderate to moderately severe left neural foraminal narrowing, slightly progressed.  L5-S1: Minimal facet arthrosis. No significant disc herniation or stenosis.  IMPRESSION: Lumbar spondylosis as outlined with findings most notably as follows. Apart from slight progression of moderate to moderately severe left L4-L5 neural foraminal narrowing, findings have not significantly changed since the MRI of 11/02/2016  At L3-L4, there is mild/moderate disc degeneration. Unchanged right foraminal/extraforaminal cranially migrated disc extrusion contributing to severe right neural foraminal narrowing, approaching upon the exiting right L3 nerve root. Multifactorial mild/moderate bilateral subarticular narrowing without frank nerve root impingement. Mild/moderate left neural foraminal narrowing.  At L2-L3, there is mild/moderate disc degeneration. Multifactorial mild/moderate bilateral subarticular narrowing without frank nerve root impingement. Mild right neural foraminal narrowing.  At L4-L5, there is multifactorial moderate to moderately severe left neural foraminal narrowing, slightly progressed. Multifactorial mild spinal canal and right neural foraminal narrowing.   Electronically Signed   By: Kellie Simmering DO   On: 03/07/2020 15:49  I, Lynne Leader, personally (independently) visualized and performed the interpretation of the images attached in this note.     Assessment and Plan: 70 y.o. female with frequent falls attributable to right hip flexor weakness secondary to L3 radiculopathy.  Patient is improving with physical therapy but still having weakness.  She has a nerve conduction study scheduled for  May 3.  Since she is improving a bit and not having escalating falls or worsening symptoms I think is okay to wait until after the nerve conduction study.  Next step would be referral to orthopedic spine surgery or neurosurgery to discuss surgical options.  She is reluctant to consider surgery at this time.  However she continues to have falls and weakness I do not see much other options.  She has already had epidural steroid injections with little benefit.    Discussed warning signs or symptoms. Please see discharge instructions. Patient expresses understanding.   The above documentation has been reviewed and is accurate and complete Lynne Leader, M.D. Total encounter time 20 minutes including face-to-face time with the patient and, reviewing past medical record, and charting on the date of service.   Treatment plan and options.

## 2020-07-08 ENCOUNTER — Ambulatory Visit: Payer: PPO | Admitting: Family Medicine

## 2020-07-08 ENCOUNTER — Other Ambulatory Visit: Payer: Self-pay

## 2020-07-08 ENCOUNTER — Encounter: Payer: Self-pay | Admitting: Family Medicine

## 2020-07-08 VITALS — BP 148/80 | Ht 63.0 in | Wt 121.0 lb

## 2020-07-08 DIAGNOSIS — R296 Repeated falls: Secondary | ICD-10-CM | POA: Diagnosis not present

## 2020-07-08 DIAGNOSIS — S3421XD Injury of nerve root of lumbar spine, subsequent encounter: Secondary | ICD-10-CM

## 2020-07-08 DIAGNOSIS — R269 Unspecified abnormalities of gait and mobility: Secondary | ICD-10-CM

## 2020-07-08 NOTE — Patient Instructions (Signed)
Thank you for coming in today.  Recheck with me following the nerve test.   Work on your exercises.   If you worsen let me know.  Next step is consultation with a spine surgeon probably Dr Lynann Bologna.

## 2020-07-12 DIAGNOSIS — Z85828 Personal history of other malignant neoplasm of skin: Secondary | ICD-10-CM | POA: Diagnosis not present

## 2020-07-12 DIAGNOSIS — C4442 Squamous cell carcinoma of skin of scalp and neck: Secondary | ICD-10-CM | POA: Diagnosis not present

## 2020-07-12 DIAGNOSIS — C44722 Squamous cell carcinoma of skin of right lower limb, including hip: Secondary | ICD-10-CM | POA: Diagnosis not present

## 2020-07-13 ENCOUNTER — Other Ambulatory Visit: Payer: Self-pay

## 2020-07-13 ENCOUNTER — Ambulatory Visit: Payer: PPO

## 2020-07-13 DIAGNOSIS — M79604 Pain in right leg: Secondary | ICD-10-CM

## 2020-07-13 DIAGNOSIS — M25551 Pain in right hip: Secondary | ICD-10-CM

## 2020-07-13 DIAGNOSIS — M6281 Muscle weakness (generalized): Secondary | ICD-10-CM | POA: Diagnosis not present

## 2020-07-13 DIAGNOSIS — M62838 Other muscle spasm: Secondary | ICD-10-CM

## 2020-07-13 DIAGNOSIS — R2689 Other abnormalities of gait and mobility: Secondary | ICD-10-CM

## 2020-07-13 DIAGNOSIS — S76111A Strain of right quadriceps muscle, fascia and tendon, initial encounter: Secondary | ICD-10-CM

## 2020-07-13 NOTE — Therapy (Signed)
[facility] [address], [place], [place], [address] Phone: [phone]   Fax:  [phone]  Physical Therapy Treatment  Patient Details  Name: ENAS WINCHEL MRN: [id number] Date of Birth: 07-28-1950 Referring Provider (PT): Lyndal Pulley, DO   Encounter Date: 07/13/2020 Progress Note Reporting Period 05/11/20 to 07/13/20  See note below for Objective Data and Assessment of Progress/Goals.       PT End of Session - 07/13/20 1101    Visit Number 10    Date for PT Re-Evaluation 08/31/20    Authorization Type [employer]    Progress Note Due on Visit 20    PT Start Time 1017    PT Stop Time 1056    PT Time Calculation (min) 39 min    Activity Tolerance Patient tolerated treatment well    Behavior During Therapy WFL for tasks assessed/performed           Past Medical History:  Diagnosis Date  . Abrasion of right leg 12/17/2014  . Adrenal insufficiency ([place])   . Breast cancer of upper-outer quadrant of left female breast ([place]) 12/07/2014  . Cataract, immature    bilateral  . Dental bridge present    upper - x 2  . Dental crowns present   . Hypothyroidism   . Personal history of radiation therapy   . Prediabetes     Past Surgical History:  Procedure Laterality Date  . ABDOMINOPLASTY    . BALLOON SINUPLASTY    . BREAST LUMPECTOMY Left 2016  . BREAST LUMPECTOMY WITH RADIOACTIVE SEED AND SENTINEL LYMPH NODE BIOPSY Left 12/22/2014   Procedure: BREAST LUMPECTOMY WITH RADIOACTIVE SEED AND SENTINEL LYMPH NODE BIOPSY;  Surgeon: Stark Klein, MD;  Location: [place];  Service: General;  Laterality: Left;  . BUNIONECTOMY Right   . HAMMER TOE SURGERY Bilateral   . RHINOPLASTY    . SEPTOPLASTY      There were no vitals filed for this visit.   Subjective Assessment - 07/13/20 1020    Subjective My leg has been good.  I moved my nerve test to the end of this month.    Currently in  Pain? No/denies              [facility] PT Assessment - 07/13/20 0001      Assessment   Medical Diagnosis R29.898 (ICD-10-CM) - Right leg weakness M16.11 (ICD-10-CM) - Arthritis of right hip S73.191A (ICD-10-CM) - Tear of right acetabular labrum, initial encounter    Referring Provider (PT) Lyndal Pulley, DO    Hand Dominance Right      Prior Function   Level of Independence Independent      Cognition   Overall Cognitive Status Within Functional Limits for tasks assessed      Posture/Postural Control   Posture/Postural Control Postural limitations    Postural Limitations Decreased lumbar lordosis      AROM   Right Knee Flexion 105   prone     Ambulation/Gait   Ambulation/Gait Yes    Ambulation/Gait Assistance 6: Modified independent (Device/Increase time)    Gait Pattern Step-to pattern;Step-through pattern    Gait Comments emphasis on initiation of hip flexion and clearance of Rt foot-max verbal cues- multiple episodes of Rt leg catching on the ground.      Timed Up and Go Test   TUG Normal TUG    Normal TUG (seconds) 13  [facility] Adult PT Treatment/Exercise - 07/13/20 0001      Transfers   Five time sit to stand comments  13 seconds- good symmetry      High Level Balance   High Level Balance Comments colored discs on floor: low cone on top :forward hip flexion to tap on top of cone and flexion to abduction to step sideways to step on cone 2x10   maxberbal cues and then reduced cueing     Knee/Hip Exercises: Stretches   Piriformis Stretch Right;Left;1 rep;30 seconds      Knee/Hip Exercises: Standing   Rocker Board 2 minutes;4 minutes    Gait Training walking on level surface: hip flexion and heel strike emphasis on Rt- pt able to do this with 75% accuracy without visual cues and very minimal verbal cueing.      Knee/Hip Exercises: Seated   Marching Strengthening;Both;2 sets;10 reps    Marching Limitations holding 5# kettle bell  on thigh      Knee/Hip Exercises: Supine   Straight Leg Raises Strengthening;AROM;2 sets;10 reps    Straight Leg Raises Limitations --   verbal cues for quad activation     Manual Therapy   Manual Therapy Soft tissue mobilization;Passive ROM    Manual therapy comments Addaday to Rt quad and gentle quad stretch in prone    Passive ROM IR of Rt hip with long axis and knee flexion in prone to tolerance                    PT Short Term Goals - 06/28/20 1108      PT SHORT TERM GOAL #1   Title Pt will be ind with initial HEP    Status Achieved      PT SHORT TERM GOAL #2   Title Pt will be able to perform 10 reps of SLR on Rt LE to demo improved Rt hip flexor strength    Status Achieved      PT SHORT TERM GOAL #3   Title Pt will be able to perform step through gait with consistent heel strike on Rt LE x 40 feet    Baseline 30 feet without verbal cueing    Status On-going             PT Long Term Goals - 07/13/20 1100      PT LONG TERM GOAL #1   Title Pt with achieve Rt hip flexor strength of at least 4/5 for improved daily task endurance and ambulation.    Baseline not formally tested- fair quad contraction with SLR and fatigue after 10 reps of SLR on the Rt with min quad lag    Status On-going      PT LONG TERM GOAL #2   Title Pt will perform 5x sit to stand without compensation of thighs pushing back on table in 12 sec or less to demo reduced fall risk and improved transfers.    Baseline 13 seconds-good symmetry    Status On-going      PT LONG TERM GOAL #3   Title Pt will perform TUG in 12 sec or less with pivot turn without LOB to demo reduced fall risk    Baseline 13 sec, 2-pivot turn, no LOB    Status On-going      PT LONG TERM GOAL #4   Title Pt will be able to demo gait symmetry in a 3 min walk test with min shuffling of Rt LE    Baseline able to  do for short distances    Status On-going      PT LONG TERM GOAL #5   Title Pt will be ind with advanced  HEP    Status On-going                 Plan - 07/13/20 1039    Clinical Impression Statement No falls since last session.  Pt changed her nerve study test to the end of this month.   Pt with significant fatigue of Rt hip flexors with exercise today.  Tactile and verbal cueing for hip flexor and quad activation with exercise today.  Pt able to perform 2 sets of 10 straight leg raises independently although significant fatigue at end of each set. Pt with improved hip alignment in supine today and is able to achieve neutral hip rotation with increased ease.  Significant Rt quad tension and limited Rt knee flexion. Rt quad tension is improved overall today with reduced pain with manual work.   Pt needs repeated verbal cues to maintain improved Rt LE gait pattern for flexion from extended LE and heel strike but is able to perform with high reps today. Pt is making very slow progress due to chronicity of condition.    PT Frequency 1x / week    PT Duration 8 weeks    PT Treatment/Interventions ADLs/Self Care Home Management;Electrical Stimulation;Neuromuscular re-education;Therapeutic exercise;Functional mobility training;Stair training;Gait training;Therapeutic activities;Manual techniques;Patient/family education;Dry needling;Passive range of motion;Joint Manipulations;Spinal Manipulations    PT Next Visit Plan continue gait training for Rt LE hip flexion and heel strike control, pivot turns/changes of direction, work on steps    PT Home Exercise Plan Access Code: [email or web address]    Recommended Other Services initial cert is signed    Consulted and Agree with Plan of Care Patient           Patient will benefit from skilled therapeutic intervention in order to improve the following deficits and impairments:  Abnormal gait,Decreased strength,Decreased endurance,Impaired flexibility,Postural dysfunction,Pain,Difficulty walking,Decreased range of motion  Visit Diagnosis: Other abnormalities of gait and  mobility  Muscle weakness (generalized)  Pain in right hip  Pain in right leg  Other muscle spasm  Strain of right quadriceps, initial encounter     Problem List Patient Active Problem List   Diagnosis Date Noted  . Arthritis of right hip 09/17/2019  . Labral tear of right hip joint 08/06/2019  . Gluteal tendonitis of right buttock 05/22/2019  . Tear of left gluteus medius tendon 01/04/2019  . Arthritis of left hip 03/20/2018  . Greater trochanteric bursitis of right hip 06/13/2017  . 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 Breast cancer of upper-outer quadrant of left female breast ([place]) 12/07/2014  . Acute recurrent sinusitis 01/17/2013    Sigurd Sos, PT 07/13/20 11:03 AM  [facility] [address], [place], [place], [address] Phone: [phone]   Fax:  [phone]  Name: CHARMAYNE ODELL MRN: [id number] Date of Birth: 26-Aug-1950

## 2020-07-20 ENCOUNTER — Other Ambulatory Visit: Payer: Self-pay

## 2020-07-20 ENCOUNTER — Ambulatory Visit: Payer: PPO

## 2020-07-20 DIAGNOSIS — M6281 Muscle weakness (generalized): Secondary | ICD-10-CM

## 2020-07-20 DIAGNOSIS — R2689 Other abnormalities of gait and mobility: Secondary | ICD-10-CM

## 2020-07-20 DIAGNOSIS — S76111A Strain of right quadriceps muscle, fascia and tendon, initial encounter: Secondary | ICD-10-CM

## 2020-07-20 DIAGNOSIS — M25551 Pain in right hip: Secondary | ICD-10-CM

## 2020-07-20 DIAGNOSIS — M62838 Other muscle spasm: Secondary | ICD-10-CM

## 2020-07-20 DIAGNOSIS — M79604 Pain in right leg: Secondary | ICD-10-CM

## 2020-07-20 NOTE — Therapy (Signed)
[facility] [address], [place], [place], [address] Phone: [phone]   Fax:  [phone]  Physical Therapy Treatment  Patient Details  Name: Christina [name] MRN: [id number] Date of Birth: Dec 02, 1950 Referring Provider (PT): Lyndal Pulley, DO   Encounter Date: 07/20/2020   PT End of Session - 07/20/20 1048    Visit Number 11    Date for PT Re-Evaluation 08/31/20    Authorization Type [employer]    Progress Note Due on Visit 20    PT Start Time 1016    PT Stop Time 1049    PT Time Calculation (min) 33 min    Activity Tolerance Patient limited by fatigue    Behavior During Therapy [facility] for tasks assessed/performed           Past Medical History:  Diagnosis Date  . Abrasion of right leg 12/17/2014  . Adrenal insufficiency ([place])   . Breast cancer of upper-outer quadrant of left female breast ([place]) 12/07/2014  . Cataract, immature    bilateral  . Dental bridge present    upper - x 2  . Dental crowns present   . Hypothyroidism   . Personal history of radiation therapy   . Prediabetes     Past Surgical History:  Procedure Laterality Date  . ABDOMINOPLASTY    . BALLOON SINUPLASTY    . BREAST LUMPECTOMY Left 2016  . BREAST LUMPECTOMY WITH RADIOACTIVE SEED AND SENTINEL LYMPH NODE BIOPSY Left 12/22/2014   Procedure: BREAST LUMPECTOMY WITH RADIOACTIVE SEED AND SENTINEL LYMPH NODE BIOPSY;  Surgeon: Stark Klein, MD;  Location: [place];  Service: General;  Laterality: Left;  . BUNIONECTOMY Right   . HAMMER TOE SURGERY Bilateral   . RHINOPLASTY    . SEPTOPLASTY      There were no vitals filed for this visit.   Subjective Assessment - 07/20/20 1019    Subjective I've had a horrible week.  I cant lift my Rt foot.    Diagnostic tests MRI Rt hip 08/19/19: mod degen, spurring, superior labrum degen/torn, MRI lumbar 2018 multi-level disc dessication and lateral stenosis, right foraminal  disc extrusion L3/4 with right L3 nerve root impingement    Patient Stated Goals be able to walk    Currently in Pain? No/denies                             [facility] Adult PT Treatment/Exercise - 07/20/20 0001      Ambulation/Gait   Gait Comments emphasis on initiation of hip flexion and clearance of Rt foot-max verbal cues- multiple episodes of Rt leg catching on the ground.      High Level Balance   High Level Balance Comments colored discs on floor: low cone on top :forward hip flexion to tap on top of cone and flexion to abduction to step sideways to step on cone 2x10   maxberbal cues and then reduced cueing     Knee/Hip Exercises: Stretches   Piriformis Stretch Right;Left;1 rep;30 seconds      Knee/Hip Exercises: Standing   Rocker Board 2 minutes    SLS Rt only 15 seconds with moderate UE support    Gait Training walking on level surface: hip flexion and heel strike emphasis on Rt- pt able to do this with 50-75 % accuracy without visual cues and minimal to moderate verbal cueing.      Knee/Hip Exercises: Seated  Marching Strengthening;Both;2 sets;10 reps    Marching Limitations holding 3#weight on thigh      Knee/Hip Exercises: Supine   Straight Leg Raises Strengthening;AROM;2 sets;10 reps    Straight Leg Raises Limitations --   verbal cues for quad activation     Manual Therapy   Manual Therapy Soft tissue mobilization;Passive ROM    Manual therapy comments Addaday to Rt quad and gentle quad stretch in prone    Passive ROM IR of Rt hip with long axis and knee flexion in prone to tolerance                    PT Short Term Goals - 06/28/20 1108      PT SHORT TERM GOAL #1   Title Pt will be ind with initial HEP    Status Achieved      PT SHORT TERM GOAL #2   Title Pt will be able to perform 10 reps of SLR on Rt LE to demo improved Rt hip flexor strength    Status Achieved      PT SHORT TERM GOAL #3   Title Pt will be able to perform step  through gait with consistent heel strike on Rt LE x 40 feet    Baseline 30 feet without verbal cueing    Status On-going             PT Long Term Goals - 07/13/20 1100      PT LONG TERM GOAL #1   Title Pt with achieve Rt hip flexor strength of at least 4/5 for improved daily task endurance and ambulation.    Baseline not formally tested- fair quad contraction with SLR and fatigue after 10 reps of SLR on the Rt with min quad lag    Status On-going      PT LONG TERM GOAL #2   Title Pt will perform 5x sit to stand without compensation of thighs pushing back on table in 12 sec or less to demo reduced fall risk and improved transfers.    Baseline 13 seconds-good symmetry    Status On-going      PT LONG TERM GOAL #3   Title Pt will perform TUG in 12 sec or less with pivot turn without LOB to demo reduced fall risk    Baseline 13 sec, 2-pivot turn, no LOB    Status On-going      PT LONG TERM GOAL #4   Title Pt will be able to demo gait symmetry in a 3 min walk test with min shuffling of Rt LE    Baseline able to do for short distances    Status On-going      PT LONG TERM GOAL #5   Title Pt will be ind with advanced HEP    Status On-going                 Plan - 07/20/20 1038    Clinical Impression Statement Pt reports that she has had a terrible week after last session with Rt LE fatigue and "catching" on the floor.  Pt will have nerve study test next week.  Pt with significant fatigue of Rt hip flexors with exercise today.  Tactile and verbal cueing for hip flexor and quad activation with exercise today.  Pt able to perform 3 sets of 5 straight leg raises independently although significant fatigue at end of each set. Session was shortened today due to extreme fatigue after last session and hip flexor  fatigue leading to imbalance since last treatment.  Pt was not as successful today with gait training as she was not wearing her AFO and was frustrated regarding Rt LE weakness.  Pt  is making very slow progress due to chronicity of condition.    PT Frequency 1x / week    PT Duration 8 weeks    PT Treatment/Interventions ADLs/Self Care Home Management;Electrical Stimulation;Neuromuscular re-education;Therapeutic exercise;Functional mobility training;Stair training;Gait training;Therapeutic activities;Manual techniques;Patient/family education;Dry needling;Passive range of motion;Joint Manipulations;Spinal Manipulations    PT Next Visit Plan continue gait training for Rt LE hip flexion and heel strike control, pivot turns/changes of direction    PT Home Exercise Plan Access Code: [email or web address]    Consulted and Agree with Plan of Care Patient           Patient will benefit from skilled therapeutic intervention in order to improve the following deficits and impairments:  Abnormal gait,Decreased strength,Decreased endurance,Impaired flexibility,Postural dysfunction,Pain,Difficulty walking,Decreased range of motion  Visit Diagnosis: Other abnormalities of gait and mobility  Muscle weakness (generalized)  Pain in right hip  Pain in right leg  Other muscle spasm  Strain of right quadriceps, initial encounter     Problem List Patient Active Problem List   Diagnosis Date Noted  . Arthritis of right hip 09/17/2019  . Labral tear of right hip joint 08/06/2019  . Gluteal tendonitis of right buttock 05/22/2019  . Tear of left gluteus medius tendon 01/04/2019  . Arthritis of left hip 03/20/2018  . Greater trochanteric bursitis of right hip 06/13/2017  . 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 Breast cancer of upper-outer quadrant of left female breast ([place]) 12/07/2014  . Acute recurrent sinusitis 01/17/2013    Sigurd Sos, PT 07/20/20 10:54 AM  [facility] [address] [place], [place], [address] Phone: [phone]   Fax:  [phone]  Name: Christina [name] MRN: [id number] Date of  Birth: 1950-11-28

## 2020-07-27 ENCOUNTER — Ambulatory Visit: Payer: PPO

## 2020-07-27 ENCOUNTER — Other Ambulatory Visit: Payer: Self-pay

## 2020-07-27 DIAGNOSIS — R2689 Other abnormalities of gait and mobility: Secondary | ICD-10-CM

## 2020-07-27 DIAGNOSIS — M25551 Pain in right hip: Secondary | ICD-10-CM

## 2020-07-27 DIAGNOSIS — M6281 Muscle weakness (generalized): Secondary | ICD-10-CM | POA: Diagnosis not present

## 2020-07-27 DIAGNOSIS — M62838 Other muscle spasm: Secondary | ICD-10-CM

## 2020-07-27 DIAGNOSIS — M79604 Pain in right leg: Secondary | ICD-10-CM

## 2020-07-27 NOTE — Therapy (Addendum)
[facility] [address] [place], [place], [address] Phone: [phone]   Fax:  [phone]  Physical Therapy Treatment  Patient Details  Name: Christina [name] MRN: [id number] Date of Birth: 1950-06-10 Referring Provider (PT): Lyndal Pulley, DO   Encounter Date: 07/27/2020   PT End of Session - 07/27/20 1044    Visit Number 12    Date for PT Re-Evaluation 08/31/20    Authorization Type [employer]    Progress Note Due on Visit 20    PT Start Time 1015    PT Stop Time 0300   pt only tolerates 30 minutes   PT Time Calculation (min) 32 min    Activity Tolerance Patient limited by fatigue    Behavior During Therapy [facility] for tasks assessed/performed           Past Medical History:  Diagnosis Date  . Abrasion of right leg 12/17/2014  . Adrenal insufficiency ([place])   . Breast cancer of upper-outer quadrant of left female breast ([employer]) 12/07/2014  . Cataract, immature    bilateral  . Dental bridge present    upper - x 2  . Dental crowns present   . Hypothyroidism   . Personal history of radiation therapy   . Prediabetes     Past Surgical History:  Procedure Laterality Date  . ABDOMINOPLASTY    . BALLOON SINUPLASTY    . BREAST LUMPECTOMY Left 2016  . BREAST LUMPECTOMY WITH RADIOACTIVE SEED AND SENTINEL LYMPH NODE BIOPSY Left 12/22/2014   Procedure: BREAST LUMPECTOMY WITH RADIOACTIVE SEED AND SENTINEL LYMPH NODE BIOPSY;  Surgeon: Stark Klein, MD;  Location: [place];  Service: General;  Laterality: Left;  . BUNIONECTOMY Right   . HAMMER TOE SURGERY Bilateral   . RHINOPLASTY    . SEPTOPLASTY      There were no vitals filed for this visit.   Subjective Assessment - 07/27/20 1018    Subjective I've been doing better.  A few bad days.    Pertinent History EMG scheduled for 05/26/20    Diagnostic tests MRI Rt hip 08/19/19: mod degen, spurring, superior labrum degen/torn, MRI lumbar  2018 multi-level disc dessication and lateral stenosis, right foraminal disc extrusion L3/4 with right L3 nerve root impingement    Currently in Pain? No/denies    Pain Score --   pain in Rt leg at the end of the day                            [facility] Adult PT Treatment/Exercise - 07/27/20 0001      High Level Balance   High Level Balance Comments colored discs on floor: low cone on top :forward hip flexion to tap on top of cone and flexion to abduction to step sideways to step on cone 2x10   close supervision and cueing by PT for safety     Knee/Hip Exercises: Standing   Rocker Board 2 minutes    SLS Rt only 15 seconds with moderate UE support    Gait Training walking on level surface: hip flexion and heel strike emphasis on Rt- pt able to do this with 50-75 % accuracy without visual cues and minimal to moderate verbal cueing.    Other Standing Knee Exercises lateral step over pink foam roll and forward step over.  Emphasis on hip flexor to initiate movement and foot clearance.  PT Short Term Goals - 06/28/20 1108      PT SHORT TERM GOAL #1   Title Pt will be ind with initial HEP    Status Achieved      PT SHORT TERM GOAL #2   Title Pt will be able to perform 10 reps of SLR on Rt LE to demo improved Rt hip flexor strength    Status Achieved      PT SHORT TERM GOAL #3   Title Pt will be able to perform step through gait with consistent heel strike on Rt LE x 40 feet    Baseline 30 feet without verbal cueing    Status On-going             PT Long Term Goals - 07/13/20 1100      PT LONG TERM GOAL #1   Title Pt with achieve Rt hip flexor strength of at least 4/5 for improved daily task endurance and ambulation.    Baseline not formally tested- fair quad contraction with SLR and fatigue after 10 reps of SLR on the Rt with min quad lag    Status On-going      PT LONG TERM GOAL #2   Title Pt will perform 5x sit to stand without  compensation of thighs pushing back on table in 12 sec or less to demo reduced fall risk and improved transfers.    Baseline 13 seconds-good symmetry    Status On-going      PT LONG TERM GOAL #3   Title Pt will perform TUG in 12 sec or less with pivot turn without LOB to demo reduced fall risk    Baseline 13 sec, 2-pivot turn, no LOB    Status On-going      PT LONG TERM GOAL #4   Title Pt will be able to demo gait symmetry in a 3 min walk test with min shuffling of Rt LE    Baseline able to do for short distances    Status On-going      PT LONG TERM GOAL #5   Title Pt will be ind with advanced HEP    Status On-going                 Plan - 07/27/20 1035    Clinical Impression Statement Pt will have nerve study test tomorrow.  PT continues to focus on Rt hip flexor activation and foot clearance utilizing multiple props to achieve this.  Pt with improved gait pattern overall with focus for short periods of time.  This pattern does not translate to success with independent walking from week to week.  Pt requires mod to max verbal cueing for alignment and technique with exercise. Pt is making very slow progress due to chronicity of condition.    PT Frequency 1x / week    PT Duration 8 weeks    PT Treatment/Interventions ADLs/Self Care Home Management;Electrical Stimulation;Neuromuscular re-education;Therapeutic exercise;Functional mobility training;Stair training;Gait training;Therapeutic activities;Manual techniques;Patient/family education;Dry needling;Passive range of motion;Joint Manipulations;Spinal Manipulations    PT Next Visit Plan continue gait training for Rt LE hip flexion and heel strike control, see what results of nerve test are    PT Home Exercise Plan Access Code: [email or web address]    Consulted and Agree with Plan of Care Patient           Patient will benefit from skilled therapeutic intervention in order to improve the following deficits and impairments:  Abnormal  gait,Decreased strength,Decreased endurance,Impaired flexibility,Postural dysfunction,Pain,Difficulty walking,Decreased range  of motion  Visit Diagnosis: Muscle weakness (generalized)  Other abnormalities of gait and mobility  Pain in right hip  Pain in right leg  Other muscle spasm     Problem List Patient Active Problem List   Diagnosis Date Noted  . Arthritis of right hip 09/17/2019  . Labral tear of right hip joint 08/06/2019  . Gluteal tendonitis of right buttock 05/22/2019  . Tear of left gluteus medius tendon 01/04/2019  . Arthritis of left hip 03/20/2018  . Greater trochanteric bursitis of right hip 06/13/2017  . 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 Breast cancer of upper-outer quadrant of left female breast ([place]) 12/07/2014  . Acute recurrent sinusitis 01/17/2013    Sigurd Sos, PT 07/27/20 10:48 AM PHYSICAL THERAPY DISCHARGE SUMMARY  Visits from Start of Care: 22  Current functional level related to goals / functional outcomes: See above for current status.  Pt didn't return to PT after last session.    Remaining deficits: See above for most current PT status.     Education / Equipment: HEP, fall prevention Plan: Patient agrees to discharge.  Patient goals were partially met. Patient is being discharged due to not returning since the last visit.  ?????       Sigurd Sos, PT 10/06/20 10:11 AM   [facility] [address] [place], [place], [address] Phone: [phone]   Fax:  [phone]  Name: MELEAH DEMEYER MRN: [id number] Date of Birth: November 17, 1950

## 2020-07-28 ENCOUNTER — Ambulatory Visit: Payer: PPO | Admitting: Neurology

## 2020-07-28 DIAGNOSIS — R202 Paresthesia of skin: Secondary | ICD-10-CM

## 2020-07-28 NOTE — Procedures (Signed)
[facility]  [address]  [place], [place] [address] Tel: [phone] Fax:  [phone] Test Date:  07/28/2020  Patient: Christina [name] DOB: 24-May-1950 Physician: Narda Amber, DO  Sex: Female Height: 5\' 3"  Ref Phys: Hulan Saas, DO  ID#: [id number]   Technician:    Patient Complaints: This is a 70 year old female referred for evaluation of right leg weakness.  NCV & EMG Findings: Electrodiagnostic testing was limited and terminated at patient's request due to pain.  Findings show normal sural and superficial peroneal sensory responses.  Motor nerve conduction study and needle electromyography was not performed.  Impression: This is an incomplete study, as testing was terminated at patient's request due to pain.   ___________________________ Narda Amber, DO    Nerve Conduction Studies Anti Sensory Summary Table   Stim Site NR Peak (ms) Norm Peak (ms) P-T Amp (V) Norm P-T Amp  Right Sup Peroneal Anti Sensory (Ant Lat Mall)  33C  12 cm    2.9 <4.6 6.4 >3  Right Sural Anti Sensory (Lat Mall)  33C  Calf    2.8 <4.6 16.5 >3   Motor Summary Table   Stim Site NR Onset (ms) Norm Onset (ms) O-P Amp (mV) Norm O-P Amp Site1 Site2 Delta-0 (ms) Dist (cm) Vel (m/s) Norm Vel (m/s)  Right Peroneal Motor (Ext Dig Brev)  33C  Ankle    3.0 <6.0 0.8 >2.5            Waveforms:

## 2020-08-12 ENCOUNTER — Encounter (HOSPITAL_COMMUNITY): Payer: Self-pay

## 2020-08-12 ENCOUNTER — Emergency Department (HOSPITAL_COMMUNITY): Payer: PPO

## 2020-08-12 ENCOUNTER — Other Ambulatory Visit: Payer: Self-pay

## 2020-08-12 ENCOUNTER — Emergency Department (HOSPITAL_COMMUNITY)
Admission: EM | Admit: 2020-08-12 | Discharge: 2020-08-12 | Disposition: A | Discharge status: Home / Self Care | Payer: PPO | Attending: Emergency Medicine | Admitting: Emergency Medicine

## 2020-08-12 DIAGNOSIS — Z7984 Long term (current) use of oral hypoglycemic drugs: Secondary | ICD-10-CM | POA: Insufficient documentation

## 2020-08-12 DIAGNOSIS — E039 Hypothyroidism, unspecified: Secondary | ICD-10-CM | POA: Insufficient documentation

## 2020-08-12 DIAGNOSIS — S199XXA Unspecified injury of neck, initial encounter: Secondary | ICD-10-CM | POA: Diagnosis not present

## 2020-08-12 DIAGNOSIS — Z853 Personal history of malignant neoplasm of breast: Secondary | ICD-10-CM | POA: Diagnosis not present

## 2020-08-12 DIAGNOSIS — S0083XA Contusion of other part of head, initial encounter: Secondary | ICD-10-CM | POA: Diagnosis not present

## 2020-08-12 DIAGNOSIS — W01198A Fall on same level from slipping, tripping and stumbling with subsequent striking against other object, initial encounter: Secondary | ICD-10-CM | POA: Diagnosis not present

## 2020-08-12 DIAGNOSIS — S0993XA Unspecified injury of face, initial encounter: Secondary | ICD-10-CM | POA: Diagnosis present

## 2020-08-12 DIAGNOSIS — W19XXXA Unspecified fall, initial encounter: Secondary | ICD-10-CM

## 2020-08-12 DIAGNOSIS — Z79899 Other long term (current) drug therapy: Secondary | ICD-10-CM | POA: Diagnosis not present

## 2020-08-12 DIAGNOSIS — Z923 Personal history of irradiation: Secondary | ICD-10-CM | POA: Diagnosis not present

## 2020-08-12 DIAGNOSIS — Z87891 Personal history of nicotine dependence: Secondary | ICD-10-CM | POA: Diagnosis not present

## 2020-08-12 LAB — CBG MONITORING, ED: Glucose-Capillary: 121 mg/dL — ABNORMAL HIGH (ref 70–99)

## 2020-08-12 MED ORDER — ONDANSETRON 4 MG PO TBDP
4.0000 mg | ORAL_TABLET | Freq: Once | ORAL | Status: AC | PRN
  Administered 2020-08-12: 4 mg via ORAL
  Filled 2020-08-12: qty 1

## 2020-08-12 NOTE — ED Provider Notes (Signed)
[facility] DEPT Provider Note   CSN: [id number] Arrival date & time: 08/12/20  1408     History Chief Complaint  Patient presents with  . Fall    Christina [name] is a 70 y.o. female presents to ER for evaluation of fall that occurred PTA. States she stepped on cat litter that was on the ground and she slipped and fell backwards. She was able to get up on her own. She hit her left cheek. He has associated left cheekbone pain, bruising. No LOC. No OAC. Denies headache, vision changes, nausea, vomiting. No neck pain or any other physical injuries. Reports long history of light headedness, gait issues and frequent falls. She is going to physical therapy. Denies any changes to gait issues and light headedness.   HPI     Past Medical History:  Diagnosis Date  . Abrasion of right leg 12/17/2014  . Adrenal insufficiency ([place])   . Breast cancer of upper-outer quadrant of left female breast ([place]) 12/07/2014  . Cataract, immature    bilateral  . Dental bridge present    upper - x 2  . Dental crowns present   . Hypothyroidism   . Personal history of radiation therapy   . Prediabetes     Patient Active Problem List   Diagnosis Date Noted  . Arthritis of right hip 09/17/2019  . Labral tear of right hip joint 08/06/2019  . Gluteal tendonitis of right buttock 05/22/2019  . Tear of left gluteus medius tendon 01/04/2019  . Arthritis of left hip 03/20/2018  . Greater trochanteric bursitis of right hip 06/13/2017  . 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 Breast cancer of upper-outer quadrant of left female breast ([place]) 12/07/2014  . Acute recurrent sinusitis 01/17/2013    Past Surgical History:  Procedure Laterality Date  . ABDOMINOPLASTY    . BALLOON SINUPLASTY    . BREAST LUMPECTOMY Left 2016  . BREAST LUMPECTOMY WITH RADIOACTIVE SEED AND SENTINEL LYMPH NODE BIOPSY Left 12/22/2014   Procedure: BREAST LUMPECTOMY WITH RADIOACTIVE SEED AND  SENTINEL LYMPH NODE BIOPSY;  Surgeon: Stark Klein, MD;  Location: [facility];  Service: General;  Laterality: Left;  . BUNIONECTOMY Right   . HAMMER TOE SURGERY Bilateral   . RHINOPLASTY    . SEPTOPLASTY       OB History   No obstetric history on file.     Family History  Problem Relation Age of Onset  . Diabetes Mother   . Diabetes Father   . Cancer Maternal Uncle        pancreatic cancer   . Diabetes Paternal Grandmother   . Diabetes Paternal Grandfather   . Breast cancer Sister     Social History   Tobacco Use  . Smoking status: Former Smoker    Packs/day: 0.00    Years: 0.00    Pack years: 0.00    Quit date: 05/01/1980    Years since quitting: 40.3  . Smokeless tobacco: Never Used  Vaping Use  . Vaping Use: Never used  Substance Use Topics  . Alcohol use: Yes    Alcohol/week: 0.0 standard drinks    Comment: occasionally  . Drug use: No    Home Medications Prior to Admission medications   Medication Sig Start Date End Date Taking? Authorizing Provider  albuterol (PROVENTIL HFA;VENTOLIN HFA) 108 (90 BASE) MCG/ACT inhaler Inhale into the lungs every 6 (six) hours as needed for wheezing or shortness of breath.  [provider]  Alpha Lipoic Acid 200 MG CAPS Take 600 mg by mouth 2 (two) times daily.     [provider]  Ascorbic Acid (VITAMIN C) 1000 MG tablet Take 2,000-3,000 mg by mouth 2 (two) times daily. Takes 2000mg  in the morning and 3000mg  at night    [provider]  calcium carbonate (OS-CAL) 600 MG TABS tablet Take 800 mg by mouth 2 (two) times daily with a meal.    [provider]  cetirizine (ZYRTEC) 10 MG tablet Take 10 mg by mouth daily.     [provider]  clonazePAM (KLONOPIN) 1 MG tablet Take 1 tablet (1 mg total) by mouth at bedtime. 03/01/20   Marrian Salvage, FNP  Coenzyme Q10 200 MG capsule Take 200 mg by mouth daily.    [provider]  cyclobenzaprine (FLEXERIL) 10  MG tablet TAKE 1 TABLET BY MOUTH THREE TIMES A DAY AS NEEDED FOR MUSCLE SPASMS *INS MAX 21 DAY SUPPLY* 06/25/20   Marrian Salvage, FNP  cycloSPORINE (RESTASIS) 0.05 % ophthalmic emulsion Place 1 drop into both eyes 2 (two) times daily. 05/22/18   [provider]  Flaxseed, Linseed, (FLAXSEED OIL) 1000 MG CAPS Take 1,000 mg by mouth daily.     [provider]  fluticasone (FLONASE) 50 MCG/ACT nasal spray Place 1 spray into both nostrils daily. 02/23/15   [provider]  guaiFENesin (MUCINEX) 600 MG 12 hr tablet Take 600 mg by mouth daily.     [provider]  ibuprofen (ADVIL,MOTRIN) 200 MG tablet Take 600 mg by mouth every 4 (four) hours as needed for fever, headache, mild pain, moderate pain or cramping.    [provider]  liothyronine (CYTOMEL) 25 MCG tablet Take 1 tablet (25 mcg total) by mouth daily. 2 in the am; 1 at noon 05/19/20   Shamleffer, Melanie Crazier, MD  MAGNESIUM LACTATE PO Take 2 tablets by mouth at bedtime.    [provider]  metFORMIN (GLUCOPHAGE-XR) 500 MG 24 hr tablet Take 1,000 mg by mouth 2 (two) times daily.  02/08/15   [provider]  montelukast (SINGULAIR) 10 MG tablet Take 1 tablet (10 mg total) by mouth at bedtime. 03/01/20   Marrian Salvage, FNP  niacin 500 MG tablet Take 500 mg by mouth daily.     [provider]  ondansetron (ZOFRAN ODT) 4 MG disintegrating tablet Take 1 tablet (4 mg total) by mouth every 8 (eight) hours as needed for nausea or vomiting. 03/22/18   Nuala Alpha A, PA-C  OVER THE COUNTER MEDICATION Take 1-2 tablets by mouth 2 (two) times daily. *Nutrient 950 with NAC2* takes 2 tablets in the morning and 1 tablet at lunchtime    [provider]  OVER THE COUNTER MEDICATION Take 2 tablets by mouth daily with breakfast. *LV GB Complex*    [provider]  OVER THE COUNTER MEDICATION Take 1 tablet by mouth 2 (two) times daily. *Regenemax or Biosil*     [provider]  OVER THE COUNTER MEDICATION Take 30 mg by mouth daily. *Borotab*    [provider]  STRONTIUM GLUCONATE-B6-B12-FA PO Take 2 tablets by mouth daily.     [provider]  triamcinolone cream (KENALOG) 0.1 % Apply 1 application topically 2 (two) times daily. 11/18/19   Gregor Hams, MD  valACYclovir (VALTREX) 1000 MG tablet TAKE 2 TABLETS EVERY MORNING AND TAKE 2 TABLETS EVERY EVENING FOR 1 DAY AS NEEDED FOR FLARE 03/12/19  [provider]  Vitamin D, Ergocalciferol, (DRISDOL) 50000 units CAPS capsule Take 50,000 Units by mouth every Sunday.    [provider]  VITAMIN K, PHYTONADIONE, PO Take 2 tablets by mouth daily.     [provider]  zinc gluconate 50 MG tablet Take 50-100 mg by mouth 2 (two) times daily. Takes 100mg  in the morning and 50mg  later in the day    [provider]    Allergies    Celery oil, Doxycycline, Rye grass flower pollen extract [gramineae pollens], Sulfa antibiotics, Tea, and Adhesive [tape]  Review of Systems   Review of Systems  Skin: Positive for color change.  All other systems reviewed and are negative.   Physical Exam Updated Vital Signs BP (!) 177/96   Pulse 91   Temp 98.2 F (36.8 C) (Oral)   Resp 18   SpO2 97%   Physical Exam Constitutional:      General: She is not in acute distress.    Appearance: She is well-developed.  HENT:     Head:     Comments: Left maxillary and zygomatic bone tenderness, ecchymosis.  Not involving eyelids.      Ears:     Comments: No hemotympanum. No Battle's sign.    Nose:     Comments: No intranasal bleeding or rhinorrhea. Septum midline    Mouth/Throat:     Comments: No intraoral bleeding or injury. No malocclusion. MMM. Dentition appears stable.  Eyes:     Conjunctiva/sclera: Conjunctivae normal.     Comments: Lids normal. EOMs and PERRL intact.   Neck:     Comments: C-spine: no midline or paraspinal muscular tenderness. Full  active ROM of cervical spine w/o pain. Trachea midline Cardiovascular:     Rate and Rhythm: Normal rate and regular rhythm.     Pulses:          Radial pulses are 1+ on the right side and 1+ on the left side.       Dorsalis pedis pulses are 1+ on the right side and 1+ on the left side.     Heart sounds: Normal heart sounds, S1 normal and S2 normal.  Pulmonary:     Effort: Pulmonary effort is normal.     Breath sounds: Normal breath sounds. No decreased breath sounds.  Abdominal:     Palpations: Abdomen is soft.     Tenderness: There is no abdominal tenderness.  Musculoskeletal:        General: No deformity. Normal range of motion.     Comments:  T-spine: no paraspinal muscular tenderness or midline tenderness.   L-spine: no paraspinal muscular or midline tenderness.   Skin:    General: Skin is warm and dry.     Capillary Refill: Capillary refill takes less than 2 seconds.  Neurological:     Mental Status: She is alert, oriented to person, place, and time and easily aroused.     Comments: Speech is fluent without obvious dysarthria or dysphasia. Strength 5/5 with hand grip and ankle F/E.   Sensation to light touch intact in hands and feet.  CN II-XII grossly intact bilaterally.   Psychiatric:        Behavior: Behavior normal. Behavior is cooperative.        Thought Content: Thought content normal.     ED Results / Procedures / Treatments   Labs (all labs ordered are listed, but only abnormal results are displayed) Labs Reviewed  CBG MONITORING, ED - Abnormal; Notable for  the following components:      Result Value   Glucose-Capillary 121 (*)    All other components within normal limits    EKG None  Radiology No results found.  Procedures Procedures   Medications Ordered in ED Medications - No data to display  ED Course  I have reviewed the triage vital signs and the nursing notes.  Pertinent labs & imaging results that were available during my care of the  patient were reviewed by me and considered in my medical decision making (see chart for details).    MDM Rules/Calculators/A&P                          70 yo F here after mechanical fall with left maxillary/zygomatic contusion. CT negative for acute findings.  Will discharge with symptomatic management. She denies red flags like prodromal symptoms, palpitations, syncope or any other physical injuries today. Reasonable to defer further emergent lab work and imaging.  Return precautions given.   Final Clinical Impression(s) / ED Diagnoses Final diagnoses:  Fall, initial encounter  Contusion of face, initial encounter    Rx / DC Orders ED Discharge Orders    None       Kinnie Feil, PA-C 08/12/20 1757    Drenda Freeze, MD 08/17/20 [phone]

## 2020-08-12 NOTE — ED Triage Notes (Signed)
Pt arrived via POV, s/p mechanical fall this morning. Hit head on ground. Denies any LOC, headache or blurred vision, no blood thinners.

## 2020-08-12 NOTE — Discharge Instructions (Addendum)
You were seen in the ER for fall  Your scans today did not show any acute or new fractures or injuries  Take acetaminophen or ibuprofen as needed for pain. Ice. Sleep with head elevated.   Return for severe headache, visual changes, new or worsening symptoms

## 2020-08-13 ENCOUNTER — Other Ambulatory Visit: Payer: Self-pay

## 2020-08-13 ENCOUNTER — Emergency Department (HOSPITAL_COMMUNITY): Payer: PPO

## 2020-08-13 ENCOUNTER — Emergency Department (HOSPITAL_COMMUNITY)
Admission: EM | Admit: 2020-08-13 | Discharge: 2020-08-14 | Disposition: A | Discharge status: Home / Self Care | Payer: PPO | Attending: Emergency Medicine | Admitting: Emergency Medicine

## 2020-08-13 ENCOUNTER — Encounter (HOSPITAL_COMMUNITY): Payer: Self-pay

## 2020-08-13 DIAGNOSIS — R410 Disorientation, unspecified: Secondary | ICD-10-CM | POA: Diagnosis not present

## 2020-08-13 DIAGNOSIS — M25551 Pain in right hip: Secondary | ICD-10-CM | POA: Insufficient documentation

## 2020-08-13 DIAGNOSIS — S060X0D Concussion without loss of consciousness, subsequent encounter: Secondary | ICD-10-CM | POA: Insufficient documentation

## 2020-08-13 DIAGNOSIS — S0083XD Contusion of other part of head, subsequent encounter: Secondary | ICD-10-CM | POA: Diagnosis not present

## 2020-08-13 DIAGNOSIS — G8929 Other chronic pain: Secondary | ICD-10-CM | POA: Diagnosis not present

## 2020-08-13 DIAGNOSIS — Z87891 Personal history of nicotine dependence: Secondary | ICD-10-CM | POA: Diagnosis not present

## 2020-08-13 DIAGNOSIS — R4182 Altered mental status, unspecified: Secondary | ICD-10-CM | POA: Diagnosis not present

## 2020-08-13 DIAGNOSIS — Z853 Personal history of malignant neoplasm of breast: Secondary | ICD-10-CM | POA: Insufficient documentation

## 2020-08-13 DIAGNOSIS — W19XXXD Unspecified fall, subsequent encounter: Secondary | ICD-10-CM | POA: Insufficient documentation

## 2020-08-13 DIAGNOSIS — Z79899 Other long term (current) drug therapy: Secondary | ICD-10-CM | POA: Insufficient documentation

## 2020-08-13 DIAGNOSIS — R7303 Prediabetes: Secondary | ICD-10-CM | POA: Diagnosis not present

## 2020-08-13 DIAGNOSIS — Z7984 Long term (current) use of oral hypoglycemic drugs: Secondary | ICD-10-CM | POA: Insufficient documentation

## 2020-08-13 DIAGNOSIS — E039 Hypothyroidism, unspecified: Secondary | ICD-10-CM | POA: Insufficient documentation

## 2020-08-13 DIAGNOSIS — R531 Weakness: Secondary | ICD-10-CM | POA: Diagnosis not present

## 2020-08-13 DIAGNOSIS — R000 Tachycardia, unspecified: Secondary | ICD-10-CM | POA: Diagnosis not present

## 2020-08-13 DIAGNOSIS — S0990XD Unspecified injury of head, subsequent encounter: Secondary | ICD-10-CM | POA: Diagnosis present

## 2020-08-13 DIAGNOSIS — W19XXXA Unspecified fall, initial encounter: Secondary | ICD-10-CM | POA: Diagnosis not present

## 2020-08-13 DIAGNOSIS — S060X0A Concussion without loss of consciousness, initial encounter: Secondary | ICD-10-CM | POA: Diagnosis not present

## 2020-08-13 LAB — CBC WITH DIFFERENTIAL/PLATELET
Abs Immature Granulocytes: 0.12 10*3/uL — ABNORMAL HIGH (ref 0.00–0.07)
Basophils Absolute: 0 10*3/uL (ref 0.0–0.1)
Basophils Relative: 0 %
Eosinophils Absolute: 0 10*3/uL (ref 0.0–0.5)
Eosinophils Relative: 0 %
HCT: 36.7 % (ref 36.0–46.0)
Hemoglobin: 12.4 g/dL (ref 12.0–15.0)
Immature Granulocytes: 1 %
Lymphocytes Relative: 11 %
Lymphs Abs: 1.9 10*3/uL (ref 0.7–4.0)
MCH: 30.8 pg (ref 26.0–34.0)
MCHC: 33.8 g/dL (ref 30.0–36.0)
MCV: 91.3 fL (ref 80.0–100.0)
Monocytes Absolute: 1.1 10*3/uL — ABNORMAL HIGH (ref 0.1–1.0)
Monocytes Relative: 6 %
Neutro Abs: 13.7 10*3/uL — ABNORMAL HIGH (ref 1.7–7.7)
Neutrophils Relative %: 82 %
Platelets: 292 10*3/uL (ref 150–400)
RBC: 4.02 MIL/uL (ref 3.87–5.11)
RDW: 12.8 % (ref 11.5–15.5)
WBC: 16.9 10*3/uL — ABNORMAL HIGH (ref 4.0–10.5)
nRBC: 0 % (ref 0.0–0.2)

## 2020-08-13 LAB — BASIC METABOLIC PANEL
Anion gap: 13 (ref 5–15)
BUN: 20 mg/dL (ref 8–23)
CO2: 21 mmol/L — ABNORMAL LOW (ref 22–32)
Calcium: 8.6 mg/dL — ABNORMAL LOW (ref 8.9–10.3)
Chloride: 97 mmol/L — ABNORMAL LOW (ref 98–111)
Creatinine, Ser: 1.59 mg/dL — ABNORMAL HIGH (ref 0.44–1.00)
GFR, Estimated: 35 mL/min — ABNORMAL LOW (ref >60)
Glucose, Bld: 115 mg/dL — ABNORMAL HIGH (ref 70–99)
Potassium: 3.9 mmol/L (ref 3.5–5.1)
Sodium: 131 mmol/L — ABNORMAL LOW (ref 135–145)

## 2020-08-13 LAB — URINALYSIS, ROUTINE W REFLEX MICROSCOPIC
Bacteria, UA: NONE SEEN
Bilirubin Urine: NEGATIVE
Glucose, UA: NEGATIVE mg/dL
Hgb urine dipstick: NEGATIVE
Ketones, ur: 5 mg/dL — AB
Leukocytes,Ua: NEGATIVE
Nitrite: NEGATIVE
Protein, ur: 100 mg/dL — AB
Specific Gravity, Urine: 1.012 (ref 1.005–1.030)
pH: 5 (ref 5.0–8.0)

## 2020-08-13 LAB — MAGNESIUM: Magnesium: 1.7 mg/dL (ref 1.7–2.4)

## 2020-08-13 MED ORDER — SODIUM CHLORIDE 0.9 % IV BOLUS
1000.0000 mL | Freq: Once | INTRAVENOUS | Status: AC
  Administered 2020-08-13: 1000 mL via INTRAVENOUS

## 2020-08-13 NOTE — ED Provider Notes (Signed)
[facility] EMERGENCY DEPARTMENT Provider Note   CSN: [id number] Arrival date & time: 08/13/20  1832     History Chief Complaint  Patient presents with  . Fall  . Weakness    Christina [name] is a 70 y.o. female past medical history of right labral tear, presenting for reevaluation after ED visit yesterday.  Patient was seen yesterday after mechanical fall.  CT imaging of her head, max face and C-spine were negative.  She does have contusion to the left face.  She presents today for complaint of feeling "weird."  She is unable describe this sensation though states it resolved soon after arrival to the ED.  She denies any palpitations or chest pain.  Contrary to triage note, she denies any headache or weakness.  Denies fever, cough, abd pain, CP, SOB, urinary sx. endorses chronic right hip pain, more painful since the fall yesterday. She is alert and oriented.    The history is provided by the patient and medical records.       Past Medical History:  Diagnosis Date  . Abrasion of right leg 12/17/2014  . Adrenal insufficiency ([place])   . Breast cancer of upper-outer quadrant of left female breast ([place]) 12/07/2014  . Cataract, immature    bilateral  . Dental bridge present    upper - x 2  . Dental crowns present   . Hypothyroidism   . Personal history of radiation therapy   . Prediabetes     Patient Active Problem List   Diagnosis Date Noted  . Arthritis of right hip 09/17/2019  . Labral tear of right hip joint 08/06/2019  . Gluteal tendonitis of right buttock 05/22/2019  . Tear of left gluteus medius tendon 01/04/2019  . Arthritis of left hip 03/20/2018  . Greater trochanteric bursitis of right hip 06/13/2017  . 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 Breast cancer of upper-outer quadrant of left female breast ([place]) 12/07/2014  . Acute recurrent sinusitis 01/17/2013    Past Surgical History:  Procedure Laterality Date  . ABDOMINOPLASTY     . BALLOON SINUPLASTY    . BREAST LUMPECTOMY Left 2016  . BREAST LUMPECTOMY WITH RADIOACTIVE SEED AND SENTINEL LYMPH NODE BIOPSY Left 12/22/2014   Procedure: BREAST LUMPECTOMY WITH RADIOACTIVE SEED AND SENTINEL LYMPH NODE BIOPSY;  Surgeon: Stark Klein, MD;  Location: [facility];  Service: General;  Laterality: Left;  . BUNIONECTOMY Right   . HAMMER TOE SURGERY Bilateral   . RHINOPLASTY    . SEPTOPLASTY       OB History   No obstetric history on file.     Family History  Problem Relation Age of Onset  . Diabetes Mother   . Diabetes Father   . Cancer Maternal Uncle        pancreatic cancer   . Diabetes Paternal Grandmother   . Diabetes Paternal Grandfather   . Breast cancer Sister     Social History   Tobacco Use  . Smoking status: Former Smoker    Packs/day: 0.00    Years: 0.00    Pack years: 0.00    Quit date: 05/01/1980    Years since quitting: 40.3  . Smokeless tobacco: Never Used  Vaping Use  . Vaping Use: Never used  Substance Use Topics  . Alcohol use: Yes    Alcohol/week: 0.0 standard drinks    Comment: occasionally  . Drug use: No    Home Medications Prior to Admission  medications   Medication Sig Start Date End Date Taking? Authorizing Provider  albuterol (PROVENTIL HFA;VENTOLIN HFA) 108 (90 BASE) MCG/ACT inhaler Inhale into the lungs every 6 (six) hours as needed for wheezing or shortness of breath.   Yes [provider]  Alpha Lipoic Acid 200 MG CAPS Take 200 mg by mouth daily.   Yes [provider]  Ascorbic Acid (VITAMIN C) 1000 MG tablet Take 2,000 mg by mouth daily.   Yes [provider]  calcium carbonate (OS-CAL) 600 MG TABS tablet Take 600 mg by mouth 2 (two) times daily with a meal.   Yes [provider]  cetirizine (ZYRTEC) 10 MG tablet Take 10 mg by mouth at bedtime.   Yes [provider]  clonazePAM (KLONOPIN) 1 MG tablet Take 1 tablet (1 mg total) by mouth at bedtime. 03/01/20  Yes  Marrian Salvage, FNP  Coenzyme Q10 200 MG capsule Take 200 mg by mouth daily.   Yes [provider]  cyclobenzaprine (FLEXERIL) 10 MG tablet TAKE 1 TABLET BY MOUTH THREE TIMES A DAY AS NEEDED FOR MUSCLE SPASMS *INS MAX 21 DAY SUPPLY* Patient taking differently: Take 10 mg by mouth 3 (three) times daily as needed for muscle spasms. 06/25/20  Yes Marrian Salvage, FNP  cycloSPORINE (RESTASIS) 0.05 % ophthalmic emulsion Place 1 drop into both eyes 2 (two) times daily. 05/22/18  Yes [provider]  Flaxseed, Linseed, (FLAXSEED OIL) 1000 MG CAPS Take 1,000 mg by mouth daily.    Yes [provider]  fluticasone (FLONASE) 50 MCG/ACT nasal spray Place 1 spray into both nostrils at bedtime. 02/23/15  Yes [provider]  guaiFENesin (MUCINEX) 600 MG 12 hr tablet Take 600 mg by mouth daily.    Yes [provider]  ibuprofen (ADVIL,MOTRIN) 200 MG tablet Take 600 mg by mouth every 4 (four) hours as needed for fever, headache, mild pain, moderate pain or cramping.   Yes [provider]  liothyronine (CYTOMEL) 25 MCG tablet Take 1 tablet (25 mcg total) by mouth daily. 2 in the am; 1 at noon Patient taking differently: Take 50 mcg by mouth daily. 05/19/20  Yes Shamleffer, Melanie Crazier, MD  MAGNESIUM LACTATE PO Take 2 tablets by mouth at bedtime.   Yes [provider]  metFORMIN (GLUCOPHAGE-XR) 500 MG 24 hr tablet Take 1,000 mg by mouth 2 (two) times daily.  02/08/15  Yes [provider]  montelukast (SINGULAIR) 10 MG tablet Take 1 tablet (10 mg total) by mouth at bedtime. 03/01/20  Yes Marrian Salvage, FNP  niacin 500 MG tablet Take 500 mg by mouth daily.    Yes [provider]  ondansetron (ZOFRAN ODT) 4 MG disintegrating tablet Take 1 tablet (4 mg total) by mouth every 8 (eight) hours as needed for nausea or vomiting. 03/22/18  Yes Morelli, Brandon A, PA-C  OVER THE COUNTER MEDICATION Take 1-2 tablets by mouth 2  (two) times daily. *Nutrient 950 with NAC2* takes 2 tablets in the morning and 1 tablet at lunchtime   Yes [provider]  OVER THE COUNTER MEDICATION Take 2 tablets by mouth daily with breakfast. *LV GB Complex*   Yes [provider]  OVER THE COUNTER MEDICATION Take 1 tablet by mouth 2 (two) times daily. *Regenemax or Biosil*   Yes [provider]  OVER THE COUNTER MEDICATION Take 30 mg by mouth daily. *Borotab*   Yes [provider]  STRONTIUM GLUCONATE-B6-B12-FA PO Take 2 tablets by mouth daily.  Yes [provider]  triamcinolone cream (KENALOG) 0.1 % Apply 1 application topically 2 (two) times daily. Patient taking differently: Apply 1 application topically 2 (two) times daily as needed (itching). 11/18/19  Yes Gregor Hams, MD  valACYclovir (VALTREX) 1000 MG tablet TAKE 2 TABLETS EVERY MORNING AND TAKE 2 TABLETS EVERY EVENING FOR 1 DAY AS NEEDED FOR FLARE 03/12/19  Yes [provider]  Vitamin D, Ergocalciferol, (DRISDOL) 50000 units CAPS capsule Take 50,000 Units by mouth every Sunday.   Yes [provider]  VITAMIN K, PHYTONADIONE, PO Take 2 tablets by mouth daily.    Yes [provider]  zinc gluconate 50 MG tablet Take 100 mg by mouth daily.   Yes [provider]    Allergies    Other, Celery oil, Doxycycline, [place] grass flower pollen extract [gramineae pollens], Sulfa antibiotics, Tea, and Adhesive [tape]  Review of Systems   Review of Systems  All other systems reviewed and are negative.   Physical Exam Updated Vital Signs BP (!) 155/90   Pulse 93   Temp 99 F (37.2 C) (Oral)   Resp 15   Ht 5\' 3"  (1.6 m)   SpO2 98%   BMI 21.43 kg/m   Physical Exam Vitals and nursing note reviewed.  Constitutional:      General: She is not in acute distress.    Appearance: She is well-developed.  HENT:     Head: Normocephalic.     Comments: Contusion to the left face Eyes:     Extraocular  Movements: Extraocular movements intact.     Conjunctiva/sclera: Conjunctivae normal.     Pupils: Pupils are equal, round, and reactive to light.  Cardiovascular:     Rate and Rhythm: Normal rate and regular rhythm.  Pulmonary:     Effort: Pulmonary effort is normal. No respiratory distress.     Breath sounds: Normal breath sounds.  Abdominal:     General: Bowel sounds are normal.     Palpations: Abdomen is soft.     Tenderness: There is no abdominal tenderness.  Skin:    General: Skin is warm.  Neurological:     Mental Status: She is alert.     Comments: Alert and oriented to person, place, year, month, situation.  Speech is fluent without aphasia.  Following simple commands without difficulty.  Cranial nerves are grossly intact.  EOM normal, PERRL.  Strength and sensation strong and equal to bilateral upper and lower extremities.  Normal tone and coordination  Psychiatric:        Behavior: Behavior normal.     ED Results / Procedures / Treatments   Labs (all labs ordered are listed, but only abnormal results are displayed) Labs Reviewed  BASIC METABOLIC PANEL - Abnormal; Notable for the following components:      Result Value   Sodium 131 (*)    Chloride 97 (*)    CO2 21 (*)    Glucose, Bld 115 (*)    Creatinine, Ser 1.59 (*)    Calcium 8.6 (*)    GFR, Estimated 35 (*)    All other components within normal limits  CBC WITH DIFFERENTIAL/PLATELET - Abnormal; Notable for the following components:   WBC 16.9 (*)    Neutro Abs 13.7 (*)    Monocytes Absolute 1.1 (*)    Abs Immature Granulocytes 0.12 (*)    All other components within normal limits  URINALYSIS, ROUTINE W REFLEX MICROSCOPIC - Abnormal; Notable for the following components:  Ketones, ur 5 (*)    Protein, ur 100 (*)    All other components within normal limits  MAGNESIUM    EKG EKG Interpretation  Date/Time:  Friday August 13 2020 19:03:48 EDT Ventricular Rate:  107 PR Interval:  149 QRS  Duration: 76 QT Interval:  350 QTC Calculation: 467 R Axis:   10 Text Interpretation: Sinus tachycardia Confirmed by Lavenia Atlas [phone]) on 08/13/2020 7:12:21 PM   Radiology CT Head Wo Contrast  Result Date: 08/13/2020 CLINICAL DATA:  Recent trauma, worsening confusion EXAM: CT HEAD WITHOUT CONTRAST TECHNIQUE: Contiguous axial images were obtained from the base of the skull through the vertex without intravenous contrast. COMPARISON:  08/12/2020 FINDINGS: Brain: Stable hypodensities throughout the periventricular white matter consistent with chronic small vessel ischemic changes. No signs of acute infarct or hemorrhage. Stable diffuse cerebral atrophy with ex vacuo dilatation of the lateral ventricles. The lateral ventricles and midline structures are otherwise unremarkable. No acute extra-axial fluid collections. No mass effect. Vascular: No hyperdense vessel or unexpected calcification. Skull: Normal. Negative for fracture or focal lesion. Sinuses/Orbits: No acute finding. Other: None. IMPRESSION: 1. Stable head CT, no acute process. Electronically Signed   By: Randa Ngo M.D.   On: 08/13/2020 21:08   CT Head Wo Contrast  Result Date: 08/12/2020 CLINICAL DATA:  70 year old female with history of trauma from a fall with contusion to the left maxillary region. EXAM: CT HEAD WITHOUT CONTRAST CT MAXILLOFACIAL WITHOUT CONTRAST CT CERVICAL SPINE WITHOUT CONTRAST TECHNIQUE: Multidetector CT imaging of the head, cervical spine, and maxillofacial structures were performed using the standard protocol without intravenous contrast. Multiplanar CT image reconstructions of the cervical spine and maxillofacial structures were also generated. COMPARISON:  No priors. FINDINGS: CT HEAD FINDINGS Brain: Mild-to-moderate cerebral and mild cerebellar atrophy with ex vacuo dilatation of the ventricular system. Patchy and confluent areas of decreased attenuation are noted throughout the deep and periventricular white  matter of the cerebral hemispheres bilaterally, compatible with chronic microvascular ischemic disease. No evidence of acute infarction, hemorrhage, hydrocephalus, extra-axial collection or mass lesion/mass effect. Vascular: No hyperdense vessel or unexpected calcification. Skull: Normal. Negative for fracture or focal lesion. Other: None. CT MAXILLOFACIAL FINDINGS Osseous: No fracture or mandibular dislocation. No destructive process. Orbits: Negative. No traumatic or inflammatory finding. Sinuses: Clear. Soft tissues: Some soft tissue calcifications are noted in the right pre maxillary region, likely sequela of remote trauma. CT CERVICAL SPINE FINDINGS Alignment: Normal. Skull base and vertebrae: No acute fracture. No primary bone lesion or focal pathologic process. Soft tissues and spinal canal: No prevertebral fluid or swelling. No visible canal hematoma. Disc levels: Mild degenerative disc disease with a Schmorl's node in the superior endplate of the T1 vertebral body. Upper chest: Unremarkable. Other: None. IMPRESSION: 1. No evidence of significant acute traumatic injury to the skull, brain, facial bones or cervical spine. 2. Mild to moderate cerebral and mild cerebellar atrophy with ex vacuo dilatation of the ventricular system. 3. Extensive chronic microvascular ischemic changes in the cerebral white matter, as above. 4. Additional incidental findings, as above. Electronically Signed   By: Vinnie Langton M.D.   On: 08/12/2020 17:22   CT Cervical Spine Wo Contrast  Result Date: 08/12/2020 CLINICAL DATA:  70 year old female with history of trauma from a fall with contusion to the left maxillary region. EXAM: CT HEAD WITHOUT CONTRAST CT MAXILLOFACIAL WITHOUT CONTRAST CT CERVICAL SPINE WITHOUT CONTRAST TECHNIQUE: Multidetector CT imaging of the head, cervical spine, and maxillofacial structures were performed using the standard  protocol without intravenous contrast. Multiplanar CT image reconstructions of  the cervical spine and maxillofacial structures were also generated. COMPARISON:  No priors. FINDINGS: CT HEAD FINDINGS Brain: Mild-to-moderate cerebral and mild cerebellar atrophy with ex vacuo dilatation of the ventricular system. Patchy and confluent areas of decreased attenuation are noted throughout the deep and periventricular white matter of the cerebral hemispheres bilaterally, compatible with chronic microvascular ischemic disease. No evidence of acute infarction, hemorrhage, hydrocephalus, extra-axial collection or mass lesion/mass effect. Vascular: No hyperdense vessel or unexpected calcification. Skull: Normal. Negative for fracture or focal lesion. Other: None. CT MAXILLOFACIAL FINDINGS Osseous: No fracture or mandibular dislocation. No destructive process. Orbits: Negative. No traumatic or inflammatory finding. Sinuses: Clear. Soft tissues: Some soft tissue calcifications are noted in the right pre maxillary region, likely sequela of remote trauma. CT CERVICAL SPINE FINDINGS Alignment: Normal. Skull base and vertebrae: No acute fracture. No primary bone lesion or focal pathologic process. Soft tissues and spinal canal: No prevertebral fluid or swelling. No visible canal hematoma. Disc levels: Mild degenerative disc disease with a Schmorl's node in the superior endplate of the T1 vertebral body. Upper chest: Unremarkable. Other: None. IMPRESSION: 1. No evidence of significant acute traumatic injury to the skull, brain, facial bones or cervical spine. 2. Mild to moderate cerebral and mild cerebellar atrophy with ex vacuo dilatation of the ventricular system. 3. Extensive chronic microvascular ischemic changes in the cerebral white matter, as above. 4. Additional incidental findings, as above. Electronically Signed   By: Vinnie Langton M.D.   On: 08/12/2020 17:22   DG Chest Port 1 View  Result Date: 08/13/2020 CLINICAL DATA:  Altered mental status EXAM: PORTABLE CHEST 1 VIEW COMPARISON:  None.  FINDINGS: The heart size and mediastinal contours are within normal limits. Both lungs are clear. The visualized skeletal structures are unremarkable. Postsurgical changes in the left breast and axilla are noted. IMPRESSION: No active disease. Electronically Signed   By: Inez Catalina M.D.   On: 08/13/2020 22:15   CT Maxillofacial Wo Contrast  Result Date: 08/12/2020 CLINICAL DATA:  70 year old female with history of trauma from a fall with contusion to the left maxillary region. EXAM: CT HEAD WITHOUT CONTRAST CT MAXILLOFACIAL WITHOUT CONTRAST CT CERVICAL SPINE WITHOUT CONTRAST TECHNIQUE: Multidetector CT imaging of the head, cervical spine, and maxillofacial structures were performed using the standard protocol without intravenous contrast. Multiplanar CT image reconstructions of the cervical spine and maxillofacial structures were also generated. COMPARISON:  No priors. FINDINGS: CT HEAD FINDINGS Brain: Mild-to-moderate cerebral and mild cerebellar atrophy with ex vacuo dilatation of the ventricular system. Patchy and confluent areas of decreased attenuation are noted throughout the deep and periventricular white matter of the cerebral hemispheres bilaterally, compatible with chronic microvascular ischemic disease. No evidence of acute infarction, hemorrhage, hydrocephalus, extra-axial collection or mass lesion/mass effect. Vascular: No hyperdense vessel or unexpected calcification. Skull: Normal. Negative for fracture or focal lesion. Other: None. CT MAXILLOFACIAL FINDINGS Osseous: No fracture or mandibular dislocation. No destructive process. Orbits: Negative. No traumatic or inflammatory finding. Sinuses: Clear. Soft tissues: Some soft tissue calcifications are noted in the right pre maxillary region, likely sequela of remote trauma. CT CERVICAL SPINE FINDINGS Alignment: Normal. Skull base and vertebrae: No acute fracture. No primary bone lesion or focal pathologic process. Soft tissues and spinal canal: No  prevertebral fluid or swelling. No visible canal hematoma. Disc levels: Mild degenerative disc disease with a Schmorl's node in the superior endplate of the T1 vertebral body. Upper chest: Unremarkable. Other: None. IMPRESSION:  1. No evidence of significant acute traumatic injury to the skull, brain, facial bones or cervical spine. 2. Mild to moderate cerebral and mild cerebellar atrophy with ex vacuo dilatation of the ventricular system. 3. Extensive chronic microvascular ischemic changes in the cerebral white matter, as above. 4. Additional incidental findings, as above. Electronically Signed   By: Vinnie Langton M.D.   On: 08/12/2020 17:22    Procedures Procedures   Medications Ordered in ED Medications  sodium chloride 0.9 % bolus 1,000 mL (1,000 mLs Intravenous New Bag/Given 08/13/20 2331)    ED Course  I have reviewed the triage vital signs and the nursing notes.  Pertinent labs & imaging results that were available during my care of the patient were reviewed by me and considered in my medical decision making (see chart for details).    MDM Rules/Calculators/A&P                          Patient presenting today for reevaluation after mechanical fall yesterday.  Patient states the reason she came today was because she had a weird sensation which she was unable to describe though it resolved soon after arrival.  On arrival she was noted to be quite tachycardic to 150 that appears to be sinus rhythm.  That resolved will consider possible SVT.  She has been on cardiac monitor without recurrence and ED stay.  She does admit to maybe not drinking and eating as much as usual, she has mild AKI and sodium of 131.  She is given IV fluids.  Does note to have leukocytosis though UA is negative, chest x-ray is clear.  Repeat head CT remains stable and negative.  She remains alert and oriented and in no distress.  Recommend maintain concussion precautions, close outpatient follow-up, good oral  hydration.  Plan to discharge upon IV fluids completion to home.  She states she is a friend that can stay with her.  Strict return precautions discussed.  Patient work-up and care plan discussed with attending physician Dr. Raliegh Ip. Horton who is in agreement. Final Clinical Impression(s) / ED Diagnoses Final diagnoses:  Concussion without loss of consciousness, subsequent encounter    Rx / DC Orders ED Discharge Orders    None       Navi Erber, [place] N, PA-C 08/14/20 0041    Lorelle Gibbs, DO 08/16/20 2039

## 2020-08-13 NOTE — ED Provider Notes (Incomplete)
[facility] EMERGENCY DEPARTMENT Provider Note   CSN: [id number] Arrival date & time: 08/13/20  1832     History Chief Complaint  Patient presents with  . Fall  . Weakness    Christina [name] is a 70 y.o. female past medical history of right labral tear, presenting for reevaluation after ED visit yesterday.  Patient was seen yesterday after mechanical fall.  CT imaging of her head, max face and C-spine were negative.  She does have contusion to the left face.  She presents today for complaint of feeling "weird."  She is unable describe this sensation though states it resolved soon after arrival to the ED.  She denies any palpitations or chest pain.  Contrary to triage note, she denies any headache or weakness.  Denies fever, cough, abd pain, CP, SOB, urinary sx. endorses chronic right hip pain, more painful since the fall yesterday. She is alert and oriented.    The history is provided by the patient and medical records.       Past Medical History:  Diagnosis Date  . Abrasion of right leg 12/17/2014  . Adrenal insufficiency ([place])   . Breast cancer of upper-outer quadrant of left female breast ([place]) 12/07/2014  . Cataract, immature    bilateral  . Dental bridge present    upper - x 2  . Dental crowns present   . Hypothyroidism   . Personal history of radiation therapy   . Prediabetes     Patient Active Problem List   Diagnosis Date Noted  . Arthritis of right hip 09/17/2019  . Labral tear of right hip joint 08/06/2019  . Gluteal tendonitis of right buttock 05/22/2019  . Tear of left gluteus medius tendon 01/04/2019  . Arthritis of left hip 03/20/2018  . Greater trochanteric bursitis of right hip 06/13/2017  . 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 Breast cancer of upper-outer quadrant of left female breast ([place]) 12/07/2014  . Acute recurrent sinusitis 01/17/2013    Past Surgical History:  Procedure Laterality Date  . ABDOMINOPLASTY     . BALLOON SINUPLASTY    . BREAST LUMPECTOMY Left 2016  . BREAST LUMPECTOMY WITH RADIOACTIVE SEED AND SENTINEL LYMPH NODE BIOPSY Left 12/22/2014   Procedure: BREAST LUMPECTOMY WITH RADIOACTIVE SEED AND SENTINEL LYMPH NODE BIOPSY;  Surgeon: Stark Klein, MD;  Location: [facility];  Service: General;  Laterality: Left;  . BUNIONECTOMY Right   . HAMMER TOE SURGERY Bilateral   . RHINOPLASTY    . SEPTOPLASTY       OB History   No obstetric history on file.     Family History  Problem Relation Age of Onset  . Diabetes Mother   . Diabetes Father   . Cancer Maternal Uncle        pancreatic cancer   . Diabetes Paternal Grandmother   . Diabetes Paternal Grandfather   . Breast cancer Sister     Social History   Tobacco Use  . Smoking status: Former Smoker    Packs/day: 0.00    Years: 0.00    Pack years: 0.00    Quit date: 05/01/1980    Years since quitting: 40.3  . Smokeless tobacco: Never Used  Vaping Use  . Vaping Use: Never used  Substance Use Topics  . Alcohol use: Yes    Alcohol/week: 0.0 standard drinks    Comment: occasionally  . Drug use: No    Home Medications Prior to Admission  medications   Medication Sig Start Date End Date Taking? Authorizing Provider  albuterol (PROVENTIL HFA;VENTOLIN HFA) 108 (90 BASE) MCG/ACT inhaler Inhale into the lungs every 6 (six) hours as needed for wheezing or shortness of breath.    [provider]  Alpha Lipoic Acid 200 MG CAPS Take 600 mg by mouth 2 (two) times daily.     [provider]  Ascorbic Acid (VITAMIN C) 1000 MG tablet Take 2,000-3,000 mg by mouth 2 (two) times daily. Takes 2000mg  in the morning and 3000mg  at night    [provider]  calcium carbonate (OS-CAL) 600 MG TABS tablet Take 800 mg by mouth 2 (two) times daily with a meal.    [provider]  cetirizine (ZYRTEC) 10 MG tablet Take 10 mg by mouth daily.     [provider]  clonazePAM (KLONOPIN) 1 MG tablet  Take 1 tablet (1 mg total) by mouth at bedtime. 03/01/20   Marrian Salvage, FNP  Coenzyme Q10 200 MG capsule Take 200 mg by mouth daily.    [provider]  cyclobenzaprine (FLEXERIL) 10 MG tablet TAKE 1 TABLET BY MOUTH THREE TIMES A DAY AS NEEDED FOR MUSCLE SPASMS *INS MAX 21 DAY SUPPLY* 06/25/20   Marrian Salvage, FNP  cycloSPORINE (RESTASIS) 0.05 % ophthalmic emulsion Place 1 drop into both eyes 2 (two) times daily. 05/22/18   [provider]  Flaxseed, Linseed, (FLAXSEED OIL) 1000 MG CAPS Take 1,000 mg by mouth daily.     [provider]  fluticasone (FLONASE) 50 MCG/ACT nasal spray Place 1 spray into both nostrils daily. 02/23/15   [provider]  guaiFENesin (MUCINEX) 600 MG 12 hr tablet Take 600 mg by mouth daily.     [provider]  ibuprofen (ADVIL,MOTRIN) 200 MG tablet Take 600 mg by mouth every 4 (four) hours as needed for fever, headache, mild pain, moderate pain or cramping.    [provider]  liothyronine (CYTOMEL) 25 MCG tablet Take 1 tablet (25 mcg total) by mouth daily. 2 in the am; 1 at noon 05/19/20   Shamleffer, Melanie Crazier, MD  MAGNESIUM LACTATE PO Take 2 tablets by mouth at bedtime.    [provider]  metFORMIN (GLUCOPHAGE-XR) 500 MG 24 hr tablet Take 1,000 mg by mouth 2 (two) times daily.  02/08/15   [provider]  montelukast (SINGULAIR) 10 MG tablet Take 1 tablet (10 mg total) by mouth at bedtime. 03/01/20   Marrian Salvage, FNP  niacin 500 MG tablet Take 500 mg by mouth daily.     [provider]  ondansetron (ZOFRAN ODT) 4 MG disintegrating tablet Take 1 tablet (4 mg total) by mouth every 8 (eight) hours as needed for nausea or vomiting. 03/22/18   Nuala Alpha A, PA-C  OVER THE COUNTER MEDICATION Take 1-2 tablets by mouth 2 (two) times daily. *Nutrient 950 with NAC2* takes 2 tablets in the morning and 1 tablet at lunchtime    [provider]  OVER THE  COUNTER MEDICATION Take 2 tablets by mouth daily with breakfast. *LV GB Complex*    [provider]  OVER THE COUNTER MEDICATION Take 1 tablet by mouth 2 (two) times daily. *Regenemax or Biosil*    [provider]  OVER THE COUNTER MEDICATION Take 30 mg by mouth daily. *Borotab*    [provider]  STRONTIUM GLUCONATE-B6-B12-FA PO Take 2 tablets by mouth daily.     [provider]  triamcinolone cream (KENALOG) 0.1 %  Apply 1 application topically 2 (two) times daily. 11/18/19   Gregor Hams, MD  valACYclovir (VALTREX) 1000 MG tablet TAKE 2 TABLETS EVERY MORNING AND TAKE 2 TABLETS EVERY EVENING FOR 1 DAY AS NEEDED FOR FLARE 03/12/19   [provider]  Vitamin D, Ergocalciferol, (DRISDOL) 50000 units CAPS capsule Take 50,000 Units by mouth every Sunday.    [provider]  VITAMIN K, PHYTONADIONE, PO Take 2 tablets by mouth daily.     [provider]  zinc gluconate 50 MG tablet Take 50-100 mg by mouth 2 (two) times daily. Takes 100mg  in the morning and 50mg  later in the day    [provider]    Allergies    Other, Celery oil, Doxycycline, [place]ye grass flower pollen extract [gramineae pollens], Sulfa antibiotics, Tea, and Adhesive [tape]  Review of Systems   Review of Systems  Physical Exam Updated Vital Signs BP 135/77   Pulse 93   Temp 99 F (37.2 C) (Oral)   Resp 15   Ht 5\' 3"  (1.6 m)   SpO2 98%   BMI 21.43 kg/m   Physical Exam Neurological:     Comments: 2022, Tuesday, April     ED Results / Procedures / Treatments   Labs (all labs ordered are listed, but only abnormal results are displayed) Labs Reviewed  BASIC METABOLIC PANEL - Abnormal; Notable for the following components:      Result Value   Sodium 131 (*)    Chloride 97 (*)    CO2 21 (*)    Glucose, Bld 115 (*)    Creatinine, Ser 1.59 (*)    Calcium 8.6 (*)    GFR, Estimated 35 (*)    All other components within normal limits  CBC WITH  DIFFERENTIAL/PLATELET - Abnormal; Notable for the following components:   WBC 16.9 (*)    Neutro Abs 13.7 (*)    Monocytes Absolute 1.1 (*)    Abs Immature Granulocytes 0.12 (*)    All other components within normal limits  URINALYSIS, ROUTINE W REFLEX MICROSCOPIC - Abnormal; Notable for the following components:   Ketones, ur 5 (*)    Protein, ur 100 (*)    All other components within normal limits  MAGNESIUM    EKG EKG Interpretation  Date/Time:  Friday August 13 2020 19:03:48 EDT Ventricular Rate:  107 PR Interval:  149 QRS Duration: 76 QT Interval:  350 QTC Calculation: 467 R Axis:   10 Text Interpretation: Sinus tachycardia Confirmed by Lavenia Atlas [phone]) on 08/13/2020 7:12:21 PM   Radiology CT Head Wo Contrast  Result Date: 08/13/2020 CLINICAL DATA:  Recent trauma, worsening confusion EXAM: CT HEAD WITHOUT CONTRAST TECHNIQUE: Contiguous axial images were obtained from the base of the skull through the vertex without intravenous contrast. COMPARISON:  08/12/2020 FINDINGS: Brain: Stable hypodensities throughout the periventricular white matter consistent with chronic small vessel ischemic changes. No signs of acute infarct or hemorrhage. Stable diffuse cerebral atrophy with ex vacuo dilatation of the lateral ventricles. The lateral ventricles and midline structures are otherwise unremarkable. No acute extra-axial fluid collections. No mass effect. Vascular: No hyperdense vessel or unexpected calcification. Skull: Normal. Negative for fracture or focal lesion. Sinuses/Orbits: No acute finding. Other: None. IMPRESSION: 1. Stable head CT, no acute process. Electronically Signed   By: Randa Ngo M.D.   On: 08/13/2020 21:08   CT Head Wo Contrast  Result Date: 08/12/2020 CLINICAL DATA:  70 year old female with history of trauma from a fall with contusion to  the left maxillary region. EXAM: CT HEAD WITHOUT CONTRAST CT MAXILLOFACIAL WITHOUT CONTRAST CT CERVICAL SPINE WITHOUT  CONTRAST TECHNIQUE: Multidetector CT imaging of the head, cervical spine, and maxillofacial structures were performed using the standard protocol without intravenous contrast. Multiplanar CT image reconstructions of the cervical spine and maxillofacial structures were also generated. COMPARISON:  No priors. FINDINGS: CT HEAD FINDINGS Brain: Mild-to-moderate cerebral and mild cerebellar atrophy with ex vacuo dilatation of the ventricular system. Patchy and confluent areas of decreased attenuation are noted throughout the deep and periventricular white matter of the cerebral hemispheres bilaterally, compatible with chronic microvascular ischemic disease. No evidence of acute infarction, hemorrhage, hydrocephalus, extra-axial collection or mass lesion/mass effect. Vascular: No hyperdense vessel or unexpected calcification. Skull: Normal. Negative for fracture or focal lesion. Other: None. CT MAXILLOFACIAL FINDINGS Osseous: No fracture or mandibular dislocation. No destructive process. Orbits: Negative. No traumatic or inflammatory finding. Sinuses: Clear. Soft tissues: Some soft tissue calcifications are noted in the right pre maxillary region, likely sequela of remote trauma. CT CERVICAL SPINE FINDINGS Alignment: Normal. Skull base and vertebrae: No acute fracture. No primary bone lesion or focal pathologic process. Soft tissues and spinal canal: No prevertebral fluid or swelling. No visible canal hematoma. Disc levels: Mild degenerative disc disease with a Schmorl's node in the superior endplate of the T1 vertebral body. Upper chest: Unremarkable. Other: None. IMPRESSION: 1. No evidence of significant acute traumatic injury to the skull, brain, facial bones or cervical spine. 2. Mild to moderate cerebral and mild cerebellar atrophy with ex vacuo dilatation of the ventricular system. 3. Extensive chronic microvascular ischemic changes in the cerebral white matter, as above. 4. Additional incidental findings, as above.  Electronically Signed   By: Vinnie Langton M.D.   On: 08/12/2020 17:22   CT Cervical Spine Wo Contrast  Result Date: 08/12/2020 CLINICAL DATA:  70 year old female with history of trauma from a fall with contusion to the left maxillary region. EXAM: CT HEAD WITHOUT CONTRAST CT MAXILLOFACIAL WITHOUT CONTRAST CT CERVICAL SPINE WITHOUT CONTRAST TECHNIQUE: Multidetector CT imaging of the head, cervical spine, and maxillofacial structures were performed using the standard protocol without intravenous contrast. Multiplanar CT image reconstructions of the cervical spine and maxillofacial structures were also generated. COMPARISON:  No priors. FINDINGS: CT HEAD FINDINGS Brain: Mild-to-moderate cerebral and mild cerebellar atrophy with ex vacuo dilatation of the ventricular system. Patchy and confluent areas of decreased attenuation are noted throughout the deep and periventricular white matter of the cerebral hemispheres bilaterally, compatible with chronic microvascular ischemic disease. No evidence of acute infarction, hemorrhage, hydrocephalus, extra-axial collection or mass lesion/mass effect. Vascular: No hyperdense vessel or unexpected calcification. Skull: Normal. Negative for fracture or focal lesion. Other: None. CT MAXILLOFACIAL FINDINGS Osseous: No fracture or mandibular dislocation. No destructive process. Orbits: Negative. No traumatic or inflammatory finding. Sinuses: Clear. Soft tissues: Some soft tissue calcifications are noted in the right pre maxillary region, likely sequela of remote trauma. CT CERVICAL SPINE FINDINGS Alignment: Normal. Skull base and vertebrae: No acute fracture. No primary bone lesion or focal pathologic process. Soft tissues and spinal canal: No prevertebral fluid or swelling. No visible canal hematoma. Disc levels: Mild degenerative disc disease with a Schmorl's node in the superior endplate of the T1 vertebral body. Upper chest: Unremarkable. Other: None. IMPRESSION: 1. No  evidence of significant acute traumatic injury to the skull, brain, facial bones or cervical spine. 2. Mild to moderate cerebral and mild cerebellar atrophy with ex vacuo dilatation of the ventricular system. 3. Extensive chronic microvascular  ischemic changes in the cerebral white matter, as above. 4. Additional incidental findings, as above. Electronically Signed   By: Vinnie Langton M.D.   On: 08/12/2020 17:22   DG Chest Port 1 View  Result Date: 08/13/2020 CLINICAL DATA:  Altered mental status EXAM: PORTABLE CHEST 1 VIEW COMPARISON:  None. FINDINGS: The heart size and mediastinal contours are within normal limits. Both lungs are clear. The visualized skeletal structures are unremarkable. Postsurgical changes in the left breast and axilla are noted. IMPRESSION: No active disease. Electronically Signed   By: Inez Catalina M.D.   On: 08/13/2020 22:15   CT Maxillofacial Wo Contrast  Result Date: 08/12/2020 CLINICAL DATA:  70 year old female with history of trauma from a fall with contusion to the left maxillary region. EXAM: CT HEAD WITHOUT CONTRAST CT MAXILLOFACIAL WITHOUT CONTRAST CT CERVICAL SPINE WITHOUT CONTRAST TECHNIQUE: Multidetector CT imaging of the head, cervical spine, and maxillofacial structures were performed using the standard protocol without intravenous contrast. Multiplanar CT image reconstructions of the cervical spine and maxillofacial structures were also generated. COMPARISON:  No priors. FINDINGS: CT HEAD FINDINGS Brain: Mild-to-moderate cerebral and mild cerebellar atrophy with ex vacuo dilatation of the ventricular system. Patchy and confluent areas of decreased attenuation are noted throughout the deep and periventricular white matter of the cerebral hemispheres bilaterally, compatible with chronic microvascular ischemic disease. No evidence of acute infarction, hemorrhage, hydrocephalus, extra-axial collection or mass lesion/mass effect. Vascular: No hyperdense vessel or  unexpected calcification. Skull: Normal. Negative for fracture or focal lesion. Other: None. CT MAXILLOFACIAL FINDINGS Osseous: No fracture or mandibular dislocation. No destructive process. Orbits: Negative. No traumatic or inflammatory finding. Sinuses: Clear. Soft tissues: Some soft tissue calcifications are noted in the right pre maxillary region, likely sequela of remote trauma. CT CERVICAL SPINE FINDINGS Alignment: Normal. Skull base and vertebrae: No acute fracture. No primary bone lesion or focal pathologic process. Soft tissues and spinal canal: No prevertebral fluid or swelling. No visible canal hematoma. Disc levels: Mild degenerative disc disease with a Schmorl's node in the superior endplate of the T1 vertebral body. Upper chest: Unremarkable. Other: None. IMPRESSION: 1. No evidence of significant acute traumatic injury to the skull, brain, facial bones or cervical spine. 2. Mild to moderate cerebral and mild cerebellar atrophy with ex vacuo dilatation of the ventricular system. 3. Extensive chronic microvascular ischemic changes in the cerebral white matter, as above. 4. Additional incidental findings, as above. Electronically Signed   By: Vinnie Langton M.D.   On: 08/12/2020 17:22    Procedures Procedures {Remember to document critical care time when appropriate:1}  Medications Ordered in ED Medications  sodium chloride 0.9 % bolus 1,000 mL (has no administration in time range)    ED Course  I have reviewed the triage vital signs and the nursing notes.  Pertinent labs & imaging results that were available during my care of the patient were reviewed by me and considered in my medical decision making (see chart for details).    MDM Rules/Calculators/A&P                          *** Final Clinical Impression(s) / ED Diagnoses Final diagnoses:  None    Rx / DC Orders ED Discharge Orders    None

## 2020-08-13 NOTE — ED Triage Notes (Signed)
Pt BIB [facility] from home. Pt was seen at [facility] yesterday for a fall, CT was normal. Since coming home family felt like pt has and increased weakness and confusion and not able to answer simple questions. Pt is alert and oriented x4 currently. Pt states she has pain in her right groin area. EMS also stated pt's resting HR was 150.

## 2020-08-14 ENCOUNTER — Other Ambulatory Visit: Payer: Self-pay | Admitting: Family Medicine

## 2020-08-14 NOTE — ED Notes (Signed)
[facility] called  

## 2020-08-14 NOTE — Discharge Instructions (Addendum)
Please read instructions below. You can treat your headache with over-the-counter medications such as tylenol as needed. Stay hydrated and get plenty of rest. Limit your screen time and complex thinking. Avoid any contact sports/activities to prevent re-injury to your head. Follow up with your primary care provider early next week for follow-up and re-check of your electrolytes and kidney function.   Return to the ER if you develop severely worsening headache, changes in your vision, persistent vomiting, or new or concerning symptoms.

## 2020-08-16 ENCOUNTER — Telehealth: Payer: Self-pay | Admitting: Family Medicine

## 2020-08-16 NOTE — Telephone Encounter (Signed)
Please advise 

## 2020-08-16 NOTE — Telephone Encounter (Signed)
Patient left a message over the weekend wanting to let Dr Tamala Julian know that she was having to call EMS because she could not walk. Her speech during the voicemail was very slow and she seemed somewhat confused. Appears she was seen in the ED twice this weekend, once for a fall and the other for a concussion.  Just FYI.

## 2020-08-17 NOTE — Telephone Encounter (Signed)
Please schedule patient with me soon

## 2020-08-17 NOTE — Telephone Encounter (Signed)
Whoever calls this pt to schedule, please make it a 30 min appt.  Thanks

## 2020-08-17 NOTE — Telephone Encounter (Signed)
Appointment scheduled as a phone visit with Dr Georgina Snell on Thursday (she is not able to come into the office at this time).

## 2020-08-17 NOTE — Telephone Encounter (Signed)
Home Health Nurse called from pt home, changing to in person visit per her request.

## 2020-08-18 ENCOUNTER — Ambulatory Visit: Payer: PPO | Admitting: Internal Medicine

## 2020-08-18 NOTE — Progress Notes (Deleted)
Name: Christina [name]  MRN/ DOB: [id number], Jun 03, 1950    Age/ Sex: 70 y.o., female     PCP: Marrian Salvage, [place]   Reason for Endocrinology Evaluation: Hypothyroidism     Initial Endocrinology Clinic Visit: 05/19/2020    PATIENT IDENTIFIER: Christina [name] is a 71 y.o., female with a past medical history of Breast ca ( S/P lumpectomy 2016). She has followed with [facility] since 05/19/2020 for consultative assistance with management of her hypothyroidism  HISTORICAL SUMMARY:  Christina [name] used to follow up with a holistic doctor Dr. Sharol Roussel for year. She was diagnosed with hypothyroidism through holistic medicine, she was initially put on Armour thyroid but when this didn't work she was started on Synthroid,  but that had to be stopped due to hair loss and was started on LT3 replacement which she has been on since at least 15 yrs.   She has been noted to have a low TSh during labs in 03/2020 at 0.02 uIU/mL . Per pt this was done on purpose  ?  Through Dr. Sharol Roussel . She could not tell me the purpose of the suppression.   I have reduced her liothyronine 25 mcg from BID to once daily dosing.     She also tells me she has been diagnosed with adrenal insufficiency through Dr. Sharol Roussel, was started on hydrocortisone at first but this was subsequently switched to medrol per pt. She tells me she has been taking it for years and has not missed a dose. [facility] was in 2019 ?Marland Kitchen ACTH was normal at  18 pg/mL and cortisol normal at 12.2 ug/dL at 1600     She believes mother and sister with thyroid disease but no official diagnosis  SUBJECTIVE:    Today (08/18/2020):  Christina [name] is here for follow up on hypothyroidism.     HISTORY:  Past Medical History:  Past Medical History:  Diagnosis Date  . Abrasion of right leg 12/17/2014  . Adrenal insufficiency ([place])   . Breast cancer of upper-outer quadrant of left female breast ([place])  12/07/2014  . Cataract, immature    bilateral  . Dental bridge present    upper - x 2  . Dental crowns present   . Hypothyroidism   . Personal history of radiation therapy   . Prediabetes     Past Surgical History:  Past Surgical History:  Procedure Laterality Date  . ABDOMINOPLASTY    . BALLOON SINUPLASTY    . BREAST LUMPECTOMY Left 2016  . BREAST LUMPECTOMY WITH RADIOACTIVE SEED AND SENTINEL LYMPH NODE BIOPSY Left 12/22/2014   Procedure: BREAST LUMPECTOMY WITH RADIOACTIVE SEED AND SENTINEL LYMPH NODE BIOPSY;  Surgeon: Stark Klein, MD;  Location: [facility];  Service: General;  Laterality: Left;  . BUNIONECTOMY Right   . HAMMER TOE SURGERY Bilateral   . RHINOPLASTY    . SEPTOPLASTY       Social History:  reports that she quit smoking about 40 years ago. She smoked 0.00 packs per day for 0.00 years. She has never used smokeless tobacco. She reports current alcohol use. She reports that she does not use drugs. Family History:  Family History  Problem Relation Age of Onset  . Diabetes Mother   . Diabetes Father   . Cancer Maternal Uncle        pancreatic cancer   . Diabetes Paternal Grandmother   . Diabetes Paternal Grandfather   . Breast cancer  Sister       HOME MEDICATIONS: Allergies as of 08/18/2020      Reactions   Other Other (See Comments)   Tea Throat swelling   Celery Oil Other (See Comments)   FEVER BLISTERS   Doxycycline Nausea And Vomiting   Rye Grass Flower Pollen Extract [gramineae Pollens] Swelling   RYE:  SWELLING THROAT - DENIES SOB   Sulfa Antibiotics Swelling   NOSE   Tea Swelling   THROAT - DENIES SOB   Adhesive [tape] Other (See Comments)   TEARS SKIN      Medication List       Accurate as of August 18, 2020  7:13 AM. If you have any questions, ask your nurse or doctor.        albuterol 108 (90 Base) MCG/ACT inhaler Commonly known as: VENTOLIN HFA Inhale into the lungs every 6 (six) hours as needed for wheezing or  shortness of breath.   Alpha Lipoic Acid 200 MG Caps Take 200 mg by mouth daily.   calcium carbonate 600 MG Tabs tablet Commonly known as: OS-CAL Take 600 mg by mouth 2 (two) times daily with a meal.   cetirizine 10 MG tablet Commonly known as: ZYRTEC Take 10 mg by mouth at bedtime.   clonazePAM 1 MG tablet Commonly known as: KLONOPIN Take 1 tablet (1 mg total) by mouth at bedtime.   Coenzyme Q10 200 MG capsule Take 200 mg by mouth daily.   cyclobenzaprine 10 MG tablet Commonly known as: FLEXERIL TAKE 1 TABLET BY MOUTH THREE TIMES A DAY AS NEEDED FOR MUSCLE SPASMS *INS MAX 21 DAY SUPPLY* What changed: See the new instructions.   Flaxseed Oil 1000 MG Caps Take 1,000 mg by mouth daily.   fluticasone 50 MCG/ACT nasal spray Commonly known as: FLONASE Place 1 spray into both nostrils at bedtime.   guaiFENesin 600 MG 12 hr tablet Commonly known as: MUCINEX Take 600 mg by mouth daily.   ibuprofen 200 MG tablet Commonly known as: ADVIL Take 600 mg by mouth every 4 (four) hours as needed for fever, headache, mild pain, moderate pain or cramping.   liothyronine 25 MCG tablet Commonly known as: CYTOMEL Take 1 tablet (25 mcg total) by mouth daily. 2 in the am; 1 at noon What changed:   how much to take  additional instructions   MAGNESIUM LACTATE PO Take 2 tablets by mouth at bedtime.   metFORMIN 500 MG 24 hr tablet Commonly known as: GLUCOPHAGE-XR Take 1,000 mg by mouth 2 (two) times daily.   montelukast 10 MG tablet Commonly known as: SINGULAIR Take 1 tablet (10 mg total) by mouth at bedtime.   niacin 500 MG tablet Take 500 mg by mouth daily.   ondansetron 4 MG disintegrating tablet Commonly known as: Zofran ODT Take 1 tablet (4 mg total) by mouth every 8 (eight) hours as needed for nausea or vomiting.   OVER THE COUNTER MEDICATION Take 1-2 tablets by mouth 2 (two) times daily. *Nutrient 950 with NAC2* takes 2 tablets in the morning and 1 tablet at  lunchtime   OVER THE COUNTER MEDICATION Take 2 tablets by mouth daily with breakfast. *LV GB Complex*   OVER THE COUNTER MEDICATION Take 1 tablet by mouth 2 (two) times daily. *Regenemax or Biosil*   OVER THE COUNTER MEDICATION Take 30 mg by mouth daily. *Borotab*   Restasis 0.05 % ophthalmic emulsion Generic drug: cycloSPORINE Place 1 drop into both eyes 2 (two) times daily.   STRONTIUM GLUCONATE-B6-B12-FA  PO Take 2 tablets by mouth daily.   triamcinolone cream 0.1 % Commonly known as: KENALOG Apply 1 application topically 2 (two) times daily as needed (itching).   valACYclovir 1000 MG tablet Commonly known as: VALTREX TAKE 2 TABLETS EVERY MORNING AND TAKE 2 TABLETS EVERY EVENING FOR 1 DAY AS NEEDED FOR FLARE   vitamin C 1000 MG tablet Take 2,000 mg by mouth daily.   Vitamin D (Ergocalciferol) 1.25 MG (50000 UNIT) Caps capsule Commonly known as: DRISDOL Take 50,000 Units by mouth every Sunday.   VITAMIN K (PHYTONADIONE) PO Take 2 tablets by mouth daily.   zinc gluconate 50 MG tablet Take 100 mg by mouth daily.         OBJECTIVE:   PHYSICAL EXAM: VS: There were no vitals taken for this visit.   EXAM: General: Pt appears well and is in NAD  Neck: General: Supple without adenopathy. Thyroid: Thyroid size normal.  No goiter or nodules appreciated.   Lungs: Clear with good BS bilat with no rales, rhonchi, or wheezes  Heart: Auscultation: RRR.  Abdomen: Normoactive bowel sounds, soft, nontender, without masses or organomegaly palpable  Extremities:  BL LE: No pretibial edema normal ROM and strength.  Mental Status: Judgment, insight: Intact Orientation: Oriented to time, place, and person Memory: Intact for recent and remote events Mood and affect: No depression, anxiety, or agitation     DATA REVIEWED: ***    ASSESSMENT / PLAN / RECOMMENDATIONS:   1. Hypothyroidism :  - Pt on LT-3 replacement only, and has been for decades. - Armour thyroid was  not effective and synthroid  - We discussed risk of  Cardiac arrhythmia (atrial fibrillation) , and consequences of increased risk of CVA and CHF with over-replacement of thyroid hormone.  - I have advised her to reduce Liothyronine as below  - I have recommended switching to LT-4 replacement but will defer this until her TSh is elevated  - Confirmed pick up history from the pharmacy , has been prescribed by Modena Nunnery , MD    Medications : Decrease Liothyronine 25 mcg to 1 tablet daily     2. Adrenal Insufficiency :  - I am not clear on the circumstances of her diagnosis, this has been decades ago , but knowing she came from a holistic doctor, most like she has been diagnosed with " adrenal fatigue" I explained to the pt that the Endocrine Society does NOT believe in the existence of adrenal fatigue but at any point, if she has been on medrol for years , I suspect she has secondary adrenal insufficiency.  - Pt assures me compliance with daily 4 mg tabs of Medrol , but I am not sure where she is getting it from ?, as she stated [facility]  [facility] dating back to [facility].  - Pt understands that I will not be refilling Medrol and if she does indeed has secondary adrenal insufficiency, will switch it to hydrocortisone .  - Cortisol is normal     Signed electronically by: Mack Guise, MD  [facility]  [address] [place], [place] [address] Phone: [phone] FAX: [phone]      CC: Marrian Salvage, [place] [address] Phone: [phone]  Fax: [phone]   Return to Endocrinology clinic as below: Future Appointments  Date Time Provider [place]  08/18/2020 10:30 AM  Desi Carby, Melanie Crazier, MD [facility] None  08/19/2020 11:15 AM Gregor Hams, MD [facility] None  09/07/2020 11:00 AM Gregor Hams, MD  [facility] None

## 2020-08-19 ENCOUNTER — Encounter: Payer: Self-pay | Admitting: Family Medicine

## 2020-08-19 ENCOUNTER — Telehealth: Payer: Self-pay | Admitting: *Deleted

## 2020-08-19 ENCOUNTER — Other Ambulatory Visit: Payer: Self-pay

## 2020-08-19 ENCOUNTER — Ambulatory Visit (INDEPENDENT_AMBULATORY_CARE_PROVIDER_SITE_OTHER): Payer: PPO | Admitting: Family Medicine

## 2020-08-19 VITALS — BP 124/80 | HR 74 | Ht 63.0 in | Wt 121.0 lb

## 2020-08-19 DIAGNOSIS — R296 Repeated falls: Secondary | ICD-10-CM

## 2020-08-19 DIAGNOSIS — S060X0A Concussion without loss of consciousness, initial encounter: Secondary | ICD-10-CM

## 2020-08-19 DIAGNOSIS — R269 Unspecified abnormalities of gait and mobility: Secondary | ICD-10-CM | POA: Diagnosis not present

## 2020-08-19 DIAGNOSIS — S3421XD Injury of nerve root of lumbar spine, subsequent encounter: Secondary | ICD-10-CM | POA: Diagnosis not present

## 2020-08-19 MED ORDER — AMBULATORY NON FORMULARY MEDICATION: 0 refills | Status: DC

## 2020-08-19 NOTE — Telephone Encounter (Signed)
Revised rx has been faxed to [facility].

## 2020-08-19 NOTE — Addendum Note (Signed)
Addended by: Gregor Hams on: 08/19/2020 01:41 PM   Modules accepted: Orders

## 2020-08-19 NOTE — Progress Notes (Signed)
Subjective:    Chief Complaint: Christina [name],  is a 70 y.o. female who presents for evaluation of a head injury that occurred as a result of a fall she suffered on 08/12/20 when she slipped on some cat litter on the floor and fell backwards, striking the L side of her head and face on the floor.  She was seen at the [facility] ED on 08/12/20 and diagnosed w/ a L maxillary/zygomatic contusion.  She then went to the [facility] ED on 08/13/20 due to "feeling weird" and was diagnosed w/ a possible concussion despite the "weird" feeling resolving at the ED.  She was found to be tachycardic and had IV fluids while at the ED.  Today, she states that her biggest c/o is that she can't walk due to weakness and due to feeling like she can't physically move one leg in front of the other.  She denies any HA but does note some memory issues.  She is not taking any new medications from previously.  She has been having some issues w/ her sleep but this has been due to her R leg pain.  Additionally she is here today with her sister and a friend who is a power of attorney. Everyone is very concerned. Donia Guiles is confused and not making good decisions and is unsafe at home.  She is very unstable with gait and needs 24-hour observation and assistance.  Her sister can stay for about another week but after another week or so she will not have 24-hour assistance.  They have contacted her health insurance company who has arranged for a nurse navigator but there is still a lot of uncertainty as to her safety and future plans at home.  Injury date : 08/12/20 Visit #: 1   History of Present Illness:    Concussion Self-Reported Symptom Score Symptoms rated on a scale 1-6, in last 24 hours   Headache: 0    Nausea: 5  Dizziness: 5  Vomiting: 0  Balance Difficulty: 6   Trouble Falling Asleep: 0   Fatigue: 5  Sleep Less Than Usual: 3  Daytime Drowsiness: 4  Sleep More Than Usual: 0  Photophobia: 0  Phonophobia: 0   Irritability: 5  Sadness: 0  Numbness or Tingling: 0  Nervousness: 5  Feeling More Emotional: 0  Feeling Mentally Foggy: 4  Feeling Slowed Down: 5  Memory Problems: 4  Difficulty Concentrating: 5  Visual Problems: 0   Total # of Symptoms: 13/22 Total Symptom Score: 56/132    Neck Pain: No  Tinnitus: No  Review of Systems: No fevers or chills    Review of History: Persistent right leg weakness thought to be due to L2 or L3 radiculopathy.  Objective:    Physical Examination Vitals:   08/19/20 1111  BP: 124/80  Pulse: 74  SpO2: 98%   MSK: Contusion left face. Skin tear right anterior knee Significant weakness to right hip flexion 3/5 Knee extension strength reduced 4/5 Sensation is intact distally. Neuro: Alert.  Oriented.  Slow to respond.    Radiology:  EXAM: CT HEAD WITHOUT CONTRAST  TECHNIQUE: Contiguous axial images were obtained from the base of the skull through the vertex without intravenous contrast.  COMPARISON:  08/12/2020  FINDINGS: Brain: Stable hypodensities throughout the periventricular white matter consistent with chronic small vessel ischemic changes. No signs of acute infarct or hemorrhage. Stable diffuse cerebral atrophy with ex vacuo dilatation of the lateral ventricles. The lateral ventricles and midline structures are  otherwise unremarkable. No acute extra-axial fluid collections. No mass effect.  Vascular: No hyperdense vessel or unexpected calcification.  Skull: Normal. Negative for fracture or focal lesion.  Sinuses/Orbits: No acute finding.  Other: None.  IMPRESSION: 1. Stable head CT, no acute process.   Electronically Signed   By: Randa Ngo M.D.   On: 08/13/2020 21:08  I, Lynne Leader, personally (independently) visualized and performed the interpretation of the images attached in this note.  EXAM: MRI LUMBAR SPINE WITHOUT CONTRAST  TECHNIQUE: Multiplanar, multisequence MR imaging of the  lumbar spine was performed. No intravenous contrast was administered.  COMPARISON:  Lumbar spine radiographs 02/25/2020. Lumbar spine MRI 11/02/2016.  FINDINGS: Segmentation: For the purposes of this dictation, five lumbar vertebrae are assumed and the caudal most well-formed intervertebral disc is designated L5-S1. Spinal numbering will remain consistent with that utilized on the prior MRI of 11/02/2016.  Alignment: Straightening of the expected lumbar lordosis. No significant spondylolisthesis.  Vertebrae: Vertebral body height is maintained. No significant marrow edema or focal suspicious osseous lesion.  Conus medullaris and cauda equina: Conus extends to the T12-L1 level. No signal abnormality within the visualized distal spinal cord.  Paraspinal and other soft tissues: No abnormality identified within included portions of the abdomen/retroperitoneum. Paraspinal soft tissues within normal limits.  Disc levels:  Unless otherwise stated, the level by level findings below have not significantly changed since prior MRI 11/02/2016.  Congenitally narrow lumbar spinal canal on the basis of short pedicles.  Multilevel disc degeneration. Most notably, there is mild/moderate disc degeneration at L2-L3 and L3-L4.  T12-L1: No significant disc herniation or stenosis.  L1-L2: A previously described 4 mm T2 hyperintense lesion within the right extraforaminal region is no longer definitively appreciated. Minimal disc bulge. Mild facet arthrosis. No significant spinal canal or foraminal narrowing.  L2-L3: Disc bulge with endplate spurring. Superimposed small right foraminal disc protrusion. Mild facet arthrosis/ligamentum flavum hypertrophy. Mild/moderate bilateral subarticular narrowing without frank nerve root impingement. Mild relative narrowing of the central canal. Mild right neural foraminal narrowing.  L3-L4: Disc bulge. Superimposed right  foraminal/extraforaminal cranially migrated disc extrusion. Mild facet arthrosis/ligamentum flavum hypertrophy. Mild/moderate bilateral subarticular stenosis without frank nerve root impingement. Mild relative narrowing of the central canal. As before, the disc extrusion contributes to severe right neural foraminal narrowing with encroachment upon the exiting right L3 nerve root. Mild/moderate left neural foraminal narrowing.  L4-L5: Disc bulge with mild endplate spurring. Mild facet arthrosis. Mild bilateral subarticular narrowing without nerve root impingement. Minimal relative narrowing of the central canal. Mild right neural foraminal narrowing. Moderate to moderately severe left neural foraminal narrowing, slightly progressed.  L5-S1: Minimal facet arthrosis. No significant disc herniation or stenosis.  IMPRESSION: Lumbar spondylosis as outlined with findings most notably as follows. Apart from slight progression of moderate to moderately severe left L4-L5 neural foraminal narrowing, findings have not significantly changed since the MRI of 11/02/2016  At L3-L4, there is mild/moderate disc degeneration. Unchanged right foraminal/extraforaminal cranially migrated disc extrusion contributing to severe right neural foraminal narrowing, approaching upon the exiting right L3 nerve root. Multifactorial mild/moderate bilateral subarticular narrowing without frank nerve root impingement. Mild/moderate left neural foraminal narrowing.  At L2-L3, there is mild/moderate disc degeneration. Multifactorial mild/moderate bilateral subarticular narrowing without frank nerve root impingement. Mild right neural foraminal narrowing.  At L4-L5, there is multifactorial moderate to moderately severe left neural foraminal narrowing, slightly progressed. Multifactorial mild spinal canal and right neural foraminal narrowing.   Electronically Signed   By: Kellie Simmering DO  On: 03/07/2020  15:49  I, Lynne Leader, personally (independently) visualized and performed the interpretation of the images attached in this note.   Assessment and Plan   70 y.o. female with fall with concussion.  Concussion is causing confusion and delayed processing.  I expect this will improve over the next week or so.  The bigger concern however is her frequent falls and persistent leg weakness.  This is been ongoing for months and has not been doing well.  This is thought to be due to L2 or L3 lumbar radiculopathy seen on lumbar MRI November 2021.  Trials of epidural steroid injection and physical therapy with little benefit.  She was somewhat lost to follow-up until recently.    Currently she is not safe to be at home independently.  She has her sister come in and out of town to stay with her temporarily and has a friend who is a power of attorney helping as well however this is not a long-term solution.  I have contacted home health for home health assistance and she has contracted a nurse care manager that may help.  Additionally and referred to neurosurgery to discuss surgical options.  I believe that ultimately in order to live independently she will need surgery to regain strength of her right leg.  Reassess in 1 week.  Ultimately if she runs out of time she may need to be in a nursing home for safety or may need admission to the hospital for safety.  Contact numbers. Sister: Sima Matas [phone] Friend POA: [phone]      Action/Discussion: Reviewed diagnosis, management options, expected outcomes, and the reasons for scheduled and emergent follow-up. Questions were adequately answered. Patient expressed verbal understanding and agreement with the following plan.     Patient Education:  Reviewed with patient the risks (i.e, a repeat concussion, post-concussion syndrome, second-impact syndrome) of returning to play prior to complete resolution, and thoroughly reviewed the signs and  symptoms of concussion.Reviewed need for complete resolution of all symptoms, with rest AND exertion, prior to return to play.  Reviewed red flags for urgent medical evaluation: worsening symptoms, nausea/vomiting, intractable headache, musculoskeletal changes, focal neurological deficits.  [facility] Concussion Care Plan, which clearly outlines the plans stated above, was given to patient.    Total encounter time 50 minutes including face-to-face time with the patient and, reviewing past medical record, and charting on the date of service.    Discussion with family and caregivers.   After Visit Summary printed out and provided to patient as appropriate.  The above documentation has been reviewed and is accurate and complete Lynne Leader

## 2020-08-19 NOTE — Patient Instructions (Addendum)
Thank you for coming in today.  I've referred you to Physical Therapy.  Let us know if you don't hear from them in one week.  I am referring to neurosurgery.   Get a commode and a regular walker.   Please go to [facility] supply to get the devices we talked about today. You may also be able to get it from [employer] [facility].   Recheck in 1 week.

## 2020-08-19 NOTE — Telephone Encounter (Signed)
[facility] called stating that the prescription that the pt brought over to them will not work due to [employer] guidelines. We need to fax over 2 separate prescripts. 1 for the walker & 1 for the commode. They also need to have the dx and the dx codes on them as well.   Fax# : [phone]

## 2020-08-23 DIAGNOSIS — R29898 Other symptoms and signs involving the musculoskeletal system: Secondary | ICD-10-CM | POA: Diagnosis not present

## 2020-08-23 DIAGNOSIS — R030 Elevated blood-pressure reading, without diagnosis of hypertension: Secondary | ICD-10-CM | POA: Diagnosis not present

## 2020-08-24 ENCOUNTER — Other Ambulatory Visit (HOSPITAL_COMMUNITY): Payer: Self-pay | Admitting: Neurosurgery

## 2020-08-24 ENCOUNTER — Other Ambulatory Visit: Payer: Self-pay | Admitting: Neurosurgery

## 2020-08-24 DIAGNOSIS — R29898 Other symptoms and signs involving the musculoskeletal system: Secondary | ICD-10-CM

## 2020-08-24 NOTE — Progress Notes (Deleted)
Subjective:   I, Peterson Lombard, LAT, ATC acting as a scribe for Lynne Leader, MD. Chief Complaint: Christina [name],  is a 70 y.o. female who presents for f/u concussion that occurred as a result of a fall she suffered on 08/12/20 when she slipped on some cat litter on the floor and fell backwards, striking the L side of her head and face on the floor.  She was seen at the [facility] ED on 08/12/20 and diagnosed w/ a L maxillary/zygomatic contusion.  She then went to the [facility] ED on 08/13/20 due to "feeling weird" and was diagnosed w/ a possible concussion. Pt was last seen by Dr. Georgina Snell on 08/19/20 and was advised to coordinate home health care services and was referred to neurosurgery. Today, pt reports   Concussion  ***  Injury date : 08/12/20 Visit #: 2   History of Present Illness:    Concussion Self-Reported Symptom Score Symptoms rated on a scale 1-6, in last 24 hours   Headache: ***    Nausea: ***  Dizziness: ***  Vomiting: ***  Balance Difficulty: ***   Trouble Falling Asleep: ***   Fatigue: ***  Sleep Less Than Usual: ***  Daytime Drowsiness: ***  Sleep More Than Usual: ***  Photophobia: ***  Phonophobia: ***  Irritability: ***  Sadness: ***  Numbness or Tingling: ***  Nervousness: ***  Feeling More Emotional: ***  Feeling Mentally Foggy: ***  Feeling Slowed Down: ***  Memory Problems: ***  Difficulty Concentrating: ***  Visual Problems: ***  Total # of Symptoms:  Total Symptom Score: ***  Previous Total # of Symptoms: 13/22 Previous Symptom Score: 56/132  Neck Pain: No Tinnitus: No  Review of Systems:  ***    Review of History: ***  Objective:    Physical Examination There were no vitals filed for this visit. MSK:  *** Neuro: *** Psych: ***     Imaging:  ***  Assessment and Plan   70 y.o. female with ***    ***    Action/Discussion: Reviewed diagnosis, management options, expected outcomes, and the reasons for scheduled  and emergent follow-up. Questions were adequately answered. Patient expressed verbal understanding and agreement with the following plan.     Patient Education:  Reviewed with patient the risks (i.e, a repeat concussion, post-concussion syndrome, second-impact syndrome) of returning to play prior to complete resolution, and thoroughly reviewed the signs and symptoms of concussion.Reviewed need for complete resolution of all symptoms, with rest AND exertion, prior to return to play.  Reviewed red flags for urgent medical evaluation: worsening symptoms, nausea/vomiting, intractable headache, musculoskeletal changes, focal neurological deficits.  [facility] Concussion Care Plan, which clearly outlines the plans stated above, was given to patient.   Level of service: ***      After Visit Summary printed out and provided to patient as appropriate.  The above documentation has been reviewed and is accurate and complete Mare Ferrari

## 2020-08-25 ENCOUNTER — Other Ambulatory Visit: Payer: Self-pay

## 2020-08-25 ENCOUNTER — Ambulatory Visit: Payer: PPO | Admitting: Family Medicine

## 2020-08-25 ENCOUNTER — Ambulatory Visit (HOSPITAL_COMMUNITY)
Admission: RE | Admit: 2020-08-25 | Discharge: 2020-08-25 | Disposition: A | Discharge status: Home / Self Care | Payer: PPO | Source: Ambulatory Visit | Attending: Neurosurgery | Admitting: Neurosurgery

## 2020-08-25 DIAGNOSIS — G319 Degenerative disease of nervous system, unspecified: Secondary | ICD-10-CM | POA: Diagnosis not present

## 2020-08-25 DIAGNOSIS — R29898 Other symptoms and signs involving the musculoskeletal system: Secondary | ICD-10-CM

## 2020-08-25 DIAGNOSIS — S060X0D Concussion without loss of consciousness, subsequent encounter: Secondary | ICD-10-CM | POA: Diagnosis not present

## 2020-08-25 DIAGNOSIS — S76312D Strain of muscle, fascia and tendon of the posterior muscle group at thigh level, left thigh, subsequent encounter: Secondary | ICD-10-CM | POA: Diagnosis not present

## 2020-08-25 DIAGNOSIS — C50412 Malignant neoplasm of upper-outer quadrant of left female breast: Secondary | ICD-10-CM | POA: Diagnosis not present

## 2020-08-25 DIAGNOSIS — M160 Bilateral primary osteoarthritis of hip: Secondary | ICD-10-CM | POA: Diagnosis not present

## 2020-08-25 DIAGNOSIS — G919 Hydrocephalus, unspecified: Secondary | ICD-10-CM | POA: Diagnosis not present

## 2020-08-25 DIAGNOSIS — R296 Repeated falls: Secondary | ICD-10-CM | POA: Diagnosis not present

## 2020-08-25 DIAGNOSIS — M5116 Intervertebral disc disorders with radiculopathy, lumbar region: Secondary | ICD-10-CM | POA: Diagnosis not present

## 2020-08-25 DIAGNOSIS — R590 Localized enlarged lymph nodes: Secondary | ICD-10-CM | POA: Diagnosis not present

## 2020-08-25 DIAGNOSIS — Z9181 History of falling: Secondary | ICD-10-CM | POA: Diagnosis not present

## 2020-08-25 DIAGNOSIS — M4726 Other spondylosis with radiculopathy, lumbar region: Secondary | ICD-10-CM | POA: Diagnosis not present

## 2020-08-25 DIAGNOSIS — M7061 Trochanteric bursitis, right hip: Secondary | ICD-10-CM | POA: Diagnosis not present

## 2020-08-25 DIAGNOSIS — M2578 Osteophyte, vertebrae: Secondary | ICD-10-CM | POA: Diagnosis not present

## 2020-08-25 DIAGNOSIS — M4802 Spinal stenosis, cervical region: Secondary | ICD-10-CM | POA: Diagnosis not present

## 2020-08-25 DIAGNOSIS — M7601 Gluteal tendinitis, right hip: Secondary | ICD-10-CM | POA: Diagnosis not present

## 2020-08-25 DIAGNOSIS — Z87891 Personal history of nicotine dependence: Secondary | ICD-10-CM | POA: Diagnosis not present

## 2020-08-25 DIAGNOSIS — S73191D Other sprain of right hip, subsequent encounter: Secondary | ICD-10-CM | POA: Diagnosis not present

## 2020-08-25 NOTE — Progress Notes (Signed)
Subjective:    Chief Complaint: Christina [name],  is a 70 y.o. female who presents for f/u of a concussion she sustained on 08/12/20 when she slipped on some cat litter and fell backwards, striking the L side of her head and face on the floor.  She was last seen by Dr. Georgina Snell on 08/19/20 and was referred for home health care.  She was referred to neurosurgery and was seen on 08/23/20 by [facility].  An order was also placed for a standard walker and bedside commode.  Since her last visit, pt reports a little bit better. Pt is really bothered by not being able to walk. Pt reports neurosurgery told her surgery was not an option. Neurosurgery did repeat brain and c-spine MRI that were done yesterday. Pt is accompanied at today's visit w/ her sister.  The patient and her sister have contacted a assisted living facility and are trying to get in there which may be a good option.    Home health has been established and is just getting started but social work and other assistance has not yet started.  Dx imaging: 08/25/20 Brain & C-spine MRI  Injury date : 08/12/20 Visit #: 2  History of Present Illness:    Concussion Self-Reported Symptom Score Symptoms rated on a scale 1-6, in last 24 hours   Headache: 0    Nausea: 0  Dizziness: 2  Vomiting: 0  Balance Difficulty: 6   Trouble Falling Asleep: 0   Fatigue: 2  Sleep Less Than Usual: 2  Daytime Drowsiness: 3  Sleep More Than Usual: 0  Photophobia: 0  Phonophobia: 0  Irritability: 4  Sadness: 3  Numbness or Tingling: 0  Nervousness: 2  Feeling More Emotional: 2  Feeling Mentally Foggy: 2  Feeling Slowed Down: 5  Memory Problems: 4  Difficulty Concentrating: 4  Visual Problems: 0   Total # of Symptoms: 13/22 Total Symptom Score: 41/132  Previous Total # of Symptoms: 13/22 Previous Symptom Score: 56/132  Neck Pain: No Tinnitus: No  Review of Systems: No fevers or chills  Review of  History: History of breast cancer  Objective:    Physical Examination Vitals:   08/26/20 0845  BP: (!) 142/80  Pulse: 76  SpO2: 99%   Neuro: Decreased strength in right leg. Alert and oriented.  Normal speech. Seated in wheelchair.     Imaging:  EXAM: MRI CERVICAL SPINE WITHOUT CONTRAST  TECHNIQUE: Multiplanar, multisequence MR imaging of the cervical spine was performed. No intravenous contrast was administered.  COMPARISON:  CT cervical spine August 12, 2020.  FINDINGS: Alignment: Normal alignment.  Vertebrae: Large Schmorl's node involving the superior endplate T1. Mild surrounding bone marrow edema.  Cord: Normal cord signal.  Posterior Fossa, vertebral arteries, paraspinal tissues: Visualized vertebral artery flow voids are maintained. Posterior fossa is better assessed on same day MRI head.  Disc levels:  C2-C3: No significant disc protrusion, foraminal stenosis, or canal stenosis.  C3-C4: Posterior disc osteophyte complex and bilateral facet and uncovertebral hypertrophy. Motion limited evaluation with suspected mild bilateral foraminal stenosis and mild canal stenosis.  C4-C5: Posterior disc osteophyte complex and bilateral facet and uncovertebral hypertrophy. Motion limited evaluation with suspected mild bilateral foraminal stenosis. No significant canal stenosis.  C5-C6: Posterior disc osteophyte complex with left greater than right uncovertebral hypertrophy. Motion limited evaluation with suspected mild left foraminal stenosis and mild canal stenosis.  C6-C7: Posterior disc osteophyte complex and bilateral uncovertebral hypertrophy with mild bilateral foraminal stenosis  and mild canal stenosis.  C7-T1: No significant disc protrusion, foraminal stenosis, or canal stenosis.  IMPRESSION: 1. Motion limited evaluation with suspected mild foraminal stenosis bilaterally at C3-C4 and C4-C5 and on the left at C5-C6. 2. Mild canal  stenosis at C3-C4, C5-C6 and C6-C7. 3. Large Schmorl's node involving the superior T1 endplate. Mild surrounding bone marrow edema, favored to relate to acute inflammation from the Schmorl's node rather than acute fracture given the edema is centered around Schmorl's node, there is no discrete linear fracture line, and no fracture was seen on recent CT of the cervical spine.   Electronically Signed   By: Margaretha Sheffield MD   On: 08/25/2020 16:19  EXAM: MRI HEAD WITHOUT CONTRAST  TECHNIQUE: Multiplanar, multiecho pulse sequences of the brain and surrounding structures were obtained without intravenous contrast.  COMPARISON:  CT head August 13, 2020.  FINDINGS: Brain: No acute infarction, hemorrhage, extra-axial collection or mass lesion. Ventriculomegaly which is out of proportion to the degree of sulcal volume loss with periventricular edema. Mildly acute callosal angle with mild crowding of sulci at the vertex. Predominately periventricular T2/FLAIR hyperintensities. Frontal predominant cerebral atrophy.  Vascular: Major arterial flow voids are maintained at the skull base.  Skull and upper cervical spine: Normal marrow signal.  Sinuses/Orbits: There is sinuses.  Unremarkable orbits.  Other: Trace left mastoid effusion.  IMPRESSION: 1. Ventriculomegaly which is out of proportion to the degree of sulcal volume loss. In the setting of the patient's unsteady gait/falls and confusion/memory issues (per chart review), findings may represent normal pressure hydrocephalus. Central predominant atrophy with ex vacuo ventricular dilation is a differential consideration. 2. Predominantly periventricular T2/FLAIR hyperintensities could represent transependymal flow of CSF or chronic microvascular ischemic disease.  These results will be called to the ordering clinician or representative by the Radiologist Assistant, and communication documented in the PACS or  [employer].   Electronically Signed   By: Margaretha Sheffield MD   On: 08/25/2020 16:03  I, Lynne Leader, personally (independently) visualized and performed the interpretation of the images attached in this note.   Assessment and Plan   70 y.o. female with frequent falls and concussion.  Patient returns today.    She has been seen by neurosurgery.  Notes are not available to me at the time of this visit however per patient report she is not a surgical candidate.  She had a brain and C-spine MRI and has not heard about the results yet.  I can see results which show concern for enlarged ventricle size on brain MRI concern for normal pressure hydrocephalus.  Could explain some of the recent falls.  As for her thinking she is clearing but not fully normal yet.  The biggest issue is that she still is not safe to be at home by herself fully.  Her sister will be leaving to return home next week and [facility] will not have 24-hour assistance next week.  Functionally we will need to find some sort of plan for assisted living or skilled nursing facility starting as early as next week.  Refer to [facility] care management to help with this and advised the family start contacting nursing home and assisted living facilities to help with this.  Additionally home health social work may help.  She effectively does not have a primary care provider at this point so there is not much additional help to be had here beyond that.  I like to see her again next week before her sister leaves.  The backup plan if no other solution is to go to the emergency room.  As for the possible normal pressure hydrocephalus refer to neurology.  I do not know what neurosurgery is doing yet about it.  Await notes from neurosurgery.      Action/Discussion: Reviewed diagnosis, management options, expected outcomes, and the reasons for scheduled and emergent follow-up. Questions were adequately answered. Patient expressed verbal  understanding and agreement with the following plan.     Patient Education:  Reviewed with patient the risks (i.e, a repeat concussion, post-concussion syndrome, second-impact syndrome) of returning to play prior to complete resolution, and thoroughly reviewed the signs and symptoms of concussion.Reviewed need for complete resolution of all symptoms, with rest AND exertion, prior to return to play.  Reviewed red flags for urgent medical evaluation: worsening symptoms, nausea/vomiting, intractable headache, musculoskeletal changes, focal neurological deficits.  [facility] Concussion Care Plan, which clearly outlines the plans stated above, was given to patient.   Level of service: Total encounter time 40 minutes including face-to-face time with the patient and, reviewing past medical record, and charting on the date of service.         After Visit Summary printed out and provided to patient as appropriate.  The above documentation has been reviewed and is accurate and complete Lynne Leader

## 2020-08-26 ENCOUNTER — Ambulatory Visit: Payer: PPO | Admitting: Family Medicine

## 2020-08-26 VITALS — BP 142/80 | HR 76 | Ht 63.0 in

## 2020-08-26 DIAGNOSIS — R9089 Other abnormal findings on diagnostic imaging of central nervous system: Secondary | ICD-10-CM | POA: Diagnosis not present

## 2020-08-26 DIAGNOSIS — S060X0A Concussion without loss of consciousness, initial encounter: Secondary | ICD-10-CM

## 2020-08-26 DIAGNOSIS — R296 Repeated falls: Secondary | ICD-10-CM

## 2020-08-26 NOTE — Patient Instructions (Signed)
Thank you for coming in today.  I have asked for help from case manager from [facility].   I have refereed to neurology.   Recheck next week before [facility] leaves.   Call the local nursing homes ([facility] and see if [facility] can get in.

## 2020-08-27 ENCOUNTER — Encounter: Payer: Self-pay | Admitting: Neurology

## 2020-08-30 ENCOUNTER — Other Ambulatory Visit: Payer: PPO

## 2020-08-30 ENCOUNTER — Telehealth: Payer: Self-pay | Admitting: Family Medicine

## 2020-08-30 DIAGNOSIS — Z87891 Personal history of nicotine dependence: Secondary | ICD-10-CM | POA: Diagnosis not present

## 2020-08-30 DIAGNOSIS — S060X0D Concussion without loss of consciousness, subsequent encounter: Secondary | ICD-10-CM | POA: Diagnosis not present

## 2020-08-30 DIAGNOSIS — Z111 Encounter for screening for respiratory tuberculosis: Secondary | ICD-10-CM

## 2020-08-30 DIAGNOSIS — M4726 Other spondylosis with radiculopathy, lumbar region: Secondary | ICD-10-CM | POA: Diagnosis not present

## 2020-08-30 DIAGNOSIS — M5116 Intervertebral disc disorders with radiculopathy, lumbar region: Secondary | ICD-10-CM | POA: Diagnosis not present

## 2020-08-30 DIAGNOSIS — C50412 Malignant neoplasm of upper-outer quadrant of left female breast: Secondary | ICD-10-CM | POA: Diagnosis not present

## 2020-08-30 DIAGNOSIS — Z9181 History of falling: Secondary | ICD-10-CM | POA: Diagnosis not present

## 2020-08-30 DIAGNOSIS — M7601 Gluteal tendinitis, right hip: Secondary | ICD-10-CM | POA: Diagnosis not present

## 2020-08-30 DIAGNOSIS — M160 Bilateral primary osteoarthritis of hip: Secondary | ICD-10-CM | POA: Diagnosis not present

## 2020-08-30 DIAGNOSIS — S73191D Other sprain of right hip, subsequent encounter: Secondary | ICD-10-CM | POA: Diagnosis not present

## 2020-08-30 DIAGNOSIS — M7061 Trochanteric bursitis, right hip: Secondary | ICD-10-CM | POA: Diagnosis not present

## 2020-08-30 DIAGNOSIS — S76312D Strain of muscle, fascia and tendon of the posterior muscle group at thigh level, left thigh, subsequent encounter: Secondary | ICD-10-CM | POA: Diagnosis not present

## 2020-08-30 NOTE — Telephone Encounter (Signed)
Called [facility] Glad and informed. They are going to come by this afternoon.

## 2020-08-30 NOTE — Progress Notes (Signed)
I, Wendy Poet, LAT, ATC, am serving as scribe for Dr. Lynne Leader.  Christina [name] is a 70 y.o. female who presents to [facility] today for f/u frequent falls and coordination of future care. Pt was previously seen by Dr. Georgina Snell on 08/26/20 for a concussion she sustained on 08/12/20 when she slipped on some cat litter and fell backwards, striking the L side of her head and face on the floor. Pt was referred to [facility] care management to help find a facility and advised pt's family to start contacting nursing homes and assisted living facilities.  Today, pt reports that she's feeling better but is still having issues w/ memory and clarity of thought.  She con't to have pain and weakness in her R leg.  She also states that she has a wound on her L lower leg that she needs looked at.  She is planned to be admitted to [facility] assisted living for respite care starting Thursday, May 5.  She needs a TB test that was drawn yesterday.  Additionally she had a brain MRI that was concerning for normal pressure hydrocephalus and was referred to neurology.  She has an appointment scheduled with Dr. Posey Pronto on July 15.  Dx imaging: 08/25/20 Brain & C-spine MRI  Pertinent review of systems: No fevers or chills  Relevant historical information: History of breast cancer   Exam:  BP 122/74 (BP Location: Right Arm, Patient Position: Sitting, Cuff Size: Normal)   Pulse 74   Ht 5\' 3"  (1.6 m)   Wt 121 lb (54.9 kg)   SpO2 98%   BMI 21.43 kg/m  General: Well Developed, well nourished, and in no acute distress.   Neuro: Alert and oriented normal coordination.  Normal speech and thought process.   Lab and Radiology Results  EXAM: MRI HEAD WITHOUT CONTRAST  TECHNIQUE: Multiplanar, multiecho pulse sequences of the brain and surrounding structures were obtained without intravenous contrast.  COMPARISON:  CT head August 13, 2020.  FINDINGS: Brain: No acute infarction,  hemorrhage, extra-axial collection or mass lesion. Ventriculomegaly which is out of proportion to the degree of sulcal volume loss with periventricular edema. Mildly acute callosal angle with mild crowding of sulci at the vertex. Predominately periventricular T2/FLAIR hyperintensities. Frontal predominant cerebral atrophy.  Vascular: Major arterial flow voids are maintained at the skull base.  Skull and upper cervical spine: Normal marrow signal.  Sinuses/Orbits: There is sinuses.  Unremarkable orbits.  Other: Trace left mastoid effusion.  IMPRESSION: 1. Ventriculomegaly which is out of proportion to the degree of sulcal volume loss. In the setting of the patient's unsteady gait/falls and confusion/memory issues (per chart review), findings may represent normal pressure hydrocephalus. Central predominant atrophy with ex vacuo ventricular dilation is a differential consideration. 2. Predominantly periventricular T2/FLAIR hyperintensities could represent transependymal flow of CSF or chronic microvascular ischemic disease.  These results will be called to the ordering clinician or representative by the Radiologist Assistant, and communication documented in the PACS or [employer].   Electronically Signed   By: Margaretha Sheffield MD   On: 08/25/2020 16:03 I, Lynne Leader, personally (independently) visualized and performed the interpretation of the images attached in this note.     Assessment and Plan: 70 y.o. female with fall and concussion.  Patient is scheduled to be admitted to [facility] assisted living in 2 days for respite care where she will receive physical therapy services.  She will still need continued ongoing medical care.  Recommend that  she schedule a follow-up appoint with her primary care provider and with me.  We will attempt to communicate with her neurologist to see if we can get her evaluation sooner than July 15.  FL-2 form completed and faxed  off to [facility].  The only thing missing from the form is the results of the TB test.  Gold QuantiFERON test ordered and drawn yesterday and still pending.  Check with me in 2 weeks.  Discussed warning signs or symptoms. Please see discharge instructions. Patient expresses understanding.   The above documentation has been reviewed and is accurate and complete Lynne Leader, M.D.  Total encounter time 40 minutes including face-to-face time with the patient and, reviewing past medical record, and charting on the date of service.   Extensive organizational time prior to during and following this visit.

## 2020-08-30 NOTE — Telephone Encounter (Signed)
Labs ordered. Ready to be done ASAP

## 2020-08-30 NOTE — Telephone Encounter (Signed)
Called sister and let her know that TB blood test order is in.  They prefer to come in this afternoon to do TB test.  Also advise Santiago Glad that Dr. Georgina Snell needs a current list of medications and dosages so suggest they ask for a copy of pt's med list up front when they check in so they can go through list and make corrections as needed.

## 2020-08-30 NOTE — Telephone Encounter (Signed)
Patient is going to be going to [facility] for temporary assisted living. To get in, she will need a TB test. They said that they do not care if it is the skin test or blood work. Would Dr Georgina Snell be willing to order the lab for her to have done while she is here for her appointment tomorrow?  Please advise.  Sister: Santiago Glad [phone]

## 2020-08-31 ENCOUNTER — Other Ambulatory Visit: Payer: Self-pay

## 2020-08-31 ENCOUNTER — Encounter: Payer: PPO | Admitting: Neurology

## 2020-08-31 ENCOUNTER — Ambulatory Visit: Payer: PPO | Admitting: Family Medicine

## 2020-08-31 ENCOUNTER — Encounter: Payer: Self-pay | Admitting: Family Medicine

## 2020-08-31 VITALS — BP 122/74 | HR 74 | Ht 63.0 in | Wt 121.0 lb

## 2020-08-31 DIAGNOSIS — S060X0D Concussion without loss of consciousness, subsequent encounter: Secondary | ICD-10-CM | POA: Diagnosis not present

## 2020-08-31 DIAGNOSIS — Z111 Encounter for screening for respiratory tuberculosis: Secondary | ICD-10-CM

## 2020-08-31 DIAGNOSIS — R9089 Other abnormal findings on diagnostic imaging of central nervous system: Secondary | ICD-10-CM

## 2020-08-31 DIAGNOSIS — R296 Repeated falls: Secondary | ICD-10-CM

## 2020-08-31 NOTE — Patient Instructions (Addendum)
Thank you for coming in today.  Plan for recheck with me in 2 weeks  Schedule a follow up appointment with me and either someone upstairs or Jodi Mourning.   I will ask Neuro if they want me to do anything sooner.

## 2020-09-01 LAB — QUANTIFERON-TB GOLD PLUS
Mitogen-NIL: >10 IU/mL
NIL: 0.04 IU/mL
QuantiFERON-TB Gold Plus: NEGATIVE
TB1-NIL: 0.01 IU/mL
TB2-NIL: 0 IU/mL

## 2020-09-01 NOTE — Progress Notes (Signed)
TB test is negative.  We will fax a copy to [facility]

## 2020-09-03 ENCOUNTER — Telehealth: Payer: Self-pay | Admitting: Family Medicine

## 2020-09-03 NOTE — Telephone Encounter (Signed)
Recd request from [facility], patient needs refill of Tramadol sent to [facility] in [place]. She only has 2 left per note.

## 2020-09-06 MED ORDER — TRAMADOL HCL 50 MG PO TABS: 50.0000 mg | ORAL_TABLET | Freq: Four times a day (QID) | ORAL | 0 refills | Status: DC | PRN

## 2020-09-06 NOTE — Telephone Encounter (Signed)
Done

## 2020-09-07 ENCOUNTER — Ambulatory Visit: Payer: PPO | Admitting: Family Medicine

## 2020-09-07 DIAGNOSIS — Z9181 History of falling: Secondary | ICD-10-CM | POA: Diagnosis not present

## 2020-09-07 DIAGNOSIS — M7061 Trochanteric bursitis, right hip: Secondary | ICD-10-CM | POA: Diagnosis not present

## 2020-09-07 DIAGNOSIS — M4726 Other spondylosis with radiculopathy, lumbar region: Secondary | ICD-10-CM | POA: Diagnosis not present

## 2020-09-07 DIAGNOSIS — Z87891 Personal history of nicotine dependence: Secondary | ICD-10-CM | POA: Diagnosis not present

## 2020-09-07 DIAGNOSIS — M7601 Gluteal tendinitis, right hip: Secondary | ICD-10-CM | POA: Diagnosis not present

## 2020-09-07 DIAGNOSIS — M160 Bilateral primary osteoarthritis of hip: Secondary | ICD-10-CM | POA: Diagnosis not present

## 2020-09-07 DIAGNOSIS — C50412 Malignant neoplasm of upper-outer quadrant of left female breast: Secondary | ICD-10-CM | POA: Diagnosis not present

## 2020-09-07 DIAGNOSIS — S76312D Strain of muscle, fascia and tendon of the posterior muscle group at thigh level, left thigh, subsequent encounter: Secondary | ICD-10-CM | POA: Diagnosis not present

## 2020-09-07 DIAGNOSIS — S060X0D Concussion without loss of consciousness, subsequent encounter: Secondary | ICD-10-CM | POA: Diagnosis not present

## 2020-09-07 DIAGNOSIS — S73191D Other sprain of right hip, subsequent encounter: Secondary | ICD-10-CM | POA: Diagnosis not present

## 2020-09-07 DIAGNOSIS — M5116 Intervertebral disc disorders with radiculopathy, lumbar region: Secondary | ICD-10-CM | POA: Diagnosis not present

## 2020-09-09 ENCOUNTER — Telehealth: Payer: Self-pay | Admitting: Family Medicine

## 2020-09-09 NOTE — Telephone Encounter (Signed)
Patient's sister called asking if Dr Georgina Snell would be able to call her. She had some follow up questions regarding Christina [name]'s concussion.  Please advise.

## 2020-09-09 NOTE — Telephone Encounter (Signed)
I called Christina [name] back. She is improving but taking a while.  She has an appointment with Dr Posey Pronto neurology on the 19th (sooner than originally scheduled).   Discussed concussions.

## 2020-09-14 ENCOUNTER — Ambulatory Visit: Payer: PPO | Admitting: Family Medicine

## 2020-09-15 NOTE — Progress Notes (Signed)
[facility] Clinic Note - Initial Visit   Date: 09/16/20  ALEXIUS ELLINGTON MRN: [id number] DOB: March 11, 1951   Dear Dr. Georgina Snell:  Thank you for your kind referral of [name] for consultation of abnormal MRI. Although her history is well known to you, please allow [place] to reiterate it for the purpose of our medical record. The patient was accompanied to the clinic by friend who also provides collateral information.     History of Present Illness: Ameliarose Shark Pincock is a 70 y.o. right-handed female with diabetes mellitus, left breast cancer s/p lumpectomy, hypothyroidism presenting for evaluation of gait change and memory loss.  Patient reports having gait change since 2019 when she suffered injury to her right hip after jumping and landing on it wrong.  She had it evaluated and was found to have degenerative changes.  Since this time, her walking has been impaired and she has been dragging her leg.  She completed PT which helped.  In April 2022, she slipped on cat litter and fell, landing on her left side.  She suffered facial soft tissue injury and say that her face was completely bruised.  Her friend came and picked her up and noticed that she was confused.  She was taken to the ER by her friend where they performed CT head, face, and cervical spine.   CT showed mild-moderate cerebral atrophy with ex vacuo dilation of the ventricular system as well as white matter changes.  Subsequent MRI brain also demonstrated the same findings.  Due to concern of NPH, patient was referred here.   Prior to her fall, patient did not have any memory issues.  She was living independently, driving, managing her own finances/medications, and ADLs.  Since her fall, she reports cognitive slowing and it takes more effort to remember details, like her passwords.  Over the past few weeks, this has improved.  She is currently at [facility] for rehab.  She is walking with a walker with PT  and uses a wheelchair other times because of imbalance.    Out-side paper records, electronic medical record, and images have been reviewed where available and summarized as:   Lab Results  Component Value Date   TSH 0.02 (L) 05/19/2020    MRI cervical spine 08/25/2020: 1. Motion limited evaluation with suspected mild foraminal stenosis bilaterally at C3-C4 and C4-C5 and on the left at C5-C6. 2. Mild canal stenosis at C3-C4, C5-C6 and C6-C7. 3. Large Schmorl's node involving the superior T1 endplate. Mild surrounding bone marrow edema, favored to relate to acute inflammation from the Schmorl's node rather than acute fracture given the edema is centered around Schmorl's node, there is no discrete linear fracture line, and no fracture was seen on recent CT of the cervical spine.  MRI brain wo contrast 08/25/2020: 1. Ventriculomegaly which is out of proportion to the degree ofsulcal volume loss. In the setting of the patient's unsteady gait/falls and confusion/memory issues (per chart review), findings may represent normal pressure hydrocephalus. Central predominant atrophy with ex vacuo ventricular dilation is a differential consideration. 2. Predominantly periventricular T2/FLAIR hyperintensities could represent transependymal flow of CSF or chronic microvascular ischemic disease.  NCS/EMG of the right leg 07/28/2020:  Normal right sural and superficial peroneal nerves.  This is an incomplete study, as testing was terminated at patient's request due to pain.  Lab Results  Component Value Date   HGBA1C 6.3 03/01/2020   No results found for: [id number] Lab Results  Component Value Date  TSH 0.02 (L) 05/19/2020   No results found for: ESRSEDRATE, POCTSEDRATE  Past Medical History:  Diagnosis Date  . Abrasion of right leg 12/17/2014  . Adrenal insufficiency ([place])   . Breast cancer of upper-outer quadrant of left female breast ([place]) 12/07/2014  . Cataract, immature    bilateral  . Dental  bridge present    upper - x 2  . Dental crowns present   . Hypothyroidism   . Personal history of radiation therapy   . Prediabetes     Past Surgical History:  Procedure Laterality Date  . ABDOMINOPLASTY    . BALLOON SINUPLASTY    . BREAST LUMPECTOMY Left 2016  . BREAST LUMPECTOMY WITH RADIOACTIVE SEED AND SENTINEL LYMPH NODE BIOPSY Left 12/22/2014   Procedure: BREAST LUMPECTOMY WITH RADIOACTIVE SEED AND SENTINEL LYMPH NODE BIOPSY;  Surgeon: Stark Klein, MD;  Location: [place];  Service: General;  Laterality: Left;  . BUNIONECTOMY Right   . HAMMER TOE SURGERY Bilateral   . RHINOPLASTY    . SEPTOPLASTY       Medications:  Outpatient Encounter Medications as of 09/16/2020  Medication Sig  . albuterol (PROVENTIL HFA;VENTOLIN HFA) 108 (90 BASE) MCG/ACT inhaler Inhale into the lungs every 6 (six) hours as needed for wheezing or shortness of breath.  . cetirizine (ZYRTEC) 10 MG tablet Take 10 mg by mouth at bedtime.  . clonazePAM (KLONOPIN) 1 MG tablet Take 1 tablet (1 mg total) by mouth at bedtime.  . fluticasone (FLONASE) 50 MCG/ACT nasal spray Place 1 spray into both nostrils at bedtime.  Marland Kitchen ibuprofen (ADVIL,MOTRIN) 200 MG tablet Take 600 mg by mouth every 4 (four) hours as needed for fever, headache, mild pain, moderate pain or cramping.  Marland Kitchen liothyronine (CYTOMEL) 25 MCG tablet Take 50 mcg by mouth daily.  . metFORMIN (GLUCOPHAGE-XR) 500 MG 24 hr tablet Take 500 mg by mouth daily.  . montelukast (SINGULAIR) 10 MG tablet Take 1 tablet (10 mg total) by mouth at bedtime.  . traMADol (ULTRAM) 50 MG tablet Take 1 tablet (50 mg total) by mouth every 6 (six) hours as needed for moderate pain.  . cycloSPORINE (RESTASIS) 0.05 % ophthalmic emulsion Place 1 drop into both eyes 2 (two) times daily. (Patient not taking: No sig reported)  . melatonin 5 MG TABS Take 5 mg by mouth at bedtime as needed for sleep. f (Patient not taking: Reported on 09/16/2020)   No  facility-administered encounter medications on file as of 09/16/2020.    Allergies:  Allergies  Allergen Reactions  . Other Other (See Comments)    Tea Throat swelling  . Celery Oil Other (See Comments)    FEVER BLISTERS  . Doxycycline Nausea And Vomiting  . Rye Grass Flower Pollen Extract [Gramineae Pollens] Swelling    RYE:  SWELLING THROAT - DENIES SOB  . Sulfa Antibiotics Swelling    NOSE  . Tea Swelling    THROAT - DENIES SOB  . Adhesive [Tape] Other (See Comments)    TEARS SKIN    Family History: Family History  Problem Relation Age of Onset  . Diabetes Mother   . Diabetes Father   . Cancer Maternal Uncle        pancreatic cancer   . Diabetes Paternal Grandmother   . Diabetes Paternal Grandfather   . Breast cancer Sister     Social History: Social History   Tobacco Use  . Smoking status: Former Smoker    Packs/day: 0.00    Years: 0.00  Pack years: 0.00    Quit date: 05/01/1980    Years since quitting: 40.4  . Smokeless tobacco: Never Used  Vaping Use  . Vaping Use: Never used  Substance Use Topics  . Alcohol use: Yes    Alcohol/week: 0.0 standard drinks    Comment: occasionally  . Drug use: No   Social History   Social History Narrative   Right handed    Vital Signs:  BP (!) 172/88   Pulse 98   Ht 5\' 3"  (1.6 m)   Wt 120 lb (54.4 kg)   SpO2 98%   BMI 21.26 kg/m   Neurological Exam: MENTAL STATUS including orientation to time, place, person, recent and remote memory, attention span and concentration, language, and fund of knowledge is normal. Mild cognitive slowing, questions are repeated and it takes a moment for her to process. She is able to follow commands.  Speech is not dysarthric.  CRANIAL NERVES: II:  No visual field defects. III-IV-VI: Pupils equal round and reactive to light.  Normal conjugate, extra-ocular eye movements in all directions of gaze.  No nystagmus.  No ptosis.   V:  Normal facial sensation.    VII:  Normal facial  symmetry and movements.   VIII:  Normal hearing and vestibular function.   IX-X:  Normal palatal movement.   XI:  Normal shoulder shrug and head rotation.   XII:  Normal tongue strength and range of motion, no deviation or fasciculation.  MOTOR:  No atrophy, fasciculations or abnormal movements.  No pronator drift.   Upper Extremity:  Right  Left  Deltoid  5/5   5/5   Biceps  5/5   5/5   Triceps  5/5   5/5   Infraspinatus 5/5  5/5  Medial pectoralis 5/5  5/5  Wrist extensors  5/5   5/5   Wrist flexors  5/5   5/5   Finger extensors  5/5   5/5   Finger flexors  5/5   5/5   Dorsal interossei  5/5   5/5   Abductor pollicis  5/5   5/5   Tone (Ashworth scale)  0  0   Lower Extremity:  Right  Left  Hip flexors  5/5   5/5   Hip extensors  5/5   5/5   Adductor 5/5  5/5  Abductor 5/5  5/5  Knee flexors  5/5   5/5   Knee extensors  5/5   5/5   Dorsiflexors  5/5   5/5   Plantarflexors  5/5   5/5   Toe extensors  5/5   5/5   Toe flexors  5/5   5/5   Tone (Ashworth scale)  0  0   MSRs:  Right        Left                  brachioradialis 2+  2+  biceps 2+  2+  triceps 2+  2+  patellar 2+  2+  ankle jerk 2+  2+  Hoffman no  no  plantar response down  down   SENSORY:  Normal and symmetric perception of light touch, pinprick, vibration  COORDINATION/GAIT: Normal finger-to- nose-finger.  Intact rapid alternating movements bilaterally.  Able to rise from a chair without using arms.  She arrived in wheelchair, but given her normal motor strength, I attempted to see if she could walk a few steps unassisted, which she was able to do.  Gait appeared mildly wide-based,  slow and cautious, no ataxia.   IMPRESSION: Memory change due to concussion.  Prior to her fall, she did not have any baseline cognitive deficits and able to perform all IADLs and ADLs. Fortunately, her memory changes are slowly improving.  Formal neurocognitive testing was declined.  Her labs from early 2022 show severely  suppressed TSH suggesting that she may be overmedication with thyroid medication.  I recommend that she talk to her PCP to evaluate her thyroid and make medication changes as needed.  Gait abnormality. She was able to walk unassisted about 3-4 steps to the exam table and back to her wheelchair.  Gait was cautious, mildly wide-based, no ataxia.  Her gait was not typical for NPH. I did not appreciate any weakness in the legs and there is no evidence of neuropathy.  Prior imaging of the lumbar spine shows degenerative changes with nerve impingement at the right L3-4 level and left L4-5 level, fortunately, she denies radicular symptoms.  If she developed right proximal weakness in the future, it would be important to consider L3 impingement.  There is no central canal stenosis to explain gait change.     Ventriculomegaly seems to be moreso consistent with ex vacuo, than NPH.  Imaging was personally viewed with patient which shows sulcal volume loss and I think this is more cerebral atrophy.  I discussed that the best way to determine NPH is with large volume LP with PT assessment.  Patient wishes to think about whether to pursue testing. My overall suspicion for NPH is low as this is more insidious in onset and she has relatively abrupt clinical change in memory and gait following her fall.  Return to clinic as needed  Total time spent reviewing records, interview, history/exam, documentation, and coordination of care on day of encounter:  50 min   Thank you for allowing me to participate in patient's care.  If I can answer any additional questions, I would be pleased to do so.    Sincerely,    Maicie Vanderloop K. Posey Pronto, DO

## 2020-09-16 ENCOUNTER — Other Ambulatory Visit: Payer: Self-pay

## 2020-09-16 ENCOUNTER — Ambulatory Visit: Payer: PPO | Admitting: Neurology

## 2020-09-16 ENCOUNTER — Encounter: Payer: Self-pay | Admitting: Neurology

## 2020-09-16 VITALS — BP 172/88 | HR 98 | Ht 63.0 in | Wt 120.0 lb

## 2020-09-16 DIAGNOSIS — G9389 Other specified disorders of brain: Secondary | ICD-10-CM

## 2020-09-16 DIAGNOSIS — S060X0A Concussion without loss of consciousness, initial encounter: Secondary | ICD-10-CM

## 2020-09-16 NOTE — Patient Instructions (Addendum)
Please contact my office if you would like to proceed with spinal tap and memory testing.  Follow-up with your primary care doctor to check your thyroid

## 2020-09-23 DIAGNOSIS — M7061 Trochanteric bursitis, right hip: Secondary | ICD-10-CM | POA: Diagnosis not present

## 2020-09-23 DIAGNOSIS — M5116 Intervertebral disc disorders with radiculopathy, lumbar region: Secondary | ICD-10-CM | POA: Diagnosis not present

## 2020-09-23 DIAGNOSIS — Z9181 History of falling: Secondary | ICD-10-CM | POA: Diagnosis not present

## 2020-09-23 DIAGNOSIS — C50412 Malignant neoplasm of upper-outer quadrant of left female breast: Secondary | ICD-10-CM | POA: Diagnosis not present

## 2020-09-23 DIAGNOSIS — M7601 Gluteal tendinitis, right hip: Secondary | ICD-10-CM | POA: Diagnosis not present

## 2020-09-23 DIAGNOSIS — M160 Bilateral primary osteoarthritis of hip: Secondary | ICD-10-CM | POA: Diagnosis not present

## 2020-09-23 DIAGNOSIS — S76312D Strain of muscle, fascia and tendon of the posterior muscle group at thigh level, left thigh, subsequent encounter: Secondary | ICD-10-CM | POA: Diagnosis not present

## 2020-09-23 DIAGNOSIS — Z87891 Personal history of nicotine dependence: Secondary | ICD-10-CM | POA: Diagnosis not present

## 2020-09-23 DIAGNOSIS — S060X0D Concussion without loss of consciousness, subsequent encounter: Secondary | ICD-10-CM | POA: Diagnosis not present

## 2020-09-23 DIAGNOSIS — M4726 Other spondylosis with radiculopathy, lumbar region: Secondary | ICD-10-CM | POA: Diagnosis not present

## 2020-09-23 DIAGNOSIS — S73191D Other sprain of right hip, subsequent encounter: Secondary | ICD-10-CM | POA: Diagnosis not present

## 2020-09-29 DIAGNOSIS — M5116 Intervertebral disc disorders with radiculopathy, lumbar region: Secondary | ICD-10-CM | POA: Diagnosis not present

## 2020-09-29 DIAGNOSIS — S060X0D Concussion without loss of consciousness, subsequent encounter: Secondary | ICD-10-CM | POA: Diagnosis not present

## 2020-09-29 DIAGNOSIS — Z9181 History of falling: Secondary | ICD-10-CM | POA: Diagnosis not present

## 2020-09-29 DIAGNOSIS — M4726 Other spondylosis with radiculopathy, lumbar region: Secondary | ICD-10-CM | POA: Diagnosis not present

## 2020-09-29 DIAGNOSIS — M160 Bilateral primary osteoarthritis of hip: Secondary | ICD-10-CM | POA: Diagnosis not present

## 2020-09-29 DIAGNOSIS — M7601 Gluteal tendinitis, right hip: Secondary | ICD-10-CM | POA: Diagnosis not present

## 2020-09-29 DIAGNOSIS — S73191D Other sprain of right hip, subsequent encounter: Secondary | ICD-10-CM | POA: Diagnosis not present

## 2020-09-29 DIAGNOSIS — S76312D Strain of muscle, fascia and tendon of the posterior muscle group at thigh level, left thigh, subsequent encounter: Secondary | ICD-10-CM | POA: Diagnosis not present

## 2020-09-29 DIAGNOSIS — M7061 Trochanteric bursitis, right hip: Secondary | ICD-10-CM | POA: Diagnosis not present

## 2020-09-29 DIAGNOSIS — Z87891 Personal history of nicotine dependence: Secondary | ICD-10-CM | POA: Diagnosis not present

## 2020-09-29 DIAGNOSIS — C50412 Malignant neoplasm of upper-outer quadrant of left female breast: Secondary | ICD-10-CM | POA: Diagnosis not present

## 2020-10-05 DIAGNOSIS — M160 Bilateral primary osteoarthritis of hip: Secondary | ICD-10-CM | POA: Diagnosis not present

## 2020-10-05 DIAGNOSIS — M7601 Gluteal tendinitis, right hip: Secondary | ICD-10-CM | POA: Diagnosis not present

## 2020-10-05 DIAGNOSIS — M4726 Other spondylosis with radiculopathy, lumbar region: Secondary | ICD-10-CM | POA: Diagnosis not present

## 2020-10-05 DIAGNOSIS — M7061 Trochanteric bursitis, right hip: Secondary | ICD-10-CM | POA: Diagnosis not present

## 2020-10-05 DIAGNOSIS — S73191D Other sprain of right hip, subsequent encounter: Secondary | ICD-10-CM | POA: Diagnosis not present

## 2020-10-05 DIAGNOSIS — S76312D Strain of muscle, fascia and tendon of the posterior muscle group at thigh level, left thigh, subsequent encounter: Secondary | ICD-10-CM | POA: Diagnosis not present

## 2020-10-05 DIAGNOSIS — Z87891 Personal history of nicotine dependence: Secondary | ICD-10-CM | POA: Diagnosis not present

## 2020-10-05 DIAGNOSIS — Z9181 History of falling: Secondary | ICD-10-CM | POA: Diagnosis not present

## 2020-10-05 DIAGNOSIS — M5116 Intervertebral disc disorders with radiculopathy, lumbar region: Secondary | ICD-10-CM | POA: Diagnosis not present

## 2020-10-05 DIAGNOSIS — C50412 Malignant neoplasm of upper-outer quadrant of left female breast: Secondary | ICD-10-CM | POA: Diagnosis not present

## 2020-10-05 DIAGNOSIS — S060X0D Concussion without loss of consciousness, subsequent encounter: Secondary | ICD-10-CM | POA: Diagnosis not present

## 2020-10-21 DIAGNOSIS — Z9181 History of falling: Secondary | ICD-10-CM | POA: Diagnosis not present

## 2020-10-21 DIAGNOSIS — S73191D Other sprain of right hip, subsequent encounter: Secondary | ICD-10-CM | POA: Diagnosis not present

## 2020-10-21 DIAGNOSIS — M5116 Intervertebral disc disorders with radiculopathy, lumbar region: Secondary | ICD-10-CM | POA: Diagnosis not present

## 2020-10-21 DIAGNOSIS — S060X0D Concussion without loss of consciousness, subsequent encounter: Secondary | ICD-10-CM | POA: Diagnosis not present

## 2020-10-21 DIAGNOSIS — Z87891 Personal history of nicotine dependence: Secondary | ICD-10-CM | POA: Diagnosis not present

## 2020-10-21 DIAGNOSIS — C50412 Malignant neoplasm of upper-outer quadrant of left female breast: Secondary | ICD-10-CM | POA: Diagnosis not present

## 2020-10-21 DIAGNOSIS — M7061 Trochanteric bursitis, right hip: Secondary | ICD-10-CM | POA: Diagnosis not present

## 2020-10-21 DIAGNOSIS — M4726 Other spondylosis with radiculopathy, lumbar region: Secondary | ICD-10-CM | POA: Diagnosis not present

## 2020-10-21 DIAGNOSIS — M7601 Gluteal tendinitis, right hip: Secondary | ICD-10-CM | POA: Diagnosis not present

## 2020-10-21 DIAGNOSIS — S76312D Strain of muscle, fascia and tendon of the posterior muscle group at thigh level, left thigh, subsequent encounter: Secondary | ICD-10-CM | POA: Diagnosis not present

## 2020-10-21 DIAGNOSIS — M160 Bilateral primary osteoarthritis of hip: Secondary | ICD-10-CM | POA: Diagnosis not present

## 2020-10-27 DIAGNOSIS — M7601 Gluteal tendinitis, right hip: Secondary | ICD-10-CM | POA: Diagnosis not present

## 2020-10-27 DIAGNOSIS — Z9181 History of falling: Secondary | ICD-10-CM | POA: Diagnosis not present

## 2020-10-27 DIAGNOSIS — M7061 Trochanteric bursitis, right hip: Secondary | ICD-10-CM | POA: Diagnosis not present

## 2020-10-27 DIAGNOSIS — S80221D Blister (nonthermal), right knee, subsequent encounter: Secondary | ICD-10-CM | POA: Diagnosis not present

## 2020-10-27 DIAGNOSIS — S060X0D Concussion without loss of consciousness, subsequent encounter: Secondary | ICD-10-CM | POA: Diagnosis not present

## 2020-10-27 DIAGNOSIS — M4726 Other spondylosis with radiculopathy, lumbar region: Secondary | ICD-10-CM | POA: Diagnosis not present

## 2020-10-27 DIAGNOSIS — M160 Bilateral primary osteoarthritis of hip: Secondary | ICD-10-CM | POA: Diagnosis not present

## 2020-10-27 DIAGNOSIS — C50412 Malignant neoplasm of upper-outer quadrant of left female breast: Secondary | ICD-10-CM | POA: Diagnosis not present

## 2020-10-27 DIAGNOSIS — M5116 Intervertebral disc disorders with radiculopathy, lumbar region: Secondary | ICD-10-CM | POA: Diagnosis not present

## 2020-10-27 DIAGNOSIS — Z87891 Personal history of nicotine dependence: Secondary | ICD-10-CM | POA: Diagnosis not present

## 2020-10-29 DIAGNOSIS — C50412 Malignant neoplasm of upper-outer quadrant of left female breast: Secondary | ICD-10-CM | POA: Diagnosis not present

## 2020-10-29 DIAGNOSIS — S060X0D Concussion without loss of consciousness, subsequent encounter: Secondary | ICD-10-CM | POA: Diagnosis not present

## 2020-10-29 DIAGNOSIS — Z9181 History of falling: Secondary | ICD-10-CM | POA: Diagnosis not present

## 2020-10-29 DIAGNOSIS — M7601 Gluteal tendinitis, right hip: Secondary | ICD-10-CM | POA: Diagnosis not present

## 2020-10-29 DIAGNOSIS — M4726 Other spondylosis with radiculopathy, lumbar region: Secondary | ICD-10-CM | POA: Diagnosis not present

## 2020-10-29 DIAGNOSIS — S80221D Blister (nonthermal), right knee, subsequent encounter: Secondary | ICD-10-CM | POA: Diagnosis not present

## 2020-10-29 DIAGNOSIS — M160 Bilateral primary osteoarthritis of hip: Secondary | ICD-10-CM | POA: Diagnosis not present

## 2020-10-29 DIAGNOSIS — M7061 Trochanteric bursitis, right hip: Secondary | ICD-10-CM | POA: Diagnosis not present

## 2020-10-29 DIAGNOSIS — Z87891 Personal history of nicotine dependence: Secondary | ICD-10-CM | POA: Diagnosis not present

## 2020-10-29 DIAGNOSIS — M5116 Intervertebral disc disorders with radiculopathy, lumbar region: Secondary | ICD-10-CM | POA: Diagnosis not present

## 2020-11-08 NOTE — Progress Notes (Signed)
I, Wendy Poet, LAT, ATC, am serving as scribe for Dr. Lynne Leader.  Christina [name] is a 70 y.o. female who presents to [place] at [facility] today for f/u of multiple issues including frequent falls, concussion and con't pain and weakness in her R leg/thigh.  She was referred to and admitted to [facility] on 09/02/20 and would like to discuss the possibility of future d/c to home.  She was also seen by Dr. Posey Pronto w/ [facility] on 09/16/20.  Today, pt reports she is doing better overall. She is ambulating w/ a walker and working on her gait. Pt reports she came home yesterday. Pt is interested in discussing PT and would like referral.  Additionally she is interested in a repeat lumbar epidural steroid injection.  Last ESIs were November and December 2021.  Diagnostic testing: C-spine and brain MRI- 08/25/20; CT head and c-spine w/o contrast- 08/13/20; L-spine MRI- 03/06/20; L-spine XR- 02/25/20  Pertinent review of systems: No fevers or chills  Relevant historical information: Leg weakness and falls with recent concussion.   Exam:  BP (!) 146/88 (BP Location: Right Arm, Patient Position: Sitting, Cuff Size: Normal)   Pulse 97   SpO2 99%  General: Well Developed, well nourished, and in no acute distress.   MSK: Lower extremity strength slight decreased strength right hip flexion and knee extension 4/5 otherwise intact. Reflexes are intact. Ambulates using a walker.  Neuro: Alert and oriented normal coordination speech thought process.    Lab and Radiology Results  EXAM: MRI LUMBAR SPINE WITHOUT CONTRAST   TECHNIQUE: Multiplanar, multisequence MR imaging of the lumbar spine was performed. No intravenous contrast was administered.   COMPARISON:  Lumbar spine radiographs 02/25/2020. Lumbar spine MRI 11/02/2016.   FINDINGS: Segmentation: For the purposes of this dictation, five lumbar vertebrae are assumed and the caudal most well-formed  intervertebral disc is designated L5-S1. Spinal numbering will remain consistent with that utilized on the prior MRI of 11/02/2016.   Alignment: Straightening of the expected lumbar lordosis. No significant spondylolisthesis.   Vertebrae: Vertebral body height is maintained. No significant marrow edema or focal suspicious osseous lesion.   Conus medullaris and cauda equina: Conus extends to the T12-L1 level. No signal abnormality within the visualized distal spinal cord.   Paraspinal and other soft tissues: No abnormality identified within included portions of the abdomen/retroperitoneum. Paraspinal soft tissues within normal limits.   Disc levels:   Unless otherwise stated, the level by level findings below have not significantly changed since prior MRI 11/02/2016.   Congenitally narrow lumbar spinal canal on the basis of short pedicles.   Multilevel disc degeneration. Most notably, there is mild/moderate disc degeneration at L2-L3 and L3-L4.   T12-L1: No significant disc herniation or stenosis.   L1-L2: A previously described 4 mm T2 hyperintense lesion within the right extraforaminal region is no longer definitively appreciated. Minimal disc bulge. Mild facet arthrosis. No significant spinal canal or foraminal narrowing.   L2-L3: Disc bulge with endplate spurring. Superimposed small right foraminal disc protrusion. Mild facet arthrosis/ligamentum flavum hypertrophy. Mild/moderate bilateral subarticular narrowing without frank nerve root impingement. Mild relative narrowing of the central canal. Mild right neural foraminal narrowing.   L3-L4: Disc bulge. Superimposed right foraminal/extraforaminal cranially migrated disc extrusion. Mild facet arthrosis/ligamentum flavum hypertrophy. Mild/moderate bilateral subarticular stenosis without frank nerve root impingement. Mild relative narrowing of the central canal. As before, the disc extrusion contributes to severe right  neural foraminal narrowing with encroachment upon the exiting right L3  nerve root. Mild/moderate left neural foraminal narrowing.   L4-L5: Disc bulge with mild endplate spurring. Mild facet arthrosis. Mild bilateral subarticular narrowing without nerve root impingement. Minimal relative narrowing of the central canal. Mild right neural foraminal narrowing. Moderate to moderately severe left neural foraminal narrowing, slightly progressed.   L5-S1: Minimal facet arthrosis. No significant disc herniation or stenosis.   IMPRESSION: Lumbar spondylosis as outlined with findings most notably as follows. Apart from slight progression of moderate to moderately severe left L4-L5 neural foraminal narrowing, findings have not significantly changed since the MRI of 11/02/2016   At L3-L4, there is mild/moderate disc degeneration. Unchanged right foraminal/extraforaminal cranially migrated disc extrusion contributing to severe right neural foraminal narrowing, approaching upon the exiting right L3 nerve root. Multifactorial mild/moderate bilateral subarticular narrowing without frank nerve root impingement. Mild/moderate left neural foraminal narrowing.   At L2-L3, there is mild/moderate disc degeneration. Multifactorial mild/moderate bilateral subarticular narrowing without frank nerve root impingement. Mild right neural foraminal narrowing.   At L4-L5, there is multifactorial moderate to moderately severe left neural foraminal narrowing, slightly progressed. Multifactorial mild spinal canal and right neural foraminal narrowing.     Electronically Signed   By: Kellie Simmering DO   On: 03/07/2020 15:49 I, Lynne Leader, personally (independently) visualized and performed the interpretation of the images attached in this note.     Assessment and Plan: 70 y.o. female with resolved concussion.  Patient is home now from [place] assisted living after suffering a bad concussion with significant  impaired gait and memory changes.  She received extensive physical therapy services.  Her concussion has dramatically improved and she is back to her baseline mentally.  She still has difficulty with gait and leg weakness which has been a recurrent ongoing long-term issue for her.  However now she has much better ambulation with a walker.  Plan to transition to outpatient physical therapy to continue physical therapy services.  Continue the use of walker.  Repeat epidural steroid injection.  Of note patient did see neurology in May prior to [facility] where she was thought not to have normal pressure hydrocephalus.  Please see Dr. Serita Grit note dated May 19  Recheck back with me in 1 month.  She has an establish care visit with Dr. Sharlet Salina scheduled for the end of September to establish care for primary care services.   PDMP not reviewed this encounter. Orders Placed This Encounter  Procedures   DG INJECT DIAG/THERA/INC NEEDLE/CATH/PLC EPI/LUMB/SAC W/IMG    LUMB EPI #1 RT L3 HTA 120 LBS PACS (03/06/20) NO THINS/OTC    Standing Status:   Future    Standing Expiration Date:   11/10/2021    Order Specific Question:   Reason for Exam (SYMPTOM  OR DIAGNOSIS REQUIRED)    Answer:   Rt L3 symptoms. Level and technique per radiology    Order Specific Question:   Preferred Imaging Location?    Answer:   [id number]    Order Specific Question:   Radiology Contrast Protocol - do NOT remove file path    Answer:   [email or web address]   Ambulatory referral to Physical Therapy    Referral Priority:   Routine    Referral Type:   Physical Medicine    Referral Reason:   Specialty Services Required    Requested Specialty:   Physical Therapy    Number of Visits Requested:   1   No orders of the defined types were placed in this encounter.    Discussed warning  signs or symptoms. Please see discharge instructions. Patient expresses understanding.   The above  documentation has been reviewed and is accurate and complete Lynne Leader, M.D.

## 2020-11-10 ENCOUNTER — Ambulatory Visit: Payer: PPO | Admitting: Family Medicine

## 2020-11-10 ENCOUNTER — Other Ambulatory Visit: Payer: Self-pay

## 2020-11-10 VITALS — BP 146/88 | HR 97

## 2020-11-10 DIAGNOSIS — R269 Unspecified abnormalities of gait and mobility: Secondary | ICD-10-CM | POA: Diagnosis not present

## 2020-11-10 DIAGNOSIS — H0100A Unspecified blepharitis right eye, upper and lower eyelids: Secondary | ICD-10-CM | POA: Diagnosis not present

## 2020-11-10 DIAGNOSIS — S060X1S Concussion with loss of consciousness of 30 minutes or less, sequela: Secondary | ICD-10-CM | POA: Diagnosis not present

## 2020-11-10 DIAGNOSIS — R29898 Other symptoms and signs involving the musculoskeletal system: Secondary | ICD-10-CM

## 2020-11-10 DIAGNOSIS — S3421XD Injury of nerve root of lumbar spine, subsequent encounter: Secondary | ICD-10-CM | POA: Diagnosis not present

## 2020-11-10 DIAGNOSIS — H43813 Vitreous degeneration, bilateral: Secondary | ICD-10-CM | POA: Diagnosis not present

## 2020-11-10 DIAGNOSIS — H524 Presbyopia: Secondary | ICD-10-CM | POA: Diagnosis not present

## 2020-11-10 DIAGNOSIS — H04123 Dry eye syndrome of bilateral lacrimal glands: Secondary | ICD-10-CM | POA: Diagnosis not present

## 2020-11-10 NOTE — Patient Instructions (Addendum)
Thank you for coming in today.   I've referred you to Physical Therapy.  Let us know if you don't hear from them in one week.   Please call [facility] at [phone] to schedule your spine injection.    Recheck in 1 month.   Use the walker to prevent falls.   You have an appointment scheduled with Dr Sharlet Salina

## 2020-11-11 ENCOUNTER — Ambulatory Visit: Payer: PPO | Attending: Family Medicine

## 2020-11-11 DIAGNOSIS — S76111A Strain of right quadriceps muscle, fascia and tendon, initial encounter: Secondary | ICD-10-CM | POA: Diagnosis not present

## 2020-11-11 DIAGNOSIS — R2689 Other abnormalities of gait and mobility: Secondary | ICD-10-CM

## 2020-11-11 DIAGNOSIS — M6281 Muscle weakness (generalized): Secondary | ICD-10-CM

## 2020-11-11 DIAGNOSIS — M25551 Pain in right hip: Secondary | ICD-10-CM | POA: Insufficient documentation

## 2020-11-11 DIAGNOSIS — M62838 Other muscle spasm: Secondary | ICD-10-CM

## 2020-11-11 DIAGNOSIS — M79604 Pain in right leg: Secondary | ICD-10-CM | POA: Diagnosis not present

## 2020-11-11 NOTE — Patient Instructions (Signed)
Access Code: [email or web address] URL: [email or web address] Date: 11/11/2020 Prepared by: Claiborne Billings  Exercises Standing March with Counter Support - 2 x daily - 7 x weekly - 3 sets - 5 reps Seated March - 2 x daily - 7 x weekly - 3 sets - 5 reps Seated Hamstring Stretch - 2 x daily - 7 x weekly - 1 sets - 3 reps - 20-30 hold Supine Active Straight Leg Raise - 2 x daily - 7 x weekly - 2 sets - 5 reps Prone Knee Flexion AAROM with Overpressure - 1 x daily - 7 x weekly - 1 sets - 3 reps - 10 hold Seated Figure 4 Piriformis Stretch - 2 x daily - 7 x weekly - 1 sets - 3 reps - 20 hold

## 2020-11-11 NOTE — Therapy (Signed)
[facility] [address], [place], [place], [address] Phone: [phone]   Fax:  [phone]  Physical Therapy Evaluation  Patient Details  Name: Christina [name] MRN: [id number] Date of Birth: 1950-07-30 Referring Provider (PT): Lynne Leader, MD   Encounter Date: 11/11/2020   PT End of Session - 11/11/20 1143     Visit Number 1    Date for PT Re-Evaluation 01/06/21    Authorization Type Medicare- KX needed at 15    Progress Note Due on Visit 10    PT Start Time 1100    PT Stop Time 1141    PT Time Calculation (min) 41 min    Activity Tolerance Patient tolerated treatment well    Behavior During Therapy [facility] for tasks assessed/performed             Past Medical History:  Diagnosis Date   Abrasion of right leg 12/17/2014   Adrenal insufficiency ([place])    Breast cancer of upper-outer quadrant of left female breast ([place]) 12/07/2014   Cataract, immature    bilateral   Dental bridge present    upper - x 2   Dental crowns present    Hypothyroidism    Personal history of radiation therapy    Prediabetes     Past Surgical History:  Procedure Laterality Date   ABDOMINOPLASTY     BALLOON SINUPLASTY     BREAST LUMPECTOMY Left 2016   BREAST LUMPECTOMY WITH RADIOACTIVE SEED AND SENTINEL LYMPH NODE BIOPSY Left 12/22/2014   Procedure: BREAST LUMPECTOMY WITH RADIOACTIVE SEED AND SENTINEL LYMPH NODE BIOPSY;  Surgeon: Stark Klein, MD;  Location: [place];  Service: General;  Laterality: Left;   BUNIONECTOMY Right    HAMMER TOE SURGERY Bilateral    RHINOPLASTY     SEPTOPLASTY      There were no vitals filed for this visit.    Subjective Assessment - 11/11/20 1057     Subjective t is a previous patient with ongoing Rt LE weakness which affects gait and balance. Pt was treated at this clinic ending 06/2020 with limited change in sypmptoms.  Pt attempted NCV exam but was not able to tolerate due to  pain.  Pt had a fall with concussion on 08/12/20.  Pt was at home and fell sustaing cuncussion and had a stay at [facility] living. 09/02/20-until 11/09/20.  She has had multiple lumbar injections which seem to give partial relief for improved coordination and strength but this does not last.  Pt will have lumbar injection tomorrow.    Pertinent History frequent falls, chronic Rt Le weakness    Diagnostic tests 2018 multi-level disc dessication and lateral stenosis, right foraminal disc extrusion L3/4 with right L3 nerve root impingement    Patient Stated Goals improve gait, improve Rt LE strength    Currently in Pain? Yes    Pain Score 5    with walking   Pain Location Leg    Pain Orientation Right    Pain Descriptors / Indicators Tightness;Heaviness    Pain Type Chronic pain    Pain Onset More than a month ago    Aggravating Factors  walking, standing    Pain Relieving Factors sitting down                [facility] PT Assessment - 11/11/20 0001       Assessment   Medical Diagnosis Rt thigh pain, injury of spinal nerve root at L3 level, weakness  of Rt hip    Referring Provider (PT) Lynne Leader, MD    Onset Date/Surgical Date 10/30/18    Next MD Visit 1 month    Prior Therapy at this clinic for gait and Rt LE strength, PT at [facility]      Precautions   Precautions Fall    Precaution Comments Rt LE instability due to weakness      Restrictions   Weight Bearing Restrictions No      Balance Screen   Has the patient fallen in the past 6 months Yes    How many times? 1    Has the patient had a decrease in activity level because of a fear of falling?  Yes    Is the patient reluctant to leave their home because of a fear of falling?  Yes      [place] residence    Living Arrangements Alone    Additional Comments no stairs at home but has to be very watchful for curbs/community stairs.  Will have aide daily 4hours/day      Prior Function    Level of Independence Requires assistive device for independence;Independent with basic ADLs    Vocation Retired      Charity fundraiser Status Within Functional Limits for tasks assessed      Posture/Postural Control   Posture/Postural Control Postural limitations    Postural Limitations Forward head;Flexed trunk;Weight shift left      ROM / Strength   AROM / PROM / Strength AROM;PROM;Strength      AROM   Overall AROM  Within functional limits for tasks performed      PROM   Overall PROM  Within functional limits for tasks performed      Strength   Overall Strength Deficits    Overall Strength Comments Lt LE 4+/5 to 5/5    Strength Assessment Site Knee;Hip;Ankle    Right/Left Hip Right    Right Hip Flexion 4-/5    Right Hip Extension 4/5    Right Hip ABduction 3+/5    Right/Left Knee Right    Right Knee Flexion 4-/5    Right Knee Extension 4+/5    Right/Left Ankle Right    Right Ankle Dorsiflexion 4/5      Palpation   Palpation comment tension over Rt lateral quad with pain      Transfers   Transfers Sit to Stand;Stand to Sit    Sit to Stand 6: Modified independent (Device/Increase time);With upper extremity assist      Ambulation/Gait   Ambulation/Gait Yes    Ambulation/Gait Assistance 5: Supervision    Assistive device Rolling walker    Gait Pattern Step-to pattern;Right steppage;Right foot flat;Shuffle;Trunk flexed;Poor foot clearance - right    Ambulation Surface Level                        Objective measurements completed on examination: See above findings.               PT Education - 11/11/20 1128     Education Details Access Code: [email or web address]    Person(s) Educated Patient    Methods Explanation;Demonstration;Handout    Comprehension Verbalized understanding;Returned demonstration              PT Short Term Goals - 11/11/20 1058       PT SHORT TERM GOAL #1   Title Pt will be ind with initial HEP  Time  4    Period Weeks    Status New    Target Date 12/09/20      PT SHORT TERM GOAL #2   Title Pt will be able to perform 10 reps of SLR on Rt LE wihtout quad lag to demo improved Rt hip flexor strength/endurance    Baseline --    Time 4    Period Weeks    Status New    Target Date 12/09/20      PT SHORT TERM GOAL #3   Title Pt will be able to perform step through gait with consistent heel strike on Rt LE x 25 feet using walker    Baseline ---    Time 4    Period Weeks    Status New    Target Date 12/09/20               PT Long Term Goals - 11/11/20 1159       PT LONG TERM GOAL #1   Title Pt with achieve Rt hip flexor strength of at least 4/5 for improved daily task endurance and ambulation.    Baseline --    Time 8    Period Weeks    Status New    Target Date 01/06/21      PT LONG TERM GOAL #2   Title demonstrate Rt foot clearance with gait x 40 feet without verbal cuing from PT to improve safety with gait    Baseline ---    Time 8    Status New    Target Date 01/06/21      PT LONG TERM GOAL #3   Title perform turning using rolling walker or no device with safety and minimal need for cueing by PT    Baseline ---    Time 8    Status New    Target Date 01/06/21      PT LONG TERM GOAL #4   Title Pt will be able to demo gait  in a 3 min walk test with moderate shuffling of Rt LE to improve community function and safety    Baseline --    Period Weeks    Status New    Target Date 01/06/21      PT LONG TERM GOAL #5   Title Pt will be ind with advanced HEP    Time 8    Period Weeks    Status New    Target Date 01/06/21                    Plan - 11/11/20 1150     Clinical Impression Statement Pt is a previous patient with ongoing Rt LE weakness which affects gait and balance. Pt was treated at this clinic ending 06/2020 with limited change in functional mobility.  Pt attempted NCV exam but was not able to tolerate due to pain.  Pt had a fall with  concussion on 08/12/2020 and had a stay at [facility] living x 2 months.  Pt was released on 11/09/20  and is ambulating all distance with a rolling walker and Rt AFO to help with difficulty advancing Rt LE during swing phase of gait. She has had multiple lumbar injections which seem to give partial relief for improved coordination and strength but this does not last.  Pt will have another injection tomorrow. Pt ambulates with poor advancement of Rt LE with poor foot clearance due to this.  Pt requires constant verbal cueing  for step length and hip flexor advancement on the Rt.  Pt is particularly challenged with turns.  Lt LE strength is 4+/5 to 5/5 throughout.  Rt hip flexor strength is 4-/5 and pt fatigues after 5 SLR and quad lag with fatigue.  Rt hip abduction is 3+/5 and knee flexion is 4-/5. All other Rt LE strength is 4+/5.  Pt with tension and palpable tenderness over Rt lateral quad and pt reports 3-5/10 Rt LE pain with standing and walking.  Pt will benefit from skilled PT to address Rt LE functional strength, gait and safety at home and in the community.    Personal Factors and Comorbidities Comorbidity 2    Comorbidities chronic gait abnormality, Rt LE weaknss, L3 nerve root impingement    Examination-Activity Limitations Locomotion Level;Squat;Stairs;Stand;Transfers    Examination-Participation Restrictions Cleaning;Community Activity;Meal Prep    Stability/Clinical Decision Making Evolving/Moderate complexity    Clinical Decision Making Moderate    Rehab Potential Good    PT Frequency 2x / week    PT Duration 8 weeks    PT Treatment/Interventions ADLs/Self Care Home Management;Cryotherapy;Traction;Electrical Stimulation;Gait training;Stair training;Functional mobility training;Therapeutic activities;Therapeutic exercise;Balance training;Neuromuscular re-education;Patient/family education;Manual techniques;Passive range of motion;Dry needling;Taping    PT Next Visit Plan Rt LE  strength, gait, endurance tasks    PT Home Exercise Plan Access Code: [email or web address]    Consulted and Agree with Plan of Care Patient             Patient will benefit from skilled therapeutic intervention in order to improve the following deficits and impairments:  Abnormal gait, Decreased activity tolerance, Decreased strength, Impaired flexibility, Pain, Difficulty walking, Decreased endurance, Decreased balance  Visit Diagnosis: Muscle weakness (generalized) - Plan: PT plan of care cert/re-cert  Other abnormalities of gait and mobility - Plan: PT plan of care cert/re-cert  Pain in right leg - Plan: PT plan of care cert/re-cert  Other muscle spasm - Plan: PT plan of care cert/re-cert     Problem List Patient Active Problem List   Diagnosis Date Noted   Arthritis of right hip 09/17/2019   Labral tear of right hip joint 08/06/2019   Gluteal tendonitis of right buttock 05/22/2019   Tear of left gluteus medius tendon 01/04/2019   Arthritis of left hip 03/20/2018   Greater trochanteric bursitis of right hip 06/13/2017   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 11/29/2016   Breast cancer of upper-outer quadrant of left female breast ([place]) 12/07/2014   Acute recurrent sinusitis 01/17/2013     Sigurd Sos, PT 11/11/20 12:04 PM  [place] [address], [place], [place], [address] Phone: [phone]   Fax:  [phone]  Name: Christina [name] MRN: [id number] Date of Birth: 10-15-1950

## 2020-11-12 ENCOUNTER — Other Ambulatory Visit: Payer: Self-pay

## 2020-11-12 ENCOUNTER — Ambulatory Visit: Payer: PPO | Admitting: Neurology

## 2020-11-12 ENCOUNTER — Ambulatory Visit
Admission: RE | Admit: 2020-11-12 | Discharge: 2020-11-12 | Disposition: A | Discharge status: Home / Self Care | Payer: PPO | Source: Ambulatory Visit | Attending: Family Medicine | Admitting: Family Medicine

## 2020-11-12 DIAGNOSIS — R269 Unspecified abnormalities of gait and mobility: Secondary | ICD-10-CM

## 2020-11-12 DIAGNOSIS — S3421XD Injury of nerve root of lumbar spine, subsequent encounter: Secondary | ICD-10-CM

## 2020-11-12 DIAGNOSIS — R29898 Other symptoms and signs involving the musculoskeletal system: Secondary | ICD-10-CM

## 2020-11-12 DIAGNOSIS — R531 Weakness: Secondary | ICD-10-CM | POA: Diagnosis not present

## 2020-11-12 MED ORDER — IOPAMIDOL (ISOVUE-M 200) INJECTION 41%
1.0000 mL | Freq: Once | INTRAMUSCULAR | Status: AC
  Administered 2020-11-12: 1 mL via EPIDURAL

## 2020-11-12 MED ORDER — METHYLPREDNISOLONE ACETATE 40 MG/ML INJ SUSP (RADIOLOG
80.0000 mg | Freq: Once | INTRAMUSCULAR | Status: AC
  Administered 2020-11-12: 80 mg via EPIDURAL

## 2020-11-12 NOTE — Discharge Instructions (Signed)

## 2020-11-15 ENCOUNTER — Encounter: Payer: Self-pay | Admitting: Physical Therapy

## 2020-11-15 ENCOUNTER — Ambulatory Visit: Payer: PPO | Admitting: Physical Therapy

## 2020-11-15 ENCOUNTER — Other Ambulatory Visit: Payer: Self-pay

## 2020-11-15 DIAGNOSIS — R2689 Other abnormalities of gait and mobility: Secondary | ICD-10-CM

## 2020-11-15 DIAGNOSIS — M25551 Pain in right hip: Secondary | ICD-10-CM

## 2020-11-15 DIAGNOSIS — S76111A Strain of right quadriceps muscle, fascia and tendon, initial encounter: Secondary | ICD-10-CM

## 2020-11-15 DIAGNOSIS — M6281 Muscle weakness (generalized): Secondary | ICD-10-CM | POA: Diagnosis not present

## 2020-11-15 DIAGNOSIS — M79604 Pain in right leg: Secondary | ICD-10-CM

## 2020-11-15 DIAGNOSIS — M62838 Other muscle spasm: Secondary | ICD-10-CM

## 2020-11-15 NOTE — Therapy (Signed)
[facility] [address], [place], [place], [address] Phone: [phone]   Fax:  [phone]  Physical Therapy Treatment  Patient Details  Name: Christina [name] MRN: [id number] Date of Birth: 1950-12-10 Referring Provider (PT): Lynne Leader, MD   Encounter Date: 11/15/2020   PT End of Session - 11/15/20 1006     Visit Number 2    Date for PT Re-Evaluation 01/06/21    Authorization Type Medicare- KX needed at 15    Progress Note Due on Visit 10    PT Start Time 1006    PT Stop Time 1056    PT Time Calculation (min) 50 min    Activity Tolerance Patient tolerated treatment well    Behavior During Therapy [facility] for tasks assessed/performed             Past Medical History:  Diagnosis Date   Abrasion of right leg 12/17/2014   Adrenal insufficiency ([place])    Breast cancer of upper-outer quadrant of left female breast ([place]) 12/07/2014   Cataract, immature    bilateral   Dental bridge present    upper - x 2   Dental crowns present    Hypothyroidism    Personal history of radiation therapy    Prediabetes     Past Surgical History:  Procedure Laterality Date   ABDOMINOPLASTY     BALLOON SINUPLASTY     BREAST LUMPECTOMY Left 2016   BREAST LUMPECTOMY WITH RADIOACTIVE SEED AND SENTINEL LYMPH NODE BIOPSY Left 12/22/2014   Procedure: BREAST LUMPECTOMY WITH RADIOACTIVE SEED AND SENTINEL LYMPH NODE BIOPSY;  Surgeon: Stark Klein, MD;  Location: [place];  Service: General;  Laterality: Left;   BUNIONECTOMY Right    HAMMER TOE SURGERY Bilateral    RHINOPLASTY     SEPTOPLASTY      There were no vitals filed for this visit.   Subjective Assessment - 11/15/20 1013     Subjective Had injection and did well.    Pertinent History frequent falls, chronic Rt Le weakness    Diagnostic tests 2018 multi-level disc dessication and lateral stenosis, right foraminal disc extrusion L3/4 with right L3 nerve root  impingement    Currently in Pain? No/denies    Multiple Pain Sites No                               [facility] Adult PT Treatment/Exercise - 11/15/20 0001       Ambulation/Gait   Ambulation/Gait Yes    Ambulation/Gait Assistance 6: Modified independent (Device/Increase time)    Ambulation Distance (Feet) --   4 lengths of the hard floor in the gym   Assistive device --   Rollator with 4 wheels   Ambulation Surface Level    Gait Comments Pt likes the brake feature of her Rollator but having teh 4 wheels makes it difficult for pt control at times. PTA VC to contract her glutes with each step to help increase her control, also VC for more consistent heel strike: shoe elevator did require tightening up:      Knee/Hip Exercises: Public affairs consultant --   prone LTLE push RTLE 3x 10sec, Instructed caregiver how to do 2x 20 sec     Knee/Hip Exercises: Supine   Straight Leg Raises AROM;Strengthening;2 sets;5 reps      Manual Therapy   Manual Therapy --   Static stretch to RT quad,  instructed caregiver how to do at home. Goo dreturn demo                     PT Short Term Goals - 11/11/20 1058       PT SHORT TERM GOAL #1   Title Pt will be ind with initial HEP    Time 4    Period Weeks    Status New    Target Date 12/09/20      PT SHORT TERM GOAL #2   Title Pt will be able to perform 10 reps of SLR on Rt LE wihtout quad lag to demo improved Rt hip flexor strength/endurance    Baseline --    Time 4    Period Weeks    Status New    Target Date 12/09/20      PT SHORT TERM GOAL #3   Title Pt will be able to perform step through gait with consistent heel strike on Rt LE x 25 feet using walker    Baseline ---    Time 4    Period Weeks    Status New    Target Date 12/09/20               PT Long Term Goals - 11/11/20 1159       PT LONG TERM GOAL #1   Title Pt with achieve Rt hip flexor strength of at least 4/5 for improved daily task  endurance and ambulation.    Baseline --    Time 8    Period Weeks    Status New    Target Date 01/06/21      PT LONG TERM GOAL #2   Title demonstrate Rt foot clearance with gait x 40 feet without verbal cuing from PT to improve safety with gait    Baseline ---    Time 8    Status New    Target Date 01/06/21      PT LONG TERM GOAL #3   Title perform turning using rolling walker or no device with safety and minimal need for cueing by PT    Baseline ---    Time 8    Status New    Target Date 01/06/21      PT LONG TERM GOAL #4   Title Pt will be able to demo gait  in a 3 min walk test with moderate shuffling of Rt LE to improve community function and safety    Baseline --    Period Weeks    Status New    Target Date 01/06/21      PT LONG TERM GOAL #5   Title Pt will be ind with advanced HEP    Time 8    Period Weeks    Status New    Target Date 01/06/21                   Plan - 11/15/20 1015     Clinical Impression Statement Pt arrives pain free, reports her pain is well managed. She brought with her a second walker Naval architect ) with 4 wheels that she likes, especially the brake feature but it "gets away from her." Pt's caregiver also present to learn HEP as well as other cuing she can assist pt with. She will be with pt 5 days a week. Pt was able to walk short distancestoday in the clinic with the Rollator trying to facilitate her glutes the best she could as  well as trying to consistently clear her RT foot from the floor. Pt is compliant with initial HEP, added sit to stand today. Both pt and caregiver were educated in new HEP.    Personal Factors and Comorbidities Comorbidity 2    Comorbidities chronic gait abnormality, Rt LE weaknss, L3 nerve root impingement    Examination-Activity Limitations Locomotion Level;Squat;Stairs;Stand;Transfers    Examination-Participation Restrictions Cleaning;Community Activity;Meal Prep    Stability/Clinical Decision Making  Evolving/Moderate complexity    Rehab Potential Good    PT Frequency 2x / week    PT Duration 8 weeks    PT Treatment/Interventions ADLs/Self Care Home Management;Cryotherapy;Traction;Electrical Stimulation;Gait training;Stair training;Functional mobility training;Therapeutic activities;Therapeutic exercise;Balance training;Neuromuscular re-education;Patient/family education;Manual techniques;Passive range of motion;Dry needling;Taping    PT Next Visit Plan Continue with Rt LE strength, gait, endurance tasks    PT Home Exercise Plan Access Code: [email or web address]             Patient will benefit from skilled therapeutic intervention in order to improve the following deficits and impairments:  Abnormal gait, Decreased activity tolerance, Decreased strength, Impaired flexibility, Pain, Difficulty walking, Decreased endurance, Decreased balance  Visit Diagnosis: Muscle weakness (generalized)  Other abnormalities of gait and mobility  Pain in right leg  Other muscle spasm  Pain in right hip  Strain of right quadriceps, initial encounter     Problem List Patient Active Problem List   Diagnosis Date Noted   Arthritis of right hip 09/17/2019   Labral tear of right hip joint 08/06/2019   Gluteal tendonitis of right buttock 05/22/2019   Tear of left gluteus medius tendon 01/04/2019   Arthritis of left hip 03/20/2018   Greater trochanteric bursitis of right hip 06/13/2017   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, [place], [place], [address] Phone: [phone]   Fax:  [phone]  Name: Christina [name] MRN: [id number] Date of Birth: 12-Jan-1951

## 2020-11-17 ENCOUNTER — Other Ambulatory Visit: Payer: Self-pay | Admitting: General Surgery

## 2020-11-17 DIAGNOSIS — Z9889 Other specified postprocedural states: Secondary | ICD-10-CM

## 2020-11-18 ENCOUNTER — Other Ambulatory Visit: Payer: Self-pay

## 2020-11-18 ENCOUNTER — Ambulatory Visit: Payer: PPO

## 2020-11-18 DIAGNOSIS — R2689 Other abnormalities of gait and mobility: Secondary | ICD-10-CM

## 2020-11-18 DIAGNOSIS — M62838 Other muscle spasm: Secondary | ICD-10-CM

## 2020-11-18 DIAGNOSIS — M6281 Muscle weakness (generalized): Secondary | ICD-10-CM | POA: Diagnosis not present

## 2020-11-18 DIAGNOSIS — M79604 Pain in right leg: Secondary | ICD-10-CM

## 2020-11-18 NOTE — Patient Instructions (Signed)
Access Code: [email or web address] URL: [email or web address] Date: 11/18/2020 Prepared by: Claiborne Billings  Standing Weight Shift - 1 x daily - 7 x weekly - 2 sets - 10 reps Stride Stance Weight Shift - 1 x daily - 7 x weekly - 2 sets - 10 reps

## 2020-11-18 NOTE — Therapy (Signed)
[facility] [address], [place], [place], [address] Phone: [phone]   Fax:  [phone]  Physical Therapy Treatment  Patient Details  Name: Christina [name] MRN: [id number] Date of Birth: 1951-01-27 Referring Provider (PT): Lynne Leader, MD   Encounter Date: 11/18/2020   PT End of Session - 11/18/20 1055     Visit Number 3    Date for PT Re-Evaluation 01/06/21    Authorization Type Medicare- KX needed at 15    Progress Note Due on Visit 10    PT Start Time 1016    PT Stop Time 1054    PT Time Calculation (min) 38 min    Activity Tolerance Patient tolerated treatment well    Behavior During Therapy [facility] for tasks assessed/performed             Past Medical History:  Diagnosis Date   Abrasion of right leg 12/17/2014   Adrenal insufficiency ([place])    Breast cancer of upper-outer quadrant of left female breast ([place]) 12/07/2014   Cataract, immature    bilateral   Dental bridge present    upper - x 2   Dental crowns present    Hypothyroidism    Personal history of radiation therapy    Prediabetes     Past Surgical History:  Procedure Laterality Date   ABDOMINOPLASTY     BALLOON SINUPLASTY     BREAST LUMPECTOMY Left 2016   BREAST LUMPECTOMY WITH RADIOACTIVE SEED AND SENTINEL LYMPH NODE BIOPSY Left 12/22/2014   Procedure: BREAST LUMPECTOMY WITH RADIOACTIVE SEED AND SENTINEL LYMPH NODE BIOPSY;  Surgeon: Stark Klein, MD;  Location: [place];  Service: General;  Laterality: Left;   BUNIONECTOMY Right    HAMMER TOE SURGERY Bilateral    RHINOPLASTY     SEPTOPLASTY      There were no vitals filed for this visit.   Subjective Assessment - 11/18/20 1016     Subjective I feel horrible first thing in the morning.  As I warm up, I feel better.    Currently in Pain? No/denies    Pain Score --   some pain with standing                              [facility] Adult PT  Treatment/Exercise - 11/18/20 0001       Knee/Hip Exercises: Standing   Hip Flexion Right;Stengthening;2 sets;10 reps    Hip Flexion Limitations tap on to edge of treadmill    Other Standing Knee Exercises lateral step over hurdle at edge of treadmill x10 on Rt      Knee/Hip Exercises: Seated   Ball Squeeze 5" hold x 20    Clamshell with TheraBand Blue   2x10   Other Seated Knee/Hip Exercises IR against yellow band 2x10    Sit to Sand 20 reps;with UE support   blue band around thighs                   PT Education - 11/18/20 1048     Education Details Access Code: [email or web address]    Person(s) Educated Patient    Methods Explanation;Demonstration;Handout    Comprehension Verbalized understanding;Returned demonstration              PT Short Term Goals - 11/11/20 1058       PT SHORT TERM GOAL #1   Title Pt will be ind with initial HEP  Time 4    Period Weeks    Status New    Target Date 12/09/20      PT SHORT TERM GOAL #2   Title Pt will be able to perform 10 reps of SLR on Rt LE wihtout quad lag to demo improved Rt hip flexor strength/endurance    Baseline --    Time 4    Period Weeks    Status New    Target Date 12/09/20      PT SHORT TERM GOAL #3   Title Pt will be able to perform step through gait with consistent heel strike on Rt LE x 25 feet using walker    Baseline ---    Time 4    Period Weeks    Status New    Target Date 12/09/20               PT Long Term Goals - 11/11/20 1159       PT LONG TERM GOAL #1   Title Pt with achieve Rt hip flexor strength of at least 4/5 for improved daily task endurance and ambulation.    Baseline --    Time 8    Period Weeks    Status New    Target Date 01/06/21      PT LONG TERM GOAL #2   Title demonstrate Rt foot clearance with gait x 40 feet without verbal cuing from PT to improve safety with gait    Baseline ---    Time 8    Status New    Target Date 01/06/21      PT LONG TERM GOAL #3    Title perform turning using rolling walker or no device with safety and minimal need for cueing by PT    Baseline ---    Time 8    Status New    Target Date 01/06/21      PT LONG TERM GOAL #4   Title Pt will be able to demo gait  in a 3 min walk test with moderate shuffling of Rt LE to improve community function and safety    Baseline --    Period Weeks    Status New    Target Date 01/06/21      PT LONG TERM GOAL #5   Title Pt will be ind with advanced HEP    Time 8    Period Weeks    Status New    Target Date 01/06/21                   Plan - 11/18/20 1039     Clinical Impression Statement Pt arrives pain free, reports her pain is well managed. Session focused on specific hip flexor and comprehensive strength in both sitting and standing. Pt fatigued with hip flexor activation in all positions.  Focus is on activation of hip flexor and comprehensive hip strength as gait training follow-through is poor at this time.  Pt becomes frustrated by inability to clear Rt LE with swing through and is not successful with clearing foot more than 1-2 steps.  Pt is using a rolling walker for all distances and appears safe with this device.  Pt will continue to benefit from skilled PT to improve Rt LE strength, advancement of Rt LE with gait and endurance activities due to chronic Rt LE weakness.    Rehab Potential Good    PT Frequency 2x / week    PT Duration 8 weeks  PT Treatment/Interventions ADLs/Self Care Home Management;Cryotherapy;Traction;Electrical Stimulation;Gait training;Stair training;Functional mobility training;Therapeutic activities;Therapeutic exercise;Balance training;Neuromuscular re-education;Patient/family education;Manual techniques;Passive range of motion;Dry needling;Taping    PT Next Visit Plan Continue with Rt LE strength, stability, endurance tasks    PT Home Exercise Plan Access Code: [email or web address]    Recommended Other Services initial cert is signed    Consulted  and Agree with Plan of Care Patient             Patient will benefit from skilled therapeutic intervention in order to improve the following deficits and impairments:  Abnormal gait, Decreased activity tolerance, Decreased strength, Impaired flexibility, Pain, Difficulty walking, Decreased endurance, Decreased balance  Visit Diagnosis: Muscle weakness (generalized)  Other abnormalities of gait and mobility  Pain in right leg  Other muscle spasm     Problem List Patient Active Problem List   Diagnosis Date Noted   Arthritis of right hip 09/17/2019   Labral tear of right hip joint 08/06/2019   Gluteal tendonitis of right buttock 05/22/2019   Tear of left gluteus medius tendon 01/04/2019   Arthritis of left hip 03/20/2018   Greater trochanteric bursitis of right hip 06/13/2017   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 11/29/2016   Breast cancer of upper-outer quadrant of left female breast ([place]) 12/07/2014   Acute recurrent sinusitis 01/17/2013    Sigurd Sos, PT 11/18/20 11:00 AM   [facility] [address], [place], [place], [address] Phone: [phone]   Fax:  [phone]  Name: RIKKI TROSPER MRN: [id number] Date of Birth: 08-Feb-1951

## 2020-11-22 ENCOUNTER — Ambulatory Visit: Payer: PPO

## 2020-11-22 ENCOUNTER — Other Ambulatory Visit: Payer: Self-pay

## 2020-11-22 DIAGNOSIS — M6281 Muscle weakness (generalized): Secondary | ICD-10-CM

## 2020-11-22 DIAGNOSIS — M79604 Pain in right leg: Secondary | ICD-10-CM

## 2020-11-22 DIAGNOSIS — R2689 Other abnormalities of gait and mobility: Secondary | ICD-10-CM

## 2020-11-22 DIAGNOSIS — M62838 Other muscle spasm: Secondary | ICD-10-CM

## 2020-11-22 NOTE — Therapy (Signed)
[facility] [address], [place], [place], [address] Phone: [phone]   Fax:  [phone]  Physical Therapy Treatment  Patient Details  Name: Christina [name] MRN: [id number] Date of Birth: [birth year] Referring Provider (PT): Lynne Leader, MD   Encounter Date: 11/22/2020   PT End of Session - 11/22/20 1221     Visit Number 4    Date for PT Re-Evaluation 01/06/21    Authorization Type Medicare- KX needed at 15    Progress Note Due on Visit 10    PT Start Time [id number]    PT Stop Time 1218    PT Time Calculation (min) 33 min    Activity Tolerance Patient limited by fatigue    Behavior During Therapy [facility] for tasks assessed/performed             Past Medical History:  Diagnosis Date   Abrasion of right leg 12/17/2014   Adrenal insufficiency ([place])    Breast cancer of upper-outer quadrant of left female breast ([place]) 12/07/2014   Cataract, immature    bilateral   Dental bridge present    upper - x 2   Dental crowns present    Hypothyroidism    Personal history of radiation therapy    Prediabetes     Past Surgical History:  Procedure Laterality Date   ABDOMINOPLASTY     BALLOON SINUPLASTY     BREAST LUMPECTOMY Left 2016   BREAST LUMPECTOMY WITH RADIOACTIVE SEED AND SENTINEL LYMPH NODE BIOPSY Left 12/22/2014   Procedure: BREAST LUMPECTOMY WITH RADIOACTIVE SEED AND SENTINEL LYMPH NODE BIOPSY;  Surgeon: Stark Klein, MD;  Location: [place];  Service: General;  Laterality: Left;   BUNIONECTOMY Right    HAMMER TOE SURGERY Bilateral    RHINOPLASTY     SEPTOPLASTY      There were no vitals filed for this visit.   Subjective Assessment - 11/22/20 1145     Subjective I was sore last time.  I have fallen twice since i've been here.  I've been doing my NuStep at home.    Pertinent History frequent falls, chronic Rt LE weakness    Currently in Pain? Yes    Pain Score 1     Pain Location Buttocks     Pain Orientation Right    Pain Descriptors / Indicators Tightness    Pain Type Chronic pain                               [facility] Adult PT Treatment/Exercise - 11/22/20 0001       Knee/Hip Exercises: Standing   Other Standing Knee Exercises weight shifting 3 ways on level surface- verbal cues for hip alignment and stabilizing in Rt LE      Knee/Hip Exercises: Seated   Ball Squeeze 5" hold x 20    Clamshell with TheraBand Blue   2x10   Other Seated Knee/Hip Exercises IR against yellow band 2x10      Knee/Hip Exercises: Supine   Bridges Strengthening;Both;2 sets;5 reps    Straight Leg Raises AROM;Strengthening;2 sets;5 reps    Knee Flexion Strengthening;Right;2 sets;5 reps    Knee Flexion Limitations using slider      Knee/Hip Exercises: Prone   Other Prone Exercises passive knee flexion by PT to tolerance x 5  PT Short Term Goals - 11/11/20 1058       PT SHORT TERM GOAL #1   Title Pt will be ind with initial HEP    Time 4    Period Weeks    Status New    Target Date 12/09/20      PT SHORT TERM GOAL #2   Title Pt will be able to perform 10 reps of SLR on Rt LE wihtout quad lag to demo improved Rt hip flexor strength/endurance    Baseline --    Time 4    Period Weeks    Status New    Target Date 12/09/20      PT SHORT TERM GOAL #3   Title Pt will be able to perform step through gait with consistent heel strike on Rt LE x 25 feet using walker    Baseline ---    Time 4    Period Weeks    Status New    Target Date 12/09/20               PT Long Term Goals - 11/11/20 1159       PT LONG TERM GOAL #1   Title Pt with achieve Rt hip flexor strength of at least 4/5 for improved daily task endurance and ambulation.    Baseline --    Time 8    Period Weeks    Status New    Target Date 01/06/21      PT LONG TERM GOAL #2   Title demonstrate Rt foot clearance with gait x 40 feet without verbal cuing from PT  to improve safety with gait    Baseline ---    Time 8    Status New    Target Date 01/06/21      PT LONG TERM GOAL #3   Title perform turning using rolling walker or no device with safety and minimal need for cueing by PT    Baseline ---    Time 8    Status New    Target Date 01/06/21      PT LONG TERM GOAL #4   Title Pt will be able to demo gait  in a 3 min walk test with moderate shuffling of Rt LE to improve community function and safety    Baseline --    Period Weeks    Status New    Target Date 01/06/21      PT LONG TERM GOAL #5   Title Pt will be ind with advanced HEP    Time 8    Period Weeks    Status New    Target Date 01/06/21                   Plan - 11/22/20 1205     Clinical Impression Statement Pt arrived and stated that she was sore after last session and had 2 falls 2-3 days after the session.  Pt was closely monitored throughout session to provide cueing and monitor for fatigue today. seesion was shorteded due to fatigue with exercise and level of post-session fatigue will be assessed next session to determine if additional exercise will be tolerated.   Lower level exercise was utilized to prevent excessive soreness.   Pt is using a rolling walker for all distances.  Pt required max verbal cues for stand to sit transition using the walker due to not keeping walker with her all the way to the chair.  Pt will continue to  benefit from skilled PT to improve Rt LE strength, advancement of Rt LE with gait and endurance activities due to chronic Rt LE weakness.    PT Frequency 2x / week    PT Duration 8 weeks    PT Treatment/Interventions ADLs/Self Care Home Management;Cryotherapy;Traction;Electrical Stimulation;Gait training;Stair training;Functional mobility training;Therapeutic activities;Therapeutic exercise;Balance training;Neuromuscular re-education;Patient/family education;Manual techniques;Passive range of motion;Dry needling;Taping    PT Next Visit Plan  Continue with Rt LE strength, stability, endurance tasks    PT Home Exercise Plan Access Code: [email or web address]    Consulted and Agree with Plan of Care Patient             Patient will benefit from skilled therapeutic intervention in order to improve the following deficits and impairments:  Abnormal gait, Decreased activity tolerance, Decreased strength, Impaired flexibility, Pain, Difficulty walking, Decreased endurance, Decreased balance  Visit Diagnosis: Muscle weakness (generalized)  Other abnormalities of gait and mobility  Pain in right leg  Other muscle spasm     Problem List Patient Active Problem List   Diagnosis Date Noted   Arthritis of right hip 09/17/2019   Labral tear of right hip joint 08/06/2019   Gluteal tendonitis of right buttock 05/22/2019   Tear of left gluteus medius tendon 01/04/2019   Arthritis of left hip 03/20/2018   Greater trochanteric bursitis of right hip 06/13/2017   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 11/29/2016   Breast cancer of upper-outer quadrant of left female breast ([place]) 12/07/2014   Acute recurrent sinusitis 01/17/2013    Sigurd Sos, PT 11/22/20 12:24 PM   [facility] [address] [place], [place], [address] Phone: [phone]   Fax:  [phone]  Name: Christina [name] MRN: [id number] Date of Birth: 07-16-1950

## 2020-11-24 ENCOUNTER — Ambulatory Visit: Payer: PPO

## 2020-11-24 ENCOUNTER — Other Ambulatory Visit: Payer: Self-pay

## 2020-11-24 DIAGNOSIS — M6281 Muscle weakness (generalized): Secondary | ICD-10-CM

## 2020-11-24 DIAGNOSIS — R2689 Other abnormalities of gait and mobility: Secondary | ICD-10-CM

## 2020-11-24 DIAGNOSIS — M62838 Other muscle spasm: Secondary | ICD-10-CM

## 2020-11-24 DIAGNOSIS — M79604 Pain in right leg: Secondary | ICD-10-CM

## 2020-11-24 NOTE — Therapy (Signed)
[facility] [address], [place], [place], [address] Phone: [phone]   Fax:  [phone]  Physical Therapy Treatment  Patient Details  Name: Christina [name] MRN: [id number] Date of Birth: 1950/11/14 Referring Provider (PT): Lynne Leader, MD   Encounter Date: 11/24/2020   PT End of Session - 11/24/20 1131     Visit Number 5    Date for PT Re-Evaluation 01/06/21    Authorization Type Medicare- KX needed at 15    Progress Note Due on Visit 10    PT Start Time 1101    PT Stop Time 1130    PT Time Calculation (min) 29 min    Activity Tolerance Patient limited by fatigue    Behavior During Therapy [facility] for tasks assessed/performed             Past Medical History:  Diagnosis Date   Abrasion of right leg 12/17/2014   Adrenal insufficiency ([place])    Breast cancer of upper-outer quadrant of left female breast ([place]) 12/07/2014   Cataract, immature    bilateral   Dental bridge present    upper - x 2   Dental crowns present    Hypothyroidism    Personal history of radiation therapy    Prediabetes     Past Surgical History:  Procedure Laterality Date   ABDOMINOPLASTY     BALLOON SINUPLASTY     BREAST LUMPECTOMY Left 2016   BREAST LUMPECTOMY WITH RADIOACTIVE SEED AND SENTINEL LYMPH NODE BIOPSY Left 12/22/2014   Procedure: BREAST LUMPECTOMY WITH RADIOACTIVE SEED AND SENTINEL LYMPH NODE BIOPSY;  Surgeon: Stark Klein, MD;  Location: [place];  Service: General;  Laterality: Left;   BUNIONECTOMY Right    HAMMER TOE SURGERY Bilateral    RHINOPLASTY     SEPTOPLASTY      There were no vitals filed for this visit.   Subjective Assessment - 11/24/20 1105     Subjective I was sore last time but much better than after the previous session.  The level of activity was good.    Currently in Pain? No/denies                               [facility] Adult PT Treatment/Exercise -  11/24/20 0001       Knee/Hip Exercises: Stretches   Piriformis Stretch Right;3 reps;20 seconds      Knee/Hip Exercises: Standing   Hip Flexion Stengthening;Right;2 sets;10 reps    Hip Flexion Limitations tap on to top of low cone using UE support.  Verbal cues to reduce circumduction.    Other Standing Knee Exercises lateral step over hurdle at edge of treadmill x10 on Rt    Other Standing Knee Exercises weight shifting 3 ways on foam pad- verbal cues for hip alignment and stabilizing in Rt LE      Knee/Hip Exercises: Seated   Ball Squeeze 5" hold x 20    Clamshell with TheraBand Yellow   2x10   Other Seated Knee/Hip Exercises IR against yellow band 2x10                      PT Short Term Goals - 11/11/20 1058       PT SHORT TERM GOAL #1   Title Pt will be ind with initial HEP    Time 4    Period Weeks    Status New  Target Date 12/09/20      PT SHORT TERM GOAL #2   Title Pt will be able to perform 10 reps of SLR on Rt LE wihtout quad lag to demo improved Rt hip flexor strength/endurance    Baseline --    Time 4    Period Weeks    Status New    Target Date 12/09/20      PT SHORT TERM GOAL #3   Title Pt will be able to perform step through gait with consistent heel strike on Rt LE x 25 feet using walker    Baseline ---    Time 4    Period Weeks    Status New    Target Date 12/09/20               PT Long Term Goals - 11/11/20 1159       PT LONG TERM GOAL #1   Title Pt with achieve Rt hip flexor strength of at least 4/5 for improved daily task endurance and ambulation.    Baseline --    Time 8    Period Weeks    Status New    Target Date 01/06/21      PT LONG TERM GOAL #2   Title demonstrate Rt foot clearance with gait x 40 feet without verbal cuing from PT to improve safety with gait    Baseline ---    Time 8    Status New    Target Date 01/06/21      PT LONG TERM GOAL #3   Title perform turning using rolling walker or no device with  safety and minimal need for cueing by PT    Baseline ---    Time 8    Status New    Target Date 01/06/21      PT LONG TERM GOAL #4   Title Pt will be able to demo gait  in a 3 min walk test with moderate shuffling of Rt LE to improve community function and safety    Baseline --    Period Weeks    Status New    Target Date 01/06/21      PT LONG TERM GOAL #5   Title Pt will be ind with advanced HEP    Time 8    Period Weeks    Status New    Target Date 01/06/21                   Plan - 11/24/20 1114     Clinical Impression Statement Pt reported that level of activity was good last session and she was not overly fatigued. Pt was closely monitored throughout session to provide cueing and monitor for fatigue today. Session was shortened due to fatigue with exercise and level of post-session fatigue will be assessed next session to determine if additional exercise will be tolerated.   Lower level exercise was utilized to prevent excessive soreness.   Pt is using a rolling walker for all distances. She needs to excessively flex Rt hip to clear her foot from the floor and due to Rt hip flexor weakness and endurance deficits, she fatigues very quickly with gait.   Sit to stand transition is improved today.  Pt will continue to benefit from skilled PT to improve Rt LE strength, advancement of Rt LE with gait and endurance activities due to chronic Rt LE weakness.    PT Frequency 2x / week    PT Duration 8 weeks  PT Treatment/Interventions ADLs/Self Care Home Management;Cryotherapy;Traction;Electrical Stimulation;Gait training;Stair training;Functional mobility training;Therapeutic activities;Therapeutic exercise;Balance training;Neuromuscular re-education;Patient/family education;Manual techniques;Passive range of motion;Dry needling;Taping    PT Next Visit Plan Continue with Rt LE strength, stability, endurance tasks    PT Home Exercise Plan Access Code: [email or web address]    Consulted and  Agree with Plan of Care Patient             Patient will benefit from skilled therapeutic intervention in order to improve the following deficits and impairments:  Abnormal gait, Decreased activity tolerance, Decreased strength, Impaired flexibility, Pain, Difficulty walking, Decreased endurance, Decreased balance  Visit Diagnosis: Other abnormalities of gait and mobility  Muscle weakness (generalized)  Pain in right leg  Other muscle spasm     Problem List Patient Active Problem List   Diagnosis Date Noted   Arthritis of right hip 09/17/2019   Labral tear of right hip joint 08/06/2019   Gluteal tendonitis of right buttock 05/22/2019   Tear of left gluteus medius tendon 01/04/2019   Arthritis of left hip 03/20/2018   Greater trochanteric bursitis of right hip 06/13/2017   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 11/29/2016   Breast cancer of upper-outer quadrant of left female breast ([place]) 12/07/2014   Acute recurrent sinusitis 01/17/2013    Sigurd Sos, PT 11/24/20 11:34 AM  [facility] [address], [place], [place], [address] Phone: [phone]   Fax:  [phone]  Name: NONNA KUTNEY MRN: [id number] Date of Birth: [birth year]

## 2020-11-29 ENCOUNTER — Ambulatory Visit: Payer: PPO

## 2020-12-03 ENCOUNTER — Other Ambulatory Visit: Payer: Self-pay

## 2020-12-03 ENCOUNTER — Encounter: Payer: Self-pay | Admitting: Physical Therapy

## 2020-12-03 ENCOUNTER — Ambulatory Visit: Payer: PPO | Attending: Family Medicine | Admitting: Physical Therapy

## 2020-12-03 DIAGNOSIS — S76111A Strain of right quadriceps muscle, fascia and tendon, initial encounter: Secondary | ICD-10-CM | POA: Insufficient documentation

## 2020-12-03 DIAGNOSIS — M25551 Pain in right hip: Secondary | ICD-10-CM | POA: Insufficient documentation

## 2020-12-03 DIAGNOSIS — M6281 Muscle weakness (generalized): Secondary | ICD-10-CM | POA: Insufficient documentation

## 2020-12-03 DIAGNOSIS — M79604 Pain in right leg: Secondary | ICD-10-CM | POA: Insufficient documentation

## 2020-12-03 DIAGNOSIS — R2689 Other abnormalities of gait and mobility: Secondary | ICD-10-CM | POA: Insufficient documentation

## 2020-12-03 DIAGNOSIS — M62838 Other muscle spasm: Secondary | ICD-10-CM | POA: Diagnosis not present

## 2020-12-03 NOTE — Therapy (Signed)
[facility] [address], [place], [place], [address] Phone: [phone]   Fax:  [phone]  Physical Therapy Treatment  Patient Details  Name: Christina [name] MRN: [id number] Date of Birth: Oct 20, 1950 Referring Provider (PT): Lynne Leader, MD   Encounter Date: 12/03/2020   PT End of Session - 12/03/20 1012     Visit Number 6    Date for PT Re-Evaluation 01/06/21    Authorization Type Medicare- KX needed at 15    Progress Note Due on Visit 10    PT Start Time 1012    PT Stop Time 1050    PT Time Calculation (min) 38 min    Activity Tolerance Patient limited by fatigue    Behavior During Therapy [facility] for tasks assessed/performed             Past Medical History:  Diagnosis Date   Abrasion of right leg 12/17/2014   Adrenal insufficiency ([place])    Breast cancer of upper-outer quadrant of left female breast ([place]) 12/07/2014   Cataract, immature    bilateral   Dental bridge present    upper - x 2   Dental crowns present    Hypothyroidism    Personal history of radiation therapy    Prediabetes     Past Surgical History:  Procedure Laterality Date   ABDOMINOPLASTY     BALLOON SINUPLASTY     BREAST LUMPECTOMY Left 2016   BREAST LUMPECTOMY WITH RADIOACTIVE SEED AND SENTINEL LYMPH NODE BIOPSY Left 12/22/2014   Procedure: BREAST LUMPECTOMY WITH RADIOACTIVE SEED AND SENTINEL LYMPH NODE BIOPSY;  Surgeon: Stark Klein, MD;  Location: [place];  Service: General;  Laterality: Left;   BUNIONECTOMY Right    HAMMER TOE SURGERY Bilateral    RHINOPLASTY     SEPTOPLASTY      There were no vitals filed for this visit.   Subjective Assessment - 12/03/20 1012     Subjective I am not consistently able to get my heel down on the floor when I walk. Denies current.    Pertinent History frequent falls, chronic Rt LE weakness    Currently in Pain? No/denies                                [facility] Adult PT Treatment/Exercise - 12/03/20 0001       Knee/Hip Exercises: Stretches   Active Hamstring Stretch Right;1 rep;3 reps;20 seconds    Piriformis Stretch Right;3 reps;20 seconds      Knee/Hip Exercises: Standing   Hip Flexion Stengthening;Right;2 sets;10 reps    Hip Flexion Limitations tap on to top of treadmill UE support.    Gait Training 20 feet 2x with RW, hyperfocus on heel strike for more consistency.    Other Standing Knee Exercises lateral step over hurdle at edge of treadmill 2x10 on Rt   Vc to not crash foot into hurdle   Other Standing Knee Exercises weight shifting 3 ways on foam pad- verbal cues for hip alignment and stabilizing in Rt LE   1 min each direction, requires UE     Knee/Hip Exercises: Seated   Ball Squeeze 5" hold x 20    Clamshell with TheraBand Yellow   2x10 just RTLE     Ankle Exercises: Seated   Toe Raise 20 reps  PT Short Term Goals - 12/03/20 1023       PT SHORT TERM GOAL #1   Title Pt will be ind with initial HEP    Time 4    Period Weeks    Status Achieved    Target Date 12/09/20               PT Long Term Goals - 11/11/20 1159       PT LONG TERM GOAL #1   Title Pt with achieve Rt hip flexor strength of at least 4/5 for improved daily task endurance and ambulation.    Baseline --    Time 8    Period Weeks    Status New    Target Date 01/06/21      PT LONG TERM GOAL #2   Title demonstrate Rt foot clearance with gait x 40 feet without verbal cuing from PT to improve safety with gait    Baseline ---    Time 8    Status New    Target Date 01/06/21      PT LONG TERM GOAL #3   Title perform turning using rolling walker or no device with safety and minimal need for cueing by PT    Baseline ---    Time 8    Status New    Target Date 01/06/21      PT LONG TERM GOAL #4   Title Pt will be able to demo gait  in a 3 min walk test with moderate  shuffling of Rt LE to improve community function and safety    Baseline --    Period Weeks    Status New    Target Date 01/06/21      PT LONG TERM GOAL #5   Title Pt will be ind with advanced HEP    Time 8    Period Weeks    Status New    Target Date 01/06/21                   Plan - 12/03/20 1018     Clinical Impression Statement Pt arrives today pain free. Main complaint is "not being able to get my heel down when I walk consistently enough."Pt demonstrates RTLE fatigue quickly with exercises. Pt requires UE support for all standing exercises. PTA suggested pt try shorter periods of practice/exercise to decrease mental and physical fatigue. Pt agreed to try.    Personal Factors and Comorbidities Comorbidity 2    Comorbidities chronic gait abnormality, Rt LE weaknss, L3 nerve root impingement    Examination-Activity Limitations Locomotion Level;Squat;Stairs;Stand;Transfers    Examination-Participation Restrictions Cleaning;Community Activity;Meal Prep    Stability/Clinical Decision Making Evolving/Moderate complexity    Rehab Potential Good    PT Frequency 2x / week    PT Duration 8 weeks    PT Treatment/Interventions ADLs/Self Care Home Management;Cryotherapy;Traction;Electrical Stimulation;Gait training;Stair training;Functional mobility training;Therapeutic activities;Therapeutic exercise;Balance training;Neuromuscular re-education;Patient/family education;Manual techniques;Passive range of motion;Dry needling;Taping    PT Next Visit Plan Continue with Rt LE strength, stability, endurance tasks: See how pt did practicing her heel strike for shorter periods but with more intention.    PT Home Exercise Plan Access Code: [email or web address]    Consulted and Agree with Plan of Care Patient             Patient will benefit from skilled therapeutic intervention in order to improve the following deficits and impairments:  Abnormal gait, Decreased activity tolerance, Decreased  strength, Impaired flexibility, Pain, Difficulty walking,  Decreased endurance, Decreased balance  Visit Diagnosis: Other abnormalities of gait and mobility  Muscle weakness (generalized)  Pain in right leg  Other muscle spasm  Pain in right hip  Strain of right quadriceps, initial encounter     Problem List Patient Active Problem List   Diagnosis Date Noted   Arthritis of right hip 09/17/2019   Labral tear of right hip joint 08/06/2019   Gluteal tendonitis of right buttock 05/22/2019   Tear of left gluteus medius tendon 01/04/2019   Arthritis of left hip 03/20/2018   Greater trochanteric bursitis of right hip 06/13/2017   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 11/29/2016   Breast cancer of upper-outer quadrant of left female breast ([place]) 12/07/2014   Acute recurrent sinusitis 01/17/2013    Sonya Pucci, PTA 12/03/2020, 10:52 AM  [facility] [address] [place], [place], [address] Phone: [phone]   Fax:  [phone]  Name: Christina [name] MRN: [id number] Date of Birth: [birth year]

## 2020-12-06 ENCOUNTER — Other Ambulatory Visit: Payer: Self-pay

## 2020-12-06 ENCOUNTER — Encounter: Payer: Self-pay | Admitting: Physical Therapy

## 2020-12-06 ENCOUNTER — Ambulatory Visit: Payer: PPO | Admitting: Physical Therapy

## 2020-12-06 DIAGNOSIS — M62838 Other muscle spasm: Secondary | ICD-10-CM

## 2020-12-06 DIAGNOSIS — R2689 Other abnormalities of gait and mobility: Secondary | ICD-10-CM | POA: Diagnosis not present

## 2020-12-06 DIAGNOSIS — M79604 Pain in right leg: Secondary | ICD-10-CM

## 2020-12-06 DIAGNOSIS — M25551 Pain in right hip: Secondary | ICD-10-CM

## 2020-12-06 DIAGNOSIS — M6281 Muscle weakness (generalized): Secondary | ICD-10-CM

## 2020-12-06 NOTE — Therapy (Signed)
[facility] [address] [place], [place], [address] Phone: [phone]   Fax:  [phone]  Physical Therapy Treatment  Patient Details  Name: Christina [name] MRN: [id number] Date of Birth: 1950-12-08 Referring Provider (PT): Lynne Leader, MD   Encounter Date: 12/06/2020   PT End of Session - 12/06/20 1013     Visit Number 7    Date for PT Re-Evaluation 01/06/21    Authorization Type Medicare- KX needed at 15    Progress Note Due on Visit 10    PT Start Time 1012    PT Stop Time 1046    PT Time Calculation (min) 34 min    Activity Tolerance Patient limited by pain;Patient limited by fatigue;Patient tolerated treatment well    Behavior During Therapy [facility] for tasks assessed/performed             Past Medical History:  Diagnosis Date   Abrasion of right leg 12/17/2014   Adrenal insufficiency ([place])    Breast cancer of upper-outer quadrant of left female breast ([place]) 12/07/2014   Cataract, immature    bilateral   Dental bridge present    upper - x 2   Dental crowns present    Hypothyroidism    Personal history of radiation therapy    Prediabetes     Past Surgical History:  Procedure Laterality Date   ABDOMINOPLASTY     BALLOON SINUPLASTY     BREAST LUMPECTOMY Left 2016   BREAST LUMPECTOMY WITH RADIOACTIVE SEED AND SENTINEL LYMPH NODE BIOPSY Left 12/22/2014   Procedure: BREAST LUMPECTOMY WITH RADIOACTIVE SEED AND SENTINEL LYMPH NODE BIOPSY;  Surgeon: Stark Klein, MD;  Location: [place];  Service: General;  Laterality: Left;   BUNIONECTOMY Right    HAMMER TOE SURGERY Bilateral    RHINOPLASTY     SEPTOPLASTY      There were no vitals filed for this visit.                      [place] Adult PT Treatment/Exercise - 12/06/20 0001       Knee/Hip Exercises: Stretches   Other Knee/Hip Stretches LT knee to chest with intention on straightening out the RTLE 20 sec 2x, VC to  relax      Knee/Hip Exercises: Aerobic   Nustep L1 x 5 min PTA present      Knee/Hip Exercises: Supine   Short Arc Quad Sets Strengthening;Right;2 sets;10 reps    Short Arc Quad Sets Limitations Added 1# wt to second [employer] --   2X5 3 SEC hold   Knee Flexion Strengthening;AROM;Right;1 set;10 reps   2x5   Knee Flexion Limitations Floor slider    Other Supine Knee/Hip Exercises SAQ to SLR 5x 0#      Manual Therapy   Soft tissue mobilization RT hip flexors/RF soft tissue                    PT Education - 12/06/20 1046     Education Details HEP.    Person(s) Educated Patient    Methods Explanation;Demonstration;Verbal cues;Handout    Comprehension Returned demonstration;Verbalized understanding              PT Short Term Goals - 12/06/20 1023       PT SHORT TERM GOAL #3   Title Pt will be able to perform step through gait with consistent heel strike on Rt LE x 25 feet  using walker    Time 4    Period Weeks    Status On-going    Target Date 12/09/20               PT Long Term Goals - 11/11/20 1159       PT LONG TERM GOAL #1   Title Pt with achieve Rt hip flexor strength of at least 4/5 for improved daily task endurance and ambulation.    Baseline --    Time 8    Period Weeks    Status New    Target Date 01/06/21      PT LONG TERM GOAL #2   Title demonstrate Rt foot clearance with gait x 40 feet without verbal cuing from PT to improve safety with gait    Baseline ---    Time 8    Status New    Target Date 01/06/21      PT LONG TERM GOAL #3   Title perform turning using rolling walker or no device with safety and minimal need for cueing by PT    Baseline ---    Time 8    Status New    Target Date 01/06/21      PT LONG TERM GOAL #4   Title Pt will be able to demo gait  in a 3 min walk test with moderate shuffling of Rt LE to improve community function and safety    Baseline --    Period Weeks    Status New    Target Date 01/06/21       PT LONG TERM GOAL #5   Title Pt will be ind with advanced HEP    Time 8    Period Weeks    Status New    Target Date 01/06/21                   Plan - 12/06/20 1014     Clinical Impression Statement Pt reports she was "so sore after Friday." She reports her RT quad is hurting "a lot today." Resting tone of Rt RF was increased, manual work appeared some helpful. Mat exercises were well tolerated with low reps and at least 1 min rest in between sets. PTA educated pt in ways to gently stretch her Rt RF. Pt also correctly demonstrated.    Personal Factors and Comorbidities Comorbidity 2    Comorbidities chronic gait abnormality, Rt LE weaknss, L3 nerve root impingement    Examination-Activity Limitations Locomotion Level;Squat;Stairs;Stand;Transfers    Examination-Participation Restrictions Cleaning;Community Activity;Meal Prep    Stability/Clinical Decision Making Evolving/Moderate complexity    Rehab Potential Good    PT Frequency 2x / week    PT Duration 8 weeks    PT Treatment/Interventions ADLs/Self Care Home Management;Cryotherapy;Traction;Electrical Stimulation;Gait training;Stair training;Functional mobility training;Therapeutic activities;Therapeutic exercise;Balance training;Neuromuscular re-education;Patient/family education;Manual techniques;Passive range of motion;Dry needling;Taping    PT Next Visit Plan Mat exercises for quad strength, review stretches, see if pt can do SLR 10x consec for STG.    PT Home Exercise Plan Access Code: [email or web address]    Consulted and Agree with Plan of Care Patient             Patient will benefit from skilled therapeutic intervention in order to improve the following deficits and impairments:  Abnormal gait, Decreased activity tolerance, Decreased strength, Impaired flexibility, Pain, Difficulty walking, Decreased endurance, Decreased balance  Visit Diagnosis: Other abnormalities of gait and mobility  Muscle weakness  (generalized)  Pain in right leg  Other muscle spasm  Pain in right hip     Problem List Patient Active Problem List   Diagnosis Date Noted   Arthritis of right hip 09/17/2019   Labral tear of right hip joint 08/06/2019   Gluteal tendonitis of right buttock 05/22/2019   Tear of left gluteus medius tendon 01/04/2019   Arthritis of left hip 03/20/2018   Greater trochanteric bursitis of right hip 06/13/2017   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 11/29/2016   Breast cancer of upper-outer quadrant of left female breast ([place]) 12/07/2014   Acute recurrent sinusitis 01/17/2013    Dayjah Selman, PTA 12/06/2020, 10:50 AM  [facility] [address] [place], [place], [address] Phone: [phone]   Fax:  [phone]  Name: Christina [name] MRN: [id number] Date of Birth: 11-27-1950   Access Code: [email or web address]: [email or web address]: 08/08/2022Prepared by: Anderson Malta CochranExercises  Standing March with Counter Support - 2 x daily - 7 x weekly - 3 sets - 5 reps  Seated March - 2 x daily - 7 x weekly - 3 sets - 5 reps  Seated Hamstring Stretch - 2 x daily - 7 x weekly - 1 sets - 3 reps - 20-30 hold  Supine Active Straight Leg Raise - 2 x daily - 7 x weekly - 2 sets - 5 reps  Prone Knee Flexion AAROM with Overpressure - 1 x daily - 7 x weekly - 1 sets - 3 reps - 10 hold  Seated Figure 4 Piriformis Stretch - 2 x daily - 7 x weekly - 1 sets - 3 reps - 20 hold  Sit to Stand Without Arm Support - 2 x daily - 7 x weekly - 1 sets - 10 reps  Standing Weight Shift - 1 x daily - 7 x weekly - 2 sets - 10 reps  Stride Stance Weight Shift - 1 x daily - 7 x weekly - 2 sets - 10 reps  Supine Dynamic Modified Thomas Quad and Hip Flexor Dynamic Stretch - 2 x daily - 7 x weekly - 2 sets - 20 hold

## 2020-12-09 ENCOUNTER — Other Ambulatory Visit: Payer: Self-pay

## 2020-12-09 ENCOUNTER — Ambulatory Visit: Payer: PPO

## 2020-12-09 DIAGNOSIS — M6281 Muscle weakness (generalized): Secondary | ICD-10-CM

## 2020-12-09 DIAGNOSIS — R2689 Other abnormalities of gait and mobility: Secondary | ICD-10-CM

## 2020-12-09 DIAGNOSIS — M79604 Pain in right leg: Secondary | ICD-10-CM

## 2020-12-09 DIAGNOSIS — M62838 Other muscle spasm: Secondary | ICD-10-CM

## 2020-12-09 NOTE — Progress Notes (Signed)
I, Wendy Poet, LAT, ATC, am serving as scribe for Dr. Lynne Leader.  Christina [name] is a 70 y.o. female who presents to [place] [facility] today for f/u of R leg/thigh weakness and balance problems/frequent falls.  Pt was last seen by Dr. Georgina Snell on 11/10/20 and noted overall improvement.  She was referred to PT and has completed 8 sessions.  She was also referred for a lumbar ESI (R L3) that she had on 11/12/20.  Today, pt reports that she is feeling better in terms of her walking and balance but is still having significant issues.  She states that she can move around her apartment better w/o  the walker but does better in public w/ her walker.  She con't to have care daily x 4 hours, 7 days/week.   She struggles with her rolling walker going down a steep hill path to the car.  Her aide needs to restrain her physically so that she does not accelerate too quickly and fall.  Her rolling walker does not have brakes.  Diagnostic testing: C-spine and brain MRI- 08/25/20; CT head and c-spine w/o contrast- 08/13/20; L-spine MRI- 03/06/20; L-spine XR- 02/25/20   Pertinent review of systems: No fevers or chills  Relevant historical information: History of right leg weakness thought to be secondary to nerve injury right L3 nerve root.   Exam:  BP (!) 150/84 (BP Location: Right Arm, Patient Position: Sitting, Cuff Size: Normal)   Pulse 93   Ht '5\' 3"'$  (1.6 m)   Wt 120 lb (54.4 kg)   SpO2 95%   BMI 21.26 kg/m  General: Well Developed, well nourished, and in no acute distress.   MSK: Slow but slightly unstable gait using a walker.    Lab and Radiology Results DG INJECT DIAG/THERA/INC NEEDLE/CATH/PLC EPI/LUMB/SAC W/IMG  Result Date: 11/12/2020 CLINICAL DATA:  Right leg weakness and pain EXAM: SELECTIVE NERVE ROOT BLOCK AND TRANSFORAMINAL EPIDURAL STEROID INJECTION UNDER FLUOROSCOPY FLUOROSCOPY TIME:  23 seconds; 13 uGym2 DAP TECHNIQUE: An appropriate skin entry site was  determined under fluoroscopy. Operator donned sterile gloves and mask. Site was marked, prepped with Betadine, draped in usual sterile fashion, infiltrated locally with 1% lidocaine. A 22 gauge spinal needle was advanced to the superior ventral margin of the right L3-4 neural foramen. Diagnostic injection of 2 ml Omnipaque 180 showed partial outlining of the exiting nerve root as well as epidural extension of contrast, with no intravascular or subarachnoid component. 80 mg Depo-Medrol in 3 ml lidocaine 1% was administered. The patient tolerated procedure well, with no immediate complication. IMPRESSION: 1. Technically successful right L3 selective nerve root block and transforaminal epidural steroid injection Electronically Signed   By: Lucrezia Europe M.D.   On: 11/12/2020 12:03   I, Lynne Leader, personally (independently) visualized and performed the interpretation of the images attached in this note.  '    Assessment and Plan: 70 y.o. female slow recovery from a head injury and concussion.  The concussion has effectively resolved at this point.  Her main issue is difficulty with balance and gait and fall risk.  This is certainly multifactorial.  She very likely does have some residual balance problems with her concussion but I think a lot of her problem is right quad weakness from her neurological L3 lumbar problem.  This fundamentally causes a gait instability problem where she cannot catch her legs up fast enough if she gets her walker too far in front of her.  This especially  will cause a problem if she is going down a hill which is a persistent daily problem for her.  We did some problem-solving and may need to adjust the type of walker that she is using the use of walker with intrinsic breaking.  She will talk to a medical supply company and let me know what she needs.  Continue home exercise program.  Discussed safety at home and fall risk at home.  Recheck with me in 3 months.    Discussed  warning signs or symptoms. Please see discharge instructions. Patient expresses understanding.   The above documentation has been reviewed and is accurate and complete Lynne Leader, M.D. Total encounter time 30 minutes including face-to-face time with the patient and, reviewing past medical record, and charting on the date of service.   Treatment plan options and fall risk

## 2020-12-09 NOTE — Therapy (Signed)
[facility] [address], [place], [place], [address] Phone: [phone]   Fax:  [phone]  Physical Therapy Treatment  Patient Details  Name: Christina [name] MRN: [id number] Date of Birth: [birth year] Referring Provider (PT): Lynne Leader, MD   Encounter Date: 12/09/2020   PT End of Session - 12/09/20 1053     Visit Number 8    Date for PT Re-Evaluation 01/06/21    Authorization Type Medicare- KX needed at 15    Progress Note Due on Visit 10    PT Start Time 1018    PT Stop Time 1053    PT Time Calculation (min) 35 min    Activity Tolerance Patient limited by fatigue;Patient tolerated treatment well    Behavior During Therapy [facility] for tasks assessed/performed             Past Medical History:  Diagnosis Date   Abrasion of right leg 12/17/2014   Adrenal insufficiency ([place])    Breast cancer of upper-outer quadrant of left female breast ([place]) 12/07/2014   Cataract, immature    bilateral   Dental bridge present    upper - x 2   Dental crowns present    Hypothyroidism    Personal history of radiation therapy    Prediabetes     Past Surgical History:  Procedure Laterality Date   ABDOMINOPLASTY     BALLOON SINUPLASTY     BREAST LUMPECTOMY Left 2016   BREAST LUMPECTOMY WITH RADIOACTIVE SEED AND SENTINEL LYMPH NODE BIOPSY Left 12/22/2014   Procedure: BREAST LUMPECTOMY WITH RADIOACTIVE SEED AND SENTINEL LYMPH NODE BIOPSY;  Surgeon: Stark Klein, MD;  Location: [place];  Service: General;  Laterality: Left;   BUNIONECTOMY Right    HAMMER TOE SURGERY Bilateral    RHINOPLASTY     SEPTOPLASTY      There were no vitals filed for this visit.   Subjective Assessment - 12/09/20 1019     Subjective I was a little sore after last session but not bad.  I slept good last night.    Currently in Pain? No/denies                               [facility] Adult PT Treatment/Exercise -  12/09/20 0001       Knee/Hip Exercises: Stretches   Active Hamstring Stretch Right;1 rep;3 reps;20 seconds      Knee/Hip Exercises: Standing   Hip Flexion Stengthening;Right;5 sets;3 sets    Hip Flexion Limitations tap on to top of treadmill UE support.    Forward Step Up Right;2 sets;5 sets;Hand Hold: 1;Step Height: 4"      Knee/Hip Exercises: Seated   Long Arc Quad Strengthening;Left;2 sets;10 reps    [facility] 5" hold x 20    Clamshell with TheraBand Yellow   2x10 just RTLE   Hamstring Curl Strengthening;Right;2 sets;10 reps    Hamstring Limitations red band      Knee/Hip Exercises: Supine   Short Arc Quad Sets Strengthening;Right;2 sets;10 reps    Short Arc Quad Sets Limitations 1# added    Straight Leg Raises Strengthening;Right;10 reps    Straight Leg Raises Limitations able to do without lag    Knee Flexion Strengthening;AROM;Right;1 set;10 reps   2x5   Knee Flexion Limitations Floor slider    Other Supine Knee/Hip Exercises ab bracing with marching 2x5 bil each  PT Short Term Goals - 12/09/20 1037       PT SHORT TERM GOAL #2   Title Pt will be able to perform 10 reps of SLR on Rt LE wihtout quad lag to demo improved Rt hip flexor strength/endurance    Status Achieved      PT SHORT TERM GOAL #3   Status On-going               PT Long Term Goals - 11/11/20 1159       PT LONG TERM GOAL #1   Title Pt with achieve Rt hip flexor strength of at least 4/5 for improved daily task endurance and ambulation.    Baseline --    Time 8    Period Weeks    Status New    Target Date 01/06/21      PT LONG TERM GOAL #2   Title demonstrate Rt foot clearance with gait x 40 feet without verbal cuing from PT to improve safety with gait    Baseline ---    Time 8    Status New    Target Date 01/06/21      PT LONG TERM GOAL #3   Title perform turning using rolling walker or no device with safety and minimal need for cueing by PT     Baseline ---    Time 8    Status New    Target Date 01/06/21      PT LONG TERM GOAL #4   Title Pt will be able to demo gait  in a 3 min walk test with moderate shuffling of Rt LE to improve community function and safety    Baseline --    Period Weeks    Status New    Target Date 01/06/21      PT LONG TERM GOAL #5   Title Pt will be ind with advanced HEP    Time 8    Period Weeks    Status New    Target Date 01/06/21                   Plan - 12/09/20 1034     Clinical Impression Statement Pt reports that she is feeling good after last session, however her balance is very challenged today.  Session focused on Rt LE strength, stability and endurance to improve endurance and safety.  Gait continues to be challenged due to limited control and endurance with progression of the Rt LE to due hip flexor weakness.  Pt was able to perform SLR on the Rt x 10 without lag, meeting STG.  Pt requires frequent verbal cues for hip flexion to clear the object that she is tapping on/stepping up onto.  Pt fatigues easily and requires frequent rest breaks.  Pt will continue to benefit from skilled PT to address Rt LE strength and endurance to improve safety.    PT Frequency 2x / week    PT Duration 8 weeks    PT Treatment/Interventions ADLs/Self Care Home Management;Cryotherapy;Traction;Electrical Stimulation;Gait training;Stair training;Functional mobility training;Therapeutic activities;Therapeutic exercise;Balance training;Neuromuscular re-education;Patient/family education;Manual techniques;Passive range of motion;Dry needling;Taping    PT Next Visit Plan Mat exercises for quad strength, review stretches    PT Home Exercise Plan Access Code: [email or web address]    Consulted and Agree with Plan of Care Patient             Patient will benefit from skilled therapeutic intervention in order to improve the following deficits and impairments:  Abnormal  gait, Decreased activity tolerance, Decreased  strength, Impaired flexibility, Pain, Difficulty walking, Decreased endurance, Decreased balance  Visit Diagnosis: Other abnormalities of gait and mobility  Muscle weakness (generalized)  Pain in right leg  Other muscle spasm     Problem List Patient Active Problem List   Diagnosis Date Noted   Arthritis of right hip 09/17/2019   Labral tear of right hip joint 08/06/2019   Gluteal tendonitis of right buttock 05/22/2019   Tear of left gluteus medius tendon 01/04/2019   Arthritis of left hip 03/20/2018   Greater trochanteric bursitis of right hip 06/13/2017   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 11/29/2016   Breast cancer of upper-outer quadrant of left female breast ([place]) 12/07/2014   Acute recurrent sinusitis 01/17/2013   Sigurd Sos, PT 12/09/20 10:55 AM   [facility] [address], [place], [place], [address] Phone: [phone]   Fax:  [phone]  Name: Christina [name] MRN: [id number] Date of Birth: 1950-09-14

## 2020-12-10 ENCOUNTER — Encounter: Payer: Self-pay | Admitting: Family Medicine

## 2020-12-10 ENCOUNTER — Ambulatory Visit (INDEPENDENT_AMBULATORY_CARE_PROVIDER_SITE_OTHER): Payer: PPO | Admitting: Family Medicine

## 2020-12-10 VITALS — BP 150/84 | HR 93 | Ht 63.0 in | Wt 120.0 lb

## 2020-12-10 DIAGNOSIS — S060X0D Concussion without loss of consciousness, subsequent encounter: Secondary | ICD-10-CM

## 2020-12-10 DIAGNOSIS — S3421XD Injury of nerve root of lumbar spine, subsequent encounter: Secondary | ICD-10-CM | POA: Diagnosis not present

## 2020-12-10 DIAGNOSIS — R269 Unspecified abnormalities of gait and mobility: Secondary | ICD-10-CM

## 2020-12-10 NOTE — Patient Instructions (Signed)
Thank you for coming in today.   Continue home exercises.   Consider a regular walker for the hill.   Recheck with me in 3 months.   Let me know if you need anything.

## 2020-12-13 ENCOUNTER — Other Ambulatory Visit: Payer: Self-pay

## 2020-12-13 ENCOUNTER — Ambulatory Visit: Payer: PPO | Admitting: Physical Therapy

## 2020-12-13 DIAGNOSIS — R2689 Other abnormalities of gait and mobility: Secondary | ICD-10-CM | POA: Diagnosis not present

## 2020-12-13 DIAGNOSIS — M79604 Pain in right leg: Secondary | ICD-10-CM

## 2020-12-13 DIAGNOSIS — M6281 Muscle weakness (generalized): Secondary | ICD-10-CM

## 2020-12-13 DIAGNOSIS — M62838 Other muscle spasm: Secondary | ICD-10-CM

## 2020-12-13 NOTE — Therapy (Signed)
[facility] [address], [place], [place], [address] Phone: [phone]   Fax:  [phone]  Physical Therapy Treatment  Patient Details  Name: Christina [name] MRN: [id number] Date of Birth: [birth year] Referring Provider (PT): Lynne Leader, MD   Encounter Date: 12/13/2020   PT End of Session - 12/13/20 1016     Visit Number 9    Date for PT Re-Evaluation 01/06/21    Authorization Type Medicare- KX needed at 15    Progress Note Due on Visit 10    PT Start Time 1016    PT Stop Time 1053    PT Time Calculation (min) 37 min    Activity Tolerance Patient tolerated treatment well    Behavior During Therapy [facility] for tasks assessed/performed             Past Medical History:  Diagnosis Date   Abrasion of right leg 12/17/2014   Adrenal insufficiency ([place])    Breast cancer of upper-outer quadrant of left female breast ([place]) 12/07/2014   Cataract, immature    bilateral   Dental bridge present    upper - x 2   Dental crowns present    Hypothyroidism    Personal history of radiation therapy    Prediabetes     Past Surgical History:  Procedure Laterality Date   ABDOMINOPLASTY     BALLOON SINUPLASTY     BREAST LUMPECTOMY Left 2016   BREAST LUMPECTOMY WITH RADIOACTIVE SEED AND SENTINEL LYMPH NODE BIOPSY Left 12/22/2014   Procedure: BREAST LUMPECTOMY WITH RADIOACTIVE SEED AND SENTINEL LYMPH NODE BIOPSY;  Surgeon: Stark Klein, MD;  Location: [place];  Service: General;  Laterality: Left;   BUNIONECTOMY Right    HAMMER TOE SURGERY Bilateral    RHINOPLASTY     SEPTOPLASTY      There were no vitals filed for this visit.   Subjective Assessment - 12/13/20 1019     Subjective I felt much better after last session. I was not sore.    Pertinent History frequent falls, chronic Rt LE weakness    Currently in Pain? No/denies                               [facility] Adult PT  Treatment/Exercise - 12/13/20 0001       Knee/Hip Exercises: Stretches   Other Knee/Hip Stretches LT knee to chest with intention on straightening out the RTLE 20 sec 2x, VC to relax   VC to get RTLE straight     Knee/Hip Exercises: Aerobic   Nustep L2 x 6 min with {PTA      Knee/Hip Exercises: Seated   Clamshell with TheraBand Red   2x10   Hamstring Curl --   2x10, red band then green band   Sit to Sand --   2x5 off black pad     Knee/Hip Exercises: Supine   Short Arc Quad Sets Strengthening;Right;2 sets;10 reps    Short Arc Quad Sets Limitations 1#, second set 1.5#    Straight Leg Raises --   2x10 1#     Manual Therapy   Soft tissue mobilization RT proximal RF TP release                      PT Short Term Goals - 12/09/20 1037       PT SHORT TERM GOAL #2   Title Pt will  be able to perform 10 reps of SLR on Rt LE wihtout quad lag to demo improved Rt hip flexor strength/endurance    Status Achieved      PT SHORT TERM GOAL #3   Status On-going               PT Long Term Goals - 11/11/20 1159       PT LONG TERM GOAL #1   Title Pt with achieve Rt hip flexor strength of at least 4/5 for improved daily task endurance and ambulation.    Baseline --    Time 8    Period Weeks    Status New    Target Date 01/06/21      PT LONG TERM GOAL #2   Title demonstrate Rt foot clearance with gait x 40 feet without verbal cuing from PT to improve safety with gait    Baseline ---    Time 8    Status New    Target Date 01/06/21      PT LONG TERM GOAL #3   Title perform turning using rolling walker or no device with safety and minimal need for cueing by PT    Baseline ---    Time 8    Status New    Target Date 01/06/21      PT LONG TERM GOAL #4   Title Pt will be able to demo gait  in a 3 min walk test with moderate shuffling of Rt LE to improve community function and safety    Baseline --    Period Weeks    Status New    Target Date 01/06/21      PT LONG  TERM GOAL #5   Title Pt will be ind with advanced HEP    Time 8    Period Weeks    Status New    Target Date 01/06/21                   Plan - 12/13/20 1047     Clinical Impression Statement Pt was able to increase resistance for RTLE exercises today. Pt will work on increasing her time on her Nutep at home by 1 min each time she performs. Today she was able to complete 6 min. Pt continues to be inconsistent with her RT heel strike.    Personal Factors and Comorbidities Comorbidity 2    Comorbidities chronic gait abnormality, Rt LE weaknss, L3 nerve root impingement    Examination-Activity Limitations Locomotion Level;Squat;Stairs;Stand;Transfers    Examination-Participation Restrictions Cleaning;Community Activity;Meal Prep    Stability/Clinical Decision Making Evolving/Moderate complexity    Rehab Potential Good    PT Frequency 2x / week    PT Treatment/Interventions ADLs/Self Care Home Management;Cryotherapy;Traction;Electrical Stimulation;Gait training;Stair training;Functional mobility training;Therapeutic activities;Therapeutic exercise;Balance training;Neuromuscular re-education;Patient/family education;Manual techniques;Passive range of motion;Dry needling;Taping    PT Next Visit Plan Mat exercises for quad strength, Nustep, TP work if needed, 10th V PN    PT Home Exercise Plan Access Code: [email or web address]    Consulted and Agree with Plan of Care Patient             Patient will benefit from skilled therapeutic intervention in order to improve the following deficits and impairments:  Abnormal gait, Decreased activity tolerance, Decreased strength, Impaired flexibility, Pain, Difficulty walking, Decreased endurance, Decreased balance  Visit Diagnosis: Other abnormalities of gait and mobility  Muscle weakness (generalized)  Pain in right leg  Other muscle spasm     Problem List Patient  Active Problem List   Diagnosis Date Noted   Arthritis of right hip  09/17/2019   Labral tear of right hip joint 08/06/2019   Gluteal tendonitis of right buttock 05/22/2019   Tear of left gluteus medius tendon 01/04/2019   Arthritis of left hip 03/20/2018   Greater trochanteric bursitis of right hip 06/13/2017   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 11/29/2016   Breast cancer of upper-outer quadrant of left female breast ([place]) 12/07/2014   Acute recurrent sinusitis 01/17/2013    Dietrick Barris, PTA 12/13/2020, 10:54 AM  [facility] [address] [place], [place], [address] Phone: [phone]   Fax:  [phone]  Name: Christina [name] MRN: [id number] Date of Birth: 1950/10/26

## 2020-12-14 ENCOUNTER — Other Ambulatory Visit: Payer: Self-pay | Admitting: General Surgery

## 2020-12-14 DIAGNOSIS — Z853 Personal history of malignant neoplasm of breast: Secondary | ICD-10-CM

## 2020-12-17 ENCOUNTER — Other Ambulatory Visit: Payer: Self-pay

## 2020-12-17 ENCOUNTER — Ambulatory Visit
Admission: RE | Admit: 2020-12-17 | Discharge: 2020-12-17 | Disposition: A | Discharge status: Home / Self Care | Payer: PPO | Source: Ambulatory Visit | Attending: General Surgery | Admitting: General Surgery

## 2020-12-17 DIAGNOSIS — R922 Inconclusive mammogram: Secondary | ICD-10-CM | POA: Diagnosis not present

## 2020-12-17 DIAGNOSIS — Z853 Personal history of malignant neoplasm of breast: Secondary | ICD-10-CM

## 2020-12-24 ENCOUNTER — Encounter: Payer: Self-pay | Admitting: Physical Therapy

## 2020-12-24 ENCOUNTER — Other Ambulatory Visit: Payer: Self-pay

## 2020-12-24 ENCOUNTER — Ambulatory Visit: Payer: PPO | Admitting: Physical Therapy

## 2020-12-24 DIAGNOSIS — M25551 Pain in right hip: Secondary | ICD-10-CM

## 2020-12-24 DIAGNOSIS — M62838 Other muscle spasm: Secondary | ICD-10-CM

## 2020-12-24 DIAGNOSIS — M6281 Muscle weakness (generalized): Secondary | ICD-10-CM

## 2020-12-24 DIAGNOSIS — R2689 Other abnormalities of gait and mobility: Secondary | ICD-10-CM | POA: Diagnosis not present

## 2020-12-24 DIAGNOSIS — S76111A Strain of right quadriceps muscle, fascia and tendon, initial encounter: Secondary | ICD-10-CM

## 2020-12-24 DIAGNOSIS — M79604 Pain in right leg: Secondary | ICD-10-CM

## 2020-12-24 NOTE — Therapy (Addendum)
[facility] [address] [place], [place], [address] Phone: [phone]   Fax:  [phone]  Physical Therapy Treatment  Patient Details  Name: Christina [name] MRN: [id number] Date of Birth: 07/30/1950 Referring Provider (PT): Lynne Leader, MD   Encounter Date: 12/24/2020 Progress Note Reporting Period 11/11/20 to 12/24/20  See note below for Objective Data and Assessment of Progress/Goals.    Sigurd Sos, PT 12/24/20 12:08 PM   PT End of Session - 12/24/20 1016     Visit Number 10    Date for PT Re-Evaluation 01/06/21    Authorization Type Medicare- KX needed at 15    Progress Note Due on Visit 20    PT Start Time 1015    PT Stop Time 1053    PT Time Calculation (min) 38 min    Activity Tolerance Patient tolerated treatment well    Behavior During Therapy [facility] for tasks assessed/performed             Past Medical History:  Diagnosis Date   Abrasion of right leg 12/17/2014   Adrenal insufficiency ([facility])    Breast cancer of upper-outer quadrant of left female breast ([place]) 12/07/2014   Cataract, immature    bilateral   Dental bridge present    upper - x 2   Dental crowns present    Hypothyroidism    Personal history of radiation therapy    Prediabetes     Past Surgical History:  Procedure Laterality Date   ABDOMINOPLASTY     BALLOON SINUPLASTY     BREAST LUMPECTOMY Left 2016   BREAST LUMPECTOMY WITH RADIOACTIVE SEED AND SENTINEL LYMPH NODE BIOPSY Left 12/22/2014   Procedure: BREAST LUMPECTOMY WITH RADIOACTIVE SEED AND SENTINEL LYMPH NODE BIOPSY;  Surgeon: Stark Klein, MD;  Location: [place];  Service: General;  Laterality: Left;   BUNIONECTOMY Right    HAMMER TOE SURGERY Bilateral    RHINOPLASTY     SEPTOPLASTY      There were no vitals filed for this visit.   Subjective Assessment - 12/24/20 1016     Subjective I fell in the kitchen, my shoe/foot got stuck on the floor.     Pertinent History frequent falls, chronic Rt LE weakness    Currently in Pain? No/denies                [facility] PT Assessment - 12/24/20 0001       Assessment   Medical Diagnosis Rt thigh pain, injury of spinal nerve root at L3 level, weakness of Rt hip    Referring Provider (PT) Lynne Leader, MD      Strength   Right Hip Flexion 4-/5    Right Hip ABduction 4+/5                           [facility] Adult PT Treatment/Exercise - 12/24/20 0001       Knee/Hip Exercises: Aerobic   Nustep L2 x 6 min with {[name]      Knee/Hip Exercises: Supine   Short Arc Quad Sets Strengthening;Right;2 sets;10 reps    Short Arc Quad Sets Limitations 1.5#    Bridges Strengthening;Both;1 set;5 reps    Straight Leg Raises Limitations 0#10x, 1.5# 10x    Other Supine Knee/Hip Exercises Marching with 0# 5x, added 1.5# did LT and RT 3x 3 lifts each  PT Short Term Goals - 12/24/20 1023       PT SHORT TERM GOAL #3   Title Pt will be able to perform step through gait with consistent heel strike on Rt LE x 25 feet using walker    Time 4    Period Weeks    Status On-going    Target Date 12/09/20               PT Long Term Goals - 12/24/20 1023       PT LONG TERM GOAL #2   Title demonstrate Rt foot clearance with gait x 40 feet without verbal cuing from PT to improve safety with gait    Time 8    Period Weeks    Status On-going      PT LONG TERM GOAL #3   Title perform turning using rolling walker or no device with safety and minimal need for cueing by PT    Time 8    Period Weeks    Status On-going                   Plan - 12/24/20 1037     Clinical Impression Statement Pt arrives pain free. Pt reports having a fall in her kitchen yesterday. RT hip flexor MMT same as last measurement. Pt sill inconsistent with heel strike and advancing RTLE during ambualtion. pt was able to perform supine hip flexion today with 1.5# on ankle, knee  bent.    Personal Factors and Comorbidities Comorbidity 2    Comorbidities chronic gait abnormality, Rt LE weaknss, L3 nerve root impingement    Stability/Clinical Decision Making Evolving/Moderate complexity    Rehab Potential Good    PT Frequency 2x / week    PT Duration 8 weeks    PT Treatment/Interventions ADLs/Self Care Home Management;Cryotherapy;Traction;Electrical Stimulation;Gait training;Stair training;Functional mobility training;Therapeutic activities;Therapeutic exercise;Balance training;Neuromuscular re-education;Patient/family education;Manual techniques;Passive range of motion;Dry needling;Taping    PT Next Visit Plan 10th V PN    PT Home Exercise Plan Access Code: [email or web address]    Consulted and Agree with Plan of Care Patient             Patient will benefit from skilled therapeutic intervention in order to improve the following deficits and impairments:     Visit Diagnosis: Muscle weakness (generalized)  Other abnormalities of gait and mobility  Pain in right leg  Other muscle spasm  Pain in right hip  Strain of right quadriceps, initial encounter     Problem List Patient Active Problem List   Diagnosis Date Noted   Arthritis of right hip 09/17/2019   Labral tear of right hip joint 08/06/2019   Gluteal tendonitis of right buttock 05/22/2019   Tear of left gluteus medius tendon 01/04/2019   Arthritis of left hip 03/20/2018   Greater trochanteric bursitis of right hip 06/13/2017   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 11/29/2016   Breast cancer of upper-outer quadrant of left female breast ([place]) 12/07/2014   Acute recurrent sinusitis 01/17/2013   Christina [name]  12/24/20 12:08 PM  12/24/2020, 12:08 PM  [facility] [address] [place], [place], [address] Phone: [phone]   Fax:  [phone]  Name: Christina [name] MRN: [id number] Date of Birth: February 21, 1951

## 2020-12-27 ENCOUNTER — Other Ambulatory Visit: Payer: Self-pay

## 2020-12-27 ENCOUNTER — Encounter: Payer: Self-pay | Admitting: Physical Therapy

## 2020-12-27 ENCOUNTER — Ambulatory Visit: Payer: PPO | Admitting: Physical Therapy

## 2020-12-27 DIAGNOSIS — M6281 Muscle weakness (generalized): Secondary | ICD-10-CM

## 2020-12-27 DIAGNOSIS — R2689 Other abnormalities of gait and mobility: Secondary | ICD-10-CM

## 2020-12-27 DIAGNOSIS — M62838 Other muscle spasm: Secondary | ICD-10-CM

## 2020-12-27 DIAGNOSIS — M25551 Pain in right hip: Secondary | ICD-10-CM

## 2020-12-27 DIAGNOSIS — M79604 Pain in right leg: Secondary | ICD-10-CM

## 2020-12-27 NOTE — Therapy (Signed)
[facility] [address], [place], [place], [address] Phone: [phone]   Fax:  [phone]  Physical Therapy Treatment  Patient Details  Name: Christina [name] MRN: [id number] Date of Birth: August 20, 1950 Referring Provider (PT): Lynne Leader, MD   Encounter Date: 12/27/2020   PT End of Session - 12/27/20 1019     Visit Number 11    Authorization Type Medicare- KX needed at 15    Progress Note Due on Visit 20    PT Start Time 1016    PT Stop Time 1049    PT Time Calculation (min) 33 min    Activity Tolerance Patient tolerated treatment well;Patient limited by fatigue;Treatment limited secondary to medical complications (Comment)    Behavior During Therapy [facility] for tasks assessed/performed             Past Medical History:  Diagnosis Date   Abrasion of right leg 12/17/2014   Adrenal insufficiency ([place])    Breast cancer of upper-outer quadrant of left female breast ([place]) 12/07/2014   Cataract, immature    bilateral   Dental bridge present    upper - x 2   Dental crowns present    Hypothyroidism    Personal history of radiation therapy    Prediabetes     Past Surgical History:  Procedure Laterality Date   ABDOMINOPLASTY     BALLOON SINUPLASTY     BREAST LUMPECTOMY Left 2016   BREAST LUMPECTOMY WITH RADIOACTIVE SEED AND SENTINEL LYMPH NODE BIOPSY Left 12/22/2014   Procedure: BREAST LUMPECTOMY WITH RADIOACTIVE SEED AND SENTINEL LYMPH NODE BIOPSY;  Surgeon: Stark Klein, MD;  Location: [place];  Service: General;  Laterality: Left;   BUNIONECTOMY Right    HAMMER TOE SURGERY Bilateral    RHINOPLASTY     SEPTOPLASTY      There were no vitals filed for this visit.   Subjective Assessment - 12/27/20 1020     Subjective I fell again on Sunday. I am not scheduled to see my MD any time soon. I did well after my exercises on Friday, no adverse effects reported.    Pertinent History frequent  falls, chronic Rt LE weakness    Diagnostic tests 2018 multi-level disc dessication and lateral stenosis, right foraminal disc extrusion L3/4 with right L3 nerve root impingement    Currently in Pain? No/denies   Neck muscles a little sore from falling   Multiple Pain Sites No                               [facility] Adult PT Treatment/Exercise - 12/27/20 0001       Knee/Hip Exercises: Aerobic   Nustep L1 x 7 min concurrent discusion of current status      Knee/Hip Exercises: Seated   Clamshell with TheraBand --   Green loop in supine: 10x     Knee/Hip Exercises: Supine   Short Arc Quad Sets Strengthening;Right;2 sets;10 reps    Short Arc Quad Sets Limitations 1.5#, then 2#    Bridges Strengthening;Both;2 sets;10 reps    Bridges Limitations VC to contract Rt glute more    Straight Leg Raises Limitations 1.5# 2x10    Other Supine Knee/Hip Exercises Marching Bil (alt) 1.5# on RT ankle 2x5                      PT Short Term Goals - 12/24/20 1023  PT SHORT TERM GOAL #3   Title Pt will be able to perform step through gait with consistent heel strike on Rt LE x 25 feet using walker    Time 4    Period Weeks    Status On-going    Target Date 12/09/20               PT Long Term Goals - 12/24/20 1023       PT LONG TERM GOAL #2   Title demonstrate Rt foot clearance with gait x 40 feet without verbal cuing from PT to improve safety with gait    Time 8    Period Weeks    Status On-going      PT LONG TERM GOAL #3   Title perform turning using rolling walker or no device with safety and minimal need for cueing by PT    Time 8    Period Weeks    Status On-going                   Plan - 12/27/20 1024     Clinical Impression Statement Pt had another fall this Sunday. RTLE unchanged at this time with ambulation. Pt now with 2 fallls in 1 week. Pt performs all supine hip and knee exercises well, definite fatigue and mild loss of  control at end of sesison ( 30 min ). PTA suggested she discuss with PT on next visit returning to the MD.    Personal Factors and Comorbidities Comorbidity 2    Comorbidities chronic gait abnormality, Rt LE weaknss, L3 nerve root impingement    Examination-Activity Limitations Locomotion Level;Squat;Stairs;Stand;Transfers    Examination-Participation Restrictions Cleaning;Community Activity;Meal Prep    Stability/Clinical Decision Making Evolving/Moderate complexity    Rehab Potential Good    PT Frequency 2x / week    PT Duration 8 weeks    PT Treatment/Interventions ADLs/Self Care Home Management;Cryotherapy;Traction;Electrical Stimulation;Gait training;Stair training;Functional mobility training;Therapeutic activities;Therapeutic exercise;Balance training;Neuromuscular re-education;Patient/family education;Manual techniques;Passive range of motion;Dry needling;Taping    PT Next Visit Plan Discuss with pt returning to MD secondary to increased falls and unchanging nature of RTLE    PT Home Exercise Plan Access Code: [email or web address]    Consulted and Agree with Plan of Care Patient             Patient will benefit from skilled therapeutic intervention in order to improve the following deficits and impairments:  Abnormal gait, Decreased activity tolerance, Decreased strength, Impaired flexibility, Pain, Difficulty walking, Decreased endurance, Decreased balance  Visit Diagnosis: Muscle weakness (generalized)  Other abnormalities of gait and mobility  Pain in right leg  Pain in right hip  Other muscle spasm     Problem List Patient Active Problem List   Diagnosis Date Noted   Arthritis of right hip 09/17/2019   Labral tear of right hip joint 08/06/2019   Gluteal tendonitis of right buttock 05/22/2019   Tear of left gluteus medius tendon 01/04/2019   Arthritis of left hip 03/20/2018   Greater trochanteric bursitis of right hip 06/13/2017   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  11/29/2016   Breast cancer of upper-outer quadrant of left female breast ([place]) 12/07/2014   Acute recurrent sinusitis 01/17/2013    Donnavan Covault, PTA 12/27/2020, 10:50 AM  [facility] [address], [place], [place], [address] Phone: [phone]   Fax:  [phone]  Name: Christina [name] MRN: [id number] Date of Birth: 08-20-1950

## 2020-12-29 DIAGNOSIS — L570 Actinic keratosis: Secondary | ICD-10-CM | POA: Diagnosis not present

## 2020-12-29 DIAGNOSIS — Z85828 Personal history of other malignant neoplasm of skin: Secondary | ICD-10-CM | POA: Diagnosis not present

## 2020-12-29 DIAGNOSIS — D692 Other nonthrombocytopenic purpura: Secondary | ICD-10-CM | POA: Diagnosis not present

## 2020-12-29 DIAGNOSIS — L821 Other seborrheic keratosis: Secondary | ICD-10-CM | POA: Diagnosis not present

## 2020-12-29 DIAGNOSIS — L820 Inflamed seborrheic keratosis: Secondary | ICD-10-CM | POA: Diagnosis not present

## 2020-12-30 ENCOUNTER — Ambulatory Visit: Payer: PPO | Attending: Family Medicine

## 2020-12-30 ENCOUNTER — Other Ambulatory Visit: Payer: Self-pay

## 2020-12-30 DIAGNOSIS — M79604 Pain in right leg: Secondary | ICD-10-CM | POA: Diagnosis not present

## 2020-12-30 DIAGNOSIS — M25551 Pain in right hip: Secondary | ICD-10-CM | POA: Insufficient documentation

## 2020-12-30 DIAGNOSIS — M6281 Muscle weakness (generalized): Secondary | ICD-10-CM | POA: Insufficient documentation

## 2020-12-30 DIAGNOSIS — R2689 Other abnormalities of gait and mobility: Secondary | ICD-10-CM | POA: Insufficient documentation

## 2020-12-30 NOTE — Therapy (Signed)
[facility] [address], [place], [place], [address] Phone: [phone]   Fax:  [phone]  Physical Therapy Treatment  Patient Details  Name: Christina [name] MRN: [id number] Date of Birth: [birth year] Referring Provider (PT): Lynne Leader, MD   Encounter Date: 12/30/2020   PT End of Session - 12/30/20 1141     Visit Number 12    Date for PT Re-Evaluation 01/06/21    Authorization Type Medicare- KX needed at 15    Progress Note Due on Visit 15    PT Start Time 1102    PT Stop Time 1135    PT Time Calculation (min) 33 min    Activity Tolerance Patient tolerated treatment well;Patient limited by fatigue;Treatment limited secondary to medical complications (Comment)    Behavior During Therapy [facility] for tasks assessed/performed             Past Medical History:  Diagnosis Date   Abrasion of right leg 12/17/2014   Adrenal insufficiency ([place])    Breast cancer of upper-outer quadrant of left female breast ([place]) 12/07/2014   Cataract, immature    bilateral   Dental bridge present    upper - x 2   Dental crowns present    Hypothyroidism    Personal history of radiation therapy    Prediabetes     Past Surgical History:  Procedure Laterality Date   ABDOMINOPLASTY     BALLOON SINUPLASTY     BREAST LUMPECTOMY Left 2016   BREAST LUMPECTOMY WITH RADIOACTIVE SEED AND SENTINEL LYMPH NODE BIOPSY Left 12/22/2014   Procedure: BREAST LUMPECTOMY WITH RADIOACTIVE SEED AND SENTINEL LYMPH NODE BIOPSY;  Surgeon: Stark Klein, MD;  Location: [place];  Service: General;  Laterality: Left;   BUNIONECTOMY Right    HAMMER TOE SURGERY Bilateral    RHINOPLASTY     SEPTOPLASTY      There were no vitals filed for this visit.   Subjective Assessment - 12/30/20 1111     Subjective No significant changes.    Diagnostic tests 2018 multi-level disc dessication and lateral stenosis, right foraminal disc extrusion L3/4  with right L3 nerve root impingement    Currently in Pain? No/denies                               [facility] Adult PT Treatment/Exercise - 12/30/20 0001       Knee/Hip Exercises: Aerobic   Nustep L1 x 7 min concurrent discusion of current status      Knee/Hip Exercises: Seated   Clamshell with TheraBand --   red loop in supine: 2x10     Knee/Hip Exercises: Supine   Short Arc Quad Sets Strengthening;Right;2 sets;10 reps    Short Arc Quad Sets Limitations 1.5# the 2#    Bridges Strengthening;Both;2 sets;10 reps    Bridges Limitations VC to contract Rt glute more    Straight Leg Raises Limitations 1.5# 2x10    Other Supine Knee/Hip Exercises Marching Bil (alt) 1.5# on RT ankle 2x5                      PT Short Term Goals - 12/30/20 1118       PT SHORT TERM GOAL #3   Title Pt will be able to perform step through gait with consistent heel strike on Rt LE x 25 feet using walker    Baseline no change in gait  pattern    Status On-going               PT Long Term Goals - 12/30/20 1118       PT LONG TERM GOAL #1   Title Pt with achieve Rt hip flexor strength of at least 4/5 for improved daily task endurance and ambulation.    Status On-going      PT LONG TERM GOAL #2   Title demonstrate Rt foot clearance with gait x 40 feet without verbal cuing from PT to improve safety with gait    Status On-going                   Plan - 12/30/20 1124     Clinical Impression Statement Pt with continued gait abnormality and inability to advance the Rt LE with frequent drag of Rt toe.  Pt has had 3 falls since the start of care due to catching her Rt toe on the ground.  Pt continues to walk with a rolling walker for safety.  Pt has a documented visit with the MD last month and denies being in the office to see him.  Reports that she feels stronger overall but hasn't seen changes in gait.  Pt advanced to 2# weights with SAQs this week. Pt fatigues  quickly with exercises.  Pt will be reassessed next week to determine need for further PT.    PT Frequency 2x / week    PT Duration 8 weeks    PT Treatment/Interventions ADLs/Self Care Home Management;Cryotherapy;Traction;Electrical Stimulation;Gait training;Stair training;Functional mobility training;Therapeutic activities;Therapeutic exercise;Balance training;Neuromuscular re-education;Patient/family education;Manual techniques;Passive range of motion;Dry needling;Taping    PT Next Visit Plan reassessment next week    PT Home Exercise Plan Access Code: [email or web address]    Consulted and Agree with Plan of Care Patient             Patient will benefit from skilled therapeutic intervention in order to improve the following deficits and impairments:  Abnormal gait, Decreased activity tolerance, Decreased strength, Impaired flexibility, Pain, Difficulty walking, Decreased endurance, Decreased balance  Visit Diagnosis: Muscle weakness (generalized)  Other abnormalities of gait and mobility  Pain in right leg  Pain in right hip     Problem List Patient Active Problem List   Diagnosis Date Noted   Arthritis of right hip 09/17/2019   Labral tear of right hip joint 08/06/2019   Gluteal tendonitis of right buttock 05/22/2019   Tear of left gluteus medius tendon 01/04/2019   Arthritis of left hip 03/20/2018   Greater trochanteric bursitis of right hip 06/13/2017   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 11/29/2016   Breast cancer of upper-outer quadrant of left female breast ([place]) 12/07/2014   Acute recurrent sinusitis 01/17/2013    Christina [name], PT 12/30/20 11:44 AM   [facility] [address], [place], [place], [address] Phone: [phone]   Fax:  [phone]  Name: Christina [name] MRN: [id number] Date of Birth: [birth year]

## 2021-01-04 ENCOUNTER — Other Ambulatory Visit: Payer: Self-pay

## 2021-01-04 ENCOUNTER — Ambulatory Visit: Payer: PPO

## 2021-01-04 DIAGNOSIS — R2689 Other abnormalities of gait and mobility: Secondary | ICD-10-CM

## 2021-01-04 DIAGNOSIS — M6281 Muscle weakness (generalized): Secondary | ICD-10-CM

## 2021-01-04 DIAGNOSIS — M79604 Pain in right leg: Secondary | ICD-10-CM

## 2021-01-04 NOTE — Therapy (Signed)
[facility] [address] [place], [place], [place], [address] Phone: [phone]   Fax:  [phone]  Physical Therapy Treatment  Patient Details  Name: Christina [name] MRN: [id number] Date of Birth: 12-15-1950 Referring Provider (PT): Lynne Leader, MD   Encounter Date: 01/04/2021   PT End of Session - 01/04/21 1005     Visit Number 13    Date for PT Re-Evaluation 01/06/21    Authorization Type Medicare- KX needed at 15    Progress Note Due on Visit 3    PT Start Time 0932    PT Stop Time 1006    PT Time Calculation (min) 34 min    Activity Tolerance Patient tolerated treatment well;Patient limited by fatigue;Treatment limited secondary to medical complications (Comment)    Behavior During Therapy [facility] for tasks assessed/performed             Past Medical History:  Diagnosis Date   Abrasion of right leg 12/17/2014   Adrenal insufficiency ([place])    Breast cancer of upper-outer quadrant of left female breast ([place]) 12/07/2014   Cataract, immature    bilateral   Dental bridge present    upper - x 2   Dental crowns present    Hypothyroidism    Personal history of radiation therapy    Prediabetes     Past Surgical History:  Procedure Laterality Date   ABDOMINOPLASTY     BALLOON SINUPLASTY     BREAST LUMPECTOMY Left 2016   BREAST LUMPECTOMY WITH RADIOACTIVE SEED AND SENTINEL LYMPH NODE BIOPSY Left 12/22/2014   Procedure: BREAST LUMPECTOMY WITH RADIOACTIVE SEED AND SENTINEL LYMPH NODE BIOPSY;  Surgeon: Stark Klein, MD;  Location: [place];  Service: General;  Laterality: Left;   BUNIONECTOMY Right    HAMMER TOE SURGERY Bilateral    RHINOPLASTY     SEPTOPLASTY      There were no vitals filed for this visit.   Subjective Assessment - 01/04/21 0942     Subjective I am tired today.  I walked this weekend around my house without my walker to practice.                                [facility] Adult PT Treatment/Exercise - 01/04/21 0001       Knee/Hip Exercises: Aerobic   Nustep L1 x 7 min concurrent discusion of current status      Knee/Hip Exercises: Standing   Other Standing Knee Exercises seated: Rt march over floor hurdle x10, standing march over floor hurdle x10      Knee/Hip Exercises: Seated   Clamshell with TheraBand --   red loop in supine: 2x10     Knee/Hip Exercises: Supine   Short Arc Quad Sets Strengthening;Right;2 sets;10 reps    Short Arc Quad Sets Limitations 1.5# then 2#    [employer] Strengthening;Both;2 sets;10 reps    Straight Leg Raises Limitations 1.5# 2x10    Other Supine Knee/Hip Exercises Marching Bil (alt) 1.5# on RT ankle 2x5                      PT Short Term Goals - 01/04/21 0943       PT SHORT TERM GOAL #1   Title Pt will be ind with initial HEP    Status Achieved      PT SHORT TERM GOAL #2   Title Pt will be able  to perform 10 reps of SLR on Rt LE wihtout quad lag to demo improved Rt hip flexor strength/endurance    Status Achieved      PT SHORT TERM GOAL #3   Title Pt will be able to perform step through gait with consistent heel strike on Rt LE x 25 feet using walker    Baseline no change in gait pattern    Status On-going               PT Long Term Goals - 01/04/21 0945       PT LONG TERM GOAL #2   Title demonstrate Rt foot clearance with gait x 40 feet without verbal cuing from PT to improve safety with gait    Status Not Met      PT LONG TERM GOAL #3   Title perform turning using rolling walker or no device with safety and minimal need for cueing by PT    Status Not Met                   Plan - 01/04/21 0957     Clinical Impression Statement Pt with continued gait abnormality and inability to advance the Rt LE with frequent drag of Rt toe.  Pt has had 3 falls since the start of care due to catching her Rt toe on the ground.  Pt continues to walk  with a rolling walker for safety and demonstrated max UE support today. Pt Reports that she feels stronger overall but hasn't seen changes in gait. Pt discussed with pt that due to lack of progress, she will be discharged to HEP next session and pt agreed to this.   Pt fatigues quickly with exercises and demonstrates substitution with marching over an object in sitting and standing.  Plan D/C next session to HEP.    PT Frequency 2x / week    PT Duration 8 weeks    PT Treatment/Interventions ADLs/Self Care Home Management;Cryotherapy;Traction;Electrical Stimulation;Gait training;Stair training;Functional mobility training;Therapeutic activities;Therapeutic exercise;Balance training;Neuromuscular re-education;Patient/family education;Manual techniques;Passive range of motion;Dry needling;Taping    PT Next Visit Plan Plan D/C to HEP    PT Home Exercise Plan Access Code: [email or web address]    Consulted and Agree with Plan of Care Patient             Patient will benefit from skilled therapeutic intervention in order to improve the following deficits and impairments:  Abnormal gait, Decreased activity tolerance, Decreased strength, Impaired flexibility, Pain, Difficulty walking, Decreased endurance, Decreased balance  Visit Diagnosis: No diagnosis found.     Problem List Patient Active Problem List   Diagnosis Date Noted   Arthritis of right hip 09/17/2019   Labral tear of right hip joint 08/06/2019   Gluteal tendonitis of right buttock 05/22/2019   Tear of left gluteus medius tendon 01/04/2019   Arthritis of left hip 03/20/2018   Greater trochanteric bursitis of right hip 06/13/2017   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 11/29/2016   Breast cancer of upper-outer quadrant of left female breast ([place]) 12/07/2014   Acute recurrent sinusitis 01/17/2013    Sigurd Sos, PT 01/04/21 10:08 AM  [facility] [address] [place], [place], [address] Phone: [phone]   Fax:  [phone]  Name: Christina [name] MRN: [id number] Date of Birth: 02-26-1951

## 2021-01-06 ENCOUNTER — Ambulatory Visit: Payer: PPO

## 2021-01-06 ENCOUNTER — Other Ambulatory Visit: Payer: Self-pay

## 2021-01-06 DIAGNOSIS — M6281 Muscle weakness (generalized): Secondary | ICD-10-CM | POA: Diagnosis not present

## 2021-01-06 DIAGNOSIS — M25551 Pain in right hip: Secondary | ICD-10-CM

## 2021-01-06 DIAGNOSIS — R2689 Other abnormalities of gait and mobility: Secondary | ICD-10-CM

## 2021-01-06 DIAGNOSIS — M79604 Pain in right leg: Secondary | ICD-10-CM

## 2021-01-06 NOTE — Therapy (Signed)
[facility] [address], [place], [place], [address] Phone: [phone]   Fax:  [phone]  Physical Therapy Treatment  Patient Details  Name: Christina [name] MRN: [id number] Date of Birth: Oct 11, 1950 Referring Provider (PT): Lynne Leader, MD   Encounter Date: 01/06/2021   PT End of Session - 01/06/21 1125     Visit Number 14    PT Start Time 1102    PT Stop Time 1125    PT Time Calculation (min) 23 min    Activity Tolerance Patient tolerated treatment well;Patient limited by fatigue    Behavior During Therapy [facility] for tasks assessed/performed             Past Medical History:  Diagnosis Date   Abrasion of right leg 12/17/2014   Adrenal insufficiency ([place])    Breast cancer of upper-outer quadrant of left female breast ([place]) 12/07/2014   Cataract, immature    bilateral   Dental bridge present    upper - x 2   Dental crowns present    Hypothyroidism    Personal history of radiation therapy    Prediabetes     Past Surgical History:  Procedure Laterality Date   ABDOMINOPLASTY     BALLOON SINUPLASTY     BREAST LUMPECTOMY Left 2016   BREAST LUMPECTOMY WITH RADIOACTIVE SEED AND SENTINEL LYMPH NODE BIOPSY Left 12/22/2014   Procedure: BREAST LUMPECTOMY WITH RADIOACTIVE SEED AND SENTINEL LYMPH NODE BIOPSY;  Surgeon: Stark Klein, MD;  Location: [place];  Service: General;  Laterality: Left;   BUNIONECTOMY Right    HAMMER TOE SURGERY Bilateral    RHINOPLASTY     SEPTOPLASTY      There were no vitals filed for this visit.   Subjective Assessment - 01/06/21 1105     Subjective I found the resistance on my NuStep and I did it a couple of times yesterday.    Pertinent History frequent falls, chronic Rt LE weakness    Diagnostic tests 2018 multi-level disc dessication and lateral stenosis, right foraminal disc extrusion L3/4 with right L3 nerve root impingement    Currently in Pain? No/denies                 [facility] PT Assessment - 01/06/21 0001       Assessment   Medical Diagnosis Rt thigh pain, injury of spinal nerve root at L3 level, weakness of Rt hip    Referring Provider (PT) Lynne Leader, MD      Prior Function   Level of Independence Requires assistive device for independence;Independent with basic ADLs    Vocation Retired      Associate Professor   Overall Cognitive Status Within Functional Limits for tasks assessed      Strength   Right Hip Flexion 4-/5    Right Hip ABduction 4/5                           [facility] Adult PT Treatment/Exercise - 01/06/21 0001       Knee/Hip Exercises: Aerobic   Nustep L1 x 3 min concurrent discusion of current status. Pt fatigued early today and requested to stop.      Knee/Hip Exercises: Standing   Other Standing Knee Exercises seated: Rt march over floor hurdle x10, then x3- fatigue with this      Knee/Hip Exercises: Seated   Clamshell with TheraBand --   red loop in supine: 2x10  Knee/Hip Exercises: Supine   Short Arc Quad Sets Strengthening;Right;2 sets;10 reps    Short Arc Quad Sets Limitations 1.5#                       PT Short Term Goals - 01/04/21 0943       PT SHORT TERM GOAL #1   Title Pt will be ind with initial HEP    Status Achieved      PT SHORT TERM GOAL #2   Title Pt will be able to perform 10 reps of SLR on Rt LE wihtout quad lag to demo improved Rt hip flexor strength/endurance    Status Achieved      PT SHORT TERM GOAL #3   Title Pt will be able to perform step through gait with consistent heel strike on Rt LE x 25 feet using walker    Baseline no change in gait pattern    Status On-going               PT Long Term Goals - 01/06/21 1106       PT LONG TERM GOAL #2   Title demonstrate Rt foot clearance with gait x 40 feet without verbal cuing from PT to improve safety with gait    Baseline significant drag of the Rt foot- is able to use steppage gait  intermittently with verbal cues    Status Not Met      PT LONG TERM GOAL #3   Title perform turning using rolling walker or no device with safety and minimal need for cueing by PT    Baseline max cueing provided    Status Not Met      PT LONG TERM GOAL #4   Title Pt will be able to demo gait  in a 3 min walk test with moderate shuffling of Rt LE to improve community function and safety    Baseline fatigued today and not able to perform    Status Not Met      PT LONG TERM GOAL #5   Title Pt will be ind with advanced HEP                   Plan - 01/06/21 1120     Clinical Impression Statement Pt with continued gait abnormality and inability to advance the Rt LE with frequent drag of Rt toe.  If given frequent cueing, pt is able to walk utilizing a steppage gait pattern yet fatigues easily.   Pt has had 3 falls since the start of care due to catching her Rt toe on the ground.  Pt continues to walk with a rolling walker for safety and demonstrated max UE support today. Pt Reports that she feels stronger overall but hasn't seen changes in gait. PT verbally reviewed all HEP and discussed how to pace herself to avoid overfatigue and use NuStep at home for endurance gains.   Pt fatigues quickly with exercises and demonstrates substitution with marching over an object in sitting and standing.  Pt requested to end session early as she had worn herself out yesterday with NuStep and did 5 min on NuStep before arriving today.  Pt has maximized potential with PT and will be discharged to HEP today.    PT Next Visit Plan D/C PT to HEP    PT Home Exercise Plan Access Code: [email or web address]    Consulted and Agree with Plan of Care Patient  Patient will benefit from skilled therapeutic intervention in order to improve the following deficits and impairments:     Visit Diagnosis: Other abnormalities of gait and mobility  Muscle weakness (generalized)  Pain in right hip  Pain in right  leg     Problem List Patient Active Problem List   Diagnosis Date Noted   Arthritis of right hip 09/17/2019   Labral tear of right hip joint 08/06/2019   Gluteal tendonitis of right buttock 05/22/2019   Tear of left gluteus medius tendon 01/04/2019   Arthritis of left hip 03/20/2018   Greater trochanteric bursitis of right hip 06/13/2017   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 11/29/2016   Breast cancer of upper-outer quadrant of left female breast ([place]) 12/07/2014   Acute recurrent sinusitis 01/17/2013   PHYSICAL THERAPY DISCHARGE SUMMARY  Visits from Start of Care: 14  Current functional level related to goals / functional outcomes: Pt with significant gait abnormality due to Rt hip flexor weakness.  No change in gait pattern since the start of care.     Remaining deficits: See above.     Education / Equipment: Fall prevention, pacing with exercise, graded exposure   Patient agrees to discharge. Patient goals were partially met. Patient is being discharged due to maximized rehab potential.    Sigurd Sos, PT 01/06/21 11:27 AM   [facility] [address] [place], [place], [address] Phone: [phone]   Fax:  [phone]  Name: EUTHA CUDE MRN: [id number] Date of Birth: 12-Feb-1951

## 2021-01-12 ENCOUNTER — Encounter: Payer: PPO | Admitting: Physical Therapy

## 2021-01-14 ENCOUNTER — Encounter: Payer: PPO | Admitting: Physical Therapy

## 2021-01-21 NOTE — Progress Notes (Signed)
[facility])                                            [place] Team                                        Statin Quality Measure Assessment    01/21/2021  Christina [name] 1951-03-14 [id number]  Per review of chart and payor information, this patient has been flagged for non-adherence to the following CMS Quality Measure:   [x]  Statin Use in Persons with Diabetes  []  Statin Use in Persons with Cardiovascular Disease  The 10-year ASCVD risk score (Arnett DK, et al., 2019) is: 13.5%   Values used to calculate the score:     Age: 70 years     Sex: Female     Is Non-Hispanic African American: No     Diabetic: No     Tobacco smoker: No     Systolic Blood Pressure: 614 mmHg     Is BP treated: No     HDL Cholesterol: 75.7 mg/dL     Total Cholesterol: 201 mg/dL LDL 91 mg/dL 03/01/2020  The patient is failing the SUPD CMS measure as a result A1c being in the prediabetes range (6.3%) and filling metformin.   Please consider ONE of the following recommendations:   Initiate high intensity statin Atorvastatin 40mg  once daily, #90, 3 refills   Rosuvastatin 20mg  once daily, #90, 3 refills    Initiate moderate intensity          statin with reduced frequency if prior          statin intolerance 1x weekly, #13, 3 refills   2x weekly, #26, 3 refills   3x weekly, #39, 3 refills    Code for past statin intolerance or other exclusions (required annually)  Drug Induced Myopathy G72.0   Myositis, unspecified M60.9   Rhabdomyolysis M62.82   Prediabetes R73.03   Adverse effect of antihyperlipidemic and antiarteriosclerotic drugs, initial encounter E31.5Q0G    Thank you for your time,  Kristeen Miss, [place] Cell: [phone]

## 2021-01-25 ENCOUNTER — Encounter: Payer: PPO | Admitting: Internal Medicine

## 2021-01-27 ENCOUNTER — Other Ambulatory Visit: Payer: Self-pay

## 2021-01-27 ENCOUNTER — Ambulatory Visit (INDEPENDENT_AMBULATORY_CARE_PROVIDER_SITE_OTHER): Payer: PPO | Admitting: Nurse Practitioner

## 2021-01-27 ENCOUNTER — Encounter: Payer: Self-pay | Admitting: Nurse Practitioner

## 2021-01-27 VITALS — BP 126/72 | HR 103 | Temp 98.2°F | Resp 18 | Ht 63.0 in | Wt 121.0 lb

## 2021-01-27 DIAGNOSIS — R7303 Prediabetes: Secondary | ICD-10-CM | POA: Diagnosis not present

## 2021-01-27 DIAGNOSIS — F419 Anxiety disorder, unspecified: Secondary | ICD-10-CM

## 2021-01-27 DIAGNOSIS — W19XXXA Unspecified fall, initial encounter: Secondary | ICD-10-CM

## 2021-01-27 DIAGNOSIS — Z131 Encounter for screening for diabetes mellitus: Secondary | ICD-10-CM | POA: Diagnosis not present

## 2021-01-27 DIAGNOSIS — E039 Hypothyroidism, unspecified: Secondary | ICD-10-CM | POA: Diagnosis not present

## 2021-01-27 DIAGNOSIS — G4700 Insomnia, unspecified: Secondary | ICD-10-CM

## 2021-01-27 DIAGNOSIS — Z13220 Encounter for screening for lipoid disorders: Secondary | ICD-10-CM | POA: Diagnosis not present

## 2021-01-27 LAB — CBC WITH DIFFERENTIAL/PLATELET
Basophils Absolute: 0 10*3/uL (ref 0.0–0.1)
Basophils Relative: 0.7 % (ref 0.0–3.0)
Eosinophils Absolute: 0.2 10*3/uL (ref 0.0–0.7)
Eosinophils Relative: 2.6 % (ref 0.0–5.0)
HCT: 35.1 % — ABNORMAL LOW (ref 36.0–46.0)
Hemoglobin: 11.5 g/dL — ABNORMAL LOW (ref 12.0–15.0)
Lymphocytes Relative: 15.7 % (ref 12.0–46.0)
Lymphs Abs: 1.2 10*3/uL (ref 0.7–4.0)
MCHC: 32.6 g/dL (ref 30.0–36.0)
MCV: 92.5 fl (ref 78.0–100.0)
Monocytes Absolute: 0.5 10*3/uL (ref 0.1–1.0)
Monocytes Relative: 7.1 % (ref 3.0–12.0)
Neutro Abs: 5.4 10*3/uL (ref 1.4–7.7)
Neutrophils Relative %: 73.9 % (ref 43.0–77.0)
Platelets: 271 10*3/uL (ref 150.0–400.0)
RBC: 3.8 Mil/uL — ABNORMAL LOW (ref 3.87–5.11)
RDW: 15.4 % (ref 11.5–15.5)
WBC: 7.3 10*3/uL (ref 4.0–10.5)

## 2021-01-27 LAB — COMPREHENSIVE METABOLIC PANEL
ALT: 14 U/L (ref 0–35)
AST: 19 U/L (ref 0–37)
Albumin: 4.4 g/dL (ref 3.5–5.2)
Alkaline Phosphatase: 50 U/L (ref 39–117)
BUN: 11 mg/dL (ref 6–23)
CO2: 25 mEq/L (ref 19–32)
Calcium: 9.6 mg/dL (ref 8.4–10.5)
Chloride: 103 mEq/L (ref 96–112)
Creatinine, Ser: 0.64 mg/dL (ref 0.40–1.20)
GFR: 89.08 mL/min (ref >60.00)
Glucose, Bld: 96 mg/dL (ref 70–99)
Potassium: 4.1 mEq/L (ref 3.5–5.1)
Sodium: 139 mEq/L (ref 135–145)
Total Bilirubin: 0.4 mg/dL (ref 0.2–1.2)
Total Protein: 6.7 g/dL (ref 6.0–8.3)

## 2021-01-27 LAB — LIPID PANEL
Cholesterol: 173 mg/dL (ref 0–200)
HDL: 60 mg/dL (ref >39.00)
LDL Cholesterol: 86 mg/dL (ref 0–99)
NonHDL: 113.17
Total CHOL/HDL Ratio: 3
Triglycerides: 135 mg/dL (ref 0.0–149.0)
VLDL: 27 mg/dL (ref 0.0–40.0)

## 2021-01-27 LAB — HEMOGLOBIN A1C: Hgb A1c MFr Bld: 6.3 % (ref 4.6–6.5)

## 2021-01-27 MED ORDER — CLONAZEPAM 0.5 MG PO TABS: 0.5000 mg | ORAL_TABLET | Freq: Every day | ORAL | 0 refills | Status: DC

## 2021-01-27 NOTE — Patient Instructions (Signed)
For Covid vaccine:   To schedule an appointment or to find more information, visit [email or web address] You can also call [phone], Monday through Friday from 7:00 am to 7:00 pm.

## 2021-01-27 NOTE — Progress Notes (Signed)
Subjective:  Patient ID: Christina [name], female    DOB: 1950/08/25  Age: 70 y.o. MRN: [id number]  CC:  Chief Complaint  Patient presents with   Transfer of Care    No concerns.    Medication Refill    Klonopin      HPI  This patient arrives today for the above.  She is here to transfer to myself as her primary care provider.  She was seeing nurse practitioner Kendra Opitz before myself.  Her main concern today is that she needs a refill on her Klonopin which he tells me she takes for anxiety and insomnia.  Per [facility] controlled substance database review she has not had Klonopin prescribed to her in about 10 months.  She tells me she only takes it as needed, and quite seldom at that.  She also mentions that she has been experiencing lower extremity weakness intermittently which has been ongoing for quite some time and she is been having some falls.  She does follow-up with sports medicine regularly for this.  She was working with physical therapy but that eventually stopped, and she tells me she feels that her strength is starting to deteriorate.  She would like to be referred to physical therapy if possible.  She tells me she has had a couple of falls at home here recently.  She arrives today in a wheelchair and tells me sometimes she uses a walker, but felt more comfortable using her wheelchair today.  She also tells me she has a history of hypothyroidism follows with endocrinology.  Last thyroid panel did show suppressed TSH, she is on Cytomel.  She tells me she is tolerating her dose well and denies any cardiac palpitations or chest pain.  Past Medical History:  Diagnosis Date   Abrasion of right leg 12/17/2014   Adrenal insufficiency ([facility])    Breast cancer of upper-outer quadrant of left female breast ([place]) 12/07/2014   Cataract, immature    bilateral   Dental bridge present    upper - x 2   Dental crowns present    Hypothyroidism    Personal history of radiation  therapy    Prediabetes       Family History  Problem Relation Age of Onset   Diabetes Mother    Diabetes Father    Cancer Maternal Uncle        pancreatic cancer    Diabetes Paternal Grandmother    Diabetes Paternal Grandfather    Breast cancer Sister     Social History   Social History Narrative   Right handed   Social History   Tobacco Use   Smoking status: Former    Packs/day: 0.00    Years: 0.00    Pack years: 0.00    Types: Cigarettes    Quit date: 05/01/1980    Years since quitting: 40.7   Smokeless tobacco: Never  Substance Use Topics   Alcohol use: Yes    Alcohol/week: 0.0 standard drinks    Comment: occasionally     Current Meds  Medication Sig   albuterol (PROVENTIL HFA;VENTOLIN HFA) 108 (90 BASE) MCG/ACT inhaler Inhale into the lungs every 6 (six) hours as needed for wheezing or shortness of breath.   cetirizine (ZYRTEC) 10 MG tablet Take 10 mg by mouth at bedtime.   cycloSPORINE (RESTASIS) 0.05 % ophthalmic emulsion Place 1 drop into both eyes 2 (two) times daily.   fluticasone (FLONASE) 50 MCG/ACT nasal spray Place 1 spray  into both nostrils at bedtime.   ibuprofen (ADVIL,MOTRIN) 200 MG tablet Take 600 mg by mouth every 4 (four) hours as needed for fever, headache, mild pain, moderate pain or cramping.   liothyronine (CYTOMEL) 25 MCG tablet Take 50 mcg by mouth daily.   melatonin 5 MG TABS Take 5 mg by mouth at bedtime as needed for sleep. f   metFORMIN (GLUCOPHAGE-XR) 500 MG 24 hr tablet Take 500 mg by mouth daily.   montelukast (SINGULAIR) 10 MG tablet Take 1 tablet (10 mg total) by mouth at bedtime.   traMADol (ULTRAM) 50 MG tablet Take 1 tablet (50 mg total) by mouth every 6 (six) hours as needed for moderate pain.   [DISCONTINUED] clonazePAM (KLONOPIN) 1 MG tablet Take 1 tablet (1 mg total) by mouth at bedtime. (Patient taking differently: Take 0.5 mg by mouth at bedtime.)    ROS:  Review of Systems  Constitutional:  Negative for chills, fever  and weight loss.  Eyes:  Negative for double vision.  Respiratory:  Negative for shortness of breath.   Cardiovascular:  Negative for chest pain.  Gastrointestinal:  Negative for blood in stool.  Neurological:  Negative for dizziness and headaches.    Objective:   Today's Vitals: BP 126/72   Pulse (!) 103   Temp 98.2 F (36.8 C) (Oral)   Resp 18   Ht 5\' 3"  (1.6 m)   Wt 121 lb (54.9 kg)   BMI 21.43 kg/m  Vitals with BMI 01/27/2021 12/10/2020 11/12/2020  Height 5\' 3"  5\' 3"  -  Weight 121 lbs 120 lbs -  BMI 69.62 95.28 -  Systolic 413 244 010  Diastolic 72 84 75  Pulse 272 93 80     Physical Exam Vitals reviewed.  Constitutional:      General: She is not in acute distress.    Appearance: Normal appearance.  HENT:     Head: Normocephalic and atraumatic.  Neck:     Vascular: No carotid bruit.  Cardiovascular:     Rate and Rhythm: Normal rate and regular rhythm.     Pulses: Normal pulses.     Heart sounds: Normal heart sounds.  Pulmonary:     Effort: Pulmonary effort is normal.     Breath sounds: Normal breath sounds.  Skin:    General: Skin is warm and dry.  Neurological:     General: No focal deficit present.     Mental Status: She is alert and oriented to person, place, and time.  Psychiatric:        Mood and Affect: Mood normal.        Behavior: Behavior normal.        Judgment: Judgment normal.         Assessment and Plan   1. Screening for diabetes mellitus   2. Screening, lipid   3. Prediabetes   4. Anxiety   5. Insomnia, unspecified type   6. Fall, initial encounter   7. Hypothyroidism, unspecified type      Plan: 1.-3.  We will check blood work today for further evaluation. 4., 5.  We will refill Klonopin. 6.  We will refer to physical therapy for assistance with improving strength and hopefully help preventing falls. 7.  I recommend she reach out to her endocrinologist and follow-up with them within the next 3 months for close monitoring.   She tells me she understands.   Tests ordered Orders Placed This Encounter  Procedures   Hemoglobin A1c   Lipid panel  Comprehensive metabolic panel   CBC with Differential/Platelet   Ambulatory referral to Physical Therapy      Meds ordered this encounter  Medications   DISCONTD: clonazePAM (KLONOPIN) 0.5 MG tablet    Sig: Take 1 tablet (0.5 mg total) by mouth at bedtime.    Dispense:  30 tablet    Refill:  0    Order Specific Question:   Supervising Provider    Answer:   BURNS, Claudina Lick [[id number]]   clonazePAM (KLONOPIN) 0.5 MG tablet    Sig: Take 1 tablet (0.5 mg total) by mouth at bedtime.    Dispense:  30 tablet    Refill:  0    Order Specific Question:   Supervising Provider    Answer:   Binnie Rail [id number]    Patient to follow-up in 3 months or sooner as needed.  Ailene Ards, NP

## 2021-03-08 NOTE — Progress Notes (Signed)
I, Wendy Poet, LAT, ATC, am serving as scribe for Dr. Lynne Leader.  Christina [name] is a 70 y.o. female who presents to [facility] today for f/u of R leg/thigh weakness and balance problems/frequent falls.  She was last seen by Dr. Shelly Bombard on 12/10/20 and noted improvement in her balance and walking.  At that point, she con't to have care daily x 4 hours, 7 days/week.  She was advised to con't her HEP and to consider a regular walker vs a rolling walker.  She has completed 14 PT sessions w/ her last one being on 01/06/21.  Today, pt reports that she feels like she has worsened since stopping PT.  She notes particularly difficulty w/ walking and general mobility.  She con't to ambulate w/ a RW.  Diagnostic testing: C-spine and brain MRI- 08/25/20; CT head and c-spine w/o contrast- 08/13/20; L-spine MRI- 03/06/20; L-spine XR- 02/25/20  Pertinent review of systems: No fevers or chills  Relevant historical information: History of breast cancer in 2016.   Exam:  BP (!) 158/80 (BP Location: Right Arm, Patient Position: Sitting, Cuff Size: Normal)   Ht 5\' 3"  (1.6 m)   BMI 21.43 kg/m  General: Well Developed, well nourished, and in no acute distress.   MSK: L-spine nontender midline.  Lower extremity strength hip flexion diminished 4/5 otherwise lower extremity strength is intact. Reflexes and sensation are intact distally.    Lab and Radiology Results  EXAM: MRI LUMBAR SPINE WITHOUT CONTRAST   TECHNIQUE: Multiplanar, multisequence MR imaging of the lumbar spine was performed. No intravenous contrast was administered.   COMPARISON:  Lumbar spine radiographs 02/25/2020. Lumbar spine MRI 11/02/2016.   FINDINGS: Segmentation: For the purposes of this dictation, five lumbar vertebrae are assumed and the caudal most well-formed intervertebral disc is designated L5-S1. Spinal numbering will remain consistent with that utilized on the prior MRI of  11/02/2016.   Alignment: Straightening of the expected lumbar lordosis. No significant spondylolisthesis.   Vertebrae: Vertebral body height is maintained. No significant marrow edema or focal suspicious osseous lesion.   Conus medullaris and cauda equina: Conus extends to the T12-L1 level. No signal abnormality within the visualized distal spinal cord.   Paraspinal and other soft tissues: No abnormality identified within included portions of the abdomen/retroperitoneum. Paraspinal soft tissues within normal limits.   Disc levels:   Unless otherwise stated, the level by level findings below have not significantly changed since prior MRI 11/02/2016.   Congenitally narrow lumbar spinal canal on the basis of short pedicles.   Multilevel disc degeneration. Most notably, there is mild/moderate disc degeneration at L2-L3 and L3-L4.   T12-L1: No significant disc herniation or stenosis.   L1-L2: A previously described 4 mm T2 hyperintense lesion within the right extraforaminal region is no longer definitively appreciated. Minimal disc bulge. Mild facet arthrosis. No significant spinal canal or foraminal narrowing.   L2-L3: Disc bulge with endplate spurring. Superimposed small right foraminal disc protrusion. Mild facet arthrosis/ligamentum flavum hypertrophy. Mild/moderate bilateral subarticular narrowing without frank nerve root impingement. Mild relative narrowing of the central canal. Mild right neural foraminal narrowing.   L3-L4: Disc bulge. Superimposed right foraminal/extraforaminal cranially migrated disc extrusion. Mild facet arthrosis/ligamentum flavum hypertrophy. Mild/moderate bilateral subarticular stenosis without frank nerve root impingement. Mild relative narrowing of the central canal. As before, the disc extrusion contributes to severe right neural foraminal narrowing with encroachment upon the exiting right L3 nerve root. Mild/moderate left neural foraminal  narrowing.  L4-L5: Disc bulge with mild endplate spurring. Mild facet arthrosis. Mild bilateral subarticular narrowing without nerve root impingement. Minimal relative narrowing of the central canal. Mild right neural foraminal narrowing. Moderate to moderately severe left neural foraminal narrowing, slightly progressed.   L5-S1: Minimal facet arthrosis. No significant disc herniation or stenosis.   IMPRESSION: Lumbar spondylosis as outlined with findings most notably as follows. Apart from slight progression of moderate to moderately severe left L4-L5 neural foraminal narrowing, findings have not significantly changed since the MRI of 11/02/2016   At L3-L4, there is mild/moderate disc degeneration. Unchanged right foraminal/extraforaminal cranially migrated disc extrusion contributing to severe right neural foraminal narrowing, approaching upon the exiting right L3 nerve root. Multifactorial mild/moderate bilateral subarticular narrowing without frank nerve root impingement. Mild/moderate left neural foraminal narrowing.   At L2-L3, there is mild/moderate disc degeneration. Multifactorial mild/moderate bilateral subarticular narrowing without frank nerve root impingement. Mild right neural foraminal narrowing.   At L4-L5, there is multifactorial moderate to moderately severe left neural foraminal narrowing, slightly progressed. Multifactorial mild spinal canal and right neural foraminal narrowing.     Electronically Signed   By: Kellie Simmering DO   On: 03/07/2020 15:49 I, Lynne Leader, personally (independently) visualized and performed the interpretation of the images attached in this note.     Assessment and Plan: 70 y.o. female with persistent right leg weakness with difficulty with gait.  Patient has had extensive trials of conservative management over the last year including epidural steroid injection right L3 nerve root March 10, 2020, March 31, 2020 and most  recently November 12, 2020 with little benefit.  She recently completed physical therapy in September and was discharged due to plateauing.  She is worsened a bit since completing physical therapy about 2 months ago would like to retry physical therapy.  However I fear that she will only get so much better.  I think at this point is worthwhile to have a conversation with spine surgeon about her potential surgical options.  Last year she had an MRI showing severe nerve impingement at the right L3 nerve root which I think is causing her hip flexor weakness which is her main issue.  She has had 3 epidural steroid injections over the last year with little benefit.  She may be a candidate for surgical decompression, however I also fear that she may have waited too long.  I think it is worth having a conversation in a surgical consultation.  Plan to refer to Dr. Lynann Bologna at [facility] orthopedics. Refer back to PT.  Recheck with me in about 3 months or sooner if needed.    PDMP not reviewed this encounter. Orders Placed This Encounter  Procedures   Ambulatory referral to Physical Therapy    Referral Priority:   Routine    Referral Type:   Physical Medicine    Referral Reason:   Specialty Services Required    Requested Specialty:   Physical Therapy    Number of Visits Requested:   1   Ambulatory referral to Orthopedic Surgery    Referral Priority:   Routine    Referral Type:   Surgical    Referral Reason:   Specialty Services Required    Requested Specialty:   Orthopedic Surgery    Number of Visits Requested:   1   No orders of the defined types were placed in this encounter.    Discussed warning signs or symptoms. Please see discharge instructions. Patient expresses understanding.   The above documentation has  been reviewed and is accurate and complete Lynne Leader, M.D.  Total encounter time 30 minutes including face-to-face time with the patient and, reviewing past medical record, and  charting on the date of service.   Treatment plan and options

## 2021-03-09 ENCOUNTER — Ambulatory Visit (INDEPENDENT_AMBULATORY_CARE_PROVIDER_SITE_OTHER): Payer: PPO | Admitting: Family Medicine

## 2021-03-09 ENCOUNTER — Other Ambulatory Visit: Payer: Self-pay

## 2021-03-09 ENCOUNTER — Encounter: Payer: Self-pay | Admitting: Family Medicine

## 2021-03-09 VITALS — BP 158/80 | Ht 63.0 in

## 2021-03-09 DIAGNOSIS — R296 Repeated falls: Secondary | ICD-10-CM | POA: Diagnosis not present

## 2021-03-09 DIAGNOSIS — S3421XD Injury of nerve root of lumbar spine, subsequent encounter: Secondary | ICD-10-CM

## 2021-03-09 DIAGNOSIS — R269 Unspecified abnormalities of gait and mobility: Secondary | ICD-10-CM | POA: Diagnosis not present

## 2021-03-09 DIAGNOSIS — R29898 Other symptoms and signs involving the musculoskeletal system: Secondary | ICD-10-CM

## 2021-03-09 NOTE — Patient Instructions (Addendum)
Good to see you today.  I've referred you to Physical Therapy.  Please let me know if you haven't heard from them in one week regarding scheduling.  I've referred you to Dr. Lynann Bologna at [facility] for consultation.  Follow-up: 3 months (30 min appt)

## 2021-03-18 ENCOUNTER — Ambulatory Visit: Payer: PPO | Admitting: Family Medicine

## 2021-03-21 ENCOUNTER — Emergency Department (HOSPITAL_COMMUNITY)
Admission: EM | Admit: 2021-03-21 | Discharge: 2021-03-21 | Disposition: A | Discharge status: Home / Self Care | Payer: PPO | Attending: Emergency Medicine | Admitting: Emergency Medicine

## 2021-03-21 ENCOUNTER — Encounter (HOSPITAL_COMMUNITY): Payer: Self-pay | Admitting: Emergency Medicine

## 2021-03-21 DIAGNOSIS — W01198A Fall on same level from slipping, tripping and stumbling with subsequent striking against other object, initial encounter: Secondary | ICD-10-CM | POA: Insufficient documentation

## 2021-03-21 DIAGNOSIS — S41111A Laceration without foreign body of right upper arm, initial encounter: Secondary | ICD-10-CM | POA: Insufficient documentation

## 2021-03-21 DIAGNOSIS — W19XXXA Unspecified fall, initial encounter: Secondary | ICD-10-CM | POA: Diagnosis not present

## 2021-03-21 DIAGNOSIS — Z87891 Personal history of nicotine dependence: Secondary | ICD-10-CM | POA: Insufficient documentation

## 2021-03-21 DIAGNOSIS — Z79899 Other long term (current) drug therapy: Secondary | ICD-10-CM | POA: Insufficient documentation

## 2021-03-21 DIAGNOSIS — Z853 Personal history of malignant neoplasm of breast: Secondary | ICD-10-CM | POA: Diagnosis not present

## 2021-03-21 DIAGNOSIS — Z23 Encounter for immunization: Secondary | ICD-10-CM | POA: Insufficient documentation

## 2021-03-21 DIAGNOSIS — E039 Hypothyroidism, unspecified: Secondary | ICD-10-CM | POA: Insufficient documentation

## 2021-03-21 DIAGNOSIS — S4991XA Unspecified injury of right shoulder and upper arm, initial encounter: Secondary | ICD-10-CM | POA: Diagnosis present

## 2021-03-21 DIAGNOSIS — I10 Essential (primary) hypertension: Secondary | ICD-10-CM | POA: Diagnosis not present

## 2021-03-21 DIAGNOSIS — R58 Hemorrhage, not elsewhere classified: Secondary | ICD-10-CM | POA: Diagnosis not present

## 2021-03-21 MED ORDER — LIDOCAINE-EPINEPHRINE 1 %-1:100000 IJ SOLN
20.0000 mL | Freq: Once | INTRAMUSCULAR | Status: AC
  Administered 2021-03-21: 20 mL
  Filled 2021-03-21: qty 1

## 2021-03-21 MED ORDER — TETANUS-DIPHTH-ACELL PERTUSSIS 5-2.5-18.5 LF-MCG/0.5 IM SUSY
0.5000 mL | PREFILLED_SYRINGE | Freq: Once | INTRAMUSCULAR | Status: AC
  Administered 2021-03-21: 0.5 mL via INTRAMUSCULAR
  Filled 2021-03-21: qty 0.5

## 2021-03-21 NOTE — ED Triage Notes (Signed)
Patient BIB [facility] from home after she lost her balance and fell against an open cabinet door, resulting in an approximately 5 inch laceration to right arm. Is not anticoagulated, no LOC. Patient was still bleeding when EMS arrived to patient's home. EMS vitals 172/100 HR 108 RR 18 99% on room air CBG 147 97.25F

## 2021-03-21 NOTE — Discharge Instructions (Signed)
You were seen in the ER today for your arm injury.  You have a skin tear which was repaired using 5 sutures.  These will dissolve on their own.  Please follow-up with your primary care doctor for evaluation of your wound, however you not will need to have your sutures removed as they will dissolve.  Please dress the wound with a clean dressing daily with antibiotic ointment.  He may shower like normal but please do not submerge your arm in water.  Return to the ER if there is any puslike drainage from the area, redness streaking up or down your arm, fevers, chills, worsening pain, or any other new severe symptom.

## 2021-03-21 NOTE — ED Notes (Signed)
Pt verbalized understanding of d/c instructions, meds and followup care. Denies questions. VSS, no distress noted. W/C to exit with all belongings.  

## 2021-03-21 NOTE — ED Provider Notes (Signed)
[facility] EMERGENCY DEPARTMENT Provider Note   CSN: [id number] Arrival date & time: 03/21/21  1028     History Chief Complaint  Patient presents with   Laceration    Christina [name] is a 70 y.o. female who presents with concern for laceration to the right upper arm.  Patient states that she was reaching to get something out of an upper cabinet when she lost her balance and pulled her arm down against the corner of an open cabinet door resulting in her laceration.  She states that she did not fall or other lowered her self immediately to the ground on her knees.  She states that it was bleeding significantly so she called EMS.  She is not anticoagulated and denies any head trauma, blurred or double vision, nausea, vomiting, lightheadedness, or syncope since that time.  She endorses history of skin tears in the past.  I personally read the patient medical records.  She is history of breast cancer, adrenal insufficiency.   HPI     Past Medical History:  Diagnosis Date   Abrasion of right leg 12/17/2014   Adrenal insufficiency (HCC)    Breast cancer of upper-outer quadrant of left female breast ([place]) 12/07/2014   Cataract, immature    bilateral   Dental bridge present    upper - x 2   Dental crowns present    Hypothyroidism    Personal history of radiation therapy    Prediabetes     Patient Active Problem List   Diagnosis Date Noted   Hypothyroidism 01/27/2021   Prediabetes 01/27/2021   Arthritis of right hip 09/17/2019   Labral tear of right hip joint 08/06/2019   Gluteal tendonitis of right buttock 05/22/2019   Tear of left gluteus medius tendon 01/04/2019   Arthritis of left hip 03/20/2018   Greater trochanteric bursitis of right hip 06/13/2017   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 11/29/2016   Breast cancer of upper-outer quadrant of left female breast ([place]) 12/07/2014   Acute recurrent sinusitis 01/17/2013    Past Surgical History:   Procedure Laterality Date   ABDOMINOPLASTY     BALLOON SINUPLASTY     BREAST LUMPECTOMY Left 2016   BREAST LUMPECTOMY WITH RADIOACTIVE SEED AND SENTINEL LYMPH NODE BIOPSY Left 12/22/2014   Procedure: BREAST LUMPECTOMY WITH RADIOACTIVE SEED AND SENTINEL LYMPH NODE BIOPSY;  Surgeon: Stark Klein, MD;  Location: [facility];  Service: General;  Laterality: Left;   BUNIONECTOMY Right    HAMMER TOE SURGERY Bilateral    RHINOPLASTY     SEPTOPLASTY       OB History   No obstetric history on file.     Family History  Problem Relation Age of Onset   Diabetes Mother    Diabetes Father    Cancer Maternal Uncle        pancreatic cancer    Diabetes Paternal Grandmother    Diabetes Paternal Grandfather    Breast cancer Sister     Social History   Tobacco Use   Smoking status: Former    Packs/day: 0.00    Years: 0.00    Pack years: 0.00    Types: Cigarettes    Quit date: 05/01/1980    Years since quitting: 40.9   Smokeless tobacco: Never  Vaping Use   Vaping Use: Never used  Substance Use Topics   Alcohol use: Yes    Alcohol/week: 0.0 standard drinks    Comment: occasionally  Drug use: No    Home Medications Prior to Admission medications   Medication Sig Start Date End Date Taking? Authorizing Provider  albuterol (PROVENTIL HFA;VENTOLIN HFA) 108 (90 BASE) MCG/ACT inhaler Inhale into the lungs every 6 (six) hours as needed for wheezing or shortness of breath.    [provider]  cetirizine (ZYRTEC) 10 MG tablet Take 10 mg by mouth at bedtime.    [provider]  clonazePAM (KLONOPIN) 0.5 MG tablet Take 1 tablet (0.5 mg total) by mouth at bedtime. 01/27/21   Ailene Ards, NP  cycloSPORINE (RESTASIS) 0.05 % ophthalmic emulsion Place 1 drop into both eyes 2 (two) times daily. 05/22/18   [provider]  fluticasone (FLONASE) 50 MCG/ACT nasal spray Place 1 spray into both nostrils at bedtime. 02/23/15   [provider]  ibuprofen  (ADVIL,MOTRIN) 200 MG tablet Take 600 mg by mouth every 4 (four) hours as needed for fever, headache, mild pain, moderate pain or cramping.    [provider]  liothyronine (CYTOMEL) 25 MCG tablet Take 50 mcg by mouth daily.    [provider]  melatonin 5 MG TABS Take 5 mg by mouth at bedtime as needed for sleep. f    [provider]  metFORMIN (GLUCOPHAGE-XR) 500 MG 24 hr tablet Take 500 mg by mouth daily. 02/08/15   [provider]  montelukast (SINGULAIR) 10 MG tablet Take 1 tablet (10 mg total) by mouth at bedtime. 03/01/20   Marrian Salvage, FNP  traMADol (ULTRAM) 50 MG tablet Take 1 tablet (50 mg total) by mouth every 6 (six) hours as needed for moderate pain. 09/06/20   Gregor Hams, MD    Allergies    Other, Celery oil, Doxycycline, Rye grass flower pollen extract [gramineae pollens], Sulfa antibiotics, Tea, and Adhesive [tape]  Review of Systems   Review of Systems  Constitutional: Negative.   HENT: Negative.    Respiratory: Negative.    Cardiovascular: Negative.   Gastrointestinal: Negative.   Genitourinary: Negative.   Musculoskeletal: Negative.   Skin:  Positive for wound.  Neurological: Negative.    Physical Exam Updated Vital Signs BP (!) 164/95 (BP Location: Left Arm)   Pulse 95   Temp 98.5 F (36.9 C) (Oral)   Resp 18   SpO2 99%   Physical Exam Vitals and nursing note reviewed.  Constitutional:      Appearance: She is not ill-appearing or toxic-appearing.  HENT:     Head: Normocephalic and atraumatic.     Nose: Nose normal. No congestion.  Eyes:     General: No scleral icterus.       Right eye: No discharge.        Left eye: No discharge.     Extraocular Movements: Extraocular movements intact.     Conjunctiva/sclera: Conjunctivae normal.     Pupils: Pupils are equal, round, and reactive to light.  Cardiovascular:     Rate and Rhythm: Normal rate.     Heart sounds: Normal heart sounds. No murmur  heard. Pulmonary:     Effort: Pulmonary effort is normal.  Musculoskeletal:       Arms:       Legs:  Skin:    General: Skin is warm and dry.     Capillary Refill: Capillary refill takes less than 2 seconds.     Findings: Laceration present.  Neurological:     General: No focal deficit present.     Mental Status: She is alert.  Psychiatric:  Mood and Affect: Mood normal.    ED Results / Procedures / Treatments   Labs (all labs ordered are listed, but only abnormal results are displayed) Labs Reviewed - No data to display  EKG None  Radiology No results found.  Procedures .Marland KitchenLaceration Repair  Date/Time: 03/21/2021 1:03 PM Performed by: Emeline Darling, PA-C Authorized by: Emeline Darling, PA-C   Consent:    Consent obtained:  Verbal   Consent given by:  Patient   Risks discussed:  Infection, need for additional repair, pain, poor cosmetic result and poor wound healing   Alternatives discussed:  No treatment and delayed treatment Universal protocol:    Procedure explained and questions answered to patient or proxy's satisfaction: yes     Relevant documents present and verified: yes     Test results available: yes     Imaging studies available: yes     Required blood products, implants, devices, and special equipment available: yes     Site/side marked: yes     Immediately prior to procedure, a time out was called: yes     Patient identity confirmed:  Verbally with patient Anesthesia:    Anesthesia method:  Local infiltration   Local anesthetic:  Lidocaine 1% WITH epi Laceration details:    Location:  Shoulder/arm   Shoulder/arm location:  R upper arm   Length (cm):  9 Exploration:    Limited defect created (wound extended): no     Hemostasis achieved with:  Epinephrine and direct pressure   Wound exploration: wound explored through full range of motion and entire depth of wound visualized     Wound extent: no foreign bodies/material noted,  no tendon damage noted and no underlying fracture noted   Treatment:    Area cleansed with:  Saline   Amount of cleaning:  Extensive   Irrigation solution:  Sterile saline   Debridement:  None Skin repair:    Repair method:  Sutures   Suture size:  5-0   Suture material:  Fast-absorbing gut   Suture technique:  Simple interrupted   Number of sutures:  5 Approximation:    Approximation:  Close Repair type:    Repair type:  Simple Post-procedure details:    Dressing:  Antibiotic ointment and non-adherent dressing   Procedure completion:  Tolerated well, no immediate complications Comments:     Laceration is predominantly skin tear, however was repaired with suture given oozing blood and length of injury   Medications Ordered in ED Medications  Tdap (BOOSTRIX) injection 0.5 mL (0.5 mLs Intramuscular Given 03/21/21 1112)  lidocaine-EPINEPHrine (XYLOCAINE W/EPI) 1 %-1:100000 (with pres) injection 20 mL (20 mLs Infiltration Given by Other 03/21/21 1113)    ED Course  I have reviewed the triage vital signs and the nursing notes.  Pertinent labs & imaging results that were available during my care of the patient were reviewed by me and considered in my medical decision making (see chart for details).    MDM Rules/Calculators/A&P                         70 year old female presents with concern for laceration to the right upper arm.  She is not anticoagulated.  Hypertensive intake and mildly tachycardic.  Cardiopulmonary exam is normal at this time.  Abdominal exam is benign.  Patient is 9 cm laceration/skin tear to the right upper arm, very superficial, however the proximal portion of the wound is using blood and bleeding through multiple  dressings.  Do feel suture repair is warranted. Boostrix updated from triage.  Wound repaired as above and dressed with antibiotic ointment.  Patient tolerated procedure very well.  No further work-up warranted near this time.  No indication for  antibiotics at this time as it was a very clean wound and patient has good outpatient follow-up.  Recommend clean dressings daily with antibiotic ointment.  [name] voiced understanding of her medical evaluation and treatment plan.  Her questions was answered to her expressed satisfaction.  Return precautions given.  Patient is well-appearing, stable, and appropriate for discharge at this time.   This chart was dictated using voice recognition software, Dragon. Despite the best efforts of this provider to proofread and correct errors, errors may still occur which can change documentation meaning.  Final Clinical Impression(s) / ED Diagnoses Final diagnoses:  Laceration of right upper extremity, initial encounter    Rx / DC Orders ED Discharge Orders     None        Aura Dials 03/21/21 1620    Hayden Rasmussen, MD 03/22/21 1231

## 2021-03-21 NOTE — ED Provider Notes (Signed)
Emergency Medicine Provider Triage Evaluation Note  Christina [name] , a 70 y.o. female  was evaluated in triage.  Pt complains of laceration.  Review of Systems  Positive: Lac to R upper arm Negative: Head injury, loc, numbness  Physical Exam  BP (!) 164/95 (BP Location: Left Arm)   Pulse (!) 107   Temp 98.8 F (37.1 C)   Resp 16   SpO2 100%  Gen:   Awake, no distress   Resp:  Normal effort  MSK:   Moves extremities without difficulty  Other:  Dressing soaked in blood noted to RUE, sensation intact distally  Medical Decision Making  Medically screening exam initiated at 10:34 AM.  Appropriate orders placed.  Christina [name] was informed that the remainder of the evaluation will be completed by another provider, this initial triage assessment does not replace that evaluation, and the importance of remaining in the ED until their evaluation is complete.  Pt slipped and fell in the kitchen this AM, striking RUE against an open cabinet door and suffered a laceration.  Unsure last tetanus, no numbness.  No head injury   Christina Moras, PA-C 03/21/21 1036    Christina Bo, MD 03/21/21 1726

## 2021-03-30 ENCOUNTER — Ambulatory Visit: Payer: PPO | Admitting: Family Medicine

## 2021-04-11 ENCOUNTER — Other Ambulatory Visit: Payer: Self-pay | Admitting: Family Medicine

## 2021-04-11 ENCOUNTER — Other Ambulatory Visit: Payer: Self-pay | Admitting: Nurse Practitioner

## 2021-04-11 DIAGNOSIS — G4700 Insomnia, unspecified: Secondary | ICD-10-CM

## 2021-04-11 DIAGNOSIS — F419 Anxiety disorder, unspecified: Secondary | ICD-10-CM

## 2021-04-12 MED ORDER — CLONAZEPAM 0.5 MG PO TABS: 0.5000 mg | ORAL_TABLET | Freq: Every day | ORAL | 0 refills | Status: AC

## 2021-04-12 MED ORDER — TRAMADOL HCL 50 MG PO TABS: 50.0000 mg | ORAL_TABLET | Freq: Four times a day (QID) | ORAL | 0 refills | Status: AC | PRN

## 2021-04-14 ENCOUNTER — Telehealth: Payer: Self-pay | Admitting: Family Medicine

## 2021-04-14 ENCOUNTER — Encounter: Payer: Self-pay | Admitting: Family Medicine

## 2021-04-14 NOTE — Telephone Encounter (Signed)
Letter sent via MyChart and a copy is printed and available at the front desk if needed.

## 2021-04-14 NOTE — Telephone Encounter (Signed)
Pt requesting a letter from Dr. Georgina Snell to her current apartment complex stating that she needs to move to assisted living for her health & safety. (She is breaking her leave in order to move)  OK to send via [place]

## 2021-04-21 DIAGNOSIS — R41841 Cognitive communication deficit: Secondary | ICD-10-CM | POA: Diagnosis not present

## 2021-04-21 DIAGNOSIS — M25552 Pain in left hip: Secondary | ICD-10-CM | POA: Diagnosis not present

## 2021-04-26 ENCOUNTER — Telehealth: Payer: Self-pay | Admitting: Family Medicine

## 2021-04-26 NOTE — Telephone Encounter (Signed)
Called back and clarified. I am not PCP but tried to help with the paperwork. Christina [name] is listed as PCP. New PCP in the facility should double check all these medicines.

## 2021-04-26 NOTE — Telephone Encounter (Signed)
[facility] called, pt has moved to their asst living.  Pharmacy has questions about the dosage of the liothyronine and the metformin.  I advised that we are not the prescribers of all her meds, but per pharmacy Dr. Georgina Snell signed off on her med list and allowed for 1 year of refills, so they will contact us.  Needs clarity on the 2 above mentioned meds and confirmation as to who will be managing her meds.  [phone]

## 2021-05-08 ENCOUNTER — Other Ambulatory Visit: Payer: Self-pay | Admitting: Family

## 2021-05-19 ENCOUNTER — Ambulatory Visit: Payer: PPO | Admitting: Nurse Practitioner

## 2021-06-09 ENCOUNTER — Ambulatory Visit: Payer: PPO | Admitting: Family Medicine

## 2021-10-10 IMAGING — CT CT HEAD W/O CM
3 series · 14 of 47 positions shown, 16 images · non-contrast
Comparison: No priors.

CLINICAL DATA: 69-year-old female with history of trauma from a
fall with contusion to the left maxillary region.

EXAM:
CT HEAD WITHOUT CONTRAST
CT MAXILLOFACIAL WITHOUT CONTRAST
CT CERVICAL SPINE WITHOUT CONTRAST
TECHNIQUE: Multidetector CT imaging of the head, cervical spine, and
maxillofacial structures were performed using the standard protocol
without intravenous contrast. Multiplanar CT image reconstructions
of the cervical spine and maxillofacial structures were also
generated.

[Series 3: head wo · axial · 0.41mm/px · z∈[+1429,+1554]mm · 8 of 31 slices shown, 10 images]
[im 3/31  brain]
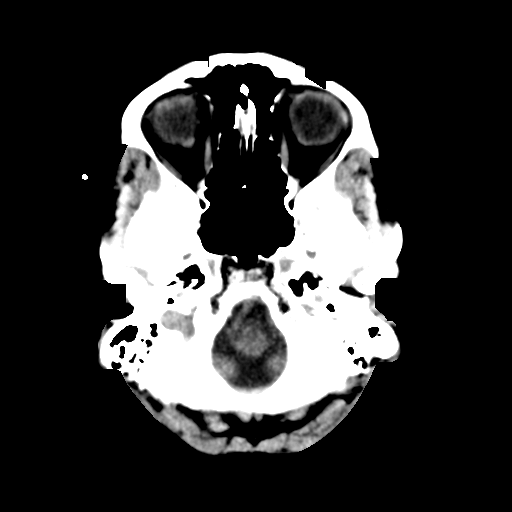
[im 3/31  bone]
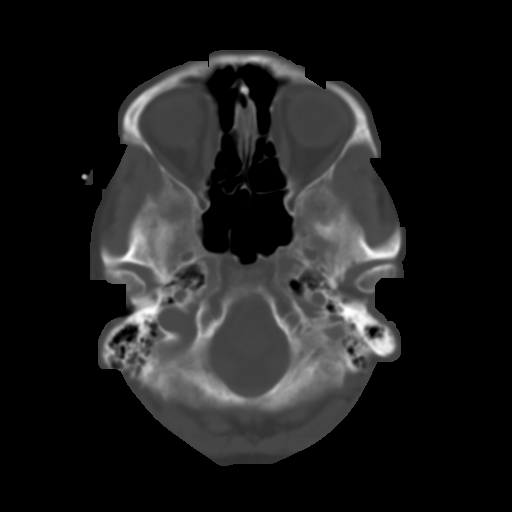
[im 7/31  brain]
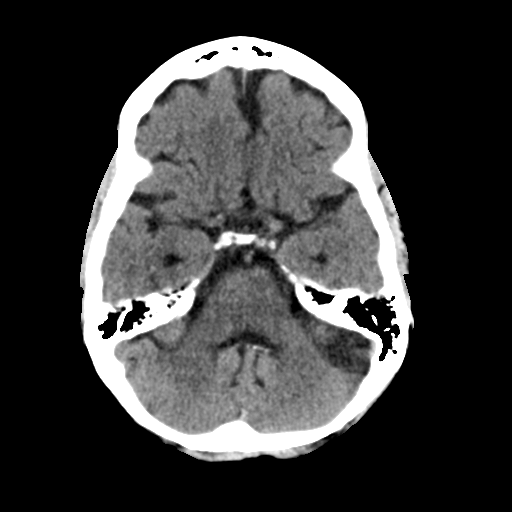
[im 10/31  brain]
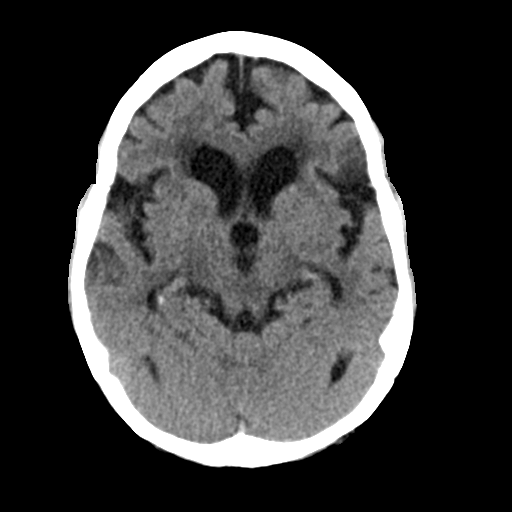
[im 14/31  brain]
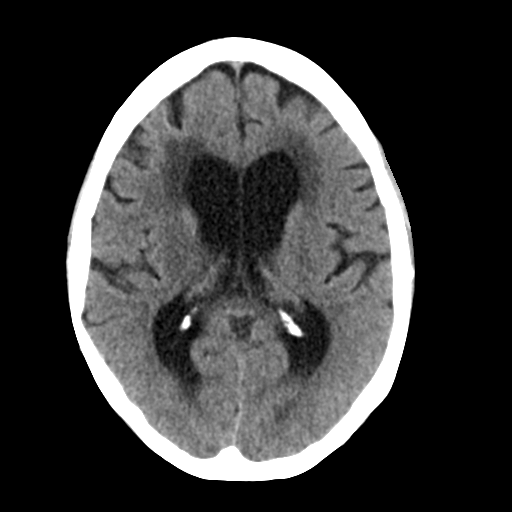
[im 17/31  brain]
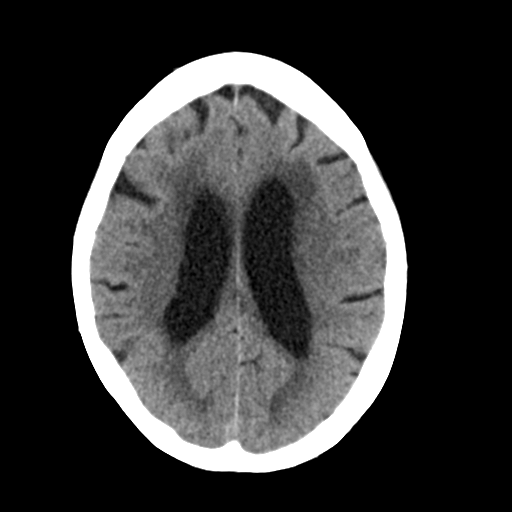
[im 17/31  bone]
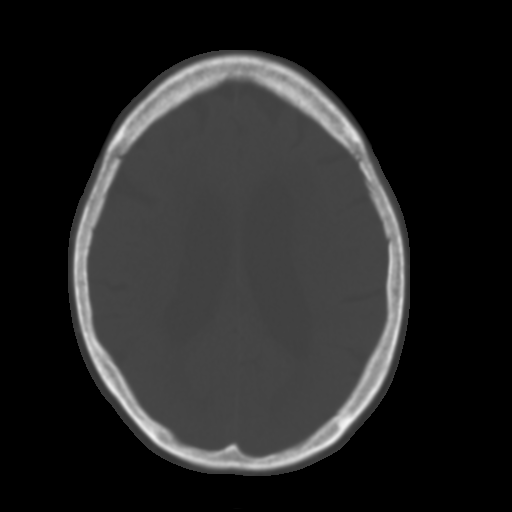
[im 21/31  brain]
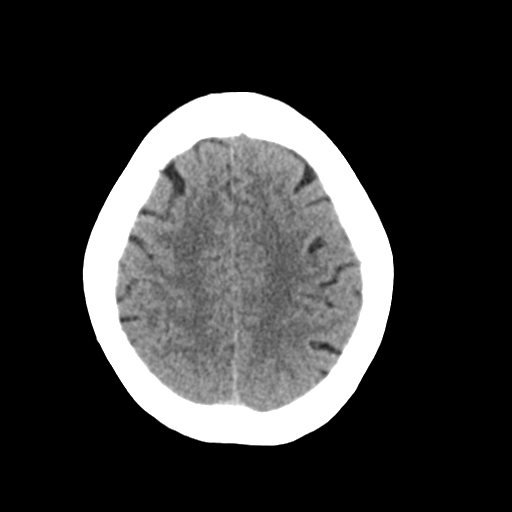
[im 24/31  brain]
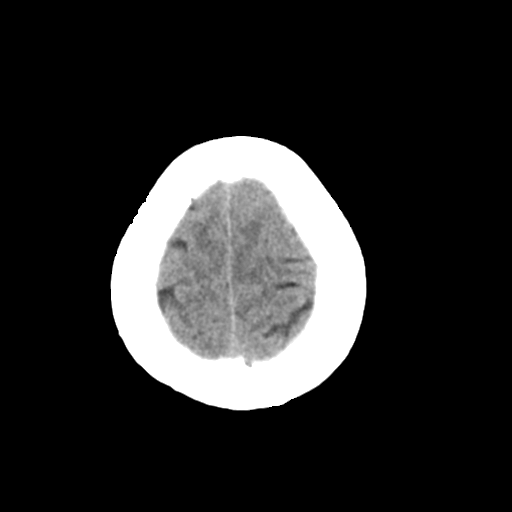
[im 28/31  brain]
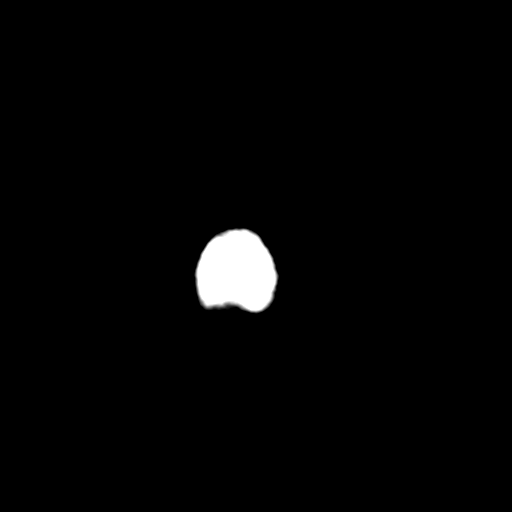

[Series 6: coronal soft tissue · coronal · 0.29mm/px · 3 of 67 slices shown]
[im 23/67  brain]
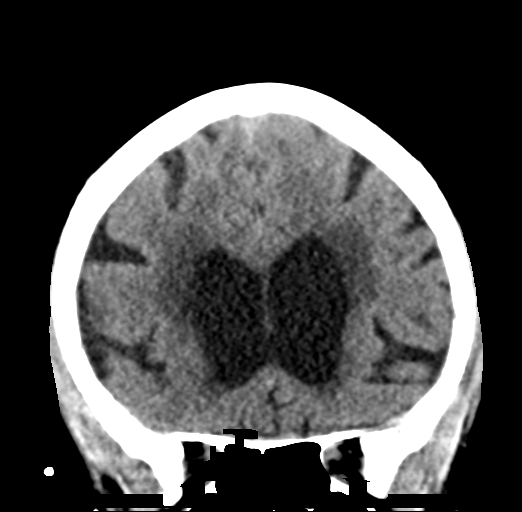
[im 30/67  brain]
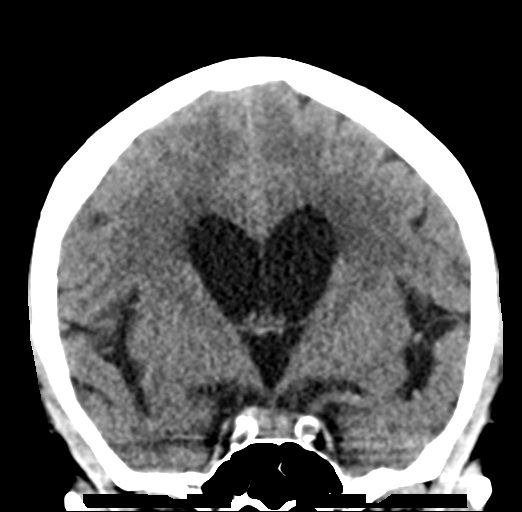
[im 37/67  brain]
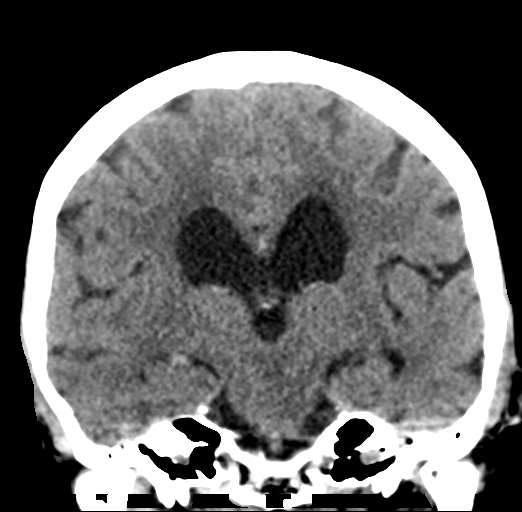

[Series 7: sagittal soft tissue · sagittal · 0.29mm/px · 3 of 52 slices shown]
[im 18/52  brain]
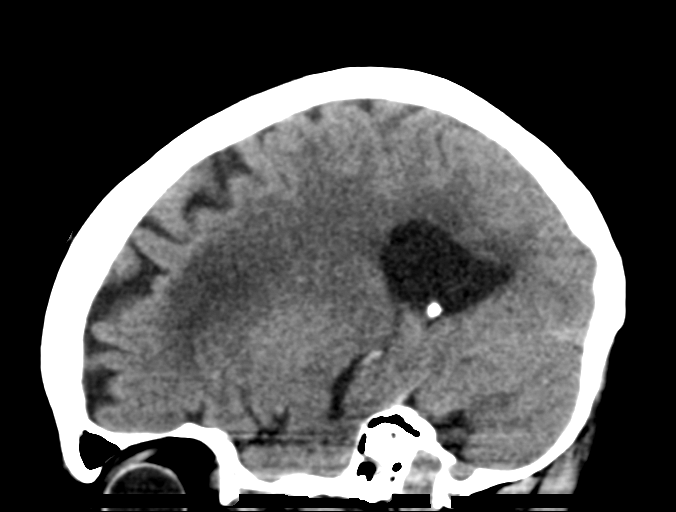
[im 26/52  brain]
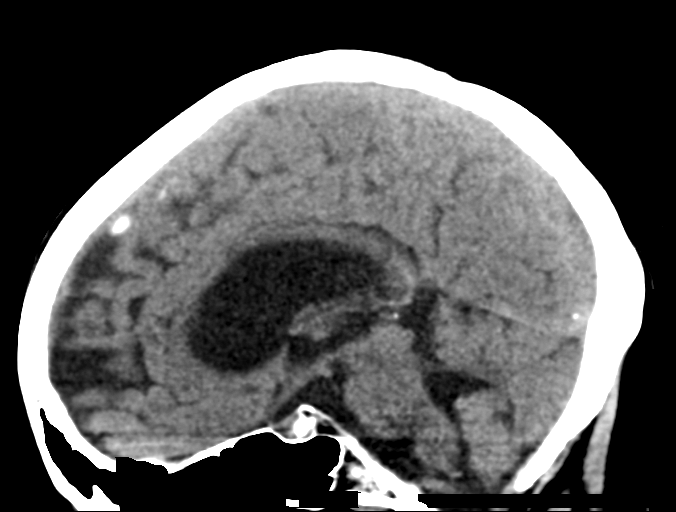
[im 35/52  brain]
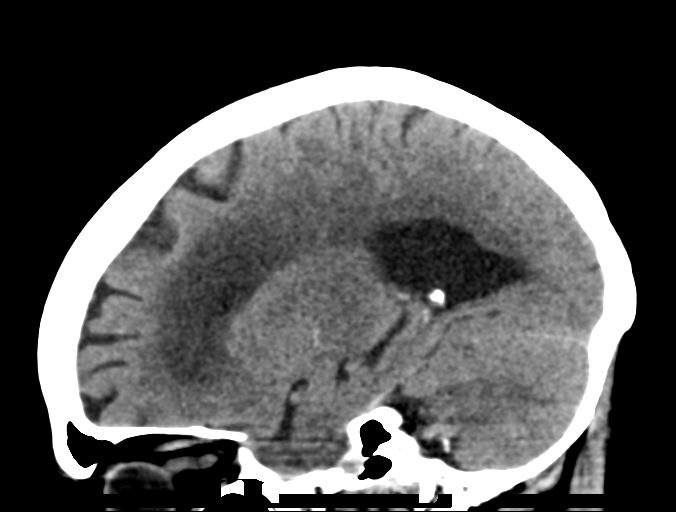

[14 of 47 positions shown; findings below may reference images not displayed]

FINDINGS: CT HEAD FINDINGS

Brain: Mild-to-moderate cerebral and mild cerebellar atrophy with ex
vacuo dilatation of the ventricular system. Patchy and confluent
areas of decreased attenuation are noted throughout the deep and
periventricular white matter of the cerebral hemispheres
bilaterally, compatible with chronic microvascular ischemic disease.
No evidence of acute infarction, hemorrhage, hydrocephalus,
extra-axial collection or mass lesion/mass effect.

Vascular: No hyperdense vessel or unexpected calcification.

Skull: Normal. Negative for fracture or focal lesion.

Other: None.

CT MAXILLOFACIAL FINDINGS

Osseous: No fracture or mandibular dislocation. No destructive
process.

Orbits: Negative. No traumatic or inflammatory finding.

Sinuses: Clear.

Soft tissues: Some soft tissue calcifications are noted in the right
pre maxillary region, likely sequela of remote trauma.

CT CERVICAL SPINE FINDINGS

Alignment: Normal.

Skull base and vertebrae: No acute fracture. No primary bone lesion
or focal pathologic process.

Soft tissues and spinal canal: No prevertebral fluid or swelling. No
visible canal hematoma.

Disc levels: Mild degenerative disc disease with a Schmorl's node in
the superior endplate of the T1 vertebral body.

Upper chest: Unremarkable.

Other: None.
IMPRESSION: 1. No evidence of significant acute traumatic injury to the skull,
brain, facial bones or cervical spine.
2. Mild to moderate cerebral and mild cerebellar atrophy with ex
vacuo dilatation of the ventricular system.
3. Extensive chronic microvascular ischemic changes in the cerebral
white matter, as above.
4. Additional incidental findings, as above.

## 2021-10-10 IMAGING — CT CT CERVICAL SPINE W/O CM
3 of 4 series · 12 of 33 positions shown, 14 images · non-contrast
Comparison: No priors.

CLINICAL DATA: 69-year-old female with history of trauma from a
fall with contusion to the left maxillary region.

EXAM:
CT HEAD WITHOUT CONTRAST
CT MAXILLOFACIAL WITHOUT CONTRAST
CT CERVICAL SPINE WITHOUT CONTRAST
TECHNIQUE: Multidetector CT imaging of the head, cervical spine, and
maxillofacial structures were performed using the standard protocol
without intravenous contrast. Multiplanar CT image reconstructions
of the cervical spine and maxillofacial structures were also
generated.

[Series 6: orthogonal axials · axial · 0.21mm/px · z∈[+1283,+1390]mm · 4 of 88 slices shown, 5 images]
[im 13/88  soft-tissue]
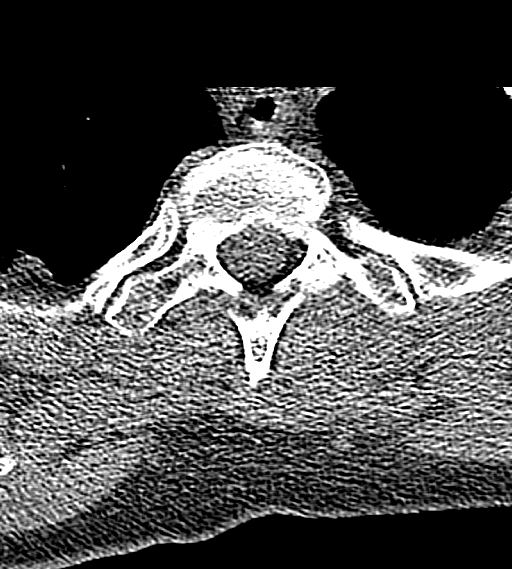
[im 13/88  bone]
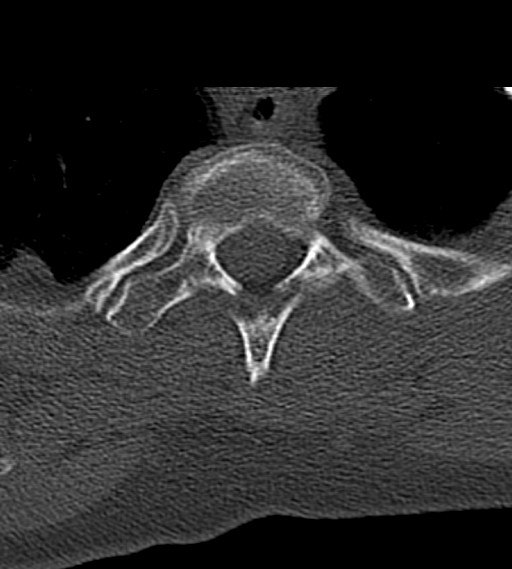
[im 38/88  bone]
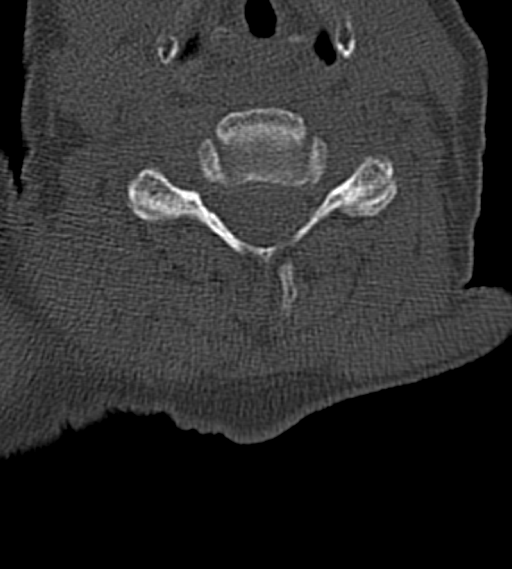
[im 50/88  bone]
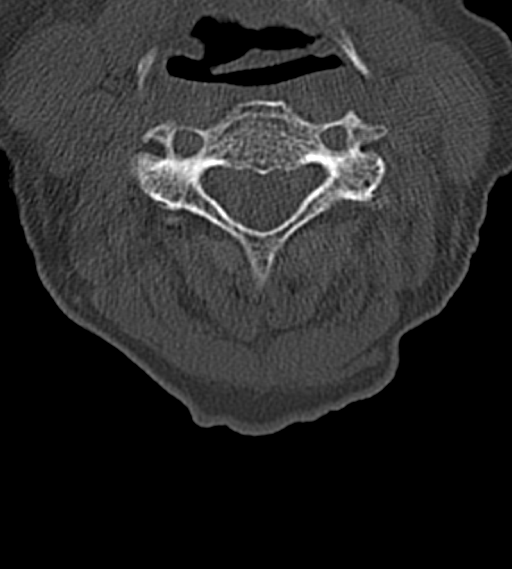
[im 75/88  bone]
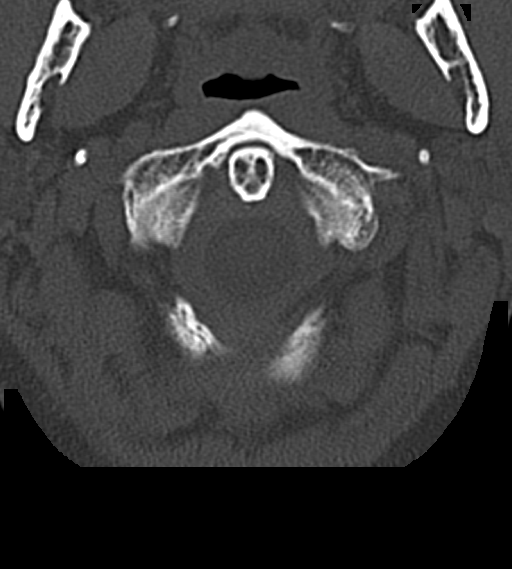

[Series 7: coronal bone · coronal · 0.21mm/px · 3 of 61 slices shown]
[im 16/61  bone]
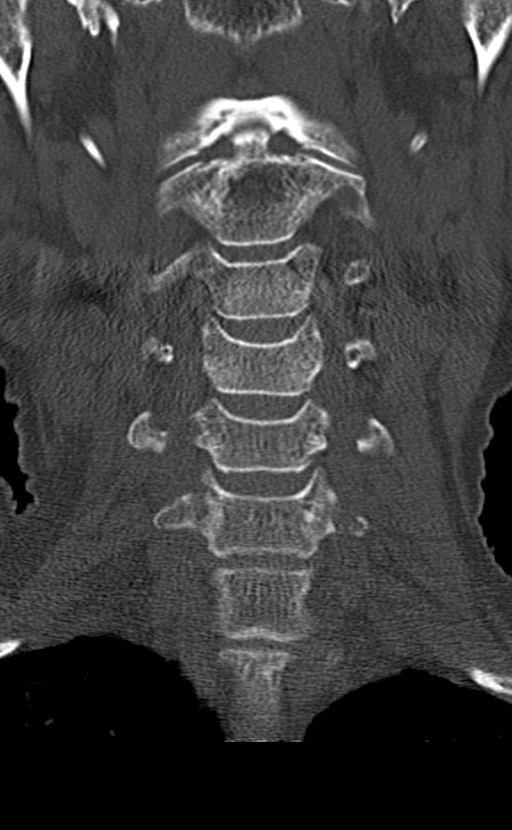
[im 26/61  bone]
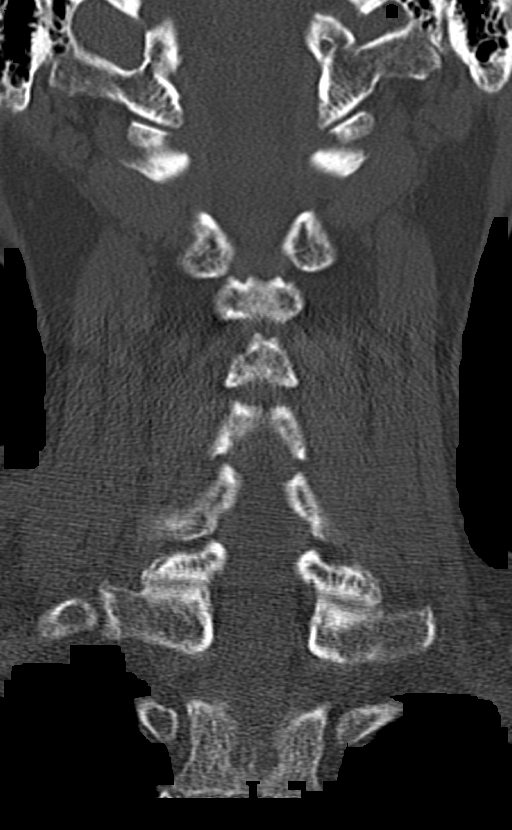
[im 35/61  bone]
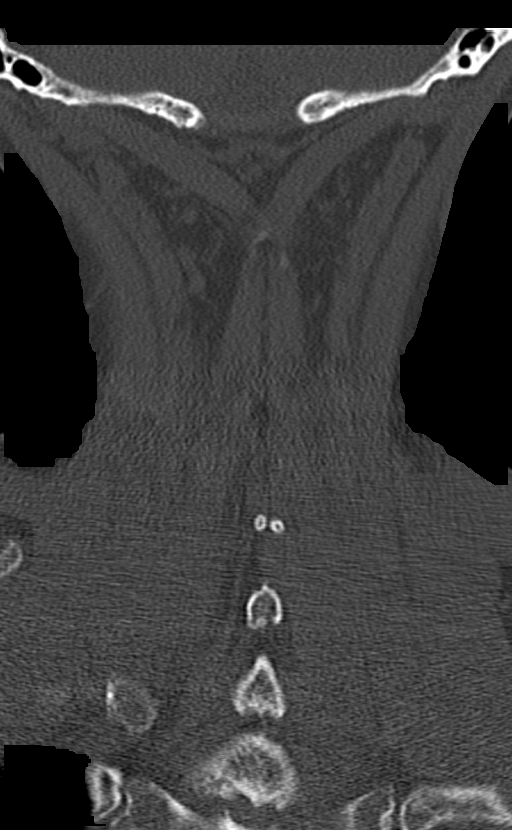

[Series 8: sagittal bone · sagittal · 0.23mm/px · 5 of 55 slices shown, 6 images]
[im 19/55  bone]
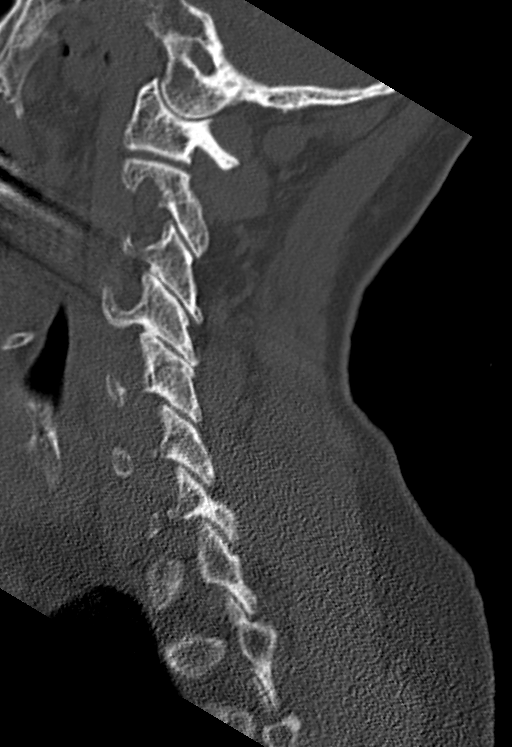
[im 23/55  bone]
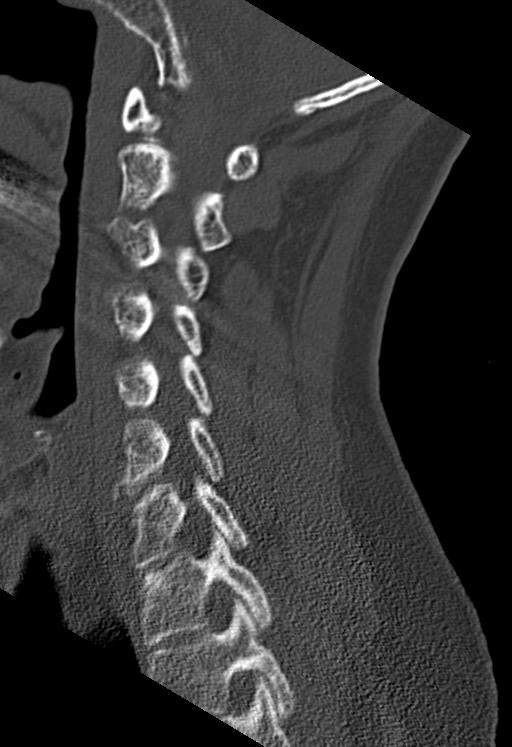
[im 28/55  soft-tissue]
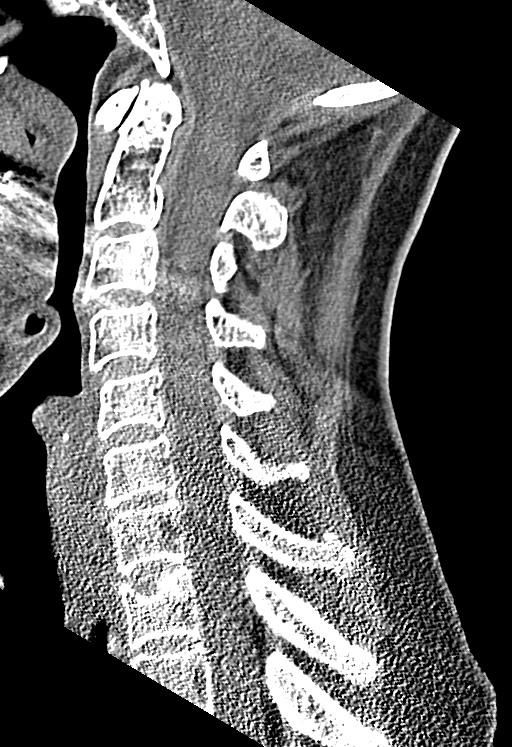
[im 28/55  bone]
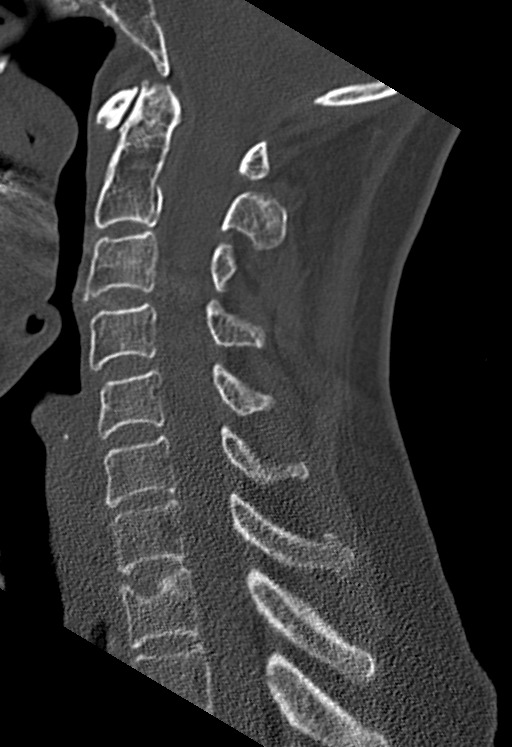
[im 32/55  bone]
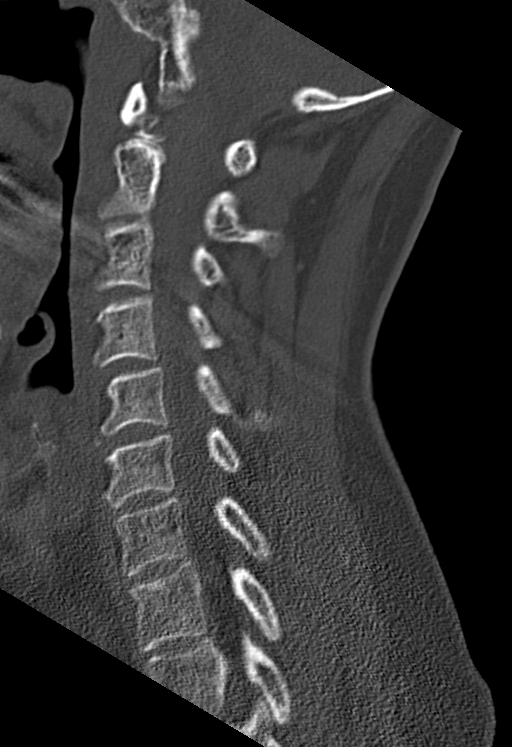
[im 37/55  bone]
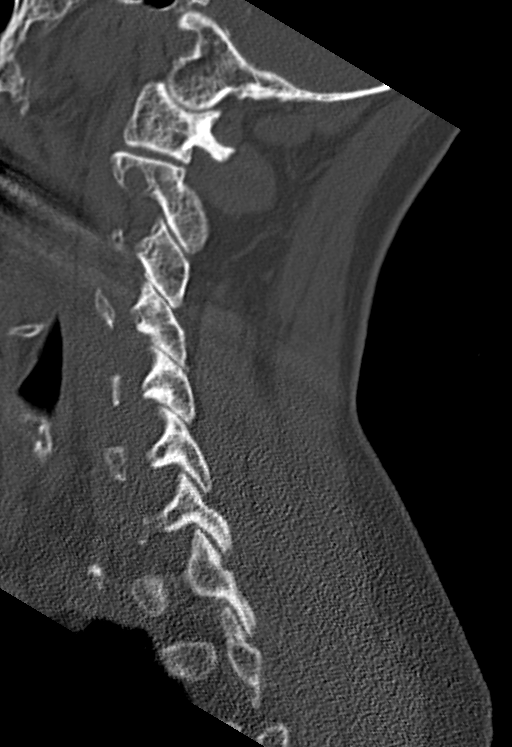

[12 of 33 positions shown; findings below may reference images not displayed]

FINDINGS: CT HEAD FINDINGS

Brain: Mild-to-moderate cerebral and mild cerebellar atrophy with ex
vacuo dilatation of the ventricular system. Patchy and confluent
areas of decreased attenuation are noted throughout the deep and
periventricular white matter of the cerebral hemispheres
bilaterally, compatible with chronic microvascular ischemic disease.
No evidence of acute infarction, hemorrhage, hydrocephalus,
extra-axial collection or mass lesion/mass effect.

Vascular: No hyperdense vessel or unexpected calcification.

Skull: Normal. Negative for fracture or focal lesion.

Other: None.

CT MAXILLOFACIAL FINDINGS

Osseous: No fracture or mandibular dislocation. No destructive
process.

Orbits: Negative. No traumatic or inflammatory finding.

Sinuses: Clear.

Soft tissues: Some soft tissue calcifications are noted in the right
pre maxillary region, likely sequela of remote trauma.

CT CERVICAL SPINE FINDINGS

Alignment: Normal.

Skull base and vertebrae: No acute fracture. No primary bone lesion
or focal pathologic process.

Soft tissues and spinal canal: No prevertebral fluid or swelling. No
visible canal hematoma.

Disc levels: Mild degenerative disc disease with a Schmorl's node in
the superior endplate of the T1 vertebral body.

Upper chest: Unremarkable.

Other: None.
IMPRESSION: 1. No evidence of significant acute traumatic injury to the skull,
brain, facial bones or cervical spine.
2. Mild to moderate cerebral and mild cerebellar atrophy with ex
vacuo dilatation of the ventricular system.
3. Extensive chronic microvascular ischemic changes in the cerebral
white matter, as above.
4. Additional incidental findings, as above.

## 2023-05-02 DEATH — deceased
